# Patient Record
Sex: Female | Born: 1955 | Race: White | Hispanic: No | Marital: Married | Smoking: Former smoker
Health system: Southern US, Community
[De-identification: ages and names within clinical notes are randomized; demographics above are authoritative.]

## PROBLEM LIST (undated history)

## (undated) DIAGNOSIS — R011 Cardiac murmur, unspecified: Secondary | ICD-10-CM

## (undated) DIAGNOSIS — I639 Cerebral infarction, unspecified: Secondary | ICD-10-CM

## (undated) DIAGNOSIS — J449 Chronic obstructive pulmonary disease, unspecified: Secondary | ICD-10-CM

## (undated) DIAGNOSIS — N390 Urinary tract infection, site not specified: Secondary | ICD-10-CM

## (undated) DIAGNOSIS — R0600 Dyspnea, unspecified: Secondary | ICD-10-CM

## (undated) DIAGNOSIS — F32A Depression, unspecified: Secondary | ICD-10-CM

## (undated) DIAGNOSIS — F419 Anxiety disorder, unspecified: Secondary | ICD-10-CM

## (undated) DIAGNOSIS — I10 Essential (primary) hypertension: Secondary | ICD-10-CM

## (undated) DIAGNOSIS — S52501A Unspecified fracture of the lower end of right radius, initial encounter for closed fracture: Secondary | ICD-10-CM

## (undated) DIAGNOSIS — R413 Other amnesia: Secondary | ICD-10-CM

## (undated) DIAGNOSIS — R569 Unspecified convulsions: Secondary | ICD-10-CM

## (undated) HISTORY — PX: CHOLECYSTECTOMY: SHX55

## (undated) HISTORY — PX: NEPHRECTOMY: SHX65

## (undated) HISTORY — PX: BREAST SURGERY: SHX581

## (undated) HISTORY — PX: ABDOMINAL HYSTERECTOMY: SHX81

---

## 2000-09-28 ENCOUNTER — Emergency Department (HOSPITAL_COMMUNITY): Admission: EM | Admit: 2000-09-28 | Discharge: 2000-09-28 | Payer: Self-pay | Admitting: Emergency Medicine

## 2000-09-28 ENCOUNTER — Encounter: Payer: Self-pay | Admitting: Emergency Medicine

## 2002-04-08 ENCOUNTER — Ambulatory Visit (HOSPITAL_COMMUNITY): Admission: RE | Admit: 2002-04-08 | Discharge: 2002-04-08 | Payer: Self-pay | Admitting: Family Medicine

## 2002-04-08 ENCOUNTER — Encounter: Payer: Self-pay | Admitting: Family Medicine

## 2004-06-21 ENCOUNTER — Emergency Department (HOSPITAL_COMMUNITY): Admission: EM | Admit: 2004-06-21 | Discharge: 2004-06-21 | Payer: Self-pay | Admitting: Emergency Medicine

## 2005-01-25 ENCOUNTER — Emergency Department (HOSPITAL_COMMUNITY): Admission: EM | Admit: 2005-01-25 | Discharge: 2005-01-25 | Payer: Self-pay | Admitting: Emergency Medicine

## 2005-06-24 ENCOUNTER — Ambulatory Visit: Payer: Self-pay | Admitting: Internal Medicine

## 2005-06-26 ENCOUNTER — Ambulatory Visit: Payer: Self-pay | Admitting: Internal Medicine

## 2005-06-29 ENCOUNTER — Inpatient Hospital Stay (HOSPITAL_COMMUNITY): Admission: EM | Admit: 2005-06-29 | Discharge: 2005-06-30 | Payer: Self-pay | Admitting: Emergency Medicine

## 2005-06-30 ENCOUNTER — Ambulatory Visit: Payer: Self-pay | Admitting: *Deleted

## 2005-07-29 ENCOUNTER — Ambulatory Visit: Payer: Self-pay | Admitting: Internal Medicine

## 2007-09-13 ENCOUNTER — Emergency Department (HOSPITAL_COMMUNITY): Admission: EM | Admit: 2007-09-13 | Discharge: 2007-09-13 | Payer: Self-pay | Admitting: Emergency Medicine

## 2008-06-09 ENCOUNTER — Encounter (INDEPENDENT_AMBULATORY_CARE_PROVIDER_SITE_OTHER): Payer: Self-pay | Admitting: *Deleted

## 2009-06-28 ENCOUNTER — Inpatient Hospital Stay (HOSPITAL_COMMUNITY): Admission: EM | Admit: 2009-06-28 | Discharge: 2009-07-02 | Payer: Self-pay | Admitting: Emergency Medicine

## 2009-07-26 ENCOUNTER — Emergency Department (HOSPITAL_COMMUNITY): Admission: EM | Admit: 2009-07-26 | Discharge: 2009-07-26 | Payer: Self-pay | Admitting: Emergency Medicine

## 2009-08-20 ENCOUNTER — Ambulatory Visit (HOSPITAL_COMMUNITY): Admission: RE | Admit: 2009-08-20 | Discharge: 2009-08-20 | Payer: Self-pay | Admitting: Pulmonary Disease

## 2009-10-15 ENCOUNTER — Encounter (INDEPENDENT_AMBULATORY_CARE_PROVIDER_SITE_OTHER): Payer: Self-pay | Admitting: Pulmonary Disease

## 2009-10-15 ENCOUNTER — Ambulatory Visit: Payer: Self-pay | Admitting: Cardiology

## 2009-10-15 ENCOUNTER — Ambulatory Visit (HOSPITAL_COMMUNITY): Admission: RE | Admit: 2009-10-15 | Discharge: 2009-10-15 | Payer: Self-pay | Admitting: Pulmonary Disease

## 2010-02-15 ENCOUNTER — Ambulatory Visit (HOSPITAL_COMMUNITY)
Admission: RE | Admit: 2010-02-15 | Discharge: 2010-02-15 | Payer: Self-pay | Source: Home / Self Care | Admitting: Urology

## 2010-03-06 ENCOUNTER — Encounter
Admission: RE | Admit: 2010-03-06 | Discharge: 2010-03-06 | Payer: Self-pay | Source: Home / Self Care | Attending: Neurology | Admitting: Neurology

## 2010-05-06 ENCOUNTER — Emergency Department (HOSPITAL_COMMUNITY)
Admission: EM | Admit: 2010-05-06 | Discharge: 2010-05-06 | Disposition: A | Discharge status: Home / Self Care | Payer: Medicaid Other | Attending: Emergency Medicine | Admitting: Emergency Medicine

## 2010-05-06 ENCOUNTER — Emergency Department (HOSPITAL_COMMUNITY): Payer: Medicaid Other

## 2010-05-06 DIAGNOSIS — J449 Chronic obstructive pulmonary disease, unspecified: Secondary | ICD-10-CM | POA: Insufficient documentation

## 2010-05-06 DIAGNOSIS — Z794 Long term (current) use of insulin: Secondary | ICD-10-CM | POA: Insufficient documentation

## 2010-05-06 DIAGNOSIS — R42 Dizziness and giddiness: Secondary | ICD-10-CM | POA: Insufficient documentation

## 2010-05-06 DIAGNOSIS — R109 Unspecified abdominal pain: Secondary | ICD-10-CM | POA: Insufficient documentation

## 2010-05-06 DIAGNOSIS — Z79899 Other long term (current) drug therapy: Secondary | ICD-10-CM | POA: Insufficient documentation

## 2010-05-06 DIAGNOSIS — E785 Hyperlipidemia, unspecified: Secondary | ICD-10-CM | POA: Insufficient documentation

## 2010-05-06 DIAGNOSIS — R0789 Other chest pain: Secondary | ICD-10-CM | POA: Insufficient documentation

## 2010-05-06 DIAGNOSIS — J4489 Other specified chronic obstructive pulmonary disease: Secondary | ICD-10-CM | POA: Insufficient documentation

## 2010-05-06 LAB — DIFFERENTIAL
Basophils Absolute: 0 10*3/uL (ref 0.0–0.1)
Eosinophils Absolute: 0.1 10*3/uL (ref 0.0–0.7)
Eosinophils Relative: 1 % (ref 0–5)
Lymphocytes Relative: 35 % (ref 12–46)
Monocytes Absolute: 0.4 10*3/uL (ref 0.1–1.0)

## 2010-05-06 LAB — COMPREHENSIVE METABOLIC PANEL
BUN: 12 mg/dL (ref 6–23)
Calcium: 9.7 mg/dL (ref 8.4–10.5)
Glucose, Bld: 103 mg/dL — ABNORMAL HIGH (ref 70–99)
Sodium: 141 mEq/L (ref 135–145)
Total Protein: 5.7 g/dL — ABNORMAL LOW (ref 6.0–8.3)

## 2010-05-06 LAB — LIPASE, BLOOD: Lipase: 23 U/L (ref 11–59)

## 2010-05-06 LAB — CBC
HCT: 40.1 % (ref 36.0–46.0)
MCHC: 34.2 g/dL (ref 30.0–36.0)
Platelets: 161 10*3/uL (ref 150–400)
RDW: 12.7 % (ref 11.5–15.5)

## 2010-05-06 LAB — URINALYSIS, ROUTINE W REFLEX MICROSCOPIC
Bilirubin Urine: NEGATIVE
Ketones, ur: NEGATIVE mg/dL
Nitrite: NEGATIVE
Protein, ur: NEGATIVE mg/dL
Urobilinogen, UA: 0.2 mg/dL (ref 0.0–1.0)

## 2010-05-06 LAB — POCT CARDIAC MARKERS
CKMB, poc: <1 ng/mL — ABNORMAL LOW (ref 1.0–8.0)
Troponin i, poc: <0.05 ng/mL (ref 0.00–0.09)

## 2010-05-08 LAB — URINE CULTURE
Colony Count: NO GROWTH
Culture: NO GROWTH

## 2010-06-04 LAB — BASIC METABOLIC PANEL
CO2: 26 mEq/L (ref 19–32)
Calcium: 9.5 mg/dL (ref 8.4–10.5)
Chloride: 102 mEq/L (ref 96–112)
Creatinine, Ser: 0.7 mg/dL (ref 0.4–1.2)
Creatinine, Ser: 0.79 mg/dL (ref 0.4–1.2)
GFR calc Af Amer: >60 mL/min (ref >60)
Glucose, Bld: 96 mg/dL (ref 70–99)
Sodium: 139 mEq/L (ref 135–145)
Sodium: 133 mEq/L — ABNORMAL LOW (ref 135–145)

## 2010-06-04 LAB — BLOOD GAS, ARTERIAL
Acid-base deficit: 0.3 mmol/L (ref 0.0–2.0)
Bicarbonate: 24 mEq/L (ref 20.0–24.0)
O2 Content: 2 L/min
TCO2: 21.9 mmol/L (ref 0–100)
pCO2 arterial: 40.2 mmHg (ref 35.0–45.0)
pO2, Arterial: 91.8 mmHg (ref 80.0–100.0)

## 2010-06-04 LAB — DIFFERENTIAL
Basophils Absolute: 0 10*3/uL (ref 0.0–0.1)
Basophils Relative: 1 % (ref 0–1)
Eosinophils Absolute: 0 10*3/uL (ref 0.0–0.7)
Monocytes Absolute: 0.5 10*3/uL (ref 0.1–1.0)
Neutro Abs: 3.9 10*3/uL (ref 1.7–7.7)

## 2010-06-04 LAB — GLUCOSE, CAPILLARY
Glucose-Capillary: 127 mg/dL — ABNORMAL HIGH (ref 70–99)
Glucose-Capillary: 174 mg/dL — ABNORMAL HIGH (ref 70–99)
Glucose-Capillary: 255 mg/dL — ABNORMAL HIGH (ref 70–99)
Glucose-Capillary: 265 mg/dL — ABNORMAL HIGH (ref 70–99)
Glucose-Capillary: 276 mg/dL — ABNORMAL HIGH (ref 70–99)
Glucose-Capillary: 324 mg/dL — ABNORMAL HIGH (ref 70–99)
Glucose-Capillary: 94 mg/dL (ref 70–99)
Glucose-Capillary: 96 mg/dL (ref 70–99)

## 2010-06-04 LAB — URINALYSIS, ROUTINE W REFLEX MICROSCOPIC
Glucose, UA: NEGATIVE mg/dL
Hgb urine dipstick: NEGATIVE
Specific Gravity, Urine: 1.01 (ref 1.005–1.030)
pH: 5.5 (ref 5.0–8.0)

## 2010-06-04 LAB — CARDIAC PANEL(CRET KIN+CKTOT+MB+TROPI)
Total CK: 42 U/L (ref 7–177)
Troponin I: <0.01 ng/mL (ref 0.00–0.06)

## 2010-06-04 LAB — CBC
Hemoglobin: 13 g/dL (ref 12.0–15.0)
MCHC: 35.5 g/dL (ref 30.0–36.0)
RDW: 13.7 % (ref 11.5–15.5)

## 2010-06-05 LAB — GLUCOSE, CAPILLARY
Glucose-Capillary: 277 mg/dL — ABNORMAL HIGH (ref 70–99)
Glucose-Capillary: 501 mg/dL — ABNORMAL HIGH (ref 70–99)

## 2010-06-05 LAB — URINALYSIS, ROUTINE W REFLEX MICROSCOPIC
Glucose, UA: >1000 mg/dL — AB
Hgb urine dipstick: NEGATIVE
Specific Gravity, Urine: 1.01 (ref 1.005–1.030)
Urobilinogen, UA: 0.2 mg/dL (ref 0.0–1.0)
pH: 5 (ref 5.0–8.0)

## 2010-06-05 LAB — POCT CARDIAC MARKERS
CKMB, poc: <1 ng/mL — ABNORMAL LOW (ref 1.0–8.0)
Myoglobin, poc: 110 ng/mL (ref 12–200)
Troponin i, poc: <0.05 ng/mL (ref 0.00–0.09)

## 2010-06-05 LAB — URINE CULTURE

## 2010-06-05 LAB — CARDIAC PANEL(CRET KIN+CKTOT+MB+TROPI)
CK, MB: 1.5 ng/mL (ref 0.3–4.0)
Relative Index: INVALID (ref 0.0–2.5)
Total CK: 37 U/L (ref 7–177)
Troponin I: 0.02 ng/mL (ref 0.00–0.06)

## 2010-06-05 LAB — CBC
MCHC: 36 g/dL (ref 30.0–36.0)
RBC: 4.86 MIL/uL (ref 3.87–5.11)

## 2010-06-05 LAB — BASIC METABOLIC PANEL
CO2: 21 mEq/L (ref 19–32)
Calcium: 9.7 mg/dL (ref 8.4–10.5)
Creatinine, Ser: 1.27 mg/dL — ABNORMAL HIGH (ref 0.4–1.2)
GFR calc Af Amer: 53 mL/min — ABNORMAL LOW (ref >60)

## 2010-06-05 LAB — DIFFERENTIAL
Lymphs Abs: 1.5 10*3/uL (ref 0.7–4.0)
Monocytes Absolute: 0.1 10*3/uL (ref 0.1–1.0)
Monocytes Relative: 1 % — ABNORMAL LOW (ref 3–12)
Neutro Abs: 10.9 10*3/uL — ABNORMAL HIGH (ref 1.7–7.7)
Neutrophils Relative %: 87 % — ABNORMAL HIGH (ref 43–77)

## 2010-06-05 LAB — BLOOD GAS, ARTERIAL
Acid-base deficit: 4.1 mmol/L — ABNORMAL HIGH (ref 0.0–2.0)
Bicarbonate: 21 mEq/L (ref 20.0–24.0)
TCO2: 12.3 mmol/L (ref 0–100)
pCO2 arterial: 41.6 mmHg (ref 35.0–45.0)
pO2, Arterial: 33 mmHg — CL (ref 80.0–100.0)

## 2010-06-05 LAB — CULTURE, BLOOD (ROUTINE X 2)
Culture: NO GROWTH
Culture: NO GROWTH

## 2010-06-05 LAB — URINE MICROSCOPIC-ADD ON

## 2010-06-05 LAB — HEMOGLOBIN A1C: Hgb A1c MFr Bld: 11.1 % — ABNORMAL HIGH (ref <5.7)

## 2010-08-02 NOTE — Discharge Summary (Signed)
[name], Veronica Moses               ACCOUNT NO.:  [id number]   MEDICAL RECORD NO.:  [id number]          PATIENT TYPE:  INP   LOCATION:  A222                          FACILITY:  [facility]   PHYSICIAN:  Edward L. Juanetta Gosling, M.D.DATE OF BIRTH:  November 19, 1955   DATE OF ADMISSION:  06/28/2005  DATE OF DISCHARGE:  04/16/2007LH                                 DISCHARGE SUMMARY   FINAL DISCHARGE DIAGNOSES:  1.  Chest pain, myocardial infarction ruled out.  2.  Hypertension.  3.  Anxiety.  4.  History of nephrectomy.   HISTORY:  Veronica Moses is a 55 year old who has had significant problems with  chest pain for the past several months.  She has seen Dr. Gala Moses of the  [facility] cardiology service, and is being scheduled for a stress test versus  a cardiac catheterization, but continued to have chest pain and came to the  [facility] emergency room, from where she was admitted.  Her chest pain is  somewhat atypical, but she does have left arm pain associated with it.   PHYSICAL EXAMINATION:  GENERAL:  A well-developed, well-nourished female who  is in no acute distress.  CHEST:  Clear.  HEART:  Regular.  ABDOMEN:  Soft.  EXTREMITIES:  No edema.   HOSPITAL COURSE:  She had cardiac enzymes that ruled out myocardial  infarction.  She had a Veronica Moses cardiology consultation and underwent a  stress Myoview which was without any evidence of ischemia, and she was,  therefore, discharged home.  Dr. Dietrich Moses suggested that she have Norvasc 5  mg added, and I have added that.   DISCHARGE MEDICATIONS:  1.  Aspirin 81 mg daily.  2.  Lopressor 25 mg b.i.d.  3.  Seroquel 100 mg daily.  4.  Xanax 0.5 b.i.d. as needed.  5.  Norvasc 5 mg daily.   DATA REVIEWED:  She is going to follow up in my office and in the [facility]  cardiology office.      Edward L. Juanetta Gosling, M.D.  Electronically Signed     ELH/MEDQ  D:  06/30/2005  T:  07/01/2005  Job:  [id number]

## 2010-08-02 NOTE — H&P (Signed)
Veronica Moses, Veronica Moses               ACCOUNT NO.:  [id number]   MEDICAL RECORD NO.:  [id number]          PATIENT TYPE:  INP   LOCATION:  A222                          FACILITY:  [facility]   PHYSICIAN:  Edward L. Juanetta Gosling, M.D.DATE OF BIRTH:  1955/04/17   DATE OF ADMISSION:  06/28/2005  DATE OF DISCHARGE:  LH                                HISTORY & PHYSICAL   REASON FOR ADMISSION:  Chest pain.   Veronica Moses is a 55 year old who has had significant problems with chest pain  over the last several weeks.  She has been evaluated at the [facility]  Cardiology office by Dr. Gala Romney and is being worked up apparently with a  stress and potential for a cardiac catheterization, but she has only one  kidney, so we are trying to avoid cardiac cath.  She developed chest pain  that felt like a pressure on her chest.  She was sick at her stomach.  She  had diaphoresis, and she came to the emergency room for evaluation.  When  she was seen in the emergency room, she underwent evaluation.  Had no  definite evidence of acute MI.  She was admitted to rule out MI and for  further cardiac workup.  Her pain was in the left chest in the middle of the  chest, described as a pressure.  It goes into her left arm and left side of  her neck.  She had felt it was about a 7/10.  When she came to the emergency  room, she was given nitroglycerin, and she did have some relief.   PAST MEDICAL HISTORY:  1.  Hypertension.  2.  Anxiety.  3.  Depression.   MEDICATIONS:  1.  Lopressor 25 mg b.i.d.  2.  Seroquel 100 mg at bedtime.  3.  Aspirin 81 mg daily.  4.  Xanax 0.25 mg as needed.   SOCIAL HISTORY:  She has about a 30 pack year smoking history, has continued  to smoke cigarettes.  She says that she has been under a lot of stress.   FAMILY HISTORY:  Positive for coronary artery disease.  It is unclear of the  exact extent of that.   PHYSICAL EXAMINATION:  VITAL SIGNS:  Temperature 98.2, pulse 68,  respirations 18,  blood pressure 141/65.  Height 61 inches.  Weight 139.  GENERAL:  A well-developed and well-nourished female who is in no acute  distress now.  She says that her pain has resolved.  HEENT:  Pupils are reactive.  Nose and throat are clear.  NECK:  Neck is supple without masses.  CHEST:  Clear without wheezes, rales or rhonchi.  HEART:  Regular.  She has no gallops, no murmurs.  ABDOMEN:  Soft.  Minimally obese.  No masses are felt.  EXTREMITIES:  No edema.  CNS:  Grossly intact.   Her white blood cell count is 7200, hemoglobin 13.3.  Electrolytes are  normal.  Point-of-care cardiac enzymes are negative.   ASSESSMENT:  She has chest pain and is not clear whether she has coronary  disease or not.  She wants  to go home, but I think she should stay here and  lets go ahead and get this, since she has been to the emergency room four  times in the last several months, try to get this resolved.  She has not had  three sets of cardiac enzymes yet anyway, so she is not fully ruled out as  far as a myocardial infarction is concerned, and it is not clear exactly  what is going on with her.      Edward L. Juanetta Gosling, M.D.  Electronically Signed     ELH/MEDQ  D:  06/29/2005  T:  06/29/2005  Job:  [id number]

## 2010-12-12 LAB — URINALYSIS, ROUTINE W REFLEX MICROSCOPIC
Glucose, UA: NEGATIVE
Protein, ur: NEGATIVE
Urobilinogen, UA: 0.2

## 2010-12-12 LAB — RAPID URINE DRUG SCREEN, HOSP PERFORMED: Cocaine: NOT DETECTED

## 2010-12-12 LAB — BASIC METABOLIC PANEL
Chloride: 106
GFR calc Af Amer: 55 — ABNORMAL LOW
Potassium: 4
Sodium: 141

## 2010-12-12 LAB — CBC
HCT: 41.4
Hemoglobin: 14.5
MCV: 89
RBC: 4.65
WBC: 8.5

## 2010-12-12 LAB — ETHANOL: Alcohol, Ethyl (B): <5

## 2010-12-12 LAB — URINE MICROSCOPIC-ADD ON

## 2011-02-05 ENCOUNTER — Other Ambulatory Visit (HOSPITAL_COMMUNITY): Payer: Self-pay | Admitting: Pulmonary Disease

## 2011-02-05 DIAGNOSIS — Z139 Encounter for screening, unspecified: Secondary | ICD-10-CM

## 2011-02-11 ENCOUNTER — Ambulatory Visit (HOSPITAL_COMMUNITY)
Admission: RE | Admit: 2011-02-11 | Discharge: 2011-02-11 | Disposition: A | Discharge status: Home / Self Care | Payer: Self-pay | Source: Ambulatory Visit | Attending: Pulmonary Disease | Admitting: Pulmonary Disease

## 2011-02-11 DIAGNOSIS — Z139 Encounter for screening, unspecified: Secondary | ICD-10-CM

## 2011-02-11 DIAGNOSIS — Z1231 Encounter for screening mammogram for malignant neoplasm of breast: Secondary | ICD-10-CM | POA: Insufficient documentation

## 2011-05-27 ENCOUNTER — Emergency Department (HOSPITAL_COMMUNITY)
Admission: EM | Admit: 2011-05-27 | Discharge: 2011-05-27 | Disposition: A | Discharge status: Home Health | Payer: Self-pay | Attending: Emergency Medicine | Admitting: Emergency Medicine

## 2011-05-27 ENCOUNTER — Other Ambulatory Visit: Payer: Self-pay

## 2011-05-27 ENCOUNTER — Encounter (HOSPITAL_COMMUNITY): Payer: Self-pay

## 2011-05-27 DIAGNOSIS — E162 Hypoglycemia, unspecified: Secondary | ICD-10-CM

## 2011-05-27 DIAGNOSIS — R109 Unspecified abdominal pain: Secondary | ICD-10-CM | POA: Insufficient documentation

## 2011-05-27 DIAGNOSIS — R634 Abnormal weight loss: Secondary | ICD-10-CM | POA: Insufficient documentation

## 2011-05-27 DIAGNOSIS — R0602 Shortness of breath: Secondary | ICD-10-CM | POA: Insufficient documentation

## 2011-05-27 DIAGNOSIS — R209 Unspecified disturbances of skin sensation: Secondary | ICD-10-CM | POA: Insufficient documentation

## 2011-05-27 DIAGNOSIS — E1169 Type 2 diabetes mellitus with other specified complication: Secondary | ICD-10-CM | POA: Insufficient documentation

## 2011-05-27 LAB — BASIC METABOLIC PANEL
GFR calc Af Amer: 76 mL/min — ABNORMAL LOW (ref >90)
GFR calc non Af Amer: 66 mL/min — ABNORMAL LOW (ref >90)
Potassium: 3.3 mEq/L — ABNORMAL LOW (ref 3.5–5.1)
Sodium: 129 mEq/L — ABNORMAL LOW (ref 135–145)

## 2011-05-27 LAB — DIFFERENTIAL
Basophils Relative: 0 % (ref 0–1)
Eosinophils Absolute: 0.1 10*3/uL (ref 0.0–0.7)
Neutrophils Relative %: 72 % (ref 43–77)

## 2011-05-27 LAB — CBC
MCH: 31.2 pg (ref 26.0–34.0)
MCHC: 35.5 g/dL (ref 30.0–36.0)
Platelets: 139 10*3/uL — ABNORMAL LOW (ref 150–400)
RDW: 12.8 % (ref 11.5–15.5)

## 2011-05-27 LAB — GLUCOSE, CAPILLARY: Glucose-Capillary: 152 mg/dL — ABNORMAL HIGH (ref 70–99)

## 2011-05-27 MED ORDER — DEXTROSE 50 % IV SOLN
INTRAVENOUS | Status: AC
  Filled 2011-05-27: qty 50

## 2011-05-27 MED ORDER — SODIUM CHLORIDE 0.9 % IV BOLUS (SEPSIS): 500.0000 mL | Freq: Once | INTRAVENOUS | Status: DC

## 2011-05-27 MED ORDER — DEXTROSE 50 % IV SOLN: 25.0000 g | Freq: Once | INTRAVENOUS | Status: DC

## 2011-05-27 NOTE — ED Provider Notes (Signed)
History  Scribed for Veronica Gaskins, MD, the patient was seen in room AP[facility]3/AP[facility]3. This chart was scribed by Candelaria Stagers. The patient's care started at 4:47 PM    CSN: [id number]  Arrival date & time 05/27/11  1629   First MD Initiated Contact with Patient 05/27/11 1631      Chief Complaint  Patient presents with  . Numbness     The history is provided by the patient.   Veronica Moses is a 56 y.o. female who presents to the Emergency Department complaining of numbness of the legs and arms that started about two hours ago.  Pt states that this morning her blood sugar levels were high this morning around 640 while at home and then came down to 200's.  Shortly after she began to experience numbness in the legs and arms.  She is also experiencing SOB (chronic from COPD), abdominal pain, and recent weight loss.  She states that before today she has felt fine.  She denies headache, fever, chest pain, vomiting, or diarrhea.  Pt has experienced similar sx when first diagnosed with diabetes.  She has h/o diabetes and COPD.  Her PCP is Dr. Juanetta Gosling.  She has had no changes to medications.  She was diagnosed with shingles last week by Dr. Juanetta Gosling and has finished the medications for this.   PMH - diabetes  No past surgical history on file.  No family history on file.  History  Substance Use Topics  . Smoking status: Not on file  . Smokeless tobacco: Not on file  . Alcohol Use: Not on file    OB History    No data available      Review of Systems  Constitutional: Positive for unexpected weight change. Negative for fever.  Respiratory: Positive for shortness of breath. Negative for cough.   Gastrointestinal: Positive for abdominal pain. Negative for vomiting and diarrhea.  Neurological: Positive for numbness (arms and legs bilaterally). Negative for syncope.  All other systems reviewed and are negative.    Allergies  Demerol  Home Medications  No current outpatient  prescriptions on file.  BP 121/60  Pulse 84  Temp(Src) 98.4 F (36.9 C) (Oral)  Resp 15  SpO2 95%  Physical Exam CONSTITUTIONAL: Well developed/well nourished HEAD AND FACE: Normocephalic/atraumatic EYES: EOMI/PERRL ENMT: Mucous membranes moist NECK: supple no meningeal signs SPINE:entire spine nontender CV: S1/S2 noted, no murmurs/rubs/gallops noted LUNGS: Lungs are clear to auscultation bilaterally, no apparent distress ABDOMEN: soft, nontender, no rebound or guarding GU:no cva tenderness NEURO: Equal power with hand grip, wrist flex/extension, elbow flex/extension, and equal power with shoulder abduction/adduction.    Equal biceps/brachioradial reflex in bilateral UE  equal distal motor: hip flexion/knee flexion/extension, ankle dorsi/plantar flexion, great toe extension intact bilaterally,Equal patellar/achilles reflex noted.   Face is symmetric No cranial nerve deficit noted EXTREMITIES: pulses normal, full ROM SKIN: warm, color normal PSYCH: no abnormalities of mood noted  ED Course  Procedures   DIAGNOSTIC STUDIES: Oxygen Saturation is 95% on room air, normal by my interpretation.    COORDINATION OF CARE:  4:54PM Ordered: CBC ; Differential ; Basic metabolic panel ; sodium chloride 0.9 % bolus 500 mL ; ED EKG  Pt reports numbness to all extremities She has equal motor strength in all extremities, but reports "tingling" Will check labs and reassess Has chronic back pain that is not new No incontinence reported No signs of CVA at this time  5:47 PM Hypoglycemia noted Treatment ordered  5:50 PM pt states  that she took 11 units of novolog (half life ) around 1:45PM   Pt improved, taking PO, ambulatory, no focal weakness, reports numbness improved  I asked her to hold all insulin tonight (supposed to take lantus) and call her PCP tomorrow for further instructions on dosing  The patient appears reasonably screened and/or stabilized for discharge and I  doubt any other medical condition or other [facility] requiring further screening, evaluation, or treatment in the ED at this time prior to discharge.   MDM  Nursing notes reviewed and considered in documentation All labs/vitals reviewed and considered     Date: 05/27/2011  Rate: 74  Rhythm: normal sinus rhythm  QRS Axis: normal  Intervals: normal  ST/T Wave abnormalities: normal  Conduction Disutrbances:none  Narrative Interpretation:   Old EKG Reviewed: unchanged     I personally performed the services described in this documentation, which was scribed in my presence. The recorded information has been reviewed and considered.           Veronica Gaskins, MD 05/27/11 2125

## 2011-05-27 NOTE — ED Notes (Signed)
Pt ambulated in hallway with minimal assistance. Denies dizziness or numbness. C/o "bottom of my feet feel sore"

## 2011-05-27 NOTE — ED Notes (Signed)
Pt complain of numbness to feet and legs

## 2011-05-27 NOTE — Discharge Instructions (Signed)
Low Blood Sugar Low blood sugar (hypoglycemia) means that the level of sugar in your blood is lower than it should be. Signs of low blood sugar include:  Getting sweaty.   Feeling hungry.   Feeling dizzy or weak.   Feeling sleepier than normal.   Feeling nervous.   Headaches.   Having a fast heartbeat.  Low blood sugar can happen fast and can be an emergency. Your doctor can do tests to check your blood sugar level. You can have low blood sugar and not have diabetes. HOME CARE  Check your blood sugar as told by your doctor. If it is less than 70 mg/dl or as told by your doctor, take 1 of the following:   3 to 4 glucose tablets.    cup clear juice.    cup soda pop, not diet.   1 cup milk.   5 to 6 hard candies.   Recheck blood sugar after 15 minutes. Repeat until it is at the right level.   Eat a snack if it is more than 1 hour until the next meal.   Only take medicine as told by your doctor.   Do not skip meals. Eat on time.   Do not drink alcohol except with meals.   Check your blood glucose before driving.   Check your blood glucose before and after exercise.   Always carry treatment with you, such as glucose pills.   Always wear a medical alert bracelet if you have diabetes.  GET HELP RIGHT AWAY IF:   Your blood glucose goes below 70 mg/dl or as told by your doctor, and you:   Are confused.   Are not able to swallow.   Pass out (faint).   You cannot treat yourself. You may need someone to help you.   You have low blood sugar problems often.   You have problems from your medicines.   You are not feeling better after 3 to 4 days.   You have vision changes.  MAKE SURE YOU:   Understand these instructions.   Will watch this condition.   Will get help right away if you are not doing well or get worse.  Document Released: 05/28/2009 Document Revised: 02/20/2011 Document Reviewed: 05/28/2009 [employer] Patient Information 2012 [employer]. 

## 2011-08-25 ENCOUNTER — Ambulatory Visit (HOSPITAL_COMMUNITY)
Admission: RE | Admit: 2011-08-25 | Discharge: 2011-08-25 | Disposition: A | Discharge status: Home / Self Care | Payer: Self-pay | Source: Ambulatory Visit | Attending: Pulmonary Disease | Admitting: Pulmonary Disease

## 2011-08-25 ENCOUNTER — Other Ambulatory Visit (HOSPITAL_COMMUNITY): Payer: Self-pay | Admitting: Pulmonary Disease

## 2011-08-25 DIAGNOSIS — R079 Chest pain, unspecified: Secondary | ICD-10-CM

## 2011-08-25 DIAGNOSIS — R05 Cough: Secondary | ICD-10-CM | POA: Insufficient documentation

## 2011-08-25 DIAGNOSIS — R059 Cough, unspecified: Secondary | ICD-10-CM | POA: Insufficient documentation

## 2011-08-25 DIAGNOSIS — E119 Type 2 diabetes mellitus without complications: Secondary | ICD-10-CM | POA: Insufficient documentation

## 2012-04-22 ENCOUNTER — Encounter (HOSPITAL_COMMUNITY): Payer: Self-pay | Admitting: *Deleted

## 2012-04-22 ENCOUNTER — Emergency Department (HOSPITAL_COMMUNITY): Payer: Self-pay

## 2012-04-22 ENCOUNTER — Emergency Department (HOSPITAL_COMMUNITY)
Admission: EM | Admit: 2012-04-22 | Discharge: 2012-04-23 | Disposition: A | Discharge status: Home / Self Care | Payer: Self-pay | Attending: Emergency Medicine | Admitting: Emergency Medicine

## 2012-04-22 DIAGNOSIS — J441 Chronic obstructive pulmonary disease with (acute) exacerbation: Secondary | ICD-10-CM | POA: Insufficient documentation

## 2012-04-22 DIAGNOSIS — R51 Headache: Secondary | ICD-10-CM | POA: Insufficient documentation

## 2012-04-22 DIAGNOSIS — R112 Nausea with vomiting, unspecified: Secondary | ICD-10-CM | POA: Insufficient documentation

## 2012-04-22 DIAGNOSIS — R059 Cough, unspecified: Secondary | ICD-10-CM | POA: Insufficient documentation

## 2012-04-22 DIAGNOSIS — Z79899 Other long term (current) drug therapy: Secondary | ICD-10-CM | POA: Insufficient documentation

## 2012-04-22 DIAGNOSIS — R05 Cough: Secondary | ICD-10-CM | POA: Insufficient documentation

## 2012-04-22 DIAGNOSIS — IMO0001 Reserved for inherently not codable concepts without codable children: Secondary | ICD-10-CM | POA: Insufficient documentation

## 2012-04-22 DIAGNOSIS — E1139 Type 2 diabetes mellitus with other diabetic ophthalmic complication: Secondary | ICD-10-CM | POA: Insufficient documentation

## 2012-04-22 DIAGNOSIS — Z794 Long term (current) use of insulin: Secondary | ICD-10-CM | POA: Insufficient documentation

## 2012-04-22 DIAGNOSIS — J449 Chronic obstructive pulmonary disease, unspecified: Secondary | ICD-10-CM

## 2012-04-22 DIAGNOSIS — H539 Unspecified visual disturbance: Secondary | ICD-10-CM | POA: Insufficient documentation

## 2012-04-22 DIAGNOSIS — J029 Acute pharyngitis, unspecified: Secondary | ICD-10-CM | POA: Insufficient documentation

## 2012-04-22 DIAGNOSIS — F17200 Nicotine dependence, unspecified, uncomplicated: Secondary | ICD-10-CM | POA: Insufficient documentation

## 2012-04-22 DIAGNOSIS — R300 Dysuria: Secondary | ICD-10-CM | POA: Insufficient documentation

## 2012-04-22 DIAGNOSIS — R509 Fever, unspecified: Secondary | ICD-10-CM | POA: Insufficient documentation

## 2012-04-22 DIAGNOSIS — J40 Bronchitis, not specified as acute or chronic: Secondary | ICD-10-CM

## 2012-04-22 HISTORY — DX: Chronic obstructive pulmonary disease, unspecified: J44.9

## 2012-04-22 LAB — BASIC METABOLIC PANEL
Calcium: 10.4 mg/dL (ref 8.4–10.5)
GFR calc Af Amer: >90 mL/min (ref >90)
GFR calc non Af Amer: >90 mL/min (ref >90)
Glucose, Bld: 152 mg/dL — ABNORMAL HIGH (ref 70–99)
Sodium: 135 mEq/L (ref 135–145)

## 2012-04-22 LAB — CBC WITH DIFFERENTIAL/PLATELET
Basophils Absolute: 0 10*3/uL (ref 0.0–0.1)
Basophils Relative: 0 % (ref 0–1)
Eosinophils Absolute: 0.1 10*3/uL (ref 0.0–0.7)
Eosinophils Relative: 1 % (ref 0–5)
HCT: 35.3 % — ABNORMAL LOW (ref 36.0–46.0)
Hemoglobin: 12.3 g/dL (ref 12.0–15.0)
Lymphocytes Relative: 24 % (ref 12–46)
Lymphs Abs: 2.7 10*3/uL (ref 0.7–4.0)
MCH: 31.2 pg (ref 26.0–34.0)
MCHC: 34.8 g/dL (ref 30.0–36.0)
MCV: 89.6 fL (ref 78.0–100.0)
Monocytes Absolute: 0.8 10*3/uL (ref 0.1–1.0)
Monocytes Relative: 7 % (ref 3–12)
Neutro Abs: 7.9 10*3/uL — ABNORMAL HIGH (ref 1.7–7.7)
Neutrophils Relative %: 68 % (ref 43–77)
Platelets: 445 10*3/uL — ABNORMAL HIGH (ref 150–400)
RBC: 3.94 MIL/uL (ref 3.87–5.11)
RDW: 12.2 % (ref 11.5–15.5)
WBC: 11.5 10*3/uL — ABNORMAL HIGH (ref 4.0–10.5)

## 2012-04-22 LAB — PRO B NATRIURETIC PEPTIDE: Pro B Natriuretic peptide (BNP): 92.7 pg/mL (ref 0–125)

## 2012-04-22 LAB — GLUCOSE, CAPILLARY: Glucose-Capillary: 146 mg/dL — ABNORMAL HIGH (ref 70–99)

## 2012-04-22 MED ORDER — ALBUTEROL SULFATE (5 MG/ML) 0.5% IN NEBU
2.5000 mg | INHALATION_SOLUTION | Freq: Once | RESPIRATORY_TRACT | Status: AC
  Administered 2012-04-22: 2.5 mg via RESPIRATORY_TRACT
  Filled 2012-04-22: qty 0.5

## 2012-04-22 MED ORDER — ONDANSETRON HCL 4 MG/2ML IJ SOLN
4.0000 mg | Freq: Once | INTRAMUSCULAR | Status: AC
  Administered 2012-04-22: 4 mg via INTRAVENOUS
  Filled 2012-04-22: qty 2

## 2012-04-22 MED ORDER — SODIUM CHLORIDE 0.9 % IV BOLUS (SEPSIS)
250.0000 mL | Freq: Once | INTRAVENOUS | Status: AC
  Administered 2012-04-22: 21:00:00 via INTRAVENOUS

## 2012-04-22 MED ORDER — PREDNISONE 50 MG PO TABS
60.0000 mg | ORAL_TABLET | Freq: Once | ORAL | Status: AC
  Administered 2012-04-22: 60 mg via ORAL
  Filled 2012-04-22: qty 1

## 2012-04-22 MED ORDER — SODIUM CHLORIDE 0.9 % IV SOLN: INTRAVENOUS | Status: DC

## 2012-04-22 MED ORDER — PREDNISONE 10 MG PO TABS: 20.0000 mg | ORAL_TABLET | Freq: Every day | ORAL | Status: DC

## 2012-04-22 MED ORDER — HYDROMORPHONE HCL PF 1 MG/ML IJ SOLN
1.0000 mg | Freq: Once | INTRAMUSCULAR | Status: AC
  Administered 2012-04-22: 1 mg via INTRAVENOUS
  Filled 2012-04-22: qty 1

## 2012-04-22 MED ORDER — LORAZEPAM 2 MG/ML IJ SOLN
INTRAMUSCULAR | Status: AC
  Administered 2012-04-22: 1 mg
  Filled 2012-04-22: qty 1

## 2012-04-22 MED ORDER — HYDROMORPHONE HCL PF 1 MG/ML IJ SOLN
1.0000 mg | Freq: Once | INTRAMUSCULAR | Status: DC
  Filled 2012-04-22: qty 1

## 2012-04-22 MED ORDER — IPRATROPIUM BROMIDE 0.02 % IN SOLN
0.5000 mg | Freq: Once | RESPIRATORY_TRACT | Status: AC
  Administered 2012-04-22: 0.5 mg via RESPIRATORY_TRACT
  Filled 2012-04-22: qty 2.5

## 2012-04-22 MED ORDER — ALBUTEROL SULFATE HFA 108 (90 BASE) MCG/ACT IN AERS
2.0000 | INHALATION_SPRAY | Freq: Four times a day (QID) | RESPIRATORY_TRACT | Status: DC
  Filled 2012-04-22: qty 6.7

## 2012-04-22 NOTE — ED Notes (Signed)
Intermittent lt chest pain for 2 weeks, worse for last hour, Found lying in floor by husband, says she was weak.

## 2012-04-22 NOTE — ED Provider Notes (Signed)
History  This chart was scribed for [name], MD by Bennett Scrape, ED Scribe. This patient was seen in room APA19/APA19 and the patient's care was started at 7:58 PM.  CSN: [id number]  Arrival date & time 04/22/12  7829   First MD Initiated Contact with Patient 04/22/12 1958      Chief Complaint  Patient presents with  . Chest Pain    Patient is a 57 y.o. female presenting with chest pain. The history is provided by the patient. No language interpreter was used.  Chest Pain The chest pain began 1 - 2 weeks ago. Duration of episode(s) is 20 minutes. Chest pain occurs intermittently. The chest pain is worsening. The pain does not radiate. Primary symptoms include a fever, shortness of breath, cough, nausea and vomiting. Pertinent negatives for primary symptoms include no abdominal pain.  Her past medical history is significant for COPD and diabetes.  Pertinent negatives for past medical history include no CAD and no MI.    Veronica Moses is a 57 y.o. female who presents to the Emergency Department complaining of intermittent left sternal CP for the past 2 weeks that became constant and worse 1.5 hours ago with associated two weeks of fever, cough productive of green phlegm, mild SOB, decreased appetite, nausea, sore throat, mild dysuria, HA and emesis. She reports that the episodes would last 20 to 30 minutes and that the CP would be worse with coughing. She states that she has f/u with Dr. Juanetta Gosling twice and was started on two antibiotics, one two weeks ago and another yesterday, with no improvement. She denies diarrhea, congestion, abdominal pain and neck pain as associated symptoms. She has a h/o COPD and states that she ran out of her symbicort inhaler 3 days ago and has been unable to get a refill. She has a h/o DM (takes insulin) and is a current everyday smoker but denies alcohol use.  Past Medical History  Diagnosis Date  . Diabetes mellitus   . COPD (chronic obstructive  pulmonary disease)     Past Surgical History  Procedure Date  . Abdominal hysterectomy   . Nephrectomy   . Cholecystectomy   . Breast surgery     History reviewed. No pertinent family history.  History  Substance Use Topics  . Smoking status: Current Every Day Smoker  . Smokeless tobacco: Not on file  . Alcohol Use: No    No OB history provided.  Review of Systems  Constitutional: Positive for fever and appetite change. Negative for chills.  HENT: Positive for sore throat. Negative for congestion, rhinorrhea and neck pain.   Eyes: Positive for visual disturbance (felt intermittently, associated with DM, denies changes).  Respiratory: Positive for cough and shortness of breath.   Cardiovascular: Positive for chest pain.  Gastrointestinal: Positive for nausea and vomiting. Negative for abdominal pain and diarrhea.  Genitourinary: Positive for dysuria. Negative for frequency.  Musculoskeletal: Positive for myalgias. Negative for back pain.  Skin: Negative for rash.  Neurological: Positive for headaches.  Hematological: Does not bruise/bleed easily.  All other systems reviewed and are negative.    Allergies  Demerol  Home Medications   Current Outpatient Rx  Name  Route  Sig  Dispense  Refill  . ALPRAZOLAM 1 MG PO TABS   Oral   Take 1 mg by mouth 3 (three) times daily as needed. For anxiety         . BUDESONIDE-FORMOTEROL FUMARATE 160-4.5 MCG/ACT IN AERO   Inhalation  Inhale 2 puffs into the lungs 2 (two) times daily.         . CEPHALEXIN 500 MG PO CAPS   Oral   Take 500 mg by mouth 3 (three) times daily.         . INSULIN ASPART 100 UNIT/ML [place] SOLN   Subcutaneous   Inject 3-15 Units into the skin 3 (three) times daily before meals. As directed per sliding scale         . INSULIN GLARGINE 100 UNIT/ML [place] SOLN   Subcutaneous   Inject 14 Units into the skin at bedtime.         . OXYCODONE-ACETAMINOPHEN 5-325 MG PO TABS   Oral   Take 1 tablet by  mouth 4 (four) times daily.          Marland Kitchen PREDNISONE 10 MG PO TABS   Oral   Take 2 tablets (20 mg total) by mouth daily.   10 tablet   0     Triage Vitals: BP 133/50  Pulse 94  Temp 97.9 F (36.6 C) (Oral)  Resp 18  Ht 5\' 1"  (1.549 m)  Wt 92 lb (41.731 kg)  BMI 17.38 kg/m2  SpO2 100%  Physical Exam  Nursing note and vitals reviewed. Constitutional: She is oriented to person, place, and time. She appears well-developed and well-nourished. No distress.  HENT:  Head: Normocephalic and atraumatic.       Moist MM  Eyes: Conjunctivae normal and EOM are normal.  Neck: Neck supple. No tracheal deviation present.  Cardiovascular: Normal rate and regular rhythm.   No murmur heard. Pulmonary/Chest: Effort normal. No respiratory distress. She has no wheezes.       Bilateral rhonchi   Abdominal: Soft. Bowel sounds are normal. There is no tenderness.  Musculoskeletal: Normal range of motion.  Neurological: She is alert and oriented to person, place, and time. No cranial nerve deficit.       Pt able to move both sets of fingers and toes  Skin: Skin is warm and dry.  Psychiatric: She has a normal mood and affect. Her behavior is normal.    ED Course  Procedures (including critical care time)  DIAGNOSTIC STUDIES: Oxygen Saturation is 100% on 2L O2, normal by my interpretation.    COORDINATION OF CARE: 8:31 PM- Discussed treatment plan which includes breathing treatment, CXR, CBC panel and UA with pt at bedside and pt agreed to plan.   8:45 PM- Ordered 250 mL of bolus, 1 mg of dilaudid injection, 4 mg Zofran injection, 2.5 mg of 0.5% albuterol nebulizer solution and 0.5 mg of Atrovent solution.  Labs Reviewed  BASIC METABOLIC PANEL - Abnormal; Notable for the following:    Glucose, Bld 152 (*)     All other components within normal limits  GLUCOSE, CAPILLARY - Abnormal; Notable for the following:    Glucose-Capillary 146 (*)     All other components within normal limits  CBC  WITH DIFFERENTIAL - Abnormal; Notable for the following:    WBC 11.5 (*)     HCT 35.3 (*)     Platelets 445 (*)     Neutro Abs 7.9 (*)     All other components within normal limits  GLUCOSE, CAPILLARY - Abnormal; Notable for the following:    Glucose-Capillary 158 (*)     All other components within normal limits  PRO B NATRIURETIC PEPTIDE  TROPONIN I   Dg Chest 2 View  04/22/2012  *RADIOLOGY REPORT*  Clinical Data:  Left side chest pain.  CHEST - 2 VIEW  Comparison: PA and lateral chest 08/25/2011 91 11.  Findings: Lungs are clear.  No pneumothorax or pleural effusion. Heart size normal.  IMPRESSION: No acute disease.   Original Report Authenticated By: Holley Dexter, M.D.    Results for orders placed during the hospital encounter of 04/22/12  PRO B NATRIURETIC PEPTIDE      Component Value Range   Pro B Natriuretic peptide (BNP) 92.7  0 - 125 pg/mL  BASIC METABOLIC PANEL      Component Value Range   Sodium 135  135 - 145 mEq/L   Potassium 4.3  3.5 - 5.1 mEq/L   Chloride 96  96 - 112 mEq/L   CO2 25  19 - 32 mEq/L   Glucose, Bld 152 (*) 70 - 99 mg/dL   BUN 11  6 - 23 mg/dL   Creatinine, Ser 4.09  0.50 - 1.10 mg/dL   Calcium 81.1  8.4 - 91.4 mg/dL   GFR calc non Af Amer >90  >90 mL/min   GFR calc Af Amer >90  >90 mL/min  TROPONIN I      Component Value Range   Troponin I <0.30  <0.30 ng/mL  GLUCOSE, CAPILLARY      Component Value Range   Glucose-Capillary 146 (*) 70 - 99 mg/dL  CBC WITH DIFFERENTIAL      Component Value Range   WBC 11.5 (*) 4.0 - 10.5 K/uL   RBC 3.94  3.87 - 5.11 MIL/uL   Hemoglobin 12.3  12.0 - 15.0 g/dL   HCT 78.2 (*) 95.6 - 21.3 %   MCV 89.6  78.0 - 100.0 fL   MCH 31.2  26.0 - 34.0 pg   MCHC 34.8  30.0 - 36.0 g/dL   RDW 08.6  57.8 - 46.9 %   Platelets 445 (*) 150 - 400 K/uL   Neutrophils Relative 68  43 - 77 %   Neutro Abs 7.9 (*) 1.7 - 7.7 K/uL   Lymphocytes Relative 24  12 - 46 %   Lymphs Abs 2.7  0.7 - 4.0 K/uL   Monocytes Relative 7  3 - 12 %    Monocytes Absolute 0.8  0.1 - 1.0 K/uL   Eosinophils Relative 1  0 - 5 %   Eosinophils Absolute 0.1  0.0 - 0.7 K/uL   Basophils Relative 0  0 - 1 %   Basophils Absolute 0.0  0.0 - 0.1 K/uL  GLUCOSE, CAPILLARY      Component Value Range   Glucose-Capillary 158 (*) 70 - 99 mg/dL    Date: 62/95/2841  Rate: 74  Rhythm: normal sinus rhythm  QRS Axis: normal  Intervals: normal  ST/T Wave abnormalities: normal  Conduction Disutrbances:none  Narrative Interpretation:   Old EKG Reviewed: none available    1. COPD (chronic obstructive pulmonary disease)   2. Bronchitis       MDM  Patient with known COPD. Still currently a smoker. Followed by Dr. Juanetta Gosling. Her troponins recently started her on an antibiotic for bronchitis. Patient with worse coughing and chest discomfort the cough is at times productive. Going on for 2 weeks but worse today. Patient ran out of her inhalers 3 days ago. Patient's concerned about having pneumonia.  Workup in the emergency department chest x-ray negative for pneumonia. Mild leukocytosis. Patient with history of diabetes her blood sugars are well controlled at 150. No evidence of acidosis. Patient's room air saturations after 2 nebulizer treatments of  albuterol and Atrovent has remained above 90%. Patient feeling better at discharge. Suspect symptoms are COPD exacerbation with bronchitis may be a viral component as well.  Patient will be discharged home with albuterol inhaler and prednisone and continuing the antibiotics.  I personally performed the services described in this documentation, which was scribed in my presence. The recorded information has been reviewed and is accurate.        [name], MD 04/22/12 [phone]

## 2012-12-31 ENCOUNTER — Other Ambulatory Visit (HOSPITAL_COMMUNITY): Payer: Self-pay | Admitting: Pulmonary Disease

## 2012-12-31 DIAGNOSIS — R109 Unspecified abdominal pain: Secondary | ICD-10-CM

## 2013-01-04 ENCOUNTER — Encounter (HOSPITAL_COMMUNITY): Payer: Self-pay

## 2013-01-04 ENCOUNTER — Ambulatory Visit (HOSPITAL_COMMUNITY)
Admission: RE | Admit: 2013-01-04 | Discharge: 2013-01-04 | Disposition: A | Discharge status: Home / Self Care | Payer: Medicaid Other | Source: Ambulatory Visit | Attending: Pulmonary Disease | Admitting: Pulmonary Disease

## 2013-01-04 DIAGNOSIS — R109 Unspecified abdominal pain: Secondary | ICD-10-CM | POA: Diagnosis present

## 2013-01-04 DIAGNOSIS — R110 Nausea: Secondary | ICD-10-CM | POA: Diagnosis not present

## 2013-01-04 MED ORDER — IOHEXOL 300 MG/ML  SOLN
100.0000 mL | Freq: Once | INTRAMUSCULAR | Status: AC | PRN
  Administered 2013-01-04: 80 mL via INTRAVENOUS

## 2013-01-27 ENCOUNTER — Encounter (INDEPENDENT_AMBULATORY_CARE_PROVIDER_SITE_OTHER): Payer: Self-pay | Admitting: *Deleted

## 2013-02-08 ENCOUNTER — Ambulatory Visit (INDEPENDENT_AMBULATORY_CARE_PROVIDER_SITE_OTHER): Payer: Self-pay | Admitting: Internal Medicine

## 2013-02-08 ENCOUNTER — Encounter (INDEPENDENT_AMBULATORY_CARE_PROVIDER_SITE_OTHER): Payer: Self-pay | Admitting: Internal Medicine

## 2013-02-08 ENCOUNTER — Other Ambulatory Visit (INDEPENDENT_AMBULATORY_CARE_PROVIDER_SITE_OTHER): Payer: Self-pay | Admitting: *Deleted

## 2013-02-08 ENCOUNTER — Telehealth (INDEPENDENT_AMBULATORY_CARE_PROVIDER_SITE_OTHER): Payer: Self-pay | Admitting: *Deleted

## 2013-02-08 VITALS — BP 118/52 | HR 84 | Temp 98.5°F | Ht 61.0 in | Wt 103.2 lb

## 2013-02-08 DIAGNOSIS — K219 Gastro-esophageal reflux disease without esophagitis: Secondary | ICD-10-CM

## 2013-02-08 DIAGNOSIS — G8929 Other chronic pain: Secondary | ICD-10-CM | POA: Insufficient documentation

## 2013-02-08 DIAGNOSIS — R110 Nausea: Secondary | ICD-10-CM

## 2013-02-08 DIAGNOSIS — R1032 Left lower quadrant pain: Secondary | ICD-10-CM

## 2013-02-08 DIAGNOSIS — Z1211 Encounter for screening for malignant neoplasm of colon: Secondary | ICD-10-CM

## 2013-02-08 DIAGNOSIS — E119 Type 2 diabetes mellitus without complications: Secondary | ICD-10-CM | POA: Insufficient documentation

## 2013-02-08 DIAGNOSIS — R1013 Epigastric pain: Secondary | ICD-10-CM

## 2013-02-08 DIAGNOSIS — J449 Chronic obstructive pulmonary disease, unspecified: Secondary | ICD-10-CM | POA: Insufficient documentation

## 2013-02-08 DIAGNOSIS — R109 Unspecified abdominal pain: Secondary | ICD-10-CM

## 2013-02-08 DIAGNOSIS — J441 Chronic obstructive pulmonary disease with (acute) exacerbation: Secondary | ICD-10-CM | POA: Insufficient documentation

## 2013-02-08 MED ORDER — PEG 3350-KCL-NA BICARB-NACL 420 G PO SOLR: 4000.0000 mL | Freq: Once | ORAL | Status: DC

## 2013-02-08 MED ORDER — OMEPRAZOLE 40 MG PO CPDR: 40.0000 mg | DELAYED_RELEASE_CAPSULE | Freq: Every day | ORAL | Status: DC

## 2013-02-08 NOTE — Progress Notes (Signed)
Subjective:     Patient ID: Veronica Moses, female   DOB: 02-Jun-1955, 57 y.o.   MRN: [id number]  HPI Referred to our office by Dr. Juanetta Gosling for abdominal pain.  About 7 months ago she started having left lower abdominal pain radiating up into her left upper quadrant. with nausea. The pain occurs after she eats. She says she will have dry heaves. This pain comes and goes.  She says she is spitting up foamy white mucous. She says this bubbles up into her mouth . Occurs on a daily basis. It is a bad taste and smell. She presently is not taking a PPI.  She denies having any acid reflux.   Her appetite is not good. She is maintaining her weight. She does not feel like she is hungry. She usually has a BM about 4 days with a laxative. No rectal bleeding or melena. Hx of cholecystectomy greater than 10 yrs.  She takes about 4 Goody Powders a day for chronic pain (Neuropathy)  She has been a diabetic x 4 yrs. Blood sugars are not controlled at this time. She has never undergone a colonoscopy.  01/04/2013 NA 136, K 3.9, Glucose 117, BUN 22, Creatinine 1.21, total bili 0.4, ALP 37, AST 18, ALT 10, Albumin 4.7, Lipase 12/31/2012 CT abdomen/pelvis with CM; IMPRESSION:  No evidence of bowel obstruction.  Moderate colonic stool burden, raising the possibility of  constipation.  Status post cholecystectomy, left nephrectomy, and hysterectomy.   Review of Systems see hpi Current Outpatient Prescriptions  Medication Sig Dispense Refill  . ALPRAZolam (XANAX) 1 MG tablet Take 1 mg by mouth 3 (three) times daily as needed. For anxiety      . budesonide-formoterol (SYMBICORT) 160-4.5 MCG/ACT inhaler Inhale 2 puffs into the lungs 2 (two) times daily.      . insulin aspart (NOVOLOG) 100 UNIT/ML injection Inject 3-15 Units into the skin 3 (three) times daily before meals. As directed per sliding scale      . insulin glargine (LANTUS) 100 UNIT/ML injection Inject 14 Units into the skin at bedtime.      Marland Kitchen  oxyCODONE-acetaminophen (PERCOCET) 5-325 MG per tablet Take 1 tablet by mouth 4 (four) times daily.       Marland Kitchen omeprazole (PRILOSEC) 40 MG capsule Take 1 capsule (40 mg total) by mouth daily.  30 capsule  3   No current facility-administered medications for this visit.   Past Medical History  Diagnosis Date  . COPD (chronic obstructive pulmonary disease)   . Diabetes mellitus    Past Surgical History  Procedure Laterality Date  . Abdominal hysterectomy    . Nephrectomy    . Cholecystectomy    . Breast surgery     Allergies  Allergen Reactions  . Demerol Other (See Comments)    hallucinations       Objective:   Physical Exam  Filed Vitals:   02/08/13 1420  BP: 118/52  Pulse: 84  Temp: 98.5 F (36.9 C)  Height: 5\' 1"  (1.549 m)  Weight: 103 lb 3.2 oz (46.811 kg)  Alert and oriented. Skin warm and dry. Oral mucosa is moist.   . Sclera anicteric, conjunctivae is pink. Thyroid not enlarged. No cervical lymphadenopathy. Lungs clear. Heart regular rate and rhythm. Loud murmur heard.  Abdomen is soft. Bowel sounds are positive. No hepatomegaly. No abdominal masses felt. Umbilical and epigastric tenderness.  No edema to lower extremities.        Assessment:   nausea. GERD not controlled at  this time. Has been taking about 4 Goody Powders daily. PUD needs to be ruled out.  She could possible have an element of gastroparesis. She has been a diabetic x 4 yrs.    She has never undergone a colonoscopy in the past.     Plan:     Colonoscopy/EGD.The risks and benefits such as perforation, bleeding, and infection were reviewed with the patient and is agreeable. Rx for omeprazole 40 mg daily 30 minutes before breakfast.  Stop the Goody Powders.

## 2013-02-08 NOTE — Patient Instructions (Signed)
EGD/Colonoscopy. The risks and benefits such as perforation, bleeding, and infection were reviewed with the patient and is agreeable. 

## 2013-02-08 NOTE — Telephone Encounter (Signed)
Patient needs trilyte 

## 2013-02-23 ENCOUNTER — Encounter (HOSPITAL_COMMUNITY): Payer: Self-pay | Admitting: Pharmacy Technician

## 2013-03-04 ENCOUNTER — Encounter (HOSPITAL_COMMUNITY)
Admission: RE | Disposition: A | Discharge status: Home / Self Care | Payer: Self-pay | Source: Ambulatory Visit | Attending: Internal Medicine

## 2013-03-04 ENCOUNTER — Encounter (HOSPITAL_COMMUNITY): Payer: Self-pay | Admitting: *Deleted

## 2013-03-04 ENCOUNTER — Ambulatory Visit (HOSPITAL_COMMUNITY)
Admission: RE | Admit: 2013-03-04 | Discharge: 2013-03-04 | Disposition: A | Discharge status: Home / Self Care | Payer: Medicaid Other | Source: Ambulatory Visit | Attending: Internal Medicine | Admitting: Internal Medicine

## 2013-03-04 DIAGNOSIS — Z538 Procedure and treatment not carried out for other reasons: Secondary | ICD-10-CM

## 2013-03-04 DIAGNOSIS — K259 Gastric ulcer, unspecified as acute or chronic, without hemorrhage or perforation: Secondary | ICD-10-CM

## 2013-03-04 DIAGNOSIS — R1032 Left lower quadrant pain: Secondary | ICD-10-CM

## 2013-03-04 DIAGNOSIS — R110 Nausea: Secondary | ICD-10-CM

## 2013-03-04 DIAGNOSIS — Z794 Long term (current) use of insulin: Secondary | ICD-10-CM | POA: Insufficient documentation

## 2013-03-04 DIAGNOSIS — R109 Unspecified abdominal pain: Secondary | ICD-10-CM | POA: Insufficient documentation

## 2013-03-04 DIAGNOSIS — Z1211 Encounter for screening for malignant neoplasm of colon: Secondary | ICD-10-CM | POA: Insufficient documentation

## 2013-03-04 DIAGNOSIS — K2980 Duodenitis without bleeding: Secondary | ICD-10-CM | POA: Insufficient documentation

## 2013-03-04 DIAGNOSIS — K449 Diaphragmatic hernia without obstruction or gangrene: Secondary | ICD-10-CM | POA: Insufficient documentation

## 2013-03-04 DIAGNOSIS — J449 Chronic obstructive pulmonary disease, unspecified: Secondary | ICD-10-CM | POA: Insufficient documentation

## 2013-03-04 DIAGNOSIS — R634 Abnormal weight loss: Secondary | ICD-10-CM | POA: Insufficient documentation

## 2013-03-04 DIAGNOSIS — K219 Gastro-esophageal reflux disease without esophagitis: Secondary | ICD-10-CM

## 2013-03-04 DIAGNOSIS — E119 Type 2 diabetes mellitus without complications: Secondary | ICD-10-CM | POA: Insufficient documentation

## 2013-03-04 DIAGNOSIS — J4489 Other specified chronic obstructive pulmonary disease: Secondary | ICD-10-CM | POA: Insufficient documentation

## 2013-03-04 HISTORY — PX: COLONOSCOPY WITH ESOPHAGOGASTRODUODENOSCOPY (EGD): SHX5779

## 2013-03-04 LAB — HEPATIC FUNCTION PANEL
ALT: 13 U/L (ref 0–35)
Bilirubin, Direct: 0.1 mg/dL (ref 0.0–0.3)
Indirect Bilirubin: 0.2 mg/dL — ABNORMAL LOW (ref 0.3–0.9)
Total Bilirubin: 0.3 mg/dL (ref 0.3–1.2)

## 2013-03-04 SURGERY — COLONOSCOPY WITH ESOPHAGOGASTRODUODENOSCOPY (EGD)
Anesthesia: Moderate Sedation

## 2013-03-04 MED ORDER — STERILE WATER FOR IRRIGATION IR SOLN
Status: DC | PRN
  Administered 2013-03-04: 16:00:00

## 2013-03-04 MED ORDER — BUTAMBEN-TETRACAINE-BENZOCAINE 2-2-14 % EX AERO
INHALATION_SPRAY | CUTANEOUS | Status: DC | PRN
  Administered 2013-03-04: 2 via TOPICAL

## 2013-03-04 MED ORDER — PANTOPRAZOLE SODIUM 40 MG PO TBEC: 40.0000 mg | DELAYED_RELEASE_TABLET | Freq: Two times a day (BID) | ORAL | Status: DC

## 2013-03-04 MED ORDER — SODIUM CHLORIDE 0.9 % IV SOLN
INTRAVENOUS | Status: DC
  Administered 2013-03-04: 15:00:00 via INTRAVENOUS

## 2013-03-04 MED ORDER — MIDAZOLAM HCL 5 MG/5ML IJ SOLN
INTRAMUSCULAR | Status: AC
  Filled 2013-03-04: qty 5

## 2013-03-04 MED ORDER — MIDAZOLAM HCL 5 MG/5ML IJ SOLN
INTRAMUSCULAR | Status: DC | PRN
  Administered 2013-03-04 (×6): 2 mg via INTRAVENOUS

## 2013-03-04 MED ORDER — FENTANYL CITRATE 0.05 MG/ML IJ SOLN
INTRAMUSCULAR | Status: AC
  Filled 2013-03-04: qty 2

## 2013-03-04 MED ORDER — MIDAZOLAM HCL 5 MG/5ML IJ SOLN
INTRAMUSCULAR | Status: AC
  Filled 2013-03-04: qty 10

## 2013-03-04 MED ORDER — FENTANYL CITRATE 0.05 MG/ML IJ SOLN
INTRAMUSCULAR | Status: DC | PRN
  Administered 2013-03-04 (×2): 25 ug via INTRAVENOUS

## 2013-03-04 NOTE — H&P (Signed)
Veronica Moses is an 57 y.o. female.   Chief Complaint: Patient is here for EGD and colonoscopy. HPI: Patient is 57 year old Caucasian female who presents with 6 month history of nausea and frequent spitting up frothy liquid. She denies vomiting. She has frequent burping and abdominal pain which is more in the middle and the right side. She denies dysphagia. She reports an episode of tarry stool denies rectal bleeding. Her bowels move usually every other day. She says she has lost 47 pounds in the last 5 years time she was diagnosed with diabetes. She lost 10 pounds this year. About 2 months ago she abdominopelvic CT no abnormality found to account for symptoms. She has never been screened for CRC. Family history is negative for CRC.  Past Medical History  Diagnosis Date  . COPD (chronic obstructive pulmonary disease)   . Diabetes mellitus     Past Surgical History  Procedure Laterality Date  . Abdominal hysterectomy    . Nephrectomy    . Cholecystectomy    . Breast surgery      Family History  Problem Relation Age of Onset  . Colon cancer Neg Hx    Social History:  reports that she has quit smoking. Her smoking use included Cigarettes. She has a 26 pack-year smoking history. She quit smokeless tobacco use about 9 months ago. She reports that she does not drink alcohol or use illicit drugs.  Allergies:  Allergies  Allergen Reactions  . Demerol Other (See Comments)    hallucinations    Medications Prior to Admission  Medication Sig Dispense Refill  . ALPRAZolam (XANAX) 1 MG tablet Take 1 mg by mouth 3 (three) times daily as needed. For anxiety      . budesonide-formoterol (SYMBICORT) 160-4.5 MCG/ACT inhaler Inhale 2 puffs into the lungs 2 (two) times daily.      . insulin aspart (NOVOLOG) 100 UNIT/ML injection Inject 3-15 Units into the skin 3 (three) times daily before meals. As directed per sliding scale      . insulin glargine (LANTUS) 100 UNIT/ML injection Inject 14 Units  into the skin at bedtime.      Marland Kitchen omeprazole (PRILOSEC) 40 MG capsule Take 1 capsule (40 mg total) by mouth daily.  30 capsule  3  . polyethylene glycol-electrolytes (NULYTELY/GOLYTELY) 420 G solution Take 4,000 mLs by mouth once.  4000 mL  0  . oxyCODONE-acetaminophen (PERCOCET) 5-325 MG per tablet Take 1 tablet by mouth 4 (four) times daily as needed for moderate pain.         No results found for this or any previous visit (from the past 48 hour(s)). No results found.  ROS  Blood pressure 118/67, pulse 104, temperature 97.6 F (36.4 C), temperature source Oral, resp. rate 14, height 5\' 1"  (1.549 m), weight 98 lb (44.453 kg), SpO2 97.00%. Physical Exam  Constitutional:  Well-developed thin Caucasian female in NAD   HENT:  Mouth/Throat: Oropharynx is clear and moist.  Eyes: Conjunctivae are normal. No scleral icterus.  Neck: No thyromegaly present.  Cardiovascular: Normal rate and regular rhythm.   Murmur: faint SEM at LLSB. Respiratory: Effort normal and breath sounds normal.  GI: Soft. There is tenderness (mild generalized tenderness).  Musculoskeletal: She exhibits no edema.  Neurological: She is alert.  Skin: Skin is warm and dry.     Assessment/Plan Nausea anorexia abdominal pain and stenting of frothy liquid. Diagnostic EGD and average risk screening colonoscopy.  Kenesha Moshier U 03/04/2013, 4:16 PM

## 2013-03-04 NOTE — Op Note (Addendum)
EGD PROCEDURE REPORT  PATIENT:  Veronica Moses  MR#:  [id number] Birthdate:  1956-01-07, 57 y.o., female Endoscopist:  Dr. Malissa Hippo, MD Referred By:  Dr. Oneal Deputy. Juanetta Gosling, MD  Procedure Date: 03/04/2013  Procedure:   EGD & Colonoscopy(incomplete secondary to poor prep).  Indications:  Patient is 57 year old Caucasian female who presents with 6 month history of nausea decrease in appetite, spitting up frothy liquid as well as abdominal pain and 10 pound weight loss. Patient is on PPI chronically for GERD. She has been taking 3-4 hours daily until about 3 weeks ago when she was seen in the office advised to stop woody powder. However she is not feeling much better. Patient is also undergoing average risk screening colonoscopy. This is patient's first exam. Family history is negative for CRC.            Informed Consent:  The risks, benefits, alternatives & imponderables which include, but are not limited to, bleeding, infection, perforation, drug reaction and potential missed lesion have been reviewed.  The potential for biopsy, lesion removal, esophageal dilation, etc. have also been discussed.  Questions have been answered.  All parties agreeable.  Please see history & physical in medical record for more information.  Medications:  Demerol 50 mg IV Versed 12 mg IV Cetacaine spray topically for oropharyngeal anesthesia  EGD  Description of procedure:  The endoscope was introduced through the mouth and advanced to the second portion of the duodenum without difficulty or limitations. The mucosal surfaces were surveyed very carefully during advancement of the scope and upon withdrawal.  Findings:  Esophagus:  Mucosa of the esophagus was normal.  GE junction was unremarkable. GEJ:  37 cm Hiatus:  39 cm Stomach:  Small amount of bile noted in the stomach. It distended very well. Folds and mucosa and gastric body were normal. Specks of coffee-ground material noted coating antral mucosa.  Two small antral ulcers noted along with scarring edema and granularity. Pyloric channel was patent. Angularis fundus and cardia with examined by retroflex in the scope and were normal. Duodenum:  Patchy bulbar erythema and edema noted. Post bulbar mucosa was normal.  Therapeutic/Diagnostic Maneuvers Performed:  None  COLONOSCOPY Description of procedure:  After a digital rectal exam was performed, that colonoscope was advanced from the anus through the rectum into sigmoid colon and beyond. Scope was passed in the hepatic flexure. She had poor prep. Reaching the hepatic flexure required extensive washing and suctioning. Cecum not reached.  Findings:   Incomplete exam secondary to poor prep.    Therapeutic/Diagnostic Maneuvers Performed:  None  Complications:  None  Cecal Withdrawal Time:  N/A  Impression:  EGD findings; Small sliding hiatal hernia. 2 small ulcers at gastric antrum along with changes of gastritis and scarring. Bulbar duodenitis.  Colonoscopy findings; Incomplete exam secondary to poor prep.  Recommendations:  Discontinue omeprazole. Pantoprazole 40 mg by mouth twice a day. Once again patient advised not to take NSAIDs. H. pylori serology and LFTs will be checked today. Colonoscopy will be rescheduled after two day prep early next year.  Larell Baney U  03/04/2013 5:20 PM  CC: Dr. Fredirick Maudlin, MD & Dr. Bonnetta Barry ref. provider found

## 2013-03-07 LAB — GLUCOSE, CAPILLARY: Glucose-Capillary: 134 mg/dL — ABNORMAL HIGH (ref 70–99)

## 2013-03-08 ENCOUNTER — Encounter (HOSPITAL_COMMUNITY): Payer: Self-pay | Admitting: Internal Medicine

## 2013-04-20 ENCOUNTER — Encounter (INDEPENDENT_AMBULATORY_CARE_PROVIDER_SITE_OTHER): Payer: Self-pay | Admitting: *Deleted

## 2013-04-27 ENCOUNTER — Ambulatory Visit (INDEPENDENT_AMBULATORY_CARE_PROVIDER_SITE_OTHER): Payer: Self-pay | Admitting: Cardiology

## 2013-04-27 ENCOUNTER — Encounter: Payer: Self-pay | Admitting: Cardiology

## 2013-04-27 ENCOUNTER — Encounter: Payer: Self-pay | Admitting: *Deleted

## 2013-04-27 VITALS — BP 107/73 | HR 88 | Ht 61.0 in | Wt 101.0 lb

## 2013-04-27 DIAGNOSIS — I10 Essential (primary) hypertension: Secondary | ICD-10-CM

## 2013-04-27 DIAGNOSIS — R079 Chest pain, unspecified: Secondary | ICD-10-CM

## 2013-04-27 NOTE — Patient Instructions (Signed)
Your physician recommends that you schedule a follow-up appointment in: 3-4 WEEKS   Your physician has recommended you make the following change in your medication:   1) START TAKING ASPIRIN 81MG  ONCE DAILY  Your physician has requested that you have an echocardiogram. Echocardiography is a painless test that uses sound waves to create images of your heart. It provides your doctor with information about the size and shape of your heart and how well your heart's chambers and valves are working. This procedure takes approximately one hour. There are no restrictions for this procedure.  Your physician has requested that you have a stress echocardiogram. For further information please visit [email or web address]. Please follow instruction sheet as given.  WE WILL CALL YOU WITH YOUR TEST RESULTS/INSTRUCTIONS/NEXT STEPS ONCE RECEIVED BY THE PROVIDER

## 2013-04-27 NOTE — Progress Notes (Signed)
Clinical Summary Veronica Moses is a 58 y.o.female seen as a new patient today for chest pain. She is referred by Dr Luan Pulling.   1. Chest pain - tightness midchest, 7/10. + diaphoresis. Symptoms started approx a few years ago. Can occur at rest or with exertion. Nothing makes better or worst. Symptoms lasts up to 1 hour. Can feel fatigued after. Occurs 1-2 times per months. No change in frequency. Increase in severity.   CAD risk factors: DM, former tobaco, HL, father MI 15s, mother MI 77s.   2 . Hyperlipidemia - stopped statin a year ago because of cost per her report - no recent lipid panel in our system  Past Medical History  Diagnosis Date  . COPD (chronic obstructive pulmonary disease)   . Diabetes mellitus      Allergies  Allergen Reactions  . Demerol Other (See Comments)    hallucinations     Current Outpatient Prescriptions  Medication Sig Dispense Refill  . ALPRAZolam (XANAX) 1 MG tablet Take 1 mg by mouth 3 (three) times daily as needed. For anxiety      . budesonide-formoterol (SYMBICORT) 160-4.5 MCG/ACT inhaler Inhale 2 puffs into the lungs 2 (two) times daily.      . insulin aspart (NOVOLOG) 100 UNIT/ML injection Inject 3-15 Units into the skin 3 (three) times daily before meals. As directed per sliding scale      . insulin glargine (LANTUS) 100 UNIT/ML injection Inject 14 Units into the skin at bedtime.      Marland Kitchen oxyCODONE-acetaminophen (PERCOCET) 5-325 MG per tablet Take 1 tablet by mouth 4 (four) times daily as needed for moderate pain.       . pantoprazole (PROTONIX) 40 MG tablet Take 1 tablet (40 mg total) by mouth 2 (two) times daily before a meal.  60 tablet  3   No current facility-administered medications for this visit.     Past Surgical History  Procedure Laterality Date  . Abdominal hysterectomy    . Nephrectomy    . Cholecystectomy    . Breast surgery    . Colonoscopy with esophagogastroduodenoscopy (egd) N/A 03/04/2013    Procedure: COLONOSCOPY  WITH ESOPHAGOGASTRODUODENOSCOPY (EGD);  Surgeon: Rogene Houston, MD;  Location: [facility];  Service: Endoscopy;  Laterality: N/A;  1200-moved to [place] notified pt     Allergies  Allergen Reactions  . Demerol Other (See Comments)    hallucinations      Family History  Problem Relation Age of Onset  . Colon cancer Neg Hx      Social History Veronica Moses reports that she has quit smoking. Her smoking use included Cigarettes. She has a 26 pack-year smoking history. She quit smokeless tobacco use about a year ago. Veronica Moses reports that she does not drink alcohol.   Review of Systems CONSTITUTIONAL: No weight loss, fever, chills, weakness or fatigue.  HEENT: Eyes: No visual loss, blurred vision, double vision or yellow sclerae.No hearing loss, sneezing, congestion, runny nose or sore throat.  SKIN: No rash or itching.  CARDIOVASCULAR: per HPI RESPIRATORY: No shortness of breath, cough or sputum.  GASTROINTESTINAL: No anorexia, nausea, vomiting or diarrhea. No abdominal pain or blood.  GENITOURINARY: No burning on urination, no polyuria NEUROLOGICAL: No headache, dizziness, syncope, paralysis, ataxia, numbness or tingling in the extremities. No change in bowel or bladder control.  MUSCULOSKELETAL: No muscle, back pain, joint pain or stiffness.  LYMPHATICS: No enlarged nodes. No history of splenectomy.  PSYCHIATRIC: No history of  depression or anxiety.  ENDOCRINOLOGIC: No reports of sweating, cold or heat intolerance. No polyuria or polydipsia.  Marland Kitchen   Physical Examination p 88 bp 107/73 Wt 101 lbs BMI 19 Gen: resting comfortably, no acute distress HEENT: no scleral icterus, pupils equal round and reactive, no palptable cervical adenopathy,  CV: RRR, 2/6 systolic murmur RUSB, no JVD Resp: Clear to auscultation bilaterally GI: abdomen is soft, non-tender, non-distended, normal bowel sounds, no hepatosplenomegaly MSK: extremities are warm, no edema.  Skin: warm, no  rash Neuro:  no focal deficits Psych: appropriate affect   Diagnostic Studies 04/27/13 Clinic EKG Sinus rhythm   Assessment and Plan  1. Chest pain - unclear etiology, several CAD risk factors including DM and family history of early CAD - obtain stress echo - start ASA 81mg  daily for primary prevention in diabetic patient  2. Heart murmur - suggestive of aortic stenosis, will obtain echo  3. Hyperlipidemia - will request most recent panel from PCP  F/u 3-4 weeks   Arnoldo Lenis, M.D., F.A.C.C.

## 2013-04-28 ENCOUNTER — Encounter: Payer: Self-pay | Admitting: Cardiology

## 2013-05-05 ENCOUNTER — Ambulatory Visit (HOSPITAL_COMMUNITY)
Admission: RE | Admit: 2013-05-05 | Discharge: 2013-05-05 | Disposition: A | Discharge status: Home / Self Care | Payer: Medicaid Other | Source: Ambulatory Visit | Attending: Cardiology | Admitting: Cardiology

## 2013-05-05 DIAGNOSIS — I359 Nonrheumatic aortic valve disorder, unspecified: Secondary | ICD-10-CM

## 2013-05-05 DIAGNOSIS — I10 Essential (primary) hypertension: Secondary | ICD-10-CM | POA: Diagnosis not present

## 2013-05-05 DIAGNOSIS — E119 Type 2 diabetes mellitus without complications: Secondary | ICD-10-CM | POA: Insufficient documentation

## 2013-05-05 DIAGNOSIS — Z8249 Family history of ischemic heart disease and other diseases of the circulatory system: Secondary | ICD-10-CM | POA: Insufficient documentation

## 2013-05-05 DIAGNOSIS — R079 Chest pain, unspecified: Secondary | ICD-10-CM | POA: Diagnosis not present

## 2013-05-05 DIAGNOSIS — E785 Hyperlipidemia, unspecified: Secondary | ICD-10-CM | POA: Diagnosis not present

## 2013-05-05 DIAGNOSIS — Z87891 Personal history of nicotine dependence: Secondary | ICD-10-CM | POA: Diagnosis not present

## 2013-05-05 NOTE — Progress Notes (Signed)
*  PRELIMINARY RESULTS* Echocardiogram 2D Echocardiogram has been performed.  [place], [place] 05/05/2013, 11:09 AM

## 2013-05-11 ENCOUNTER — Inpatient Hospital Stay (HOSPITAL_COMMUNITY): Admission: RE | Admit: 2013-05-11 | Payer: Self-pay | Source: Ambulatory Visit

## 2013-05-17 NOTE — Progress Notes (Signed)
   HPI: [name] is a 58 year old patient of Dr. Harl Bowie following for ongoing assessment and management of hyper tension, hyperlipidemia, and recent complaints of chest pain. Other history includes COPD. The patient was scheduled for an echocardiogram which was completed prior to this office visit. Results  Study data: Technically adequate study. Technically adequate study. - Left ventricle: The cavity size was normal. Wall thickness was normal. Systolic function was normal. The estimated ejection fraction was in the range of 55% to 60%. Wall motion was normal; there were no regional wall motion abnormalities. The study is not technically sufficient to allow evaluation of LV diastolic function. LA pressure is normal (E/e' 6) - Aortic valve: Moderate regurgitation. - Aortic root: The aortic root was normal in size. - Mitral valve: Mildly calcified annulus. Mildly thickened leaflets .  No medication changes were made on last office visit. A stress echo was ordered, but not was not completed.. She started on aspirin 81 mg daily for primary prevention in a diabetic patient.  Allergies  Allergen Reactions  . Demerol Other (See Comments)    hallucinations    Current Outpatient Prescriptions  Medication Sig Dispense Refill  . ALPRAZolam (XANAX) 1 MG tablet Take 1 mg by mouth 3 (three) times daily as needed. For anxiety      . budesonide-formoterol (SYMBICORT) 160-4.5 MCG/ACT inhaler Inhale 2 puffs into the lungs 2 (two) times daily.      . insulin aspart (NOVOLOG) 100 UNIT/ML injection Inject 3-15 Units into the skin 3 (three) times daily before meals. As directed per sliding scale      . insulin glargine (LANTUS) 100 UNIT/ML injection Inject 14 Units into the skin at bedtime.      Marland Kitchen oxyCODONE-acetaminophen (PERCOCET) 5-325 MG per tablet Take 1 tablet by mouth 4 (four) times daily as needed for moderate pain.       . pantoprazole (PROTONIX) 40 MG tablet Take 1 tablet (40 mg total) by mouth  2 (two) times daily before a meal.  60 tablet  3  . sertraline (ZOLOFT) 50 MG tablet Take 50 mg by mouth daily.       No current facility-administered medications for this visit.    Past Medical History  Diagnosis Date  . COPD (chronic obstructive pulmonary disease)   . Diabetes mellitus     Past Surgical History  Procedure Laterality Date  . Abdominal hysterectomy    . Nephrectomy    . Cholecystectomy    . Breast surgery    . Colonoscopy with esophagogastroduodenoscopy (egd) N/A 03/04/2013    Procedure: COLONOSCOPY WITH ESOPHAGOGASTRODUODENOSCOPY (EGD);  Surgeon: Rogene Houston, MD;  Location: [facility];  Service: Endoscopy;  Laterality: N/A;  1200-moved to [place] notified pt    ROS: PHYSICAL EXAM There were no vitals taken for this visit.  EKG:  ASSESSMENT AND PLAN

## 2013-05-18 ENCOUNTER — Encounter: Payer: Medicaid Other | Admitting: Adult Health

## 2013-05-26 ENCOUNTER — Ambulatory Visit (HOSPITAL_COMMUNITY)
Admission: RE | Admit: 2013-05-26 | Discharge: 2013-05-26 | Disposition: A | Discharge status: Home / Self Care | Payer: Medicaid Other | Source: Ambulatory Visit | Attending: Cardiology | Admitting: Cardiology

## 2013-05-26 ENCOUNTER — Encounter (HOSPITAL_COMMUNITY): Payer: Self-pay

## 2013-05-26 DIAGNOSIS — R079 Chest pain, unspecified: Secondary | ICD-10-CM | POA: Diagnosis not present

## 2013-05-26 DIAGNOSIS — I10 Essential (primary) hypertension: Secondary | ICD-10-CM

## 2013-05-26 DIAGNOSIS — R072 Precordial pain: Secondary | ICD-10-CM

## 2013-05-26 NOTE — Progress Notes (Signed)
Stress Lab Nurses Notes - [place] [facility]  TAYONA SARNOWSKI 05/26/2013 Reason for doing test: Chest Pain and HTN Type of test: Stress Echo Nurse performing test: Gerrit Halls, RN Nuclear Medicine Tech: Not Applicable Echo Tech: Jamison Neighbor MD performing test: Branch/K.Purcell Nails NP Family MD: Luan Pulling Test explained and consent signed: yes IV started: No IV started Symptoms: SOB & Dizziness Treatment/Intervention: None Reason test stopped: dizziness and fatigue After recovery IV was: NA Patient to return to [facility]. Med at : NA Patient discharged: Home Patient's Condition upon discharge was: stable Comments: During test peak BP 161/92 & HR 148.  Recovery BP 130/71 & HR 78.  Symptoms resolved in recovery. Geanie Cooley T

## 2013-05-26 NOTE — Progress Notes (Signed)
*  PRELIMINARY RESULTS* Echocardiogram Echocardiogram Stress Test has been performed.  [place], [place] 05/26/2013, 9:55 AM

## 2013-05-30 ENCOUNTER — Encounter: Payer: Self-pay | Admitting: Adult Health

## 2013-05-30 ENCOUNTER — Ambulatory Visit (INDEPENDENT_AMBULATORY_CARE_PROVIDER_SITE_OTHER): Payer: Medicaid Other | Admitting: Adult Health

## 2013-05-30 VITALS — BP 118/59 | HR 82 | Ht 61.0 in | Wt 99.0 lb

## 2013-05-30 DIAGNOSIS — R079 Chest pain, unspecified: Secondary | ICD-10-CM

## 2013-05-30 DIAGNOSIS — J449 Chronic obstructive pulmonary disease, unspecified: Secondary | ICD-10-CM

## 2013-05-30 DIAGNOSIS — J4489 Other specified chronic obstructive pulmonary disease: Secondary | ICD-10-CM

## 2013-05-30 DIAGNOSIS — K219 Gastro-esophageal reflux disease without esophagitis: Secondary | ICD-10-CM

## 2013-05-30 NOTE — Assessment & Plan Note (Signed)
She is to continue current medication regimen per Dr. Luan Pulling.

## 2013-05-30 NOTE — Progress Notes (Deleted)
Name: Veronica Moses    DOB: 06/13/1955  Age: 58 y.o.  MR#: [id number]       PCP:  Alonza Bogus, MD      Insurance: Payor: MEDICAID PENDING / Plan: MEDICAID PENDING / Product Type: *No Product type* /   CC:    Chief Complaint  Patient presents with  . Hypertension    VS Filed Vitals:   05/30/13 1321  BP: 118/59  Pulse: 82  Height: 5' 1" (1.549 m)  Weight: 99 lb (44.906 kg)    Weights Current Weight  05/30/13 99 lb (44.906 kg)  04/27/13 101 lb (45.813 kg)  03/04/13 98 lb (44.453 kg)    Blood Pressure  BP Readings from Last 3 Encounters:  05/30/13 118/59  04/27/13 107/73  03/04/13 105/55     Admit date:  (Not on file) Last encounter with RMR:  Visit date not found   Allergy Demerol  Current Outpatient Prescriptions  Medication Sig Dispense Refill  . ALPRAZolam (XANAX) 1 MG tablet Take 1 mg by mouth 3 (three) times daily as needed. For anxiety      . budesonide-formoterol (SYMBICORT) 160-4.5 MCG/ACT inhaler Inhale 2 puffs into the lungs 2 (two) times daily.      . insulin aspart (NOVOLOG) 100 UNIT/ML injection Inject 3-15 Units into the skin 3 (three) times daily before meals. As directed per sliding scale      . insulin glargine (LANTUS) 100 UNIT/ML injection Inject 14 Units into the skin at bedtime.      Marland Kitchen oxyCODONE-acetaminophen (PERCOCET) 5-325 MG per tablet Take 1 tablet by mouth 4 (four) times daily as needed for moderate pain.       . pantoprazole (PROTONIX) 40 MG tablet Take 1 tablet (40 mg total) by mouth 2 (two) times daily before a meal.  60 tablet  3  . sertraline (ZOLOFT) 50 MG tablet Take 50 mg by mouth daily.       No current facility-administered medications for this visit.    Discontinued Meds:   There are no discontinued medications.  Patient Active Problem List   Diagnosis Date Noted  . Diabetes 02/08/2013  . COPD (chronic obstructive pulmonary disease) 02/08/2013  . GERD (gastroesophageal reflux disease) 02/08/2013  . Abdominal pain,  chronic, epigastric 02/08/2013    LABS    Component Value Date/Time   NA 135 04/22/2012 2007   NA 129* 05/27/2011 1702   NA 141 05/06/2010 1610   K 4.3 04/22/2012 2007   K 3.3* 05/27/2011 1702   K 4.2 05/06/2010 1610   CL 96 04/22/2012 2007   CL 97 05/27/2011 1702   CL 106 05/06/2010 1610   CO2 25 04/22/2012 2007   CO2 26 05/27/2011 1702   CO2 30 05/06/2010 1610   GLUCOSE 152* 04/22/2012 2007   GLUCOSE 48* 05/27/2011 1702   GLUCOSE 103* 05/06/2010 1610   BUN 11 04/22/2012 2007   BUN 15 05/27/2011 1702   BUN 12 05/06/2010 1610   CREATININE 0.77 04/22/2012 2007   CREATININE 0.95 05/27/2011 1702   CREATININE 1.03 05/06/2010 1610   CALCIUM 10.4 04/22/2012 2007   CALCIUM 8.8 05/27/2011 1702   CALCIUM 9.7 05/06/2010 1610   GFRNONAA >90 04/22/2012 2007   GFRNONAA 66* 05/27/2011 1702   GFRNONAA 56* 05/06/2010 1610   GFRAA >90 04/22/2012 2007   GFRAA 76* 05/27/2011 1702   GFRAA  Value: >60        The eGFR has been calculated using the MDRD equation. This calculation has not  been validated in all clinical situations. eGFR's persistently <60 mL/min signify possible Chronic Kidney Disease. 05/06/2010 1610   CMP     Component Value Date/Time   NA 135 04/22/2012 2007   K 4.3 04/22/2012 2007   CL 96 04/22/2012 2007   CO2 25 04/22/2012 2007   GLUCOSE 152* 04/22/2012 2007   BUN 11 04/22/2012 2007   CREATININE 0.77 04/22/2012 2007   CALCIUM 10.4 04/22/2012 2007   PROT 6.0 03/04/2013 1730   ALBUMIN 3.6 03/04/2013 1730   AST 21 03/04/2013 1730   ALT 13 03/04/2013 1730   ALKPHOS 25* 03/04/2013 1730   BILITOT 0.3 03/04/2013 1730   GFRNONAA >90 04/22/2012 2007   GFRAA >90 04/22/2012 2007       Component Value Date/Time   WBC 11.5* 04/22/2012 2210   WBC 5.1 05/27/2011 1702   WBC 5.8 05/06/2010 1610   HGB 12.3 04/22/2012 2210   HGB 11.6* 05/27/2011 1702   HGB 13.7 05/06/2010 1610   HCT 35.3* 04/22/2012 2210   HCT 32.7* 05/27/2011 1702   HCT 40.1 05/06/2010 1610   MCV 89.6 04/22/2012 2210   MCV 87.9 05/27/2011 1702   MCV 88.3 05/06/2010 1610     Lipid Panel  No results found for this basename: chol, trig, hdl, cholhdl, vldl, ldlcalc    ABG    Component Value Date/Time   PHART 7.393 06/29/2009 0530   PCO2ART 40.2 06/29/2009 0530   PO2ART 91.8 06/29/2009 0530   HCO3 24.0 06/29/2009 0530   TCO2 21.9 06/29/2009 0530   ACIDBASEDEF 0.3 06/29/2009 0530   O2SAT 96.9 06/29/2009 0530     No results found for this basename: TSH   BNP (last 3 results) No results found for this basename: PROBNP,  in the last 8760 hours Cardiac Panel (last 3 results) No results found for this basename: CKTOTAL, CKMB, TROPONINI, RELINDX,  in the last 72 hours  Iron/TIBC/Ferritin No results found for this basename: iron, tibc, ferritin     EKG Orders placed in visit on 04/27/13  . EKG 12-LEAD     Prior Assessment and Plan Problem List as of 05/30/2013     Respiratory   COPD (chronic obstructive pulmonary disease)     Digestive   GERD (gastroesophageal reflux disease)     Endocrine   Diabetes     Other   Abdominal pain, chronic, epigastric       Imaging: No results found.

## 2013-05-30 NOTE — Patient Instructions (Signed)
Your physician recommends that you schedule a follow-up appointment only if needed    Your physician recommends that you continue on your current medications as directed. Please refer to the Current Medication list given to you today.      Thank you for choosing [facility] !

## 2013-05-30 NOTE — Progress Notes (Signed)
HPI: Veronica Moses is a 58 year old female patient of Dr. Harl Bowie we are following for ongoing assessment and management of hypertension, cardiovascular risk factors to include hyperlipidemia, and diabetes, with occasional chest pains. She was last seen by Dr. Harl Bowie on 04/27/2013, a stress echocardiogram was ordered, and she was started on aspirin 81 mg daily. She was found to have an aortic murmur, with echo to assess this as well.    Stress echocardiogram was completed on 05/26/2013, results:Stress ECG conclusions: The stress ECG was normal.Staged echo: Normal echo stress. Evaluation of AoV in Feb of 2015: Aortic valve:   Trileaflet; mildly thickened leaflets. Doppler: There was no stenosis. Moderate regurgitation. VTI ratio of LVOT to aortic valve: 0.78. Valve area: 1.77cm^2(VTI). Indexed valve area: 1.26cm^2/m^2 (VTI). Peak velocity ratio of LVOT to aortic valve: 0.83. Valve area: 1.88cm^2 (Vmax). Indexed valve area: 1.33cm^2/m^2 (Vmax). Mean gradient: 20mm Hg (S). Peak gradient: 26mm Hg (S).   She comes today anxious, with ongoing discomfort in her chest. She is medically compliant, she is followed by Dr. Luan Pulling for diabetes control and COPD. She is also being treated for GERD by GI consultants.  Allergies  Allergen Reactions  . Demerol Other (See Comments)    hallucinations    Current Outpatient Prescriptions  Medication Sig Dispense Refill  . ALPRAZolam (XANAX) 1 MG tablet Take 1 mg by mouth 3 (three) times daily as needed. For anxiety      . budesonide-formoterol (SYMBICORT) 160-4.5 MCG/ACT inhaler Inhale 2 puffs into the lungs 2 (two) times daily.      . insulin aspart (NOVOLOG) 100 UNIT/ML injection Inject 3-15 Units into the skin 3 (three) times daily before meals. As directed per sliding scale      . insulin glargine (LANTUS) 100 UNIT/ML injection Inject 14 Units into the skin at bedtime.      Marland Kitchen oxyCODONE-acetaminophen (PERCOCET) 5-325 MG per tablet Take 1 tablet by mouth 4  (four) times daily as needed for moderate pain.       . pantoprazole (PROTONIX) 40 MG tablet Take 1 tablet (40 mg total) by mouth 2 (two) times daily before a meal.  60 tablet  3  . sertraline (ZOLOFT) 50 MG tablet Take 50 mg by mouth daily.       No current facility-administered medications for this visit.    Past Medical History  Diagnosis Date  . COPD (chronic obstructive pulmonary disease)   . Diabetes mellitus     Past Surgical History  Procedure Laterality Date  . Abdominal hysterectomy    . Nephrectomy    . Cholecystectomy    . Breast surgery    . Colonoscopy with esophagogastroduodenoscopy (egd) N/A 03/04/2013    Procedure: COLONOSCOPY WITH ESOPHAGOGASTRODUODENOSCOPY (EGD);  Surgeon: Rogene Houston, MD;  Location: [facility];  Service: Endoscopy;  Laterality: N/A;  1200-moved to [place] notified pt    DVV:OHYWVP of systems complete and found to be negative unless listed above  PHYSICAL EXAM BP 118/59  Pulse 82  Ht 5\' 1"  (1.549 m)  Wt 99 lb (44.906 kg)  BMI 18.72 kg/m2  General: Well developed, well nourished, in no acute distress Head: Eyes PERRLA, No xanthomas.   Normal cephalic and atramatic  Lungs: Clear bilaterally to auscultation and percussion. Heart: HRRR S1 S2, with 1/6 systolic murmur..  Pulses are 2+ & equal.            No carotid bruit. No JVD.  No abdominal bruits. No femoral bruits. Abdomen: Bowel sounds  are positive, abdomen soft and non-tender without masses or                  Hernia's noted. Msk:  Back normal, normal gait. Normal strength and tone for age. Extremities: No clubbing, cyanosis or edema.  DP +1 Neuro: Alert and oriented X 3. Psych:  Anxious affect, responds appropriately    ASSESSMENT AND PLAN

## 2013-05-30 NOTE — Assessment & Plan Note (Signed)
This appears to be noncardiac in etiology. I have given her reassurance concerning her stress echocardiogram. She is to continue current medication regimen and followup with Dr. Luan Pulling for ongoing assessment and management of medical issues. She will be seen on a when necessary basis only. We are happy to see her again at Dr. Luan Pulling discretion.

## 2013-05-30 NOTE — Assessment & Plan Note (Signed)
Continue PPI, and followup with GI consultants when necessary.

## 2013-10-24 ENCOUNTER — Observation Stay (HOSPITAL_COMMUNITY)
Admission: EM | Admit: 2013-10-24 | Discharge: 2013-10-25 | Disposition: A | Discharge status: Home / Self Care | Payer: Medicaid Other | Attending: Emergency Medicine | Admitting: Emergency Medicine

## 2013-10-24 ENCOUNTER — Emergency Department (HOSPITAL_COMMUNITY): Payer: Medicaid Other

## 2013-10-24 ENCOUNTER — Encounter (HOSPITAL_COMMUNITY): Payer: Self-pay | Admitting: Emergency Medicine

## 2013-10-24 DIAGNOSIS — E162 Hypoglycemia, unspecified: Secondary | ICD-10-CM

## 2013-10-24 DIAGNOSIS — J441 Chronic obstructive pulmonary disease with (acute) exacerbation: Secondary | ICD-10-CM | POA: Diagnosis present

## 2013-10-24 DIAGNOSIS — R0789 Other chest pain: Principal | ICD-10-CM | POA: Insufficient documentation

## 2013-10-24 DIAGNOSIS — E43 Unspecified severe protein-calorie malnutrition: Secondary | ICD-10-CM | POA: Insufficient documentation

## 2013-10-24 DIAGNOSIS — E119 Type 2 diabetes mellitus without complications: Secondary | ICD-10-CM | POA: Diagnosis present

## 2013-10-24 DIAGNOSIS — Z23 Encounter for immunization: Secondary | ICD-10-CM | POA: Insufficient documentation

## 2013-10-24 DIAGNOSIS — Z79899 Other long term (current) drug therapy: Secondary | ICD-10-CM | POA: Insufficient documentation

## 2013-10-24 DIAGNOSIS — J4489 Other specified chronic obstructive pulmonary disease: Secondary | ICD-10-CM | POA: Insufficient documentation

## 2013-10-24 DIAGNOSIS — R209 Unspecified disturbances of skin sensation: Secondary | ICD-10-CM | POA: Insufficient documentation

## 2013-10-24 DIAGNOSIS — E09649 Drug or chemical induced diabetes mellitus with hypoglycemia without coma: Secondary | ICD-10-CM

## 2013-10-24 DIAGNOSIS — E1369 Other specified diabetes mellitus with other specified complication: Secondary | ICD-10-CM

## 2013-10-24 DIAGNOSIS — Z794 Long term (current) use of insulin: Secondary | ICD-10-CM | POA: Insufficient documentation

## 2013-10-24 DIAGNOSIS — Z87891 Personal history of nicotine dependence: Secondary | ICD-10-CM | POA: Insufficient documentation

## 2013-10-24 DIAGNOSIS — E1169 Type 2 diabetes mellitus with other specified complication: Secondary | ICD-10-CM | POA: Insufficient documentation

## 2013-10-24 DIAGNOSIS — J449 Chronic obstructive pulmonary disease, unspecified: Secondary | ICD-10-CM | POA: Diagnosis present

## 2013-10-24 DIAGNOSIS — R079 Chest pain, unspecified: Secondary | ICD-10-CM | POA: Diagnosis present

## 2013-10-24 DIAGNOSIS — R200 Anesthesia of skin: Secondary | ICD-10-CM | POA: Diagnosis present

## 2013-10-24 LAB — CBC WITH DIFFERENTIAL/PLATELET
Basophils Absolute: 0 10*3/uL (ref 0.0–0.1)
Basophils Relative: 0 % (ref 0–1)
EOS PCT: 0 % (ref 0–5)
Eosinophils Absolute: 0 10*3/uL (ref 0.0–0.7)
HCT: 39.9 % (ref 36.0–46.0)
HEMOGLOBIN: 13.5 g/dL (ref 12.0–15.0)
LYMPHS ABS: 2.2 10*3/uL (ref 0.7–4.0)
LYMPHS PCT: 26 % (ref 12–46)
MCH: 29 pg (ref 26.0–34.0)
MCHC: 33.8 g/dL (ref 30.0–36.0)
MCV: 85.8 fL (ref 78.0–100.0)
Monocytes Absolute: 0.4 10*3/uL (ref 0.1–1.0)
Monocytes Relative: 4 % (ref 3–12)
NEUTROS PCT: 70 % (ref 43–77)
Neutro Abs: 5.7 10*3/uL (ref 1.7–7.7)
Platelets: 234 10*3/uL (ref 150–400)
RBC: 4.65 MIL/uL (ref 3.87–5.11)
RDW: 13.8 % (ref 11.5–15.5)
WBC: 8.2 10*3/uL (ref 4.0–10.5)

## 2013-10-24 LAB — GLUCOSE, CAPILLARY
Glucose-Capillary: 170 mg/dL — ABNORMAL HIGH (ref 70–99)
Glucose-Capillary: 189 mg/dL — ABNORMAL HIGH (ref 70–99)

## 2013-10-24 LAB — URINALYSIS, ROUTINE W REFLEX MICROSCOPIC
Bilirubin Urine: NEGATIVE
Glucose, UA: NEGATIVE mg/dL
Hgb urine dipstick: NEGATIVE
Ketones, ur: NEGATIVE mg/dL
Leukocytes, UA: NEGATIVE
Nitrite: NEGATIVE
Protein, ur: NEGATIVE mg/dL
Specific Gravity, Urine: <1.005 — ABNORMAL LOW (ref 1.005–1.030)
Urobilinogen, UA: 0.2 mg/dL (ref 0.0–1.0)
pH: 6.5 (ref 5.0–8.0)

## 2013-10-24 LAB — RAPID URINE DRUG SCREEN, HOSP PERFORMED
Amphetamines: NOT DETECTED
Barbiturates: NOT DETECTED
Benzodiazepines: POSITIVE — AB
Cocaine: NOT DETECTED
Opiates: NOT DETECTED
Tetrahydrocannabinol: NOT DETECTED

## 2013-10-24 LAB — PROTIME-INR
INR: 0.98 (ref 0.00–1.49)
Prothrombin Time: 13 seconds (ref 11.6–15.2)

## 2013-10-24 LAB — BASIC METABOLIC PANEL
Anion gap: 15 (ref 5–15)
BUN: 17 mg/dL (ref 6–23)
CHLORIDE: 98 meq/L (ref 96–112)
CO2: 28 meq/L (ref 19–32)
Calcium: 10.1 mg/dL (ref 8.4–10.5)
Creatinine, Ser: 0.97 mg/dL (ref 0.50–1.10)
GFR calc Af Amer: 73 mL/min — ABNORMAL LOW (ref >90)
GFR calc non Af Amer: 63 mL/min — ABNORMAL LOW (ref >90)
GLUCOSE: 102 mg/dL — AB (ref 70–99)
Potassium: 4 mEq/L (ref 3.7–5.3)
Sodium: 141 mEq/L (ref 137–147)

## 2013-10-24 LAB — I-STAT TROPONIN, ED: TROPONIN I, POC: 0 ng/mL (ref 0.00–0.08)

## 2013-10-24 LAB — TROPONIN I: Troponin I: <0.3 ng/mL (ref <0.30)

## 2013-10-24 LAB — CBG MONITORING, ED
Glucose-Capillary: 103 mg/dL — ABNORMAL HIGH (ref 70–99)
Glucose-Capillary: 49 mg/dL — ABNORMAL LOW (ref 70–99)
Glucose-Capillary: 162 mg/dL — ABNORMAL HIGH (ref 70–99)

## 2013-10-24 LAB — ETHANOL: Alcohol, Ethyl (B): <11 mg/dL (ref 0–11)

## 2013-10-24 LAB — APTT: aPTT: 28 seconds (ref 24–37)

## 2013-10-24 MED ORDER — MECLIZINE HCL 12.5 MG PO TABS
12.5000 mg | ORAL_TABLET | Freq: Three times a day (TID) | ORAL | Status: DC | PRN
  Administered 2013-10-24: 12.5 mg via ORAL
  Filled 2013-10-24: qty 1

## 2013-10-24 MED ORDER — HEPARIN SODIUM (PORCINE) 5000 UNIT/ML IJ SOLN
5000.0000 [IU] | Freq: Three times a day (TID) | INTRAMUSCULAR | Status: DC
  Administered 2013-10-24 – 2013-10-25 (×3): 5000 [IU] via SUBCUTANEOUS
  Filled 2013-10-24 (×2): qty 1

## 2013-10-24 MED ORDER — INSULIN GLARGINE 100 UNIT/ML ~~LOC~~ SOLN
14.0000 [IU] | Freq: Every day | SUBCUTANEOUS | Status: DC
  Administered 2013-10-24: 14 [IU] via SUBCUTANEOUS
  Filled 2013-10-24 (×2): qty 0.14

## 2013-10-24 MED ORDER — PNEUMOCOCCAL VAC POLYVALENT 25 MCG/0.5ML IJ INJ
0.5000 mL | INJECTION | INTRAMUSCULAR | Status: AC
  Administered 2013-10-25: 0.5 mL via INTRAMUSCULAR
  Filled 2013-10-24: qty 0.5

## 2013-10-24 MED ORDER — INSULIN ASPART 100 UNIT/ML ~~LOC~~ SOLN
0.0000 [IU] | Freq: Three times a day (TID) | SUBCUTANEOUS | Status: DC
  Administered 2013-10-25: 3 [IU] via SUBCUTANEOUS

## 2013-10-24 MED ORDER — INSULIN ASPART 100 UNIT/ML ~~LOC~~ SOLN: 0.0000 [IU] | Freq: Every day | SUBCUTANEOUS | Status: DC

## 2013-10-24 MED ORDER — ALPRAZOLAM 1 MG PO TABS
1.0000 mg | ORAL_TABLET | Freq: Three times a day (TID) | ORAL | Status: DC | PRN
Start: 2013-10-24 — End: 2013-10-25
  Administered 2013-10-25 (×2): 1 mg via ORAL
  Filled 2013-10-24 (×2): qty 1

## 2013-10-24 MED ORDER — ONDANSETRON HCL 4 MG PO TABS: 4.0000 mg | ORAL_TABLET | Freq: Four times a day (QID) | ORAL | Status: DC | PRN

## 2013-10-24 MED ORDER — SODIUM CHLORIDE 0.9 % IJ SOLN
3.0000 mL | Freq: Two times a day (BID) | INTRAMUSCULAR | Status: DC
  Administered 2013-10-24 – 2013-10-25 (×2): 3 mL via INTRAVENOUS

## 2013-10-24 MED ORDER — ONDANSETRON HCL 4 MG/2ML IJ SOLN
4.0000 mg | Freq: Four times a day (QID) | INTRAMUSCULAR | Status: DC | PRN
Start: 2013-10-24 — End: 2013-10-25
  Administered 2013-10-24: 4 mg via INTRAVENOUS
  Filled 2013-10-24: qty 2

## 2013-10-24 MED ORDER — PANTOPRAZOLE SODIUM 40 MG PO TBEC: 40.0000 mg | DELAYED_RELEASE_TABLET | Freq: Two times a day (BID) | ORAL | Status: DC | PRN

## 2013-10-24 MED ORDER — MORPHINE SULFATE 2 MG/ML IJ SOLN: 2.0000 mg | INTRAMUSCULAR | Status: DC | PRN

## 2013-10-24 MED ORDER — ASPIRIN EC 81 MG PO TBEC
81.0000 mg | DELAYED_RELEASE_TABLET | Freq: Every day | ORAL | Status: DC
  Administered 2013-10-25: 81 mg via ORAL
  Filled 2013-10-24 (×2): qty 1

## 2013-10-24 MED ORDER — MORPHINE SULFATE 2 MG/ML IJ SOLN
2.0000 mg | Freq: Once | INTRAMUSCULAR | Status: AC
  Administered 2013-10-24: 2 mg via INTRAVENOUS
  Filled 2013-10-24: qty 1

## 2013-10-24 NOTE — ED Notes (Signed)
Ambulated pt to bathroom. Pt shuffled feet to bathroom.

## 2013-10-24 NOTE — Consult Note (Signed)
Ontario A. Merlene Laughter, MD     [email or web address]          Veronica Moses is an 58 y.o. female.   ASSESSMENT/PLAN: 1. Gait impairment with numbness of the feet and upper extremities. The physical examination does not support a physiological explanation for her symptoms At this time. Physical therapy was suggested.  2. Dizziness is nonspecific. Meclizine will be added.  3. Chest pain currently being ruled out.  4. Anxiety disorder.  The patient is a 58 year old white female who reports the acute onset of severe squeezing chest pain. She does have a history of chest pain in the past which has been evaluated and found not to be of chronic duration. She reports that the chest pain was associated afterwards with dizziness described as lightheadedness. She also has some dyspnea. She reports having some mild headaches although she does have a history of episodic mild headaches. Headaches were frontal. She reports having numbness and pulling like sensation of the upper extremities and legs. Symptoms have improved but she still has a lot of numbness of the feet. She still has some numbness of the hands. She reports having difficulties ambulating and some weakness of the extremities. There appears to be some fluctuating nature to her symptoms including the neurological symptoms. She does report having some increased psychosocial stresses at home. No GI GU symptoms are reported.The review of systems otherwise negative.     GENERAL: This is a thin female in no acute distress. Affect is restricted.  HEENT: Supple. Atraumatic normocephalic.   ABDOMEN: soft  EXTREMITIES: No edema   BACK: Normal.  SKIN: Normal by inspection.    MENTAL STATUS: Alert and oriented. Speech, language and cognition are generally intact. Judgment and insight normal.   CRANIAL NERVES: Pupils are equal, round and reactive to light and accommodation; extra ocular movements are full, there is no  significant nystagmus; visual fields are full; upper and lower facial muscles are normal in strength and symmetric, there is no flattening of the nasolabial folds; tongue is midline; uvula is midline; shoulder elevation is normal.  MOTOR: Normal tone, bulk and strength; no pronator drift.  COORDINATION: Left finger to nose is normal, right finger to nose is normal, No rest tremor; no intention tremor; no postural tremor; no bradykinesia.  REFLEXES: Deep tendon reflexes are symmetrical and normal to slightly brisk throughout. Babinski reflexes are flexor bilaterally.   SENSATION: Slightly reduced in the feet and distal legs to light touch and temperature.    Past Medical History  Diagnosis Date  . COPD (chronic obstructive pulmonary disease)   . Diabetes mellitus     Past Surgical History  Procedure Laterality Date  . Abdominal hysterectomy    . Nephrectomy    . Cholecystectomy    . Breast surgery    . Colonoscopy with esophagogastroduodenoscopy (egd) N/A 03/04/2013    Procedure: COLONOSCOPY WITH ESOPHAGOGASTRODUODENOSCOPY (EGD);  Surgeon: Rogene Houston, MD;  Location: [facility];  Service: Endoscopy;  Laterality: N/A;  1200-moved to [place] notified pt    Family History  Problem Relation Age of Onset  . Colon cancer Neg Hx   . CAD      family history  . Diabetes      Social History:  reports that she has quit smoking. Her smoking use included Cigarettes. She has a 26 pack-year smoking history. She quit smokeless tobacco use about 17 months ago. She reports that she does not drink alcohol or use illicit drugs.  Allergies:  Allergies  Allergen Reactions  . Demerol Other (See Comments)    hallucinations    Medications: Prior to Admission medications   Medication Sig Start Date End Date Taking? Authorizing Provider  ALPRAZolam Duanne Moron) 1 MG tablet Take 1 mg by mouth 3 (three) times daily as needed. For anxiety   Yes Historical Provider, MD  aspirin EC 81 MG tablet  Take 81 mg by mouth daily.   Yes Historical Provider, MD  insulin aspart (NOVOLOG) 100 UNIT/ML injection Inject 3-15 Units into the skin 3 (three) times daily before meals. As directed per sliding scale   Yes Historical Provider, MD  insulin glargine (LANTUS) 100 UNIT/ML injection Inject 14 Units into the skin at bedtime.   Yes Historical Provider, MD  pantoprazole (PROTONIX) 40 MG tablet Take 40 mg by mouth 2 (two) times daily as needed (acid reflux). 03/04/13   Rogene Houston, MD    Scheduled Meds: . [START ON 10/25/2013] aspirin EC  81 mg Oral Daily  . heparin  5,000 Units Subcutaneous 3 times per day  . insulin aspart  0-5 Units Subcutaneous QHS  . [START ON 10/25/2013] insulin aspart  0-9 Units Subcutaneous TID WC  . insulin glargine  14 Units Subcutaneous QHS  . [START ON 10/25/2013] pneumococcal 23 valent vaccine  0.5 mL Intramuscular Tomorrow-1000  . sodium chloride  3 mL Intravenous Q12H   Continuous Infusions:  PRN Meds:.ALPRAZolam, morphine injection, ondansetron (ZOFRAN) IV, ondansetron, pantoprazole   Blood pressure 122/53, pulse 73, temperature 98.4 F (36.9 C), temperature source Oral, resp. rate 20, height _0  (1.549 m), weight 48.2 kg (106 lb 4.2 oz), SpO2 97.00%.   Results for orders placed during the hospital encounter of 10/24/13 (from the past 48 hour(s))  BASIC METABOLIC PANEL     Status: Abnormal   Collection Time    10/24/13 12:50 PM      Result Value Ref Range   Sodium 141  137 - 147 mEq/L   Potassium 4.0  3.7 - 5.3 mEq/L   Chloride 98  96 - 112 mEq/L   CO2 28  19 - 32 mEq/L   Glucose, Bld 102 (*) 70 - 99 mg/dL   BUN 17  6 - 23 mg/dL   Creatinine, Ser 0.97  0.50 - 1.10 mg/dL   Calcium 10.1  8.4 - 10.5 mg/dL   GFR calc non Af Amer 63 (*) >90 mL/min   GFR calc Af Amer 73 (*) >90 mL/min   Comment: (NOTE)     The eGFR has been calculated using the CKD EPI equation.     This calculation has not been validated in all clinical situations.     eGFR's  persistently <90 mL/min signify possible Chronic Kidney     Disease.   Anion gap 15  5 - 15  CBC WITH DIFFERENTIAL     Status: None   Collection Time    10/24/13 12:50 PM      Result Value Ref Range   WBC 8.2  4.0 - 10.5 K/uL   RBC 4.65  3.87 - 5.11 MIL/uL   Hemoglobin 13.5  12.0 - 15.0 g/dL   HCT 39.9  36.0 - 46.0 %   MCV 85.8  78.0 - 100.0 fL   MCH 29.0  26.0 - 34.0 pg   MCHC 33.8  30.0 - 36.0 g/dL   RDW 13.8  11.5 - 15.5 %   Platelets 234  150 - 400 K/uL   Neutrophils Relative % 70  43 - 77 %   Neutro Abs 5.7  1.7 - 7.7 K/uL   Lymphocytes Relative 26  12 - 46 %   Lymphs Abs 2.2  0.7 - 4.0 K/uL   Monocytes Relative 4  3 - 12 %   Monocytes Absolute 0.4  0.1 - 1.0 K/uL   Eosinophils Relative 0  0 - 5 %   Eosinophils Absolute 0.0  0.0 - 0.7 K/uL   Basophils Relative 0  0 - 1 %   Basophils Absolute 0.0  0.0 - 0.1 K/uL  ETHANOL     Status: None   Collection Time    10/24/13 12:50 PM      Result Value Ref Range   Alcohol, Ethyl (B) <11  0 - 11 mg/dL   Comment:            LOWEST DETECTABLE LIMIT FOR     SERUM ALCOHOL IS 11 mg/dL     FOR MEDICAL PURPOSES ONLY  PROTIME-INR     Status: None   Collection Time    10/24/13 12:50 PM      Result Value Ref Range   Prothrombin Time 13.0  11.6 - 15.2 seconds   INR 0.98  0.00 - 1.49  APTT     Status: None   Collection Time    10/24/13 12:50 PM      Result Value Ref Range   aPTT 28  24 - 37 seconds  CBG MONITORING, ED     Status: Abnormal   Collection Time    10/24/13 12:55 PM      Result Value Ref Range   Glucose-Capillary 103 (*) 70 - 99 mg/dL  I-STAT TROPOININ, ED     Status: None   Collection Time    10/24/13  1:03 PM      Result Value Ref Range   Troponin i, poc 0.00  0.00 - 0.08 ng/mL   Comment 3            Comment: Due to the release kinetics of cTnI,     a negative result within the first hours     of the onset of symptoms does not rule out     myocardial infarction with certainty.     If myocardial infarction is  still suspected,     repeat the test at appropriate intervals.  URINE RAPID DRUG SCREEN (HOSP PERFORMED)     Status: Abnormal   Collection Time    10/24/13  2:25 PM      Result Value Ref Range   Opiates NONE DETECTED  NONE DETECTED   Cocaine NONE DETECTED  NONE DETECTED   Benzodiazepines POSITIVE (*) NONE DETECTED   Amphetamines NONE DETECTED  NONE DETECTED   Tetrahydrocannabinol NONE DETECTED  NONE DETECTED   Barbiturates NONE DETECTED  NONE DETECTED   Comment:            DRUG SCREEN FOR MEDICAL PURPOSES     ONLY.  IF CONFIRMATION IS NEEDED     FOR ANY PURPOSE, NOTIFY LAB     WITHIN 5 DAYS.                LOWEST DETECTABLE LIMITS     FOR URINE DRUG SCREEN     Drug Class       Cutoff (ng/mL)     Amphetamine      1000     Barbiturate      200     Benzodiazepine   200  Tricyclics       751     Opiates          300     Cocaine          300     THC              50  URINALYSIS, ROUTINE W REFLEX MICROSCOPIC     Status: Abnormal   Collection Time    10/24/13  2:25 PM      Result Value Ref Range   Color, Urine YELLOW  YELLOW   APPearance CLEAR  CLEAR   Specific Gravity, Urine <1.005 (*) 1.005 - 1.030   pH 6.5  5.0 - 8.0   Glucose, UA NEGATIVE  NEGATIVE mg/dL   Hgb urine dipstick NEGATIVE  NEGATIVE   Bilirubin Urine NEGATIVE  NEGATIVE   Ketones, ur NEGATIVE  NEGATIVE mg/dL   Protein, ur NEGATIVE  NEGATIVE mg/dL   Urobilinogen, UA 0.2  0.0 - 1.0 mg/dL   Nitrite NEGATIVE  NEGATIVE   Leukocytes, UA NEGATIVE  NEGATIVE   Comment: MICROSCOPIC NOT DONE ON URINES WITH NEGATIVE PROTEIN, BLOOD, LEUKOCYTES, NITRITE, OR GLUCOSE <1000 mg/dL.  CBG MONITORING, ED     Status: Abnormal   Collection Time    10/24/13  3:45 PM      Result Value Ref Range   Glucose-Capillary 49 (*) 70 - 99 mg/dL  CBG MONITORING, ED     Status: Abnormal   Collection Time    10/24/13  4:53 PM      Result Value Ref Range   Glucose-Capillary 162 (*) 70 - 99 mg/dL  TROPONIN I     Status: None   Collection  Time    10/24/13  5:35 PM      Result Value Ref Range   Troponin I <0.30  <0.30 ng/mL   Comment:            Due to the release kinetics of cTnI,     a negative result within the first hours     of the onset of symptoms does not rule out     myocardial infarction with certainty.     If myocardial infarction is still suspected,     repeat the test at appropriate intervals.  GLUCOSE, CAPILLARY     Status: Abnormal   Collection Time    10/24/13  6:09 PM      Result Value Ref Range   Glucose-Capillary 170 (*) 70 - 99 mg/dL   Comment 1 Notify RN     Comment 2 Documented in Chart      Ct Head Wo Contrast  10/24/2013   CLINICAL DATA:  Altered mental status  EXAM: CT HEAD WITHOUT CONTRAST  TECHNIQUE: Contiguous axial images were obtained from the base of the skull through the vertex without intravenous contrast.  COMPARISON:  May 06, 2010  FINDINGS: The ventricles are normal in size and configuration. There is no appreciable mass, hemorrhage, extra-axial fluid collection, or midline shift. There is minimal small vessel disease in the centra semiovale bilaterally. Elsewhere gray-white compartments appear normal. No acute infarct apparent. The bony calvarium appears intact. The mastoid air cells are clear.  IMPRESSION: Minimal periventricular small vessel disease. No intracranial mass, hemorrhage, or acute appearing infarct.   Electronically Signed   By: Lowella Grip M.D.   On: 10/24/2013 13:53   Dg Chest Portable 1 View  10/24/2013   CLINICAL DATA:  Chest pain and shortness of breath.  EXAM: PORTABLE CHEST -  1 VIEW  COMPARISON:  04/22/2012  FINDINGS: The heart size and mediastinal contours are within normal limits. Both lungs are clear. The visualized skeletal structures are unremarkable.  IMPRESSION: Normal exam.   Electronically Signed   By: Rozetta Nunnery M.D.   On: 10/24/2013 12:59        Versie Soave A. Merlene Laughter, M.D.  Diplomate, [employer] adviser of Psychiatry and Neurology (  Neurology). 10/24/2013, 8:59 PM

## 2013-10-24 NOTE — ED Notes (Signed)
Pt c.o chest pain that is continuing along with dizziness. CBG obtained. 49. Crackers and drink given. Dr. Christy Gentles in to assess pt as well.

## 2013-10-24 NOTE — ED Notes (Signed)
Pt reports started having chest pain and numbness in hands and feet after walking to the mail box approx 1 hour ago.  Pt also c/o SOB.

## 2013-10-24 NOTE — H&P (Signed)
[facility] History and Physical  Veronica Moses NFA:[id number] DOB: 1956-01-12 DOA: 10/24/2013  Referring physician: ER. PCP: Alonza Bogus, MD   Chief Complaint: Chest pain. Bilateral limb numbness.  HPI: Veronica Moses is a 58 y.o. female  This is a 58 year old lady who gives a history of chest pain which has been present for the last 8 hours or so. It is aching/dull in nature and does not radiate. She is a diabetic. She also apparently had an episode in the emergency room of bilateral upper and lower limb weakness associated with numbness. This seems to have apparently approved improved after hypoglycemia was corrected. She is now being admitted for further investigation.   Review of Systems:  Constitutional:  No weight loss, night sweats, Fevers, chills, fatigue.  HEENT:  No headaches, Difficulty swallowing,Tooth/dental problems,Sore throat,  No sneezing, itching, ear ache, nasal congestion, post nasal drip,   GI:  No heartburn, indigestion, abdominal pain, nausea, vomiting, diarrhea, change in bowel habits, loss of appetite  Resp:  No shortness of breath with exertion or at rest. No excess mucus, no productive cough, No non-productive cough, No coughing up of blood.No change in color of mucus.No wheezing.No chest wall deformity  Skin:  no rash or lesions.  GU:  no dysuria, change in color of urine, no urgency or frequency. No flank pain.  Musculoskeletal:  No joint pain or swelling. No decreased range of motion. No back pain.  Psych:  No change in mood or affect. No depression or anxiety. No memory loss.   Past Medical History  Diagnosis Date  . COPD (chronic obstructive pulmonary disease)   . Diabetes mellitus    Past Surgical History  Procedure Laterality Date  . Abdominal hysterectomy    . Nephrectomy    . Cholecystectomy    . Breast surgery    . Colonoscopy with esophagogastroduodenoscopy (egd) N/A 03/04/2013    Procedure: COLONOSCOPY WITH  ESOPHAGOGASTRODUODENOSCOPY (EGD);  Surgeon: Rogene Houston, MD;  Location: [facility];  Service: Endoscopy;  Laterality: N/A;  1200-moved to [place] notified pt   Social History:  reports that she has quit smoking. Her smoking use included Cigarettes. She has a 26 pack-year smoking history. She quit smokeless tobacco use about 17 months ago. She reports that she does not drink alcohol or use illicit drugs.  Allergies  Allergen Reactions  . Demerol Other (See Comments)    hallucinations    Family History  Problem Relation Age of Onset  . Colon cancer Neg Hx   . CAD      family history  . Diabetes       Prior to Admission medications   Medication Sig Start Date End Date Taking? Authorizing Provider  ALPRAZolam Duanne Moron) 1 MG tablet Take 1 mg by mouth 3 (three) times daily as needed. For anxiety   Yes Historical Provider, MD  aspirin EC 81 MG tablet Take 81 mg by mouth daily.   Yes Historical Provider, MD  insulin aspart (NOVOLOG) 100 UNIT/ML injection Inject 3-15 Units into the skin 3 (three) times daily before meals. As directed per sliding scale   Yes Historical Provider, MD  insulin glargine (LANTUS) 100 UNIT/ML injection Inject 14 Units into the skin at bedtime.   Yes Historical Provider, MD  pantoprazole (PROTONIX) 40 MG tablet Take 40 mg by mouth 2 (two) times daily as needed (acid reflux). 03/04/13   Rogene Houston, MD   Physical Exam: Filed Vitals:   10/24/13 1352 10/24/13 1442 10/24/13  1500 10/24/13 1630  BP:  142/56 146/58 144/59  Pulse:  69 66 74  Temp: 97.9 F (36.6 C)     TempSrc:      Resp:  16    Height:      Weight:      SpO2:  99% 98% 94%    Wt Readings from Last 3 Encounters:  10/24/13 44.453 kg (98 lb)  05/30/13 44.906 kg (99 lb)  04/27/13 45.813 kg (101 lb)    General: Appears to be uncomfortable and anxious. Eyes: PERRL, normal lids, irises & conjunctiva ENT: grossly normal hearing, lips & tongue Neck: no LAD, masses or  thyromegaly Cardiovascular: RRR, no m/r/g. No LE edema. Telemetry: SR, no arrhythmias  Respiratory: CTA bilaterally, no w/r/r. Normal respiratory effort. Abdomen: soft, ntnd Skin: no rash or induration seen on limited exam Musculoskeletal: grossly normal tone BUE/BLE Psychiatric: grossly normal mood and affect, speech fluent and appropriate Neurologic: grossly non-focal.          Labs on Admission:  Basic Metabolic Panel:  Recent Labs Lab 10/24/13 1250  NA 141  K 4.0  CL 98  CO2 28  GLUCOSE 102*  BUN 17  CREATININE 0.97  CALCIUM 10.1       CBC:  Recent Labs Lab 10/24/13 1250  WBC 8.2  NEUTROABS 5.7  HGB 13.5  HCT 39.9  MCV 85.8  PLT 234     BNP (last 3 results) No results found for this basename: PROBNP,  in the last 8760 hours CBG:  Recent Labs Lab 10/24/13 1255 10/24/13 1545 10/24/13 1653  GLUCAP 103* 49* 162*    Radiological Exams on Admission: Ct Head Wo Contrast  10/24/2013   CLINICAL DATA:  Altered mental status  EXAM: CT HEAD WITHOUT CONTRAST  TECHNIQUE: Contiguous axial images were obtained from the base of the skull through the vertex without intravenous contrast.  COMPARISON:  May 06, 2010  FINDINGS: The ventricles are normal in size and configuration. There is no appreciable mass, hemorrhage, extra-axial fluid collection, or midline shift. There is minimal small vessel disease in the centra semiovale bilaterally. Elsewhere gray-white compartments appear normal. No acute infarct apparent. The bony calvarium appears intact. The mastoid air cells are clear.  IMPRESSION: Minimal periventricular small vessel disease. No intracranial mass, hemorrhage, or acute appearing infarct.   Electronically Signed   By: Lowella Grip M.D.   On: 10/24/2013 13:53   Dg Chest Portable 1 View  10/24/2013   CLINICAL DATA:  Chest pain and shortness of breath.  EXAM: PORTABLE CHEST - 1 VIEW  COMPARISON:  04/22/2012  FINDINGS: The heart size and mediastinal  contours are within normal limits. Both lungs are clear. The visualized skeletal structures are unremarkable.  IMPRESSION: Normal exam.   Electronically Signed   By: Rozetta Nunnery M.D.   On: 10/24/2013 12:59    EKG: Independently reviewed. Normal sinus rhythm without any acute ST-T wave changes.  Assessment/Plan   1. Chest pain, somewhat atypical and nonspecific. Electrocardiogram and initial cardiac enzyme not worrisome. 2. Limb numbness and weakness, seems to be improving. No obvious focal neurological signs. 3. Diabetes mellitus.  Plan: 1. Admit to medical floor. 2. Serial cardiac enzymes. 3. Neurology consultation. 4. Consider cardiology consultation.  Further recommendations will depend on patient's hospital progress.   Code Status: Full code.   DVT Prophylaxis: Heparin.  Family Communication: I discussed the plan with the patient at the bedside.   Disposition Plan: Home when medically stable.   Time spent: 74  minutes.  Doree Albee [facility] Pager [phone].  **Disclaimer: This note may have been dictated with voice recognition software. Similar sounding words can inadvertently be transcribed and this note may contain transcription errors which may not have been corrected upon publication of note.**

## 2013-10-24 NOTE — Plan of Care (Signed)
Problem: Consults Goal: Tobacco Cessation referral if indicated Outcome: Not Applicable Date Met:  75/30/05 Patient states she quit smoking 1 1/2 years ago.

## 2013-10-24 NOTE — ED Provider Notes (Signed)
CSN: [id number]     Arrival date & time 10/24/13  1224 History   First MD Initiated Contact with Patient 10/24/13 1259     Chief Complaint  Patient presents with  . Chest Pain      Patient is a 58 y.o. female presenting with chest pain. The history is provided by the patient and the spouse. The history is limited by the condition of the patient.  Chest Pain Pain quality: aching   Pain radiates to:  Does not radiate Pain severity:  Moderate Duration:  2 hours Timing:  Constant Progression:  Worsening Chronicity:  Recurrent Relieved by:  Nothing Worsened by:  Nothing tried pt initally presented for chest pain evaluation However during nurse evaluation pt started having stuttering speech and refusing to move her extremities  Pt has never had this type of episode previously   Past Medical History  Diagnosis Date  . COPD (chronic obstructive pulmonary disease)   . Diabetes mellitus    Past Surgical History  Procedure Laterality Date  . Abdominal hysterectomy    . Nephrectomy    . Cholecystectomy    . Breast surgery    . Colonoscopy with esophagogastroduodenoscopy (egd) N/A 03/04/2013    Procedure: COLONOSCOPY WITH ESOPHAGOGASTRODUODENOSCOPY (EGD);  Surgeon: Rogene Houston, MD;  Location: [facility];  Service: Endoscopy;  Laterality: N/A;  1200-moved to [place] notified pt   Family History  Problem Relation Age of Onset  . Colon cancer Neg Hx   . CAD      family history  . Diabetes     History  Substance Use Topics  . Smoking status: Former Smoker -- 1.00 packs/day for 26 years    Types: Cigarettes  . Smokeless tobacco: Former Systems developer    Quit date: 05/15/2012  . Alcohol Use: No   OB History   Grav Para Term Preterm Abortions TAB SAB Ect Mult Living                 Review of Systems  Unable to perform ROS: Acuity of condition  Cardiovascular: Positive for chest pain.      Allergies  Demerol  Home Medications   Prior to Admission medications    Medication Sig Start Date End Date Taking? Authorizing Provider  ALPRAZolam Duanne Moron) 1 MG tablet Take 1 mg by mouth 3 (three) times daily as needed. For anxiety    Historical Provider, MD  budesonide-formoterol (SYMBICORT) 160-4.5 MCG/ACT inhaler Inhale 2 puffs into the lungs 2 (two) times daily.    Historical Provider, MD  insulin aspart (NOVOLOG) 100 UNIT/ML injection Inject 3-15 Units into the skin 3 (three) times daily before meals. As directed per sliding scale    Historical Provider, MD  insulin glargine (LANTUS) 100 UNIT/ML injection Inject 14 Units into the skin at bedtime.    Historical Provider, MD  oxyCODONE-acetaminophen (PERCOCET) 5-325 MG per tablet Take 1 tablet by mouth 4 (four) times daily as needed for moderate pain.     Historical Provider, MD  pantoprazole (PROTONIX) 40 MG tablet Take 1 tablet (40 mg total) by mouth 2 (two) times daily before a meal. 03/04/13   Rogene Houston, MD  sertraline (ZOLOFT) 50 MG tablet Take 50 mg by mouth daily.    Historical Provider, MD   BP 149/55  Pulse 79  Temp(Src) 97.9 F (36.6 C) (Oral)  Resp 32  Ht 5\' 1"  (1.549 m)  Wt 98 lb (44.453 kg)  BMI 18.53 kg/m2  SpO2 99% Physical Exam CONSTITUTIONAL:  Well developed/well nourished, anxious HEAD: Normocephalic/atraumatic EYES: EOMI/PERRL ENMT: Mucous membranes moist NECK: supple no meningeal signs SPINE:entire spine nontender CV: S1/S2 noted, no murmurs/rubs/gallops noted LUNGS: Lungs are clear to auscultation bilaterally, no apparent distress ABDOMEN: soft, nontender, no rebound or guarding GU:no cva tenderness NEURO: Pt is awake/alert and responsive to questions.  She has no facial droop.  She is stuttering when she attempts to speak.  She has drift both in bilateral UE and bilateral LE EXTREMITIES: pulses normal, full ROM SKIN: warm, color normal PSYCH: anxious  ED Course  Procedures   1:20 PM I was called to room as nurse concern for stroke Pt with bilateral UE and bilateral  LE weakness, however her exam is inconsistent and I don't fee this represents acute CVA 2:14 PM Pt still stuttering.  She will not move her upper extremities, however when her right arm is held above her head she has control and does not allow to hit her head 3:18 PM Pt speaking clearly No weakness noted in her upper extremities She is ambulatory She reports h/o CP previously with extensive workup previously (reviewed cardiology notes, had normal echo) HEART score less than 3 I spoke to Dr Luan Pulling who knows patient well She can f/u this week I doubt think her earlier episodes represents acute CVA/TIA 3:50 PM Pt feeling worse, reports return of CP, she feels numb in her hands and she is having hypoglycemia Will call for admission 4:13 PM D/w dr Otilio Saber, will admit for dr Luan Pulling  Pt has taken ASA today BP 146/58  Pulse 66  Temp(Src) 97.9 F (36.6 C) (Oral)  Resp 16  Ht 5\' 1"  (1.549 m)  Wt 98 lb (44.453 kg)  BMI 18.53 kg/m2  SpO2 98%  Labs Review Labs Reviewed  BASIC METABOLIC PANEL - Abnormal; Notable for the following:    Glucose, Bld 102 (*)    GFR calc non Af Amer 63 (*)    GFR calc Af Amer 73 (*)    All other components within normal limits  CBG MONITORING, ED - Abnormal; Notable for the following:    Glucose-Capillary 103 (*)    All other components within normal limits  CBC WITH DIFFERENTIAL  ETHANOL  PROTIME-INR  APTT  URINE RAPID DRUG SCREEN (HOSP PERFORMED)  URINALYSIS, ROUTINE W REFLEX MICROSCOPIC  I-STAT TROPOININ, ED    Imaging Review Dg Chest Portable 1 View  10/24/2013   CLINICAL DATA:  Chest pain and shortness of breath.  EXAM: PORTABLE CHEST - 1 VIEW  COMPARISON:  04/22/2012  FINDINGS: The heart size and mediastinal contours are within normal limits. Both lungs are clear. The visualized skeletal structures are unremarkable.  IMPRESSION: Normal exam.   Electronically Signed   By: Rozetta Nunnery M.D.   On: 10/24/2013 12:59     EKG  Interpretation   Date/Time:  Monday October 24 2013 12:37:56 EDT Ventricular Rate:  75 PR Interval:  117 QRS Duration: 84 QT Interval:  391 QTC Calculation: 437 R Axis:   64 Text Interpretation:  Sinus rhythm Borderline short PR interval Abnormal  R-wave progression, early transition Baseline wander in lead(s) V2  Confirmed by [place] ([id number]) on 10/24/2013 12:42:01 PM      MDM   Final diagnoses:  Hypoglycemia  Chest pain, rule out acute myocardial infarction  Numbness    Nursing notes including past medical history and social history reviewed and considered in documentation xrays reviewed and considered Labs/vital reviewed and considered     Elenore Rota  Earline Mayotte, MD 10/24/13 [phone]

## 2013-10-25 DIAGNOSIS — E43 Unspecified severe protein-calorie malnutrition: Secondary | ICD-10-CM | POA: Insufficient documentation

## 2013-10-25 LAB — CBC
HCT: 34.6 % — ABNORMAL LOW (ref 36.0–46.0)
Hemoglobin: 11.7 g/dL — ABNORMAL LOW (ref 12.0–15.0)
MCH: 28.9 pg (ref 26.0–34.0)
MCHC: 33.8 g/dL (ref 30.0–36.0)
MCV: 85.4 fL (ref 78.0–100.0)
PLATELETS: 209 10*3/uL (ref 150–400)
RBC: 4.05 MIL/uL (ref 3.87–5.11)
RDW: 13.8 % (ref 11.5–15.5)
WBC: 6.6 10*3/uL (ref 4.0–10.5)

## 2013-10-25 LAB — GLUCOSE, CAPILLARY
GLUCOSE-CAPILLARY: 109 mg/dL — AB (ref 70–99)
GLUCOSE-CAPILLARY: 217 mg/dL — AB (ref 70–99)
Glucose-Capillary: 217 mg/dL — ABNORMAL HIGH (ref 70–99)

## 2013-10-25 LAB — COMPREHENSIVE METABOLIC PANEL
ALK PHOS: 38 U/L — AB (ref 39–117)
ALT: 12 U/L (ref 0–35)
AST: 21 U/L (ref 0–37)
Albumin: 3.6 g/dL (ref 3.5–5.2)
Anion gap: 13 (ref 5–15)
BILIRUBIN TOTAL: 0.5 mg/dL (ref 0.3–1.2)
BUN: 17 mg/dL (ref 6–23)
CALCIUM: 9.3 mg/dL (ref 8.4–10.5)
CHLORIDE: 100 meq/L (ref 96–112)
CO2: 27 mEq/L (ref 19–32)
Creatinine, Ser: 0.95 mg/dL (ref 0.50–1.10)
GFR, EST AFRICAN AMERICAN: 75 mL/min — AB (ref >90)
GFR, EST NON AFRICAN AMERICAN: 64 mL/min — AB (ref >90)
GLUCOSE: 196 mg/dL — AB (ref 70–99)
POTASSIUM: 3.8 meq/L (ref 3.7–5.3)
SODIUM: 140 meq/L (ref 137–147)
Total Protein: 6.6 g/dL (ref 6.0–8.3)

## 2013-10-25 LAB — HEMOGLOBIN A1C
Hgb A1c MFr Bld: 6.5 % — ABNORMAL HIGH (ref <5.7)
MEAN PLASMA GLUCOSE: 140 mg/dL — AB (ref <117)

## 2013-10-25 LAB — TROPONIN I
Troponin I: <0.3 ng/mL (ref <0.30)
Troponin I: <0.3 ng/mL (ref <0.30)

## 2013-10-25 LAB — TSH: TSH: 2.28 u[IU]/mL (ref 0.350–4.500)

## 2013-10-25 LAB — HOMOCYSTEINE: Homocysteine: 17.3 umol/L — ABNORMAL HIGH (ref 4.0–15.4)

## 2013-10-25 LAB — VITAMIN B12: Vitamin B-12: 426 pg/mL (ref 211–911)

## 2013-10-25 NOTE — Progress Notes (Signed)
Patient being d/c home with husband. Spoke with MD about patient getting a rolling walker prior to discharge. IV cath removed and intact. No pain/swelling at site. Verbalizes understanding of instructions. No c/o pain at this time.

## 2013-10-25 NOTE — Care Management Note (Signed)
    Page 1 of 1   10/25/2013     4:41:41 PM CARE MANAGEMENT NOTE 10/25/2013  Patient:  Veronica Moses, Veronica Moses   Account Number:  [id number]  Date Initiated:  10/25/2013  Documentation initiated by:  Vladimir Creeks  Subjective/Objective Assessment:   Admitted with chest pain, and bil limb numbness. pt is from home, lives with spouse and plans to return home. She was seen by PT and she recommended a walker and HH PT.     Action/Plan:   Pt refuses both walker and HH due to not able to pay for this, and does not want [facility] to do indigent assessment, because will still have to pay something, and  " does not need another bill!"   Anticipated DC Date:  10/25/2013   Anticipated DC Plan:  [place]  CM consult      Choice offered to / List presented to:     DME arranged  OTHER - SEE COMMENT        HH arranged  [place] - 11 Patient Refused      Status of service:  Completed, signed off Medicare Important Message given?   (If response is "NO", the following Medicare IM given date fields will be blank) Date Medicare IM given:   Medicare IM given by:   Date Additional Medicare IM given:   Additional Medicare IM given by:    Discharge Disposition:    Per UR Regulation:  Reviewed for med. necessity/level of care/duration of stay  If discussed at [place] of Stay Meetings, dates discussed:    Comments:  10/25/13 [place] RN/CM Refused walker-- Suggest they check with family for a walker, or at [place], etc. She states spouse is always with her and can help as needed, but she will consider checking these places for a free or less expensive walker

## 2013-10-25 NOTE — Progress Notes (Signed)
INITIAL NUTRITION ASSESSMENT  DOCUMENTATION CODES Per approved criteria  -Severe malnutrition in the context of acute illness or injury   Pt meets criteria for severe MALNUTRITION in the context of acute illness as evidenced by <50% energy intake x 5 days, moderate fat and muscle depletion.  INTERVENTION: Continue with current plan of care  NUTRITION DIAGNOSIS: Inadequate oral intake related to decreased appetite, nausea as evidenced by moderate fat and muscle depletion, nutrition hx.    Goal: Pt will meet >90% of estimated nutritional needs  Monitor:  PO intake, labs, weight changes, I/O's  Reason for Assessment: MST  58 y.o. female  Admitting Dx: <principal problem not specified>  ASSESSMENT: Pt reports weight loss and nutritional decline over the past 4 years. She reports she started losing weight due to poor appetite after being diagnosed with diabetes 4 years ago. Although most recent Hgb A1c demonstrates good control, pt reports struggling with diabetes management at home. She reports her blood sugars are "all over the place", reporting achieving readings in the 300-600's. She admits to a lot of stress at home. Reports UBW of 135-140# prior to diabetes diagnosis and UBW of 98# in the past year. Noted wt gain trend over the past 6 months.  She reports variable appetite PTA. At times, she will eat "everything" and other times it is a struggle for her to eat. Pt became very tearful during the interview and explained that she is afraid to eat most foods due to the potential effects that they will have on her blood sugar. She reports compliance with home monitoring, checking several times per day.   24 hour recall: Breakfast: oatmeal Lunch: skip Dinner: skip HS snack: oatmeal Snacks: [place] sausages Beverages: coffee, water, diet Mountain Dew, and Sprite Zero   Documented meal intake reveals 100%, however, noted pt only ate 25% of breakfast tray this AM. She declines any  nutritional supplements. She does not like the taste of Glucerna. Reviewed other supplements on the formulary and pt reports that none of these appeal to her.   Educated pt on importance of good intake to promote healing process and prevent hypoglycemic episodes. Reviewed how to manage hypoglycemic episodes at home. Briefly reviewed principles of diabetic diet. Educated pt on eating small, frequent meals to maximize PO intake. Also discussed ways pt could add protein sources in her diet to reduce excessive carb intake and prevent further loss of fat and muscle mass. Discussed specific examples of protein containing foods. Pt very grateful for education.  Labs reviewed. Glucose: 196. CBGS:109-217. Hgb A1c demonstrates good control.   Nutrition Focused Physical Exam:  Subcutaneous Fat:  Orbital Region: mild depletion Upper Arm Region: moderate depletion Thoracic and Lumbar Region: moderate depletion   Muscle:  Temple Region: mild depletion Clavicle Bone Region: moderate depletion Clavicle and Acromion Bone Region: moderate depletion Scapular Bone Region: moderate depletion Dorsal Hand: moderate depletion Patellar Region: moderate depletion Anterior Thigh Region: moderate depletion Posterior Calf Region: moderate depletion  Edema: none present  Height: Ht Readings from Last 1 Encounters:  10/24/13 5\' 1"  (1.549 m)    Weight: Wt Readings from Last 1 Encounters:  10/24/13 106 lb 4.2 oz (48.2 kg)    Ideal Body Weight: 105#  % Ideal Body Weight: 101%  Wt Readings from Last 10 Encounters:  10/24/13 106 lb 4.2 oz (48.2 kg)  05/30/13 99 lb (44.906 kg)  04/27/13 101 lb (45.813 kg)  03/04/13 98 lb (44.453 kg)  03/04/13 98 lb (44.453 kg)  02/08/13 103 lb  3.2 oz (46.811 kg)  04/22/12 92 lb (41.731 kg)  05/27/11 92 lb (41.731 kg)    Usual Body Weight: 140#  % Usual Body Weight: 75%  BMI:  Body mass index is 20.09 kg/(m^2). Normal weight range  Estimated Nutritional Needs: Kcal:  0981-1914 Protein: 63-73 grams Fluid: 1.1-1.7 L  Skin: WDL  Diet Order: Carb Control  EDUCATION NEEDS: -Education needs addressed   Intake/Output Summary (Last 24 hours) at 10/25/13 0831 Last data filed at 10/25/13 0810  Gross per 24 hour  Intake    240 ml  Output   2350 ml  Net  -2110 ml    Last BM: 10/23/13  Labs:   Recent Labs Lab 10/24/13 1250 10/25/13 0430  NA 141 140  K 4.0 3.8  CL 98 100  CO2 28 27  BUN 17 17  CREATININE 0.97 0.95  CALCIUM 10.1 9.3  GLUCOSE 102* 196*    CBG (last 3)   Recent Labs  10/24/13 2149 10/25/13 0153 10/25/13 0803  GLUCAP 189* 217* 109*   Lab Results  Component Value Date   HGBA1C 6.5* 10/24/2013   Scheduled Meds: . aspirin EC  81 mg Oral Daily  . heparin  5,000 Units Subcutaneous 3 times per day  . insulin aspart  0-5 Units Subcutaneous QHS  . insulin aspart  0-9 Units Subcutaneous TID WC  . insulin glargine  14 Units Subcutaneous QHS  . pneumococcal 23 valent vaccine  0.5 mL Intramuscular Tomorrow-1000  . sodium chloride  3 mL Intravenous Q12H    Continuous Infusions:   Past Medical History  Diagnosis Date  . COPD (chronic obstructive pulmonary disease)   . Diabetes mellitus     Past Surgical History  Procedure Laterality Date  . Abdominal hysterectomy    . Nephrectomy    . Cholecystectomy    . Breast surgery    . Colonoscopy with esophagogastroduodenoscopy (egd) N/A 03/04/2013    Procedure: COLONOSCOPY WITH ESOPHAGOGASTRODUODENOSCOPY (EGD);  Surgeon: Rogene Houston, MD;  Location: [facility];  Service: Endoscopy;  Laterality: N/A;  1200-moved to [place] notified pt    Keion Neels A. Jimmye Norman, RD, LDN Pager: [phone]

## 2013-10-25 NOTE — Progress Notes (Signed)
Subjective: She was admitted last night with chest pain. She has ruled out for myocardial infarction. She's had several cardiac evaluations and she continues to have trouble I will have her seen as an outpatient she also had numbness of her hands and legs which may have been related to hypoglycemia.  Objective: Vital signs in last 24 hours: Temp:  [97.7 F (36.5 C)-98.7 F (37.1 C)] 98.7 F (37.1 C) (08/11 0645) Pulse Rate:  [65-79] 69 (08/11 0645) Resp:  [16-32] 18 (08/11 0645) BP: (107-149)/(43-73) 107/43 mmHg (08/11 0645) SpO2:  [94 %-100 %] 99 % (08/11 0645) Weight:  [44.453 kg (98 lb)-48.2 kg (106 lb 4.2 oz)] 48.2 kg (106 lb 4.2 oz) (08/10 1757) Weight change:  Last BM Date: 10/23/13  Intake/Output from previous day: 08/10 0701 - 08/11 0700 In: 240 [P.O.:240] Out: 1650 [Urine:1650]  PHYSICAL EXAM General appearance: alert, cooperative and no distress Resp: clear to auscultation bilaterally Cardio: regular rate and rhythm, S1, S2 normal, no murmur, click, rub or gallop GI: soft, non-tender; bowel sounds normal; no masses,  no organomegaly Extremities: extremities normal, atraumatic, no cyanosis or edema  Lab Results:  Results for orders placed during the hospital encounter of 10/24/13 (from the past 48 hour(s))  BASIC METABOLIC PANEL     Status: Abnormal   Collection Time    10/24/13 12:50 PM      Result Value Ref Range   Sodium 141  137 - 147 mEq/L   Potassium 4.0  3.7 - 5.3 mEq/L   Chloride 98  96 - 112 mEq/L   CO2 28  19 - 32 mEq/L   Glucose, Bld 102 (*) 70 - 99 mg/dL   BUN 17  6 - 23 mg/dL   Creatinine, Ser 0.97  0.50 - 1.10 mg/dL   Calcium 10.1  8.4 - 10.5 mg/dL   GFR calc non Af Amer 63 (*) >90 mL/min   GFR calc Af Amer 73 (*) >90 mL/min   Comment: (NOTE)     The eGFR has been calculated using the CKD EPI equation.     This calculation has not been validated in all clinical situations.     eGFR's persistently <90 mL/min signify possible Chronic Kidney      Disease.   Anion gap 15  5 - 15  CBC WITH DIFFERENTIAL     Status: None   Collection Time    10/24/13 12:50 PM      Result Value Ref Range   WBC 8.2  4.0 - 10.5 K/uL   RBC 4.65  3.87 - 5.11 MIL/uL   Hemoglobin 13.5  12.0 - 15.0 g/dL   HCT 39.9  36.0 - 46.0 %   MCV 85.8  78.0 - 100.0 fL   MCH 29.0  26.0 - 34.0 pg   MCHC 33.8  30.0 - 36.0 g/dL   RDW 13.8  11.5 - 15.5 %   Platelets 234  150 - 400 K/uL   Neutrophils Relative % 70  43 - 77 %   Neutro Abs 5.7  1.7 - 7.7 K/uL   Lymphocytes Relative 26  12 - 46 %   Lymphs Abs 2.2  0.7 - 4.0 K/uL   Monocytes Relative 4  3 - 12 %   Monocytes Absolute 0.4  0.1 - 1.0 K/uL   Eosinophils Relative 0  0 - 5 %   Eosinophils Absolute 0.0  0.0 - 0.7 K/uL   Basophils Relative 0  0 - 1 %   Basophils Absolute 0.0  0.0 - 0.1 K/uL  ETHANOL     Status: None   Collection Time    10/24/13 12:50 PM      Result Value Ref Range   Alcohol, Ethyl (B) <11  0 - 11 mg/dL   Comment:            LOWEST DETECTABLE LIMIT FOR     SERUM ALCOHOL IS 11 mg/dL     FOR MEDICAL PURPOSES ONLY  PROTIME-INR     Status: None   Collection Time    10/24/13 12:50 PM      Result Value Ref Range   Prothrombin Time 13.0  11.6 - 15.2 seconds   INR 0.98  0.00 - 1.49  APTT     Status: None   Collection Time    10/24/13 12:50 PM      Result Value Ref Range   aPTT 28  24 - 37 seconds  TSH     Status: None   Collection Time    10/24/13 12:50 PM      Result Value Ref Range   TSH 2.280  0.350 - 4.500 uIU/mL   Comment: Performed at [facility], ED     Status: Abnormal   Collection Time    10/24/13 12:55 PM      Result Value Ref Range   Glucose-Capillary 103 (*) 70 - 99 mg/dL  [facility], ED     Status: None   Collection Time    10/24/13  1:03 PM      Result Value Ref Range   Troponin i, poc 0.00  0.00 - 0.08 ng/mL   Comment 3            Comment: Due to the release kinetics of cTnI,     a negative result within the first hours     of the  onset of symptoms does not rule out     myocardial infarction with certainty.     If myocardial infarction is still suspected,     repeat the test at appropriate intervals.  URINE RAPID DRUG SCREEN (HOSP PERFORMED)     Status: Abnormal   Collection Time    10/24/13  2:25 PM      Result Value Ref Range   Opiates NONE DETECTED  NONE DETECTED   Cocaine NONE DETECTED  NONE DETECTED   Benzodiazepines POSITIVE (*) NONE DETECTED   Amphetamines NONE DETECTED  NONE DETECTED   Tetrahydrocannabinol NONE DETECTED  NONE DETECTED   Barbiturates NONE DETECTED  NONE DETECTED   Comment:            DRUG SCREEN FOR MEDICAL PURPOSES     ONLY.  IF CONFIRMATION IS NEEDED     FOR ANY PURPOSE, NOTIFY LAB     WITHIN 5 DAYS.                LOWEST DETECTABLE LIMITS     FOR URINE DRUG SCREEN     Drug Class       Cutoff (ng/mL)     Amphetamine      1000     Barbiturate      200     Benzodiazepine   629     Tricyclics       476     Opiates          300     Cocaine          300     THC  50  URINALYSIS, ROUTINE W REFLEX MICROSCOPIC     Status: Abnormal   Collection Time    10/24/13  2:25 PM      Result Value Ref Range   Color, Urine YELLOW  YELLOW   APPearance CLEAR  CLEAR   Specific Gravity, Urine <1.005 (*) 1.005 - 1.030   pH 6.5  5.0 - 8.0   Glucose, UA NEGATIVE  NEGATIVE mg/dL   Hgb urine dipstick NEGATIVE  NEGATIVE   Bilirubin Urine NEGATIVE  NEGATIVE   Ketones, ur NEGATIVE  NEGATIVE mg/dL   Protein, ur NEGATIVE  NEGATIVE mg/dL   Urobilinogen, UA 0.2  0.0 - 1.0 mg/dL   Nitrite NEGATIVE  NEGATIVE   Leukocytes, UA NEGATIVE  NEGATIVE   Comment: MICROSCOPIC NOT DONE ON URINES WITH NEGATIVE PROTEIN, BLOOD, LEUKOCYTES, NITRITE, OR GLUCOSE <1000 mg/dL.  CBG MONITORING, ED     Status: Abnormal   Collection Time    10/24/13  3:45 PM      Result Value Ref Range   Glucose-Capillary 49 (*) 70 - 99 mg/dL  CBG MONITORING, ED     Status: Abnormal   Collection Time    10/24/13  4:53 PM       Result Value Ref Range   Glucose-Capillary 162 (*) 70 - 99 mg/dL  TROPONIN I     Status: None   Collection Time    10/24/13  5:35 PM      Result Value Ref Range   Troponin I <0.30  <0.30 ng/mL   Comment:            Due to the release kinetics of cTnI,     a negative result within the first hours     of the onset of symptoms does not rule out     myocardial infarction with certainty.     If myocardial infarction is still suspected,     repeat the test at appropriate intervals.  HEMOGLOBIN A1C     Status: Abnormal   Collection Time    10/24/13  5:35 PM      Result Value Ref Range   Hemoglobin A1C 6.5 (*) <5.7 %   Comment: (NOTE)                                                                               According to the ADA Clinical Practice Recommendations for 2011, when     HbA1c is used as a screening test:      >=6.5%   Diagnostic of Diabetes Mellitus               (if abnormal result is confirmed)     5.7-6.4%   Increased risk of developing Diabetes Mellitus     References:Diagnosis and Classification of Diabetes Mellitus,Diabetes     OEVO,3500,93(GHWEX 1):S62-S69 and Standards of Medical Care in             Diabetes - 2011,Diabetes Care,2011,34 (Suppl 1):S11-S61.   Mean Plasma Glucose 140 (*) <117 mg/dL   Comment: Performed at [place], [facility]     Status: Abnormal   Collection Time    10/24/13  6:09 PM      Result  Value Ref Range   Glucose-Capillary 170 (*) 70 - 99 mg/dL   Comment 1 Notify RN     Comment 2 Documented in Chart    GLUCOSE, [facility]     Status: Abnormal   Collection Time    10/24/13  9:49 PM      Result Value Ref Range   Glucose-Capillary 189 (*) 70 - 99 mg/dL   Comment 1 Notify RN     Comment 2 Documented in Chart    TROPONIN I     Status: None   Collection Time    10/24/13 11:10 PM      Result Value Ref Range   Troponin I <0.30  <0.30 ng/mL   Comment:            Due to the release kinetics of cTnI,     a negative  result within the first hours     of the onset of symptoms does not rule out     myocardial infarction with certainty.     If myocardial infarction is still suspected,     repeat the test at appropriate intervals.  GLUCOSE, [facility]     Status: Abnormal   Collection Time    10/25/13  1:53 AM      Result Value Ref Range   Glucose-Capillary 217 (*) 70 - 99 mg/dL   Comment 1 Notify RN     Comment 2 Documented in Chart    TROPONIN I     Status: None   Collection Time    10/25/13  4:30 AM      Result Value Ref Range   Troponin I <0.30  <0.30 ng/mL   Comment:            Due to the release kinetics of cTnI,     a negative result within the first hours     of the onset of symptoms does not rule out     myocardial infarction with certainty.     If myocardial infarction is still suspected,     repeat the test at appropriate intervals.  COMPREHENSIVE METABOLIC PANEL     Status: Abnormal   Collection Time    10/25/13  4:30 AM      Result Value Ref Range   Sodium 140  137 - 147 mEq/L   Potassium 3.8  3.7 - 5.3 mEq/L   Chloride 100  96 - 112 mEq/L   CO2 27  19 - 32 mEq/L   Glucose, Bld 196 (*) 70 - 99 mg/dL   BUN 17  6 - 23 mg/dL   Creatinine, Ser 0.95  0.50 - 1.10 mg/dL   Calcium 9.3  8.4 - 10.5 mg/dL   Total Protein 6.6  6.0 - 8.3 g/dL   Albumin 3.6  3.5 - 5.2 g/dL   AST 21  0 - 37 U/L   ALT 12  0 - 35 U/L   Alkaline Phosphatase 38 (*) 39 - 117 U/L   Total Bilirubin 0.5  0.3 - 1.2 mg/dL   GFR calc non Af Amer 64 (*) >90 mL/min   GFR calc Af Amer 75 (*) >90 mL/min   Comment: (NOTE)     The eGFR has been calculated using the CKD EPI equation.     This calculation has not been validated in all clinical situations.     eGFR's persistently <90 mL/min signify possible Chronic Kidney     Disease.   Anion gap 13  5 - 15  CBC     Status: Abnormal   Collection Time    10/25/13  4:30 AM      Result Value Ref Range   WBC 6.6  4.0 - 10.5 K/uL   RBC 4.05  3.87 - 5.11 MIL/uL    Hemoglobin 11.7 (*) 12.0 - 15.0 g/dL   HCT 34.6 (*) 36.0 - 46.0 %   MCV 85.4  78.0 - 100.0 fL   MCH 28.9  26.0 - 34.0 pg   MCHC 33.8  30.0 - 36.0 g/dL   RDW 13.8  11.5 - 15.5 %   Platelets 209  150 - 400 K/uL  GLUCOSE, [facility]     Status: Abnormal   Collection Time    10/25/13  8:03 AM      Result Value Ref Range   Glucose-Capillary 109 (*) 70 - 99 mg/dL   Comment 1 Notify RN      ABGS No results found for this basename: PHART, PCO2, PO2ART, TCO2, HCO3,  in the last 72 hours CULTURES No results found for this or any previous visit (from the past 240 hour(s)). Studies/Results: Ct Head Wo Contrast  10/24/2013   CLINICAL DATA:  Altered mental status  EXAM: CT HEAD WITHOUT CONTRAST  TECHNIQUE: Contiguous axial images were obtained from the base of the skull through the vertex without intravenous contrast.  COMPARISON:  May 06, 2010  FINDINGS: The ventricles are normal in size and configuration. There is no appreciable mass, hemorrhage, extra-axial fluid collection, or midline shift. There is minimal small vessel disease in the centra semiovale bilaterally. Elsewhere gray-white compartments appear normal. No acute infarct apparent. The bony calvarium appears intact. The mastoid air cells are clear.  IMPRESSION: Minimal periventricular small vessel disease. No intracranial mass, hemorrhage, or acute appearing infarct.   Electronically Signed   By: Lowella Grip M.D.   On: 10/24/2013 13:53   Dg Chest Portable 1 View  10/24/2013   CLINICAL DATA:  Chest pain and shortness of breath.  EXAM: PORTABLE CHEST - 1 VIEW  COMPARISON:  04/22/2012  FINDINGS: The heart size and mediastinal contours are within normal limits. Both lungs are clear. The visualized skeletal structures are unremarkable.  IMPRESSION: Normal exam.   Electronically Signed   By: Rozetta Nunnery M.D.   On: 10/24/2013 12:59    Medications:  Prior to Admission:  Prescriptions prior to admission  Medication Sig Dispense Refill   . ALPRAZolam (XANAX) 1 MG tablet Take 1 mg by mouth 3 (three) times daily as needed. For anxiety      . aspirin EC 81 MG tablet Take 81 mg by mouth daily.      . insulin aspart (NOVOLOG) 100 UNIT/ML injection Inject 3-15 Units into the skin 3 (three) times daily before meals. As directed per sliding scale      . insulin glargine (LANTUS) 100 UNIT/ML injection Inject 14 Units into the skin at bedtime.      . pantoprazole (PROTONIX) 40 MG tablet Take 40 mg by mouth 2 (two) times daily as needed (acid reflux).       Scheduled: . aspirin EC  81 mg Oral Daily  . heparin  5,000 Units Subcutaneous 3 times per day  . insulin aspart  0-5 Units Subcutaneous QHS  . insulin aspart  0-9 Units Subcutaneous TID WC  . insulin glargine  14 Units Subcutaneous QHS  . pneumococcal 23 valent vaccine  0.5 mL Intramuscular Tomorrow-1000  . sodium chloride  3 mL Intravenous Q12H   Continuous:  PQA:ESLPNPYYFR, meclizine, morphine injection, ondansetron (ZOFRAN) IV, ondansetron, pantoprazole  Assesment: She was admitted with chest pain and has ruled out for MI she has diabetes and was mildly hypoglycemic which may have produced some of her neurological symptoms. She's not having any chest pain now and she says that most of her numbness has resolved Active Problems:   Diabetes   COPD (chronic obstructive pulmonary disease)   Chest pain   Numbness, limb    Plan: I will see if we can get her up and moving around as PT to see her and if she does okay she can go home    LOS: 1 day   Cordon Gassett L 10/25/2013, 8:10 AM

## 2013-10-25 NOTE — Evaluation (Signed)
Physical Therapy Evaluation Patient Details Name: Veronica Moses MRN: [id number] DOB: 29-May-1955 Today's Date: 10/25/2013   History of Present Illness  This is a 58 year old lady who gives a history of chest pain which has been present for the last 8 hours or so. It is aching/dull in nature and does not radiate. She is a diabetic. She also apparently had an episode in the emergency room of bilateral upper and lower limb weakness associated with numbness. This seems to have apparently approved improved after hypoglycemia was corrected. She is now being admitted for further investigation.  Clinical Impression  Pt is a 58 year old female who presents to physical therapy with dx of chest pain.  Pt reports an episode of numbness to (B) LE, which is resolves however pt reports increased weakness into LE.  During evaluation, pt was mod (I) with bed mobility skills, min assist/min guard for transfer to stand, and min guard for gait with RW.  Pt had poor immediate standing balance; pt transitioned to use of RW for gait with resultant fair standing balance.  Pt was able to amb 20 feet with RW and min guard with noted varying step through/step to gait with decreased step length and foot clearance.  Recommend continued PT to address strengthening, balance, and activity tolerance for improvement of functional mobility skills with transition to HHPT.  Recommend use of RW for gait due to poor standing balance and hx of falls.      Follow Up Recommendations Home health PT    Equipment Recommendations  Rolling walker with 5" wheels       Precautions / Restrictions Precautions Precautions: Fall Restrictions Weight Bearing Restrictions: No      Mobility  Bed Mobility Overal bed mobility: Modified Independent             General bed mobility comments: Use of bedrail to complete task.   Transfers Overall transfer level: Needs assistance Equipment used: Rolling walker (2 wheeled) Transfers: Sit  to/from [facility] Sit to Stand: Min assist Stand pivot transfers: Min guard       General transfer comment: Poor immediate standing balance without AD, fair immediate standing balance with RW.   Ambulation/Gait Ambulation/Gait assistance: Min guard Ambulation Distance (Feet): 20 Feet   Gait Pattern/deviations: Step-to pattern;Step-through pattern;Decreased step length - right;Decreased step length - left;Decreased dorsiflexion - right;Decreased dorsiflexion - left;Ataxic;Shuffle   Gait velocity interpretation: Below normal speed for age/gender General Gait Details: Pt required use of RW for gait secondary to poor immediate standing balance without AD.  Noted inconsistent gait patterning, with alternating step though or step to gait with decreased foot clearance and step length (B).      Balance Overall balance assessment: History of Falls;Needs assistance (Pt reports hx of falls 1x/month requiring assist of husband to stand) Sitting-balance support: Feet supported;Single extremity supported Sitting balance-Leahy Scale: Good     Standing balance support: Bilateral upper extremity supported;During functional activity Standing balance-Leahy Scale: Fair Standing balance comment: Poor immediate standing balance,  fair balance with RW during gait                             Pertinent Vitals/Pain Pain Assessment: No/denies pain    Home Living Family/patient expects to be discharged to:: Private residence Living Arrangements: Spouse/significant other Available Help at Discharge: Family;Available 24 hours/day Type of Home: Mobile home Home Access: Stairs to enter Entrance Stairs-Rails: Can reach both Entrance Stairs-Number of Steps:  4-5 Home Layout: One level Home Equipment: None Additional Comments: Tub shower    Prior Function Level of Independence: Independent         Comments: 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       Hand Dominance   Dominant Hand: Right    Extremity/Trunk Assessment               Lower Extremity Assessment: Generalized weakness         Communication   Communication: No difficulties  Cognition Arousal/Alertness: Awake/alert Behavior During Therapy: WFL for tasks assessed/performed Overall Cognitive Status: Within Functional Limits for tasks assessed                               Assessment/Plan    PT Assessment Patient needs continued PT services  PT Diagnosis Abnormality of gait;Generalized weakness   PT Problem List Decreased strength;Decreased activity tolerance;Decreased knowledge of use of DME;Decreased balance;Decreased mobility  PT Treatment Interventions Balance training;Gait training;Neuromuscular re-education;Functional mobility training;Therapeutic activities;Therapeutic exercise;Patient/family education;Stair training   PT Goals (Current goals can be found in the Care Plan section) Acute Rehab PT Goals Patient Stated Goal: walk PT Goal Formulation: With patient Time For Goal Achievement: 11/08/13 Potential to Achieve Goals: Good    Frequency Min 3X/week    End of Session Equipment Utilized During Treatment: Gait belt Activity Tolerance: Patient limited by fatigue Patient left: in chair;with call bell/phone within reach;with chair alarm set      Functional Assessment Tool Used: Clinical Judgement Functional Limitation: Mobility: Walking and moving around Mobility: Walking and Moving Around Current Status [phone]): At least 1 percent but less than 20 percent impaired, limited or restricted Mobility: Walking and Moving Around Goal Status [phone]): 0 percent impaired, limited or restricted    Time: 4627-0350 PT Time Calculation (min): 23 min   Charges:   PT Evaluation $Initial PT Evaluation Tier I: 1 Procedure     PT G Codes:   Functional Assessment Tool Used: Clinical Judgement Functional Limitation: Mobility: Walking and  moving around    [facility] 10/25/2013, 11:52 AM

## 2013-10-31 NOTE — Discharge Summary (Signed)
Physician Discharge Summary  Patient ID: Veronica Moses MRN: [id number] DOB/AGE: 06-21-1955 58 y.o. Primary Care Physician:Veronica Rarick L, MD Admit date: 10/24/2013 Discharge date: 10/31/2013    Discharge Diagnoses:   Active Problems:   Diabetes   COPD (chronic obstructive pulmonary disease)   Chest pain   Numbness, limb   Protein-calorie malnutrition, severe     Medication List         ALPRAZolam 1 MG tablet  Commonly known as:  XANAX  Take 1 mg by mouth 3 (three) times daily as needed. For anxiety     aspirin EC 81 MG tablet  Take 81 mg by mouth daily.     insulin aspart 100 UNIT/ML injection  Commonly known as:  novoLOG  Inject 3-15 Units into the skin 3 (three) times daily before meals. As directed per sliding scale     insulin glargine 100 UNIT/ML injection  Commonly known as:  LANTUS  Inject 14 Units into the skin at bedtime.     pantoprazole 40 MG tablet  Commonly known as:  PROTONIX  Take 40 mg by mouth 2 (two) times daily as needed (acid reflux).        Discharged Condition: Improved    Consults: Neurology  Significant Diagnostic Studies: Ct Head Wo Contrast  10/24/2013   CLINICAL DATA:  Altered mental status  EXAM: CT HEAD WITHOUT CONTRAST  TECHNIQUE: Contiguous axial images were obtained from the base of the skull through the vertex without intravenous contrast.  COMPARISON:  May 06, 2010  FINDINGS: The ventricles are normal in size and configuration. There is no appreciable mass, hemorrhage, extra-axial fluid collection, or midline shift. There is minimal small vessel disease in the centra semiovale bilaterally. Elsewhere gray-white compartments appear normal. No acute infarct apparent. The bony calvarium appears intact. The mastoid air cells are clear.  IMPRESSION: Minimal periventricular small vessel disease. No intracranial mass, hemorrhage, or acute appearing infarct.   Electronically Signed   By: Veronica Moses M.D.   On: 10/24/2013 13:53    Dg Chest Portable 1 View  10/24/2013   CLINICAL DATA:  Chest pain and shortness of breath.  EXAM: PORTABLE CHEST - 1 VIEW  COMPARISON:  04/22/2012  FINDINGS: The heart size and mediastinal contours are within normal limits. Both lungs are clear. The visualized skeletal structures are unremarkable.  IMPRESSION: Normal exam.   Electronically Signed   By: Veronica Moses M.D.   On: 10/24/2013 12:59    Lab Results: Basic Metabolic Panel: No results found for this basename: NA, K, CL, CO2, GLUCOSE, BUN, CREATININE, CALCIUM, MG, PHOS,  in the last 72 hours Liver Function Tests: No results found for this basename: AST, ALT, ALKPHOS, BILITOT, PROT, ALBUMIN,  in the last 72 hours   CBC: No results found for this basename: WBC, NEUTROABS, HGB, HCT, MCV, PLT,  in the last 72 hours  No results found for this or any previous visit (from the past 240 hour(s)).   Hospital Course: This is a 58 year old who came to the emergency department because of chest discomfort and also because her arms and legs were numb. She has had multiple episodes of chest discomfort in the past. She says she's been under more "stress" recently. Her chest discomfort resolved almost immediately after she arrived at the emergency department but she continued to have difficulty with the abnormal sensations in her limbs. She was placed in observation. She ruled out for myocardial infarction. She had neurology consultation and it was felt that her symptoms  were not compatible with an organic problem. Her symptoms were much improved by the next morning and she had evaluation by physical therapy and it was felt that she needed further physical therapy and probably needed a walker. She was discharged home in improved condition.  Discharge Exam: Blood pressure 107/43, pulse 69, temperature 98.7 F (37.1 C), temperature source Oral, resp. rate 18, height 5\' 1"  (1.549 m), weight 48.2 kg (106 lb 4.2 oz), SpO2 99.00%. She is awake and alert. Her  chest is clear. Her heart is regular.  Disposition: Home with home health services      Discharge Instructions   Discharge patient    Complete by:  As directed      Face-to-face encounter (required for Medicare/Medicaid patients)    Complete by:  As directed   I Veronica Moses certify that this patient is under my care and that I, or a nurse practitioner or physician's assistant working with me, had a face-to-face encounter that meets the physician face-to-face encounter requirements with this patient on 10/25/2013. The encounter with the patient was in whole, or in part for the following medical condition(s) which is the primary reason for home health care (List medical condition): weakness  The encounter with the patient was in whole, or in part, for the following medical condition, which is the primary reason for home health care:  weakness  I certify that, based on my findings, the following services are medically necessary home health services:  Physical therapy  My clinical findings support the need for the above services:  Shortness of breath with activity  Further, I certify that my clinical findings support that this patient is homebound due to:  Shortness of Breath with activity  Reason for Medically Necessary Home Health Services:  Skilled Nursing- Change/Decline in Patient Status     For home use only DME Walker rolling    Complete by:  As directed      Home Health    Complete by:  As directed   To provide the following care/treatments:  PT             Signed: Omelia Marquart Moses   10/31/2013, 8:37 AM

## 2013-12-21 ENCOUNTER — Encounter: Payer: Self-pay | Admitting: Endocrinology

## 2013-12-21 ENCOUNTER — Ambulatory Visit (INDEPENDENT_AMBULATORY_CARE_PROVIDER_SITE_OTHER): Payer: Medicaid Other | Admitting: Endocrinology

## 2013-12-21 VITALS — BP 130/80 | HR 81 | Temp 98.0°F | Ht 61.0 in | Wt 105.0 lb

## 2013-12-21 DIAGNOSIS — E1042 Type 1 diabetes mellitus with diabetic polyneuropathy: Secondary | ICD-10-CM

## 2013-12-21 LAB — MICROALBUMIN / CREATININE URINE RATIO
CREATININE, U: 105.4 mg/dL
Microalb Creat Ratio: 0.1 mg/g (ref 0.0–30.0)
Microalb, Ur: 0.1 mg/dL (ref 0.0–1.9)

## 2013-12-21 LAB — LIPID PANEL
CHOL/HDL RATIO: 3
Cholesterol: 159 mg/dL (ref 0–200)
HDL: 57.3 mg/dL (ref >39.00)
LDL Cholesterol: 93 mg/dL (ref 0–99)
NonHDL: 101.7
Triglycerides: 44 mg/dL (ref 0.0–149.0)
VLDL: 8.8 mg/dL (ref 0.0–40.0)

## 2013-12-21 LAB — HEMOGLOBIN A1C: Hgb A1c MFr Bld: 6.7 % — ABNORMAL HIGH (ref 4.6–6.5)

## 2013-12-21 MED ORDER — GLUCAGON HCL (RDNA) 1 MG IJ SOLR: 1.0000 mg | Freq: Once | INTRAMUSCULAR | Status: DC | PRN

## 2013-12-21 NOTE — Patient Instructions (Addendum)
good diet and exercise habits significanly improve the control of your diabetes.  please let me know if you wish to be referred to a dietician.  high blood sugar is very risky to your health.  you should see an eye doctor and dentist every year.  It is very important to get all recommended vaccinations.  controlling your blood pressure and cholesterol drastically reduces the damage diabetes does to your body.  this also applies to quitting smoking.  please discuss these with your doctor.   check your blood sugar 4 times a day: before the 3 meals, and at bedtime.  also check if you have symptoms of your blood sugar being too high or too low.  please keep a record of the readings and bring it to your next appointment here.  You can write it on any piece of paper.  please call us sooner if your blood sugar goes below 70, or if you have a lot of readings over 200.   blood and urine tests are being requested for you today.  We'll contact you with results.  Please continue the same lantus, and:  Reduce the novolog to units 3 times a day (just before each meal):  Less than 200: none 200's: 2 units 300's: 4 units. Please come back for a follow-up appointment in 2 weeks.   i have sent a prescription to your pharmacy, for a medication called "glucagon."  This is a single-use emergency kit.  It will help you when your blood sugar is so low, you need someone else to help you.  The side-effect is nausea, so you should be turned on your side after getting this shot.

## 2013-12-21 NOTE — Progress Notes (Signed)
Subjective:    Patient ID: Veronica Moses, female    DOB: 16-Oct-1955, 58 y.o.   MRN: [id number]  HPI pt states DM was dx'ed in 2010; he has moderate neuropathy of all 4 extremities; she is unaware of any associated chronic complications; she has been on insulin since dx; pt says her diet and exercise are good; she has never had GDM, pancreatitis.  She has had multiple episodes of severe hypoglycemia, most recently 1 week ago.  She says this happens at any time of day.  She has not had DKA, except at dx of DM.   Past Medical History  Diagnosis Date  . COPD (chronic obstructive pulmonary disease)   . Diabetes mellitus     Past Surgical History  Procedure Laterality Date  . Abdominal hysterectomy    . Nephrectomy    . Cholecystectomy    . Breast surgery    . Colonoscopy with esophagogastroduodenoscopy (egd) N/A 03/04/2013    Procedure: COLONOSCOPY WITH ESOPHAGOGASTRODUODENOSCOPY (EGD);  Surgeon: Rogene Houston, MD;  Location: [facility];  Service: Endoscopy;  Laterality: N/A;  1200-moved to [place] notified pt    History   Social History  . Marital Status: Married    Spouse Name: N/A    Number of Children: N/A  . Years of Education: N/A   Occupational History  . Not on file.   Social History Main Topics  . Smoking status: Former Smoker -- 1.00 packs/day for 26 years    Types: Cigarettes  . Smokeless tobacco: Former Systems developer    Quit date: 05/15/2012  . Alcohol Use: No  . Drug Use: No  . Sexual Activity: Not on file   Other Topics Concern  . Not on file   Social History Narrative  . No narrative on file    Current Outpatient Prescriptions on File Prior to Visit  Medication Sig Dispense Refill  . ALPRAZolam (XANAX) 1 MG tablet Take 1 mg by mouth 3 (three) times daily as needed. For anxiety      . aspirin EC 81 MG tablet Take 81 mg by mouth daily.      . insulin aspart (NOVOLOG) 100 UNIT/ML injection Inject 3-15 Units into the skin 3 (three) times daily before meals.  As directed per sliding scale      . insulin glargine (LANTUS) 100 UNIT/ML injection Inject 14 Units into the skin at bedtime.      . pantoprazole (PROTONIX) 40 MG tablet Take 40 mg by mouth 2 (two) times daily as needed (acid reflux).       No current facility-administered medications on file prior to visit.    Allergies  Allergen Reactions  . Demerol Other (See Comments)    hallucinations    Family History  Problem Relation Age of Onset  . Colon cancer Neg Hx   . CAD      family history  . Diabetes Father   . Diabetes Brother    BP 130/80  Pulse 81  Temp(Src) 98 F (36.7 C) (Oral)  Ht $R'5\' 1"'Lu$  (1.549 m)  Wt 105 lb (47.628 kg)  BMI 19.85 kg/m2  SpO2 98%  Review of Systems denies blurry vision, headache, sob, n/v, urinary frequency, excessive diaphoresis, and rhinorrhea.  She has weight loss, difficulty with memory, depression, cold intolerance, easy bruising, muscle cramps, and blurry vision.  She has had cardiol eval of chest pain.      Objective:   Physical Exam VS: see vs page GEN: no distress  HEAD: head: no deformity eyes: no periorbital swelling, no proptosis external nose and ears are normal mouth: no lesion seen NECK: supple, thyroid is not enlarged CHEST WALL: no deformity LUNGS:  Clear to auscultation.   CV: reg rate and rhythm, no murmur ABD: abdomen is soft, nontender.  no hepatosplenomegaly.  not distended.  no hernia MUSCULOSKELETAL: muscle bulk and strength are grossly normal.  no obvious joint swelling.  gait is normal and steady EXTEMITIES: no deformity.  no ulcer on the feet.  feet are of normal color and temp.  no edema PULSES: dorsalis pedis intact bilat.  no carotid bruit NEURO:  cn 2-12 grossly intact.   readily moves all 4's.  sensation is intact to touch on the feet, but decreased from normal.   SKIN:  Normal texture and temperature.  No rash or suspicious lesion is visible.   NODES:  None palpable at the neck PSYCH: alert, well-oriented.  Does  not appear anxious nor depressed.  Lab Results  Component Value Date   HGBA1C 6.5* 10/24/2013   i reviewed electrocardiogram: 10/24/13    Assessment & Plan:  DM: new to me.  Overcontrolled.  Patient is advised the following: Patient Instructions  good diet and exercise habits significanly improve the control of your diabetes.  please let me know if you wish to be referred to a dietician.  high blood sugar is very risky to your health.  you should see an eye doctor and dentist every year.  It is very important to get all recommended vaccinations.  controlling your blood pressure and cholesterol drastically reduces the damage diabetes does to your body.  this also applies to quitting smoking.  please discuss these with your doctor.   check your blood sugar 4 times a day: before the 3 meals, and at bedtime.  also check if you have symptoms of your blood sugar being too high or too low.  please keep a record of the readings and bring it to your next appointment here.  You can write it on any piece of paper.  please call us sooner if your blood sugar goes below 70, or if you have a lot of readings over 200.   blood and urine tests are being requested for you today.  We'll contact you with results.  Please continue the same lantus, and:  Reduce the novolog to units 3 times a day (just before each meal):  Less than 200: none 200's: 2 units 300's: 4 units. Please come back for a follow-up appointment in 2 weeks.   i have sent a prescription to your pharmacy, for a medication called "glucagon."  This is a single-use emergency kit.  It will help you when your blood sugar is so low, you need someone else to help you.  The side-effect is nausea, so you should be turned on your side after getting this shot.

## 2014-01-09 ENCOUNTER — Encounter: Payer: Self-pay | Admitting: Endocrinology

## 2014-01-09 ENCOUNTER — Ambulatory Visit (INDEPENDENT_AMBULATORY_CARE_PROVIDER_SITE_OTHER): Payer: Medicaid Other | Admitting: Endocrinology

## 2014-01-09 VITALS — BP 112/56 | HR 75 | Temp 97.5°F | Ht 61.0 in | Wt 107.0 lb

## 2014-01-09 DIAGNOSIS — E1042 Type 1 diabetes mellitus with diabetic polyneuropathy: Secondary | ICD-10-CM

## 2014-01-09 NOTE — Progress Notes (Signed)
Subjective:    Patient ID: Veronica Moses, female    DOB: 09/29/1955, 58 y.o.   MRN: [id number]  HPI Pt returns for f/u of diabetes mellitus: DM type: 1 Dx'ed: 6759 Complications: polyneuropathy Therapy: insulin since dx DKA: only at dx Severe hypoglycemia: multiple episodes Pancreatitis: never Other: she takes multiple daily injections Interval history: she brings a record of her cbg's which i have reviewed today.  she has had just 1 episode of severe hypoglycemia.  This usually happens in the early hrs of the am.  cbg is sometimes as high as 589.  It is in general higher as the day goes on.    Past Medical History  Diagnosis Date  . COPD (chronic obstructive pulmonary disease)   . Diabetes mellitus     Past Surgical History  Procedure Laterality Date  . Abdominal hysterectomy    . Nephrectomy    . Cholecystectomy    . Breast surgery    . Colonoscopy with esophagogastroduodenoscopy (egd) N/A 03/04/2013    Procedure: COLONOSCOPY WITH ESOPHAGOGASTRODUODENOSCOPY (EGD);  Surgeon: Rogene Houston, MD;  Location: [facility];  Service: Endoscopy;  Laterality: N/A;  1200-moved to [place] notified pt    History   Social History  . Marital Status: Married    Spouse Name: N/A    Number of Children: N/A  . Years of Education: N/A   Occupational History  . Not on file.   Social History Main Topics  . Smoking status: Former Smoker -- 1.00 packs/day for 26 years    Types: Cigarettes  . Smokeless tobacco: Former Systems developer    Quit date: 05/15/2012  . Alcohol Use: No  . Drug Use: No  . Sexual Activity: Not on file   Other Topics Concern  . Not on file   Social History Narrative  . No narrative on file    Current Outpatient Prescriptions on File Prior to Visit  Medication Sig Dispense Refill  . ALPRAZolam (XANAX) 1 MG tablet Take 1 mg by mouth 3 (three) times daily as needed. For anxiety      . aspirin EC 81 MG tablet Take 81 mg by mouth daily.      . furosemide (LASIX)  40 MG tablet Take 40 mg by mouth.      Marland Kitchen glucagon (GLUCAGEN HYPOKIT) 1 MG SOLR injection Inject 1 mg into the skin once as needed for low blood sugar.  1 mg  10  . insulin aspart (NOVOLOG) 100 UNIT/ML injection Inject 2-4 Units into the skin 3 (three) times daily before meals. As directed per sliding scale      . insulin glargine (LANTUS) 100 UNIT/ML injection Inject 12 Units into the skin at bedtime.       . pantoprazole (PROTONIX) 40 MG tablet Take 40 mg by mouth 2 (two) times daily as needed (acid reflux).       No current facility-administered medications on file prior to visit.    Allergies  Allergen Reactions  . Demerol Other (See Comments)    hallucinations    Family History  Problem Relation Age of Onset  . Colon cancer Neg Hx   . CAD      family history  . Diabetes Father   . Diabetes Brother     BP 112/56  Pulse 75  Temp(Src) 97.5 F (36.4 C) (Oral)  Ht 5\' 1"  (1.549 m)  Wt 107 lb (48.535 kg)  BMI 20.23 kg/m2  SpO2 92%   Review of Systems  She denies LOC and weight change.     Objective:   Physical Exam VITAL SIGNS:  See vs page GENERAL: no distress SKIN:  Insulin injection sites at the anterior abdomen are normal.    Lab Results  Component Value Date   HGBA1C 6.7* 12/21/2013       Assessment & Plan:  DM: less overcontrolled now.    Patient is advised the following: Patient Instructions  check your blood sugar 4 times a day: before the 3 meals, and at bedtime.  also check if you have symptoms of your blood sugar being too high or too low.  please keep a record of the readings and bring it to your next appointment here.  You can write it on any piece of paper.  please call us sooner if your blood sugar goes below 70, or if you have a lot of readings over 200.   Please reduce the lantus to 12 units at bedtime, and: continue the novolog, 3 times a day (just before each meal):  Less than 200: none.   200's: 2 units.  300's: 4 units.  Please come back for  a follow-up appointment in 2 weeks.

## 2014-01-09 NOTE — Patient Instructions (Addendum)
check your blood sugar 4 times a day: before the 3 meals, and at bedtime.  also check if you have symptoms of your blood sugar being too high or too low.  please keep a record of the readings and bring it to your next appointment here.  You can write it on any piece of paper.  please call us sooner if your blood sugar goes below 70, or if you have a lot of readings over 200.   Please reduce the lantus to 12 units at bedtime, and: continue the novolog, 3 times a day (just before each meal):  Less than 200: none.   200's: 2 units.  300's: 4 units.  Please come back for a follow-up appointment in 2 weeks.

## 2014-01-23 ENCOUNTER — Encounter: Payer: Self-pay | Admitting: Endocrinology

## 2014-01-23 ENCOUNTER — Ambulatory Visit (INDEPENDENT_AMBULATORY_CARE_PROVIDER_SITE_OTHER): Payer: Medicaid Other | Admitting: Endocrinology

## 2014-01-23 VITALS — BP 138/74 | HR 67 | Temp 97.5°F | Ht 61.0 in | Wt 106.0 lb

## 2014-01-23 DIAGNOSIS — E1042 Type 1 diabetes mellitus with diabetic polyneuropathy: Secondary | ICD-10-CM

## 2014-01-23 NOTE — Patient Instructions (Addendum)
check your blood sugar 4 times a day: before the 3 meals, and at bedtime.  also check if you have symptoms of your blood sugar being too high or too low.  please keep a record of the readings and bring it to your next appointment here.  You can write it on any piece of paper.  please call us sooner if your blood sugar goes below 70, or if you have a lot of readings over 200.   Please reduce the lantus to 10 units at bedtime, and: continue the novolog, 3 times a day (just before each meal; you take this insulin right before you eat, and only when you eat):  Less than 200: none.   200's: 2 units.  300's: 4 units.  Please come back for a follow-up appointment in 1 month.

## 2014-01-23 NOTE — Progress Notes (Signed)
Subjective:    Patient ID: Veronica Moses, female    DOB: 07/23/1955, 58 y.o.   MRN: [id number]  HPI Pt returns for f/u of diabetes mellitus:  DM type: 1 Dx'ed: 4008 Complications: polyneuropathy Therapy: insulin since dx DKA: only at dx Severe hypoglycemia: multiple episodes Pancreatitis: never Other: she takes multiple daily injections. Interval history: she brings a record of her cbg's which i have reviewed today.  It varies from 42-200, but most are in the 100's.  It is in general higher as the day goes on.  She has hypoglycemia when she takes novolog, but does not eat.  It is also sometimes low upon awakening in am.   Past Medical History  Diagnosis Date  . COPD (chronic obstructive pulmonary disease)   . Diabetes mellitus     Past Surgical History  Procedure Laterality Date  . Abdominal hysterectomy    . Nephrectomy    . Cholecystectomy    . Breast surgery    . Colonoscopy with esophagogastroduodenoscopy (egd) N/A 03/04/2013    Procedure: COLONOSCOPY WITH ESOPHAGOGASTRODUODENOSCOPY (EGD);  Surgeon: Rogene Houston, MD;  Location: [facility];  Service: Endoscopy;  Laterality: N/A;  1200-moved to [place] notified pt    History   Social History  . Marital Status: Married    Spouse Name: N/A    Number of Children: N/A  . Years of Education: N/A   Occupational History  . Not on file.   Social History Main Topics  . Smoking status: Former Smoker -- 1.00 packs/day for 26 years    Types: Cigarettes  . Smokeless tobacco: Former Systems developer    Quit date: 05/15/2012  . Alcohol Use: No  . Drug Use: No  . Sexual Activity: Not on file   Other Topics Concern  . Not on file   Social History Narrative    Current Outpatient Prescriptions on File Prior to Visit  Medication Sig Dispense Refill  . ALPRAZolam (XANAX) 1 MG tablet Take 1 mg by mouth 3 (three) times daily as needed. For anxiety    . aspirin EC 81 MG tablet Take 81 mg by mouth daily.    . furosemide (LASIX) 40  MG tablet Take 40 mg by mouth.    Marland Kitchen glucagon (GLUCAGEN HYPOKIT) 1 MG SOLR injection Inject 1 mg into the skin once as needed for low blood sugar. 1 mg 10  . insulin aspart (NOVOLOG) 100 UNIT/ML injection Inject 2-4 Units into the skin 3 (three) times daily before meals. As directed per sliding scale    . insulin glargine (LANTUS) 100 UNIT/ML injection Inject 10 Units into the skin at bedtime.     . pantoprazole (PROTONIX) 40 MG tablet Take 40 mg by mouth 2 (two) times daily as needed (acid reflux).     No current facility-administered medications on file prior to visit.    Allergies  Allergen Reactions  . Demerol Other (See Comments)    hallucinations    Family History  Problem Relation Age of Onset  . Colon cancer Neg Hx   . CAD      family history  . Diabetes Father   . Diabetes Brother     BP 138/74 mmHg  Pulse 67  Temp(Src) 97.5 F (36.4 C) (Oral)  Ht 5\' 1"  (1.549 m)  Wt 106 lb (48.081 kg)  BMI 20.04 kg/m2  SpO2 96%   Review of Systems She denies LOC and n/v    Objective:   Physical Exam VITAL SIGNS:  See vs page GENERAL: no distress Pulses: dorsalis pedis intact bilat.   Feet: no deformity. no edema  Skin: no ulcer on the feet. normal color and temp.  Neuro: sensation is intact to touch on the feet, but decreased from normal.      Assessment & Plan:  DM: glycemic control is improved. Hypoglycemia, due to insulin, improved, but not resolved.    Patient is advised the following: Patient Instructions  check your blood sugar 4 times a day: before the 3 meals, and at bedtime.  also check if you have symptoms of your blood sugar being too high or too low.  please keep a record of the readings and bring it to your next appointment here.  You can write it on any piece of paper.  please call us sooner if your blood sugar goes below 70, or if you have a lot of readings over 200.   Please reduce the lantus to 10 units at bedtime, and: continue the novolog, 3  times a day (just before each meal; you take this insulin right before you eat, and only when you eat):  Less than 200: none.   200's: 2 units.  300's: 4 units.  Please come back for a follow-up appointment in 1 month.

## 2014-02-22 ENCOUNTER — Encounter: Payer: Self-pay | Admitting: Endocrinology

## 2014-02-22 ENCOUNTER — Ambulatory Visit (INDEPENDENT_AMBULATORY_CARE_PROVIDER_SITE_OTHER): Payer: Medicaid Other | Admitting: Endocrinology

## 2014-02-22 VITALS — BP 132/70 | HR 88 | Temp 98.2°F | Ht 61.0 in | Wt 107.0 lb

## 2014-02-22 DIAGNOSIS — E1042 Type 1 diabetes mellitus with diabetic polyneuropathy: Secondary | ICD-10-CM

## 2014-02-22 NOTE — Patient Instructions (Addendum)
check your blood sugar 4 times a day: before the 3 meals, and at bedtime.  also check if you have symptoms of your blood sugar being too high or too low.  please keep a record of the readings and bring it to your next appointment here.  You can write it on any piece of paper.  please call us sooner if your blood sugar goes below 70, or if you have a lot of readings over 200.   Please reduce the lantus to 9 units daily, and take it in the morning, and: reduce the novolog, 3 times a day (just before each meal; you take this insulin right before you eat, and only when you eat):  Less than 200: none.   200's: 1 unit.  Over 300: 2 units.  Please come back for a follow-up appointment in 6 weeks.

## 2014-02-22 NOTE — Progress Notes (Signed)
Subjective:    Patient ID: Veronica Moses, female    DOB: 09-04-1955, 58 y.o.   MRN: [id number]  HPI Pt returns for f/u of diabetes mellitus:  DM type: 1 Dx'ed: 4235 Complications: polyneuropathy Therapy: insulin since dx DKA: only at dx Severe hypoglycemia: multiple episodes, most recently in Aug, 2015 Pancreatitis: never Other: she did poorly on multiple daily injections.  She has done better on a simple insulin regimen.   Interval history: she brings a record of her cbg's which i have reviewed today.  It varies from 40-300, but most are in the 100's.  It is in general higher as the day goes on, but not necessarily so.   Past Medical History  Diagnosis Date  . COPD (chronic obstructive pulmonary disease)   . Diabetes mellitus     Past Surgical History  Procedure Laterality Date  . Abdominal hysterectomy    . Nephrectomy    . Cholecystectomy    . Breast surgery    . Colonoscopy with esophagogastroduodenoscopy (egd) N/A 03/04/2013    Procedure: COLONOSCOPY WITH ESOPHAGOGASTRODUODENOSCOPY (EGD);  Surgeon: Rogene Houston, MD;  Location: [facility];  Service: Endoscopy;  Laterality: N/A;  1200-moved to [place] notified pt    History   Social History  . Marital Status: Married    Spouse Name: N/A    Number of Children: N/A  . Years of Education: N/A   Occupational History  . Not on file.   Social History Main Topics  . Smoking status: Former Smoker -- 1.00 packs/day for 26 years    Types: Cigarettes  . Smokeless tobacco: Former Systems developer    Quit date: 05/15/2012  . Alcohol Use: No  . Drug Use: No  . Sexual Activity: Not on file   Other Topics Concern  . Not on file   Social History Narrative    Current Outpatient Prescriptions on File Prior to Visit  Medication Sig Dispense Refill  . ALPRAZolam (XANAX) 1 MG tablet Take 1 mg by mouth 3 (three) times daily as needed. For anxiety    . aspirin EC 81 MG tablet Take 81 mg by mouth daily.    . furosemide (LASIX) 40  MG tablet Take 40 mg by mouth.    Marland Kitchen glucagon (GLUCAGEN HYPOKIT) 1 MG SOLR injection Inject 1 mg into the skin once as needed for low blood sugar. 1 mg 10  . insulin aspart (NOVOLOG) 100 UNIT/ML injection Inject 2-4 Units into the skin 3 (three) times daily before meals. As directed per sliding scale    . insulin glargine (LANTUS) 100 UNIT/ML injection Inject 9 Units into the skin every morning.     . pantoprazole (PROTONIX) 40 MG tablet Take 40 mg by mouth 2 (two) times daily as needed (acid reflux).     No current facility-administered medications on file prior to visit.    Allergies  Allergen Reactions  . Demerol Other (See Comments)    hallucinations    Family History  Problem Relation Age of Onset  . Colon cancer Neg Hx   . CAD      family history  . Diabetes Father   . Diabetes Brother     BP 132/70 mmHg  Pulse 88  Temp(Src) 98.2 F (36.8 C) (Oral)  Ht 5\' 1"  (1.549 m)  Wt 107 lb (48.535 kg)  BMI 20.23 kg/m2  SpO2 91%    Review of Systems Denies LOC and weight change    Objective:   Physical Exam  VITAL SIGNS:  See vs page GENERAL: no distress Pulses: dorsalis pedis intact bilat.   Feet: no deformity. no edema  Skin: no ulcer on the feet. normal color and temp.  Neuro: sensation is intact to touch on the feet, but decreased from normal.     Lab Results  Component Value Date   HGBA1C 6.7* 12/21/2013       Assessment & Plan:  DM: overcontrolled Side-effect of rx: hypoglycemia.  Improved but persistent.   Patient Instructions  check your blood sugar 4 times a day: before the 3 meals, and at bedtime.  also check if you have symptoms of your blood sugar being too high or too low.  please keep a record of the readings and bring it to your next appointment here.  You can write it on any piece of paper.  please call us sooner if your blood sugar goes below 70, or if you have a lot of readings over 200.   Please reduce the lantus to 9 units daily, and take  it in the morning, and: reduce the novolog, 3 times a day (just before each meal; you take this insulin right before you eat, and only when you eat):  Less than 200: none.   200's: 1 unit.  Over 300: 2 units.  Please come back for a follow-up appointment in 6 weeks.

## 2014-03-15 ENCOUNTER — Encounter (INDEPENDENT_AMBULATORY_CARE_PROVIDER_SITE_OTHER): Payer: Self-pay

## 2014-03-17 DIAGNOSIS — I639 Cerebral infarction, unspecified: Secondary | ICD-10-CM

## 2014-03-17 HISTORY — DX: Cerebral infarction, unspecified: I63.9

## 2014-03-30 ENCOUNTER — Telehealth: Payer: Self-pay | Admitting: Endocrinology

## 2014-03-30 NOTE — Telephone Encounter (Signed)
Please continue the same lantus. Please increase the novolog to 5 units 3 times a day (just before each meal) Continue extra as-needed novolog. i'll see you soon, as scheduled.

## 2014-03-30 NOTE — Telephone Encounter (Signed)
Patient asked if you to call her.

## 2014-03-30 NOTE — Telephone Encounter (Signed)
Pt called stating her blood sugar readings have been running in the 300's, 400's and  500's for about 2 weeks. Pt stated there is no particular time when her sugar is at its highest it has been consistently high. Pt has been having to get up in the middle of the night due to frequent urination.  Pt confirmed that is taking 9 units of Lantus in the morning and 2-4 units of Novolog with each meal.  Please advise pt.  Thanks!

## 2014-03-31 NOTE — Telephone Encounter (Signed)
Pt advised of note below and voiced understanding.  

## 2014-04-05 ENCOUNTER — Ambulatory Visit (INDEPENDENT_AMBULATORY_CARE_PROVIDER_SITE_OTHER): Payer: Medicaid Other | Admitting: Endocrinology

## 2014-04-05 ENCOUNTER — Encounter: Payer: Self-pay | Admitting: Endocrinology

## 2014-04-05 VITALS — BP 126/82 | HR 85 | Temp 97.8°F | Ht 61.0 in | Wt 108.0 lb

## 2014-04-05 DIAGNOSIS — E1042 Type 1 diabetes mellitus with diabetic polyneuropathy: Secondary | ICD-10-CM

## 2014-04-05 LAB — HEMOGLOBIN A1C: HEMOGLOBIN A1C: 6.8 % — AB (ref 4.6–6.5)

## 2014-04-05 NOTE — Patient Instructions (Addendum)
check your blood sugar 4 times a day: before the 3 meals, and at bedtime.  also check if you have symptoms of your blood sugar being too high or too low.  please keep a record of the readings and bring it to your next appointment here.  You can write it on any piece of paper.  please call us sooner if your blood sugar goes below 70, or if you have a lot of readings over 200.   Please continue the lantus, 10 units daily, and take it in the morning, and: continue the novolog, 3 times a day (just before each meal; you take this insulin right before you eat, and only when you eat):  Less than 200: none.   200's: 1 unit.  Over 300: 2 units.  Please come back for a follow-up appointment in 2 months.

## 2014-04-05 NOTE — Progress Notes (Signed)
Subjective:    Patient ID: Veronica Moses, female    DOB: 04-14-1955, 59 y.o.   MRN: [id number]  HPI Pt returns for f/u of diabetes mellitus:  DM type: 1 Dx'ed: 5035 Complications: polyneuropathy and gastroparesis.   Therapy: insulin since dx DKA: only at time of dx Severe hypoglycemia: multiple episodes, most recently in Aug, 2015.   Pancreatitis: never Other: she did poorly on multiple daily injections.  She has done better on a simple insulin regimen.   Interval history: she brings a record of her cbg's which i have reviewed today.  It varies from 50-400, but most are in the 100's. There is no trend throughout the day.  She still takes lantus, 10 units qd.  pt states she feels well in general. Past Medical History  Diagnosis Date  . COPD (chronic obstructive pulmonary disease)   . Diabetes mellitus     Past Surgical History  Procedure Laterality Date  . Abdominal hysterectomy    . Nephrectomy    . Cholecystectomy    . Breast surgery    . Colonoscopy with esophagogastroduodenoscopy (egd) N/A 03/04/2013    Procedure: COLONOSCOPY WITH ESOPHAGOGASTRODUODENOSCOPY (EGD);  Surgeon: Rogene Houston, MD;  Location: [facility];  Service: Endoscopy;  Laterality: N/A;  1200-moved to [place] notified pt    History   Social History  . Marital Status: Married    Spouse Name: N/A    Number of Children: N/A  . Years of Education: N/A   Occupational History  . Not on file.   Social History Main Topics  . Smoking status: Former Smoker -- 1.00 packs/day for 26 years    Types: Cigarettes  . Smokeless tobacco: Former Systems developer    Quit date: 05/15/2012  . Alcohol Use: No  . Drug Use: No  . Sexual Activity: Not on file   Other Topics Concern  . Not on file   Social History Narrative    Current Outpatient Prescriptions on File Prior to Visit  Medication Sig Dispense Refill  . ALPRAZolam (XANAX) 1 MG tablet Take 1 mg by mouth 3 (three) times daily as needed. For anxiety    .  aspirin EC 81 MG tablet Take 81 mg by mouth daily.    . furosemide (LASIX) 40 MG tablet Take 40 mg by mouth.    Marland Kitchen glucagon (GLUCAGEN HYPOKIT) 1 MG SOLR injection Inject 1 mg into the skin once as needed for low blood sugar. 1 mg 10  . insulin aspart (NOVOLOG) 100 UNIT/ML injection Inject 5 Units into the skin 3 (three) times daily before meals. As directed per sliding scale    . insulin glargine (LANTUS) 100 UNIT/ML injection Inject 9 Units into the skin every morning.     . pantoprazole (PROTONIX) 40 MG tablet Take 40 mg by mouth 2 (two) times daily as needed (acid reflux).     No current facility-administered medications on file prior to visit.    Allergies  Allergen Reactions  . Demerol Other (See Comments)    hallucinations    Family History  Problem Relation Age of Onset  . Colon cancer Neg Hx   . CAD      family history  . Diabetes Father   . Diabetes Brother     BP 126/82 mmHg  Pulse 85  Temp(Src) 97.8 F (36.6 C) (Oral)  Ht 5\' 1"  (1.549 m)  Wt 108 lb (48.988 kg)  BMI 20.42 kg/m2  SpO2 96%    Review of  Systems Denies LOC and weight change.    Objective:   Physical Exam VITAL SIGNS:  See vs page GENERAL: no distress Pulses: dorsalis pedis intact bilat.   Feet: no deformity. no edema  Skin: no ulcer on the feet. normal color and temp.  Neuro: sensation is intact to touch on the feet, but decreased from normal.   Lab Results  Component Value Date   HGBA1C 6.8* 04/05/2014      Assessment & Plan:  Noncompliance with insulin dosing: I'll work around this as best I can. DM: overcontrolled, given this regimen, which does match insulin to her changing needs throughout the day.  i advised pt to reduce lantus to 9 units qam.    Patient is advised the following: Patient Instructions  check your blood sugar 4 times a day: before the 3 meals, and at bedtime.  also check if you have symptoms of your blood sugar being too high or too low.  please keep a record  of the readings and bring it to your next appointment here.  You can write it on any piece of paper.  please call us sooner if your blood sugar goes below 70, or if you have a lot of readings over 200.   Please continue the lantus, 10 units daily, and take it in the morning, and: continue the novolog, 3 times a day (just before each meal; you take this insulin right before you eat, and only when you eat):  Less than 200: none.   200's: 1 unit.  Over 300: 2 units.  Please come back for a follow-up appointment in 2 months.

## 2014-04-27 ENCOUNTER — Telehealth: Payer: Self-pay | Admitting: Endocrinology

## 2014-04-27 NOTE — Telephone Encounter (Signed)
Contacted pt. Pt states that since her office visit her blood fasting blood sugars have ben running in the 300's. Throughout the day her sugars fluctuate to the 400's and even 500's some days. Pt confirmed that she is taking 5 units of humalog 3/day and Lantus 9 units in the morning.  Please advise, Thanks!

## 2014-04-27 NOTE — Telephone Encounter (Signed)
Please increase novolog to 6 units, 3 times a day (just before each meal; you take this insulin right before you eat, and only when you eat) i'll see you next time.

## 2014-04-27 NOTE — Telephone Encounter (Signed)
Patient declined leaving a detailed message  Jinny Blossom please call patient    Thank you

## 2014-04-28 NOTE — Telephone Encounter (Signed)
Pt advised of note below and voiced understanding.  

## 2014-05-05 ENCOUNTER — Observation Stay (HOSPITAL_COMMUNITY)
Admission: EM | Admit: 2014-05-05 | Discharge: 2014-05-07 | Disposition: A | Discharge status: Home / Self Care | Payer: Medicaid Other | Attending: Pulmonary Disease | Admitting: Pulmonary Disease

## 2014-05-05 ENCOUNTER — Encounter (HOSPITAL_COMMUNITY): Payer: Self-pay | Admitting: *Deleted

## 2014-05-05 ENCOUNTER — Emergency Department (HOSPITAL_COMMUNITY): Payer: Medicaid Other

## 2014-05-05 DIAGNOSIS — J449 Chronic obstructive pulmonary disease, unspecified: Secondary | ICD-10-CM | POA: Diagnosis not present

## 2014-05-05 DIAGNOSIS — S4991XA Unspecified injury of right shoulder and upper arm, initial encounter: Principal | ICD-10-CM | POA: Insufficient documentation

## 2014-05-05 DIAGNOSIS — Z87891 Personal history of nicotine dependence: Secondary | ICD-10-CM | POA: Insufficient documentation

## 2014-05-05 DIAGNOSIS — R079 Chest pain, unspecified: Secondary | ICD-10-CM | POA: Diagnosis not present

## 2014-05-05 DIAGNOSIS — W19XXXA Unspecified fall, initial encounter: Secondary | ICD-10-CM | POA: Diagnosis present

## 2014-05-05 DIAGNOSIS — J441 Chronic obstructive pulmonary disease with (acute) exacerbation: Secondary | ICD-10-CM | POA: Diagnosis present

## 2014-05-05 DIAGNOSIS — Y92008 Other place in unspecified non-institutional (private) residence as the place of occurrence of the external cause: Secondary | ICD-10-CM | POA: Insufficient documentation

## 2014-05-05 DIAGNOSIS — S6991XA Unspecified injury of right wrist, hand and finger(s), initial encounter: Secondary | ICD-10-CM | POA: Insufficient documentation

## 2014-05-05 DIAGNOSIS — Z794 Long term (current) use of insulin: Secondary | ICD-10-CM | POA: Diagnosis not present

## 2014-05-05 DIAGNOSIS — Y998 Other external cause status: Secondary | ICD-10-CM | POA: Diagnosis not present

## 2014-05-05 DIAGNOSIS — Y9389 Activity, other specified: Secondary | ICD-10-CM | POA: Insufficient documentation

## 2014-05-05 DIAGNOSIS — E119 Type 2 diabetes mellitus without complications: Secondary | ICD-10-CM | POA: Diagnosis not present

## 2014-05-05 DIAGNOSIS — Z79899 Other long term (current) drug therapy: Secondary | ICD-10-CM | POA: Insufficient documentation

## 2014-05-05 DIAGNOSIS — Z87828 Personal history of other (healed) physical injury and trauma: Secondary | ICD-10-CM

## 2014-05-05 DIAGNOSIS — Z7982 Long term (current) use of aspirin: Secondary | ICD-10-CM | POA: Insufficient documentation

## 2014-05-05 DIAGNOSIS — W010XXA Fall on same level from slipping, tripping and stumbling without subsequent striking against object, initial encounter: Secondary | ICD-10-CM | POA: Diagnosis not present

## 2014-05-05 DIAGNOSIS — R0789 Other chest pain: Secondary | ICD-10-CM

## 2014-05-05 DIAGNOSIS — K219 Gastro-esophageal reflux disease without esophagitis: Secondary | ICD-10-CM | POA: Diagnosis present

## 2014-05-05 LAB — CBC WITH DIFFERENTIAL/PLATELET
Basophils Absolute: 0 10*3/uL (ref 0.0–0.1)
Basophils Relative: 0 % (ref 0–1)
EOS ABS: 0.1 10*3/uL (ref 0.0–0.7)
EOS PCT: 2 % (ref 0–5)
HCT: 35.2 % — ABNORMAL LOW (ref 36.0–46.0)
Hemoglobin: 11.6 g/dL — ABNORMAL LOW (ref 12.0–15.0)
Lymphocytes Relative: 33 % (ref 12–46)
Lymphs Abs: 2.4 10*3/uL (ref 0.7–4.0)
MCH: 29.3 pg (ref 26.0–34.0)
MCHC: 33 g/dL (ref 30.0–36.0)
MCV: 88.9 fL (ref 78.0–100.0)
MONOS PCT: 8 % (ref 3–12)
Monocytes Absolute: 0.6 10*3/uL (ref 0.1–1.0)
Neutro Abs: 4.1 10*3/uL (ref 1.7–7.7)
Neutrophils Relative %: 57 % (ref 43–77)
PLATELETS: 218 10*3/uL (ref 150–400)
RBC: 3.96 MIL/uL (ref 3.87–5.11)
RDW: 13.2 % (ref 11.5–15.5)
WBC: 7.3 10*3/uL (ref 4.0–10.5)

## 2014-05-05 LAB — COMPREHENSIVE METABOLIC PANEL
ALT: 20 U/L (ref 0–35)
AST: 29 U/L (ref 0–37)
Albumin: 4 g/dL (ref 3.5–5.2)
Alkaline Phosphatase: 33 U/L — ABNORMAL LOW (ref 39–117)
Anion gap: 7 (ref 5–15)
BUN: 19 mg/dL (ref 6–23)
CALCIUM: 9.2 mg/dL (ref 8.4–10.5)
CO2: 32 mmol/L (ref 19–32)
CREATININE: 1.01 mg/dL (ref 0.50–1.10)
Chloride: 100 mmol/L (ref 96–112)
GFR calc Af Amer: 69 mL/min — ABNORMAL LOW (ref >90)
GFR calc non Af Amer: 60 mL/min — ABNORMAL LOW (ref >90)
GLUCOSE: 95 mg/dL (ref 70–99)
Potassium: 3.5 mmol/L (ref 3.5–5.1)
SODIUM: 139 mmol/L (ref 135–145)
TOTAL PROTEIN: 6.9 g/dL (ref 6.0–8.3)
Total Bilirubin: 0.5 mg/dL (ref 0.3–1.2)

## 2014-05-05 LAB — CBG MONITORING, ED: Glucose-Capillary: 72 mg/dL (ref 70–99)

## 2014-05-05 LAB — GLUCOSE, CAPILLARY: GLUCOSE-CAPILLARY: 254 mg/dL — AB (ref 70–99)

## 2014-05-05 LAB — LIPASE, BLOOD: Lipase: 21 U/L (ref 11–59)

## 2014-05-05 LAB — TROPONIN I: Troponin I: <0.03 ng/mL (ref <0.031)

## 2014-05-05 MED ORDER — SERTRALINE HCL 50 MG PO TABS
50.0000 mg | ORAL_TABLET | Freq: Every day | ORAL | Status: DC
  Administered 2014-05-06 – 2014-05-07 (×2): 50 mg via ORAL
  Filled 2014-05-05 (×2): qty 1

## 2014-05-05 MED ORDER — ONDANSETRON HCL 4 MG/2ML IJ SOLN
4.0000 mg | Freq: Four times a day (QID) | INTRAMUSCULAR | Status: DC | PRN
  Administered 2014-05-05 (×2): 4 mg via INTRAVENOUS
  Filled 2014-05-05 (×3): qty 2

## 2014-05-05 MED ORDER — ENOXAPARIN SODIUM 40 MG/0.4ML ~~LOC~~ SOLN
40.0000 mg | SUBCUTANEOUS | Status: DC
  Administered 2014-05-06: 40 mg via SUBCUTANEOUS
  Filled 2014-05-05: qty 0.4

## 2014-05-05 MED ORDER — ALPRAZOLAM 1 MG PO TABS
1.0000 mg | ORAL_TABLET | Freq: Three times a day (TID) | ORAL | Status: DC | PRN
  Administered 2014-05-05 – 2014-05-06 (×2): 1 mg via ORAL
  Filled 2014-05-05 (×2): qty 1

## 2014-05-05 MED ORDER — HYDROMORPHONE HCL 1 MG/ML IJ SOLN
1.0000 mg | Freq: Once | INTRAMUSCULAR | Status: AC
  Administered 2014-05-05: 1 mg via INTRAMUSCULAR
  Filled 2014-05-05: qty 1

## 2014-05-05 MED ORDER — DOCUSATE SODIUM 100 MG PO CAPS: 100.0000 mg | ORAL_CAPSULE | Freq: Every day | ORAL | Status: DC | PRN

## 2014-05-05 MED ORDER — FUROSEMIDE 40 MG PO TABS
40.0000 mg | ORAL_TABLET | Freq: Every day | ORAL | Status: DC
  Administered 2014-05-06 – 2014-05-07 (×2): 40 mg via ORAL
  Filled 2014-05-05 (×2): qty 1

## 2014-05-05 MED ORDER — SERTRALINE HCL 50 MG PO TABS: 50.0000 mg | ORAL_TABLET | Freq: Every day | ORAL | Status: DC

## 2014-05-05 MED ORDER — PANTOPRAZOLE SODIUM 40 MG PO TBEC: 40.0000 mg | DELAYED_RELEASE_TABLET | Freq: Two times a day (BID) | ORAL | Status: DC | PRN

## 2014-05-05 MED ORDER — NITROGLYCERIN 0.4 MG SL SUBL
0.4000 mg | SUBLINGUAL_TABLET | SUBLINGUAL | Status: DC | PRN
  Administered 2014-05-05: 0.4 mg via SUBLINGUAL
  Filled 2014-05-05: qty 1

## 2014-05-05 MED ORDER — FENTANYL CITRATE 0.05 MG/ML IJ SOLN
25.0000 ug | Freq: Once | INTRAMUSCULAR | Status: AC
  Administered 2014-05-05: 25 ug via INTRAMUSCULAR

## 2014-05-05 MED ORDER — INSULIN ASPART 100 UNIT/ML ~~LOC~~ SOLN
5.0000 [IU] | Freq: Three times a day (TID) | SUBCUTANEOUS | Status: DC
  Administered 2014-05-06 – 2014-05-07 (×3): 5 [IU] via SUBCUTANEOUS

## 2014-05-05 MED ORDER — FENTANYL CITRATE 0.05 MG/ML IJ SOLN
25.0000 ug | Freq: Once | INTRAMUSCULAR | Status: DC
Start: 2014-05-05 — End: 2014-05-05
  Filled 2014-05-05: qty 2

## 2014-05-05 MED ORDER — KETOROLAC TROMETHAMINE 30 MG/ML IJ SOLN
30.0000 mg | Freq: Once | INTRAMUSCULAR | Status: DC
  Filled 2014-05-05: qty 1

## 2014-05-05 MED ORDER — ALUM & MAG HYDROXIDE-SIMETH 200-200-20 MG/5ML PO SUSP: 30.0000 mL | Freq: Four times a day (QID) | ORAL | Status: DC | PRN

## 2014-05-05 MED ORDER — CYCLOSPORINE 0.05 % OP EMUL
1.0000 [drp] | Freq: Two times a day (BID) | OPHTHALMIC | Status: DC
  Administered 2014-05-06 – 2014-05-07 (×3): 1 [drp] via OPHTHALMIC
  Filled 2014-05-05 (×6): qty 1

## 2014-05-05 MED ORDER — INSULIN ASPART 100 UNIT/ML ~~LOC~~ SOLN
0.0000 [IU] | Freq: Three times a day (TID) | SUBCUTANEOUS | Status: DC
  Administered 2014-05-06: 15 [IU] via SUBCUTANEOUS

## 2014-05-05 MED ORDER — ASPIRIN EC 81 MG PO TBEC: 81.0000 mg | DELAYED_RELEASE_TABLET | Freq: Every day | ORAL | Status: DC

## 2014-05-05 MED ORDER — SODIUM CHLORIDE 0.9 % IJ SOLN
3.0000 mL | Freq: Two times a day (BID) | INTRAMUSCULAR | Status: DC
  Administered 2014-05-05 – 2014-05-06 (×3): 3 mL via INTRAVENOUS

## 2014-05-05 MED ORDER — INSULIN GLARGINE 100 UNIT/ML ~~LOC~~ SOLN
9.0000 [IU] | Freq: Every day | SUBCUTANEOUS | Status: DC
  Administered 2014-05-06: 9 [IU] via SUBCUTANEOUS
  Filled 2014-05-05 (×3): qty 0.09

## 2014-05-05 MED ORDER — ASPIRIN EC 81 MG PO TBEC
81.0000 mg | DELAYED_RELEASE_TABLET | Freq: Every day | ORAL | Status: DC
  Administered 2014-05-06 – 2014-05-07 (×2): 81 mg via ORAL
  Filled 2014-05-05 (×2): qty 1

## 2014-05-05 MED ORDER — FUROSEMIDE 40 MG PO TABS: 40.0000 mg | ORAL_TABLET | Freq: Every day | ORAL | Status: DC

## 2014-05-05 MED ORDER — FENTANYL CITRATE 0.05 MG/ML IJ SOLN: 50.0000 ug | INTRAMUSCULAR | Status: DC | PRN

## 2014-05-05 MED ORDER — ACETAMINOPHEN 650 MG RE SUPP: 650.0000 mg | Freq: Four times a day (QID) | RECTAL | Status: DC | PRN

## 2014-05-05 MED ORDER — KETOROLAC TROMETHAMINE 30 MG/ML IJ SOLN
30.0000 mg | Freq: Once | INTRAMUSCULAR | Status: AC
  Administered 2014-05-05: 30 mg via INTRAMUSCULAR

## 2014-05-05 MED ORDER — ACETAMINOPHEN 325 MG PO TABS
650.0000 mg | ORAL_TABLET | Freq: Four times a day (QID) | ORAL | Status: DC | PRN
  Administered 2014-05-05: 650 mg via ORAL
  Filled 2014-05-05: qty 2

## 2014-05-05 NOTE — ED Notes (Signed)
Resp rate incorrect on monitor. RR 14.

## 2014-05-05 NOTE — ED Provider Notes (Signed)
CSN: [id number]     Arrival date & time 05/05/14  1101 History  This chart was scribed for Veronica Muskrat, MD by Edison Simon, ED Scribe. This patient was seen in room [facility] and the patient's care was started at 12:24 PM.    Chief Complaint  Patient presents with  . Arm Pain   The history is provided by the patient. No language interpreter was used.    HPI Comments: Veronica Moses is a 59 y.o. female who presents to the Emergency Department complaining of pain to right shoulder and arm status post falling yesterday. She reports pain mostly to shoulder and wrist but states she does have pain in her elbow and down to her fingers. She states she was playing with her grandchildren when her foot caught on a hobby horse and she fell forward and tried to catch herself with her right arm. She denies injuries besides to her right arm. She states she has been using Tylenol for pain. She reports history of COPD and DM but denies other health problems. She states she quit smoking 3 years ago.   Past Medical History  Diagnosis Date  . COPD (chronic obstructive pulmonary disease)   . Diabetes mellitus    Past Surgical History  Procedure Laterality Date  . Abdominal hysterectomy    . Nephrectomy    . Cholecystectomy    . Breast surgery    . Colonoscopy with esophagogastroduodenoscopy (egd) N/A 03/04/2013    Procedure: COLONOSCOPY WITH ESOPHAGOGASTRODUODENOSCOPY (EGD);  Surgeon: Rogene Houston, MD;  Location: [facility];  Service: Endoscopy;  Laterality: N/A;  1200-moved to [place] notified pt   Family History  Problem Relation Age of Onset  . Colon cancer Neg Hx   . CAD      family history  . Diabetes Father   . Diabetes Brother    History  Substance Use Topics  . Smoking status: Former Smoker -- 1.00 packs/day for 26 years    Types: Cigarettes  . Smokeless tobacco: Former Systems developer    Quit date: 05/15/2012  . Alcohol Use: No   OB History    No data available     Review of  Systems  Constitutional:       Per HPI, otherwise negative  HENT:       Per HPI, otherwise negative  Respiratory:       Per HPI, otherwise negative  Cardiovascular:       Per HPI, otherwise negative  Gastrointestinal: Negative for vomiting.  Endocrine:       Negative aside from HPI  Genitourinary:       Neg aside from HPI   Musculoskeletal:       Right and shoulder pain Per HPI, otherwise negative  Skin: Negative.   Neurological: Negative for syncope.      Allergies  Demerol  Home Medications   Prior to Admission medications   Medication Sig Start Date End Date Taking? Authorizing Provider  ALPRAZolam Duanne Moron) 1 MG tablet Take 1 mg by mouth 3 (three) times daily as needed. For anxiety    Historical Provider, MD  aspirin EC 81 MG tablet Take 81 mg by mouth daily.    Historical Provider, MD  furosemide (LASIX) 40 MG tablet Take 40 mg by mouth.    Historical Provider, MD  glucagon (GLUCAGEN HYPOKIT) 1 MG SOLR injection Inject 1 mg into the skin once as needed for low blood sugar. 12/21/13   Renato Shin, MD  insulin aspart (NOVOLOG)  100 UNIT/ML injection Inject 5 Units into the skin 3 (three) times daily before meals. As directed per sliding scale    Historical Provider, MD  insulin glargine (LANTUS) 100 UNIT/ML injection Inject 9 Units into the skin every morning.     Historical Provider, MD  pantoprazole (PROTONIX) 40 MG tablet Take 40 mg by mouth 2 (two) times daily as needed (acid reflux). 03/04/13   Rogene Houston, MD   BP 130/50 mmHg  Pulse 67  Temp(Src) 98.3 F (36.8 C) (Oral)  Resp 9  Ht 5\' 1"  (1.549 m)  Wt 107 lb (48.535 kg)  BMI 20.23 kg/m2  SpO2 100% Physical Exam  Constitutional: She is oriented to person, place, and time. She appears well-developed and well-nourished. No distress.  HENT:  Head: Normocephalic and atraumatic.  Eyes: Conjunctivae and EOM are normal.  Cardiovascular: Normal rate and regular rhythm.   Pulmonary/Chest: Effort normal and breath  sounds normal. No stridor. No respiratory distress.  Abdominal: She exhibits no distension.  Musculoskeletal: She exhibits no edema.  Right shoulder tenderness to palpation anteriorly, superiorly, and laterally Difficulty moving due to pain Tenderness all over the wrist  Neurological: She is alert and oriented to person, place, and time. No cranial nerve deficit.  Skin: Skin is warm and dry.  Psychiatric: She has a normal mood and affect.  Nursing note and vitals reviewed.   ED Course  Procedures (including critical care time)  DIAGNOSTIC STUDIES: Oxygen Saturation is 100% on room air, normal by my interpretation.    COORDINATION OF CARE: 4:09 PM Discussed treatment plan with patient at beside, the patient agrees with the plan and has no further questions at this time.   Labs Review Labs Reviewed  COMPREHENSIVE METABOLIC PANEL - Abnormal; Notable for the following:    Alkaline Phosphatase 33 (*)    GFR calc non Af Amer 60 (*)    GFR calc Af Amer 69 (*)    All other components within normal limits  CBC WITH DIFFERENTIAL/PLATELET - Abnormal; Notable for the following:    Hemoglobin 11.6 (*)    HCT 35.2 (*)    All other components within normal limits  LIPASE, BLOOD  TROPONIN I  CBG MONITORING, ED    Imaging Review Dg Shoulder Right  05/05/2014   CLINICAL DATA:  Trip and fall with right shoulder pain, initial encounter  EXAM: RIGHT SHOULDER - 2+ VIEW  COMPARISON:  None.  FINDINGS: There is no evidence of fracture or dislocation. There is no evidence of arthropathy or other focal bone abnormality. Soft tissues are unremarkable.  IMPRESSION: No acute abnormality noted.   Electronically Signed   By: Inez Catalina M.D.   On: 05/05/2014 13:30   Dg Elbow Complete Right  05/05/2014   CLINICAL DATA:  Trip and fall with right elbow pain, initial encounter  EXAM: RIGHT ELBOW - COMPLETE 3+ VIEW  COMPARISON:  None.  FINDINGS: There is no evidence of fracture, dislocation, or joint  effusion. There is no evidence of arthropathy or other focal bone abnormality. Soft tissues are unremarkable.  IMPRESSION: No acute abnormality is noted.   Electronically Signed   By: Inez Catalina M.D.   On: 05/05/2014 13:33   Dg Wrist Complete Right  05/05/2014   CLINICAL DATA:  Trip and fall with right wrist pain, initial encounter  EXAM: RIGHT WRIST - COMPLETE 3+ VIEW  COMPARISON:  None.  FINDINGS: No acute fracture dislocation is noted. Mild irregularity of the scaphoid is noted laterally which appears  to be projectional in nature. No definitive fracture is seen. No gross soft tissue abnormality is noted.  IMPRESSION: No definitive fracture identified.   Electronically Signed   By: Inez Catalina M.D.   On: 05/05/2014 13:32   on return from x-ray the patient developed chest pain. Patient was anterior, sternal, nonradiating, sore. There is associated nausea.   I reviewed the x-ray findings myself, agree with the interpretation.    EKG Interpretation   Date/Time:  Friday May 05 2014 13:42:46 EST Ventricular Rate:  69 PR Interval:  122 QRS Duration: 87 QT Interval:  392 QTC Calculation: 420 R Axis:   121 Text Interpretation:  Right and left arm electrode reversal,  interpretation assumes no reversal Sinus rhythm Right axis deviation  Abnormal R-wave progression, early transition Abnormal T, consider  ischemia, lateral leads Baseline wander in lead(s) V6 Sinus rhythm T wave  abnormality Rightward axis Abnormal ekg Confirmed by Veronica Muskrat  MD  ([id number]) on 05/05/2014 1:51:15 PM     4:09 PM Chest pain has reduced, but remains present.  Discussed the patient's prior evaluation, and she has had stress test within the past year.  With persistent chest pain in spite of Dilaudid, fentanyl, Toradol, patient we admitted for further evaluation and management.  MDM   Patient presents for evaluation of pain throughout the right upper extremity after a fall that occurred  yesterday. These studies were unremarkable, but during her evaluation the patient developed chest pain, which has been only moderately controlled. Patient has risk factors including long smoking history, diabetes. EKG had T-wave inversions laterally.  Though the patient had an echocardiogram was in the past year, given her persistent chest pain, she will be admitted for further evaluation and management.  I personally performed the services described in this documentation, which was scribed in my presence. The recorded information has been reviewed and is accurate.     Veronica Muskrat, MD 05/05/14 [phone]

## 2014-05-05 NOTE — ED Notes (Signed)
Tripped and fell yesterday,  Pain rt shoulder and arm

## 2014-05-05 NOTE — ED Notes (Signed)
Dr. Vanita Panda informed of pt's chest pain.

## 2014-05-05 NOTE — H&P (Signed)
History and Physical  Veronica Moses LGX:[id number] DOB: [birth year] DOA: 05/05/2014  Referring physician: Dr Vanita Panda, ED physician PCP: Alonza Bogus, MD   Chief Complaint: Chest pain  HPI: Veronica Moses is a 59 y.o. female  With a history of diabetes on insulin with diabetic neuropathy, COPD, GERD. Patient was seen today in the emergency department for right shoulder pain after fall. During the ER visit, the patient developed sharp, stabbing left-sided nonradiating constant chest pain with nausea 20 minutes after an IM dose of Dilaudid. After nitroglycerin and fentanyl, the patient's chest pain improved moderately, however the nausea persists. Her current chest pain is approximately 5 out of 10.  Review of Systems:   Pt complains of right shoulder pain, left chest pain, nausea, chronic leg pain.  Pt denies any fevers, chills, vomiting, diarrhea, constipation, palpitations, shortness of breath, abdominal pain, dizziness, lightheadedness, blurred vision. Review of systems are otherwise negative  Past Medical History  Diagnosis Date  . COPD (chronic obstructive pulmonary disease)   . Diabetes mellitus    Past Surgical History  Procedure Laterality Date  . Abdominal hysterectomy    . Nephrectomy    . Cholecystectomy    . Breast surgery    . Colonoscopy with esophagogastroduodenoscopy (egd) N/A 03/04/2013    Procedure: COLONOSCOPY WITH ESOPHAGOGASTRODUODENOSCOPY (EGD);  Surgeon: Rogene Houston, MD;  Location: [facility];  Service: Endoscopy;  Laterality: N/A;  1200-moved to [place] notified pt   Social History:  reports that she has quit smoking. Her smoking use included Cigarettes. She has a 26 pack-year smoking history. She quit smokeless tobacco use about 1 years ago. She reports that she does not drink alcohol or use illicit drugs. Patient lives at home & is able to participate in activities of daily living  Allergies  Allergen Reactions  . Demerol Other (See Comments)      hallucinations  . Dilaudid [Hydromorphone Hcl] Nausea And Vomiting    Family History  Problem Relation Age of Onset  . Colon cancer Neg Hx   . CAD      family history  . Diabetes Father   . Diabetes Brother       Prior to Admission medications   Medication Sig Start Date End Date Taking? Authorizing Provider  acetaminophen (TYLENOL) 500 MG tablet Take 1,000 mg by mouth every 6 (six) hours as needed for mild pain or moderate pain.   Yes Historical Provider, MD  ALPRAZolam Duanne Moron) 1 MG tablet Take 1 mg by mouth 3 (three) times daily as needed. For anxiety   Yes Historical Provider, MD  aspirin EC 81 MG tablet Take 81 mg by mouth daily.   Yes Historical Provider, MD  furosemide (LASIX) 40 MG tablet Take 40 mg by mouth daily.    Yes Historical Provider, MD  insulin aspart (NOVOLOG) 100 UNIT/ML injection Inject 5 Units into the skin 3 (three) times daily before meals. As directed per sliding scale starting at 5 units   Yes Historical Provider, MD  insulin glargine (LANTUS) 100 UNIT/ML injection Inject 9 Units into the skin every morning.    Yes Historical Provider, MD  pantoprazole (PROTONIX) 40 MG tablet Take 40 mg by mouth 2 (two) times daily as needed (acid reflux). 03/04/13  Yes Rogene Houston, MD  RESTASIS 0.05 % ophthalmic emulsion Apply 1 drop to eye 2 (two) times daily. 04/29/14  Yes Historical Provider, MD  sertraline (ZOLOFT) 50 MG tablet Take 50 mg by mouth daily. 03/28/14  Yes Historical  Provider, MD  glucagon (GLUCAGEN HYPOKIT) 1 MG SOLR injection Inject 1 mg into the skin once as needed for low blood sugar. 12/21/13   Renato Shin, MD    Physical Exam: BP 131/57 mmHg  Pulse 68  Temp(Src) 98.3 F (36.8 C) (Oral)  Resp 21  Ht 5\' 1"  (1.549 m)  Wt 48.535 kg (107 lb)  BMI 20.23 kg/m2  SpO2 100%  General: Middle-aged Caucasian female. Awake and alert and oriented x3. No acute cardiopulmonary distress.  Eyes: Pupils equal, round, reactive to light. Extraocular muscles are  intact. Sclerae anicteric and noninjected.  ENT:  Moist mucosal membranes. No mucosal lesions.  Neck: Neck supple without lymphadenopathy. No carotid bruits. No masses palpated.  Cardiovascular: Regular rate with normal S1-S2 sounds. No murmurs, rubs, gallops auscultated. No JVD.  Respiratory: Good respiratory effort with no wheezes, rales, rhonchi. Lungs clear to auscultation bilaterally.  Abdomen: Soft, nontender, nondistended. Active bowel sounds. No masses or hepatosplenomegaly  Skin: Dry, warm to touch. 2+ dorsalis pedis and radial pulses. Musculoskeletal: No calf or leg pain. All major joints not erythematous nontender.  Psychiatric: Intact judgment and insight.  Neurologic: No focal neurological deficits. Cranial nerves II through XII are grossly intact.           Labs on Admission:  Basic Metabolic Panel:  Recent Labs Lab 05/05/14 1421  NA 139  K 3.5  CL 100  CO2 32  GLUCOSE 95  BUN 19  CREATININE 1.01  CALCIUM 9.2   Liver Function Tests:  Recent Labs Lab 05/05/14 1421  AST 29  ALT 20  ALKPHOS 33*  BILITOT 0.5  PROT 6.9  ALBUMIN 4.0    Recent Labs Lab 05/05/14 1421  LIPASE 21   No results for input(s): AMMONIA in the last 168 hours. CBC:  Recent Labs Lab 05/05/14 1421  WBC 7.3  NEUTROABS 4.1  HGB 11.6*  HCT 35.2*  MCV 88.9  PLT 218   Cardiac Enzymes:  Recent Labs Lab 05/05/14 1421  TROPONINI <0.03    BNP (last 3 results) No results for input(s): BNP in the last 8760 hours.  ProBNP (last 3 results) No results for input(s): PROBNP in the last 8760 hours.  CBG:  Recent Labs Lab 05/05/14 1342  GLUCAP 72    Radiological Exams on Admission: Dg Shoulder Right  05/05/2014   CLINICAL DATA:  Trip and fall with right shoulder pain, initial encounter  EXAM: RIGHT SHOULDER - 2+ VIEW  COMPARISON:  None.  FINDINGS: There is no evidence of fracture or dislocation. There is no evidence of arthropathy or other focal bone abnormality. Soft  tissues are unremarkable.  IMPRESSION: No acute abnormality noted.   Electronically Signed   By: Inez Catalina M.D.   On: 05/05/2014 13:30   Dg Elbow Complete Right  05/05/2014   CLINICAL DATA:  Trip and fall with right elbow pain, initial encounter  EXAM: RIGHT ELBOW - COMPLETE 3+ VIEW  COMPARISON:  None.  FINDINGS: There is no evidence of fracture, dislocation, or joint effusion. There is no evidence of arthropathy or other focal bone abnormality. Soft tissues are unremarkable.  IMPRESSION: No acute abnormality is noted.   Electronically Signed   By: Inez Catalina M.D.   On: 05/05/2014 13:33   Dg Wrist Complete Right  05/05/2014   CLINICAL DATA:  Trip and fall with right wrist pain, initial encounter  EXAM: RIGHT WRIST - COMPLETE 3+ VIEW  COMPARISON:  None.  FINDINGS: No acute fracture dislocation is noted. Mild  irregularity of the scaphoid is noted laterally which appears to be projectional in nature. No definitive fracture is seen. No gross soft tissue abnormality is noted.  IMPRESSION: No definitive fracture identified.   Electronically Signed   By: Inez Catalina M.D.   On: 05/05/2014 13:32    EKG: Independently reviewed.  Sinus rhythm. Inverted T waves in lead 1 and V1 which are new when compared to EKG in 10/25/2013. No acute ST elevation or depression  Assessment/Plan Present on Admission:  . Chest pain  #1 chest pain #2 diabetes insulin-dependent #3 COPD #4 GERD #5 anxiety  Patient's chest pain is likely due to medication reaction and less likely to be the result of ACS. However, the patient does have risk factors including diabetes. Additionally, if her symptoms were caused by medication reaction, it is prudent to monitor the patient to assure resolution.   Admit for observation to Dr. Luan Pulling to rule out MI Serial troponins Morning labs Sliding scale to cover elevated blood sugars as needed Zofran for nausea  DVT prophylaxis: Lovenox  Consultants: None  Code Status: Full  code  Family Communication: Husband in the room   Disposition Plan: Home following evaluation  Time spent: 50 minutes was spent with face-to-face time with patient with at least 50% with counseling and coordination of care  Truett Mainland, DO [facility] Pager [phone]

## 2014-05-05 NOTE — ED Notes (Signed)
Pt reports relief of chest discomfort.

## 2014-05-05 NOTE — ED Notes (Signed)
Pt states she has started having some chest pain. Thinks it is from the pain med. Pt being hooked up to monitor and EKG being done. CBG check requested.

## 2014-05-05 NOTE — ED Notes (Signed)
Report given to floor. VSS. Pt ready for transport.

## 2014-05-06 LAB — BASIC METABOLIC PANEL
ANION GAP: 5 (ref 5–15)
BUN: 23 mg/dL (ref 6–23)
CALCIUM: 8.7 mg/dL (ref 8.4–10.5)
CHLORIDE: 96 mmol/L (ref 96–112)
CO2: 30 mmol/L (ref 19–32)
Creatinine, Ser: 1.12 mg/dL — ABNORMAL HIGH (ref 0.50–1.10)
GFR calc non Af Amer: 53 mL/min — ABNORMAL LOW (ref >90)
GFR, EST AFRICAN AMERICAN: 61 mL/min — AB (ref >90)
Glucose, Bld: 391 mg/dL — ABNORMAL HIGH (ref 70–99)
Potassium: 4.5 mmol/L (ref 3.5–5.1)
Sodium: 131 mmol/L — ABNORMAL LOW (ref 135–145)

## 2014-05-06 LAB — GLUCOSE, CAPILLARY
GLUCOSE-CAPILLARY: 143 mg/dL — AB (ref 70–99)
GLUCOSE-CAPILLARY: 127 mg/dL — AB (ref 70–99)
Glucose-Capillary: 371 mg/dL — ABNORMAL HIGH (ref 70–99)
Glucose-Capillary: 57 mg/dL — ABNORMAL LOW (ref 70–99)
Glucose-Capillary: 134 mg/dL — ABNORMAL HIGH (ref 70–99)
Glucose-Capillary: 301 mg/dL — ABNORMAL HIGH (ref 70–99)

## 2014-05-06 LAB — TROPONIN I: Troponin I: <0.03 ng/mL (ref <0.031)

## 2014-05-06 MED ORDER — ONDANSETRON 8 MG/NS 50 ML IVPB
8.0000 mg | Freq: Four times a day (QID) | INTRAVENOUS | Status: DC | PRN
Start: 2014-05-06 — End: 2014-05-07
  Administered 2014-05-06: 8 mg via INTRAVENOUS
  Filled 2014-05-06 (×3): qty 8

## 2014-05-06 MED ORDER — INSULIN ASPART 100 UNIT/ML ~~LOC~~ SOLN
0.0000 [IU] | Freq: Three times a day (TID) | SUBCUTANEOUS | Status: DC
  Administered 2014-05-06: 2 [IU] via SUBCUTANEOUS
  Administered 2014-05-07: 11 [IU] via SUBCUTANEOUS

## 2014-05-06 MED ORDER — INSULIN ASPART 100 UNIT/ML ~~LOC~~ SOLN
0.0000 [IU] | Freq: Every day | SUBCUTANEOUS | Status: DC
  Administered 2014-05-06: 4 [IU] via SUBCUTANEOUS

## 2014-05-06 NOTE — Progress Notes (Signed)
Utilization Review completed.  

## 2014-05-06 NOTE — Progress Notes (Signed)
Subjective: She says she still feels very badly. She is having pain in her chest. So far she is negative as far as troponin level is concerned. She's having significant nausea.  Objective: Vital signs in last 24 hours: Temp:  [97.4 F (36.3 C)-98.6 F (37 C)] 98.1 F (36.7 C) (02/20 8003) Pulse Rate:  [61-82] 62 (02/20 0633) Resp:  [6-21] 16 (02/20 0633) BP: (107-152)/(46-90) 125/51 mmHg (02/20 0633) SpO2:  [94 %-100 %] 94 % (02/20 0633) Weight:  [48.535 kg (107 lb)-50.5 kg (111 lb 5.3 oz)] 50.5 kg (111 lb 5.3 oz) (02/19 1808) Weight change:     Intake/Output from previous day: 02/19 0701 - 02/20 0700 In: 240 [P.O.:240] Out: -   PHYSICAL EXAM General appearance: alert, cooperative and moderate distress Resp: clear to auscultation bilaterally Cardio: regular rate and rhythm, S1, S2 normal, no murmur, click, rub or gallop GI: soft, non-tender; bowel sounds normal; no masses,  no organomegaly Extremities: Her right arm is in a sling  Lab Results:  Results for orders placed or performed during the hospital encounter of 05/05/14 (from the past 48 hour(s))  CBG monitoring, ED     Status: None   Collection Time: 05/05/14  1:42 PM  Result Value Ref Range   Glucose-Capillary 72 70 - 99 mg/dL   Comment 1 Notify RN   Comprehensive metabolic panel     Status: Abnormal   Collection Time: 05/05/14  2:21 PM  Result Value Ref Range   Sodium 139 135 - 145 mmol/L   Potassium 3.5 3.5 - 5.1 mmol/L   Chloride 100 96 - 112 mmol/L   CO2 32 19 - 32 mmol/L   Glucose, Bld 95 70 - 99 mg/dL   BUN 19 6 - 23 mg/dL   Creatinine, Ser 1.01 0.50 - 1.10 mg/dL   Calcium 9.2 8.4 - 10.5 mg/dL   Total Protein 6.9 6.0 - 8.3 g/dL   Albumin 4.0 3.5 - 5.2 g/dL   AST 29 0 - 37 U/L   ALT 20 0 - 35 U/L   Alkaline Phosphatase 33 (L) 39 - 117 U/L   Total Bilirubin 0.5 0.3 - 1.2 mg/dL   GFR calc non Af Amer 60 (L) >90 mL/min   GFR calc Af Amer 69 (L) >90 mL/min    Comment: (NOTE) The eGFR has been calculated  using the CKD EPI equation. This calculation has not been validated in all clinical situations. eGFR's persistently <90 mL/min signify possible Chronic Kidney Disease.    Anion gap 7 5 - 15  Lipase, blood     Status: None   Collection Time: 05/05/14  2:21 PM  Result Value Ref Range   Lipase 21 11 - 59 U/L  Troponin I     Status: None   Collection Time: 05/05/14  2:21 PM  Result Value Ref Range   Troponin I <0.03 <0.031 ng/mL    Comment:        NO INDICATION OF MYOCARDIAL INJURY.   CBC with Differential     Status: Abnormal   Collection Time: 05/05/14  2:21 PM  Result Value Ref Range   WBC 7.3 4.0 - 10.5 K/uL   RBC 3.96 3.87 - 5.11 MIL/uL   Hemoglobin 11.6 (L) 12.0 - 15.0 g/dL   HCT 35.2 (L) 36.0 - 46.0 %   MCV 88.9 78.0 - 100.0 fL   MCH 29.3 26.0 - 34.0 pg   MCHC 33.0 30.0 - 36.0 g/dL   RDW 13.2 11.5 - 15.5 %  Platelets 218 150 - 400 K/uL   Neutrophils Relative % 57 43 - 77 %   Neutro Abs 4.1 1.7 - 7.7 K/uL   Lymphocytes Relative 33 12 - 46 %   Lymphs Abs 2.4 0.7 - 4.0 K/uL   Monocytes Relative 8 3 - 12 %   Monocytes Absolute 0.6 0.1 - 1.0 K/uL   Eosinophils Relative 2 0 - 5 %   Eosinophils Absolute 0.1 0.0 - 0.7 K/uL   Basophils Relative 0 0 - 1 %   Basophils Absolute 0.0 0.0 - 0.1 K/uL  Glucose, capillary     Status: Abnormal   Collection Time: 05/05/14  9:15 PM  Result Value Ref Range   Glucose-Capillary 254 (H) 70 - 99 mg/dL   Comment 1 Notify RN    Comment 2 Document in Chart   Troponin I     Status: None   Collection Time: 05/05/14 10:05 PM  Result Value Ref Range   Troponin I <0.03 <0.031 ng/mL    Comment:        NO INDICATION OF MYOCARDIAL INJURY.   Troponin I     Status: None   Collection Time: 05/06/14  4:42 AM  Result Value Ref Range   Troponin I <0.03 <0.031 ng/mL    Comment:        NO INDICATION OF MYOCARDIAL INJURY.   Basic metabolic panel     Status: Abnormal   Collection Time: 05/06/14  4:42 AM  Result Value Ref Range   Sodium 131 (L)  135 - 145 mmol/L    Comment: DELTA CHECK NOTED RESULT REPEATED AND VERIFIED    Potassium 4.5 3.5 - 5.1 mmol/L    Comment: DELTA CHECK NOTED RESULT REPEATED AND VERIFIED    Chloride 96 96 - 112 mmol/L   CO2 30 19 - 32 mmol/L   Glucose, Bld 391 (H) 70 - 99 mg/dL   BUN 23 6 - 23 mg/dL   Creatinine, Ser 1.12 (H) 0.50 - 1.10 mg/dL   Calcium 8.7 8.4 - 10.5 mg/dL   GFR calc non Af Amer 53 (L) >90 mL/min   GFR calc Af Amer 61 (L) >90 mL/min    Comment: (NOTE) The eGFR has been calculated using the CKD EPI equation. This calculation has not been validated in all clinical situations. eGFR's persistently <90 mL/min signify possible Chronic Kidney Disease.    Anion gap 5 5 - 15  Glucose, capillary     Status: Abnormal   Collection Time: 05/06/14  7:39 AM  Result Value Ref Range   Glucose-Capillary 371 (H) 70 - 99 mg/dL   Comment 1 Notify RN   Troponin I     Status: None   Collection Time: 05/06/14 10:14 AM  Result Value Ref Range   Troponin I <0.03 <0.031 ng/mL    Comment:        NO INDICATION OF MYOCARDIAL INJURY.   Glucose, capillary     Status: Abnormal   Collection Time: 05/06/14 11:19 AM  Result Value Ref Range   Glucose-Capillary 143 (H) 70 - 99 mg/dL   Comment 1 Notify RN     ABGS No results for input(s): PHART, PO2ART, TCO2, HCO3 in the last 72 hours.  Invalid input(s): PCO2 CULTURES No results found for this or any previous visit (from the past 240 hour(s)). Studies/Results: Dg Shoulder Right  05/05/2014   CLINICAL DATA:  Trip and fall with right shoulder pain, initial encounter  EXAM: RIGHT SHOULDER - 2+ VIEW  COMPARISON:  None.  FINDINGS: There is no evidence of fracture or dislocation. There is no evidence of arthropathy or other focal bone abnormality. Soft tissues are unremarkable.  IMPRESSION: No acute abnormality noted.   Electronically Signed   By: Inez Catalina M.D.   On: 05/05/2014 13:30   Dg Elbow Complete Right  05/05/2014   CLINICAL DATA:  Trip and fall  with right elbow pain, initial encounter  EXAM: RIGHT ELBOW - COMPLETE 3+ VIEW  COMPARISON:  None.  FINDINGS: There is no evidence of fracture, dislocation, or joint effusion. There is no evidence of arthropathy or other focal bone abnormality. Soft tissues are unremarkable.  IMPRESSION: No acute abnormality is noted.   Electronically Signed   By: Inez Catalina M.D.   On: 05/05/2014 13:33   Dg Wrist Complete Right  05/05/2014   CLINICAL DATA:  Trip and fall with right wrist pain, initial encounter  EXAM: RIGHT WRIST - COMPLETE 3+ VIEW  COMPARISON:  None.  FINDINGS: No acute fracture dislocation is noted. Mild irregularity of the scaphoid is noted laterally which appears to be projectional in nature. No definitive fracture is seen. No gross soft tissue abnormality is noted.  IMPRESSION: No definitive fracture identified.   Electronically Signed   By: Inez Catalina M.D.   On: 05/05/2014 13:32    Medications:  Prior to Admission:  Prescriptions prior to admission  Medication Sig Dispense Refill Last Dose  . acetaminophen (TYLENOL) 500 MG tablet Take 1,000 mg by mouth every 6 (six) hours as needed for mild pain or moderate pain.   05/05/2014 at morning  . ALPRAZolam (XANAX) 1 MG tablet Take 1 mg by mouth 3 (three) times daily as needed. For anxiety   05/04/2014 at Unknown time  . aspirin EC 81 MG tablet Take 81 mg by mouth daily.   05/05/2014 at Unknown time  . furosemide (LASIX) 40 MG tablet Take 40 mg by mouth daily.    05/05/2014 at Unknown time  . insulin aspart (NOVOLOG) 100 UNIT/ML injection Inject 5 Units into the skin 3 (three) times daily before meals. As directed per sliding scale starting at 5 units   05/05/2014 at Unknown time  . insulin glargine (LANTUS) 100 UNIT/ML injection Inject 9 Units into the skin every morning.    05/05/2014 at Unknown time  . pantoprazole (PROTONIX) 40 MG tablet Take 40 mg by mouth 2 (two) times daily as needed (acid reflux).   Past Month at Unknown time  . RESTASIS 0.05 %  ophthalmic emulsion Apply 1 drop to eye 2 (two) times daily.  3 05/05/2014 at Unknown time  . sertraline (ZOLOFT) 50 MG tablet Take 50 mg by mouth daily.  12 05/05/2014 at Unknown time  . glucagon (GLUCAGEN HYPOKIT) 1 MG SOLR injection Inject 1 mg into the skin once as needed for low blood sugar. 1 mg 10 Taking   Scheduled: . aspirin EC  81 mg Oral Daily  . cycloSPORINE  1 drop Both Eyes BID  . enoxaparin (LOVENOX) injection  40 mg Subcutaneous Q24H  . furosemide  40 mg Oral Daily  . insulin aspart  0-15 Units Subcutaneous TID WC  . insulin aspart  0-5 Units Subcutaneous QHS  . insulin aspart  5 Units Subcutaneous TID AC  . insulin glargine  9 Units Subcutaneous Daily  . sertraline  50 mg Oral Daily  . sodium chloride  3 mL Intravenous Q12H   Continuous:  [id number] **OR** acetaminophen, ALPRAZolam, alum & mag hydroxide-simeth, docusate sodium, fentaNYL, ondansetron (  ZOFRAN) IV, pantoprazole  Assesment: She was admitted with chest pain. She has ruled out for MI. She has diabetes and her blood sugar is not controlled on current treatments. She is seeing an endocrinologist and she had been having low blood sugars so adjustments were made in her insulin but her sugar has generally been high since. She has pain in her shoulder after a fall. She is extremely nauseated. Active Problems:   Chest pain    Plan: Continue treatments. Add at bedtime coverage for her insulin. Increase her anti-emetics.      Zuhair Lariccia L 05/06/2014, 11:27 AM

## 2014-05-07 DIAGNOSIS — Z87828 Personal history of other (healed) physical injury and trauma: Secondary | ICD-10-CM

## 2014-05-07 DIAGNOSIS — W19XXXA Unspecified fall, initial encounter: Secondary | ICD-10-CM | POA: Diagnosis present

## 2014-05-07 LAB — GLUCOSE, CAPILLARY: Glucose-Capillary: 319 mg/dL — ABNORMAL HIGH (ref 70–99)

## 2014-05-07 NOTE — Progress Notes (Signed)
Patient with orders to be discharged home. Discharge instructions given, patient verbalized understanding. Patient stable. Patient left in private vehicle with spouse. 

## 2014-05-07 NOTE — Discharge Summary (Signed)
Physician Discharge Summary  Patient ID: Veronica Moses MRN: [id number] DOB/AGE: Jun 01, 1955 59 y.o. Primary Care Physician:Arsen Mangione L, MD Admit date: 05/05/2014 Discharge date: 05/07/2014    Discharge Diagnoses:   Active Problems:   Diabetes   COPD (chronic obstructive pulmonary disease)   GERD (gastroesophageal reflux disease)   Chest pain   Fall   History of sprained wrist     Medication List    TAKE these medications        acetaminophen 500 MG tablet  Commonly known as:  TYLENOL  Take 1,000 mg by mouth every 6 (six) hours as needed for mild pain or moderate pain.     ALPRAZolam 1 MG tablet  Commonly known as:  XANAX  Take 1 mg by mouth 3 (three) times daily as needed. For anxiety     aspirin EC 81 MG tablet  Take 81 mg by mouth daily.     furosemide 40 MG tablet  Commonly known as:  LASIX  Take 40 mg by mouth daily.     glucagon 1 MG Solr injection  Commonly known as:  GLUCAGEN HYPOKIT  Inject 1 mg into the skin once as needed for low blood sugar.     insulin aspart 100 UNIT/ML injection  Commonly known as:  novoLOG  Inject 5 Units into the skin 3 (three) times daily before meals. As directed per sliding scale starting at 5 units     insulin glargine 100 UNIT/ML injection  Commonly known as:  LANTUS  Inject 9 Units into the skin every morning.     pantoprazole 40 MG tablet  Commonly known as:  PROTONIX  Take 40 mg by mouth 2 (two) times daily as needed (acid reflux).     RESTASIS 0.05 % ophthalmic emulsion  Generic drug:  cycloSPORINE  Apply 1 drop to eye 2 (two) times daily.     sertraline 50 MG tablet  Commonly known as:  ZOLOFT  Take 50 mg by mouth daily.        Discharged Condition: Improved    Consults: None  Significant Diagnostic Studies: Dg Shoulder Right  05/05/2014   CLINICAL DATA:  Trip and fall with right shoulder pain, initial encounter  EXAM: RIGHT SHOULDER - 2+ VIEW  COMPARISON:  None.  FINDINGS: There is no evidence of  fracture or dislocation. There is no evidence of arthropathy or other focal bone abnormality. Soft tissues are unremarkable.  IMPRESSION: No acute abnormality noted.   Electronically Signed   By: Inez Catalina M.D.   On: 05/05/2014 13:30   Dg Elbow Complete Right  05/05/2014   CLINICAL DATA:  Trip and fall with right elbow pain, initial encounter  EXAM: RIGHT ELBOW - COMPLETE 3+ VIEW  COMPARISON:  None.  FINDINGS: There is no evidence of fracture, dislocation, or joint effusion. There is no evidence of arthropathy or other focal bone abnormality. Soft tissues are unremarkable.  IMPRESSION: No acute abnormality is noted.   Electronically Signed   By: Inez Catalina M.D.   On: 05/05/2014 13:33   Dg Wrist Complete Right  05/05/2014   CLINICAL DATA:  Trip and fall with right wrist pain, initial encounter  EXAM: RIGHT WRIST - COMPLETE 3+ VIEW  COMPARISON:  None.  FINDINGS: No acute fracture dislocation is noted. Mild irregularity of the scaphoid is noted laterally which appears to be projectional in nature. No definitive fracture is seen. No gross soft tissue abnormality is noted.  IMPRESSION: No definitive fracture identified.   Electronically Signed  By: Inez Catalina M.D.   On: 05/05/2014 13:32    Lab Results: Basic Metabolic Panel:  Recent Labs  05/05/14 1421 05/06/14 0442  NA 139 131*  K 3.5 4.5  CL 100 96  CO2 32 30  GLUCOSE 95 391*  BUN 19 23  CREATININE 1.01 1.12*  CALCIUM 9.2 8.7   Liver Function Tests:  Recent Labs  05/05/14 1421  AST 29  ALT 20  ALKPHOS 33*  BILITOT 0.5  PROT 6.9  ALBUMIN 4.0     CBC:  Recent Labs  05/05/14 1421  WBC 7.3  NEUTROABS 4.1  HGB 11.6*  HCT 35.2*  MCV 88.9  PLT 218    No results found for this or any previous visit (from the past 240 hour(s)).   Hospital Course: This is a 59 year old who came to the emergency department after a fall. She was complaining of pain in her shoulder elbow and wrist on the right. She underwent x-rays which  did not show a fracture. She was being given pain medication which seemed to precipitate severe chest pain. Because of the chest pain and risk factors she was brought in for observation. She ruled out for myocardial infarction. Her pain resolved. She still had pain in her wrist.  Discharge Exam: Blood pressure 123/47, pulse 64, temperature 98.9 F (37.2 C), temperature source Oral, resp. rate 14, height 5\' 1"  (1.549 m), weight 50.5 kg (111 lb 5.3 oz), SpO2 96 %. She is awake and alert. She looks comfortable. Her arm is in a sling. Her chest is clear. Her heart is regular  Disposition: Home . She may require cardiology and orthopedic consultations as an outpatient      Discharge Instructions    Discharge patient    Complete by:  As directed              Signed: Leira Regino L   05/07/2014, 7:49 AM

## 2014-05-07 NOTE — Progress Notes (Signed)
Subjective: She said she feels much better and wants to go home. She still has some tenderness in her wrist.  Objective: Vital signs in last 24 hours: Temp:  [98.2 F (36.8 C)-98.9 F (37.2 C)] 98.9 F (37.2 C) (02/21 0629) Pulse Rate:  [64-73] 64 (02/21 0629) Resp:  [14-16] 14 (02/21 0629) BP: (123-128)/(47-51) 123/47 mmHg (02/21 0629) SpO2:  [96 %-99 %] 96 % (02/21 0629) Weight change:  Last BM Date: 05/06/14  Intake/Output from previous day: 02/20 0701 - 02/21 0700 In: 720 [P.O.:720] Out: -   PHYSICAL EXAM General appearance: alert, cooperative and no distress Resp: clear to auscultation bilaterally Cardio: regular rate and rhythm, S1, S2 normal, no murmur, click, rub or gallop GI: soft, non-tender; bowel sounds normal; no masses,  no organomegaly Extremities: extremities normal, atraumatic, no cyanosis or edema  Lab Results:  Results for orders placed or performed during the hospital encounter of 05/05/14 (from the past 48 hour(s))  CBG monitoring, ED     Status: None   Collection Time: 05/05/14  1:42 PM  Result Value Ref Range   Glucose-Capillary 72 70 - 99 mg/dL   Comment 1 Notify RN   Comprehensive metabolic panel     Status: Abnormal   Collection Time: 05/05/14  2:21 PM  Result Value Ref Range   Sodium 139 135 - 145 mmol/L   Potassium 3.5 3.5 - 5.1 mmol/L   Chloride 100 96 - 112 mmol/L   CO2 32 19 - 32 mmol/L   Glucose, Bld 95 70 - 99 mg/dL   BUN 19 6 - 23 mg/dL   Creatinine, Ser 1.01 0.50 - 1.10 mg/dL   Calcium 9.2 8.4 - 10.5 mg/dL   Total Protein 6.9 6.0 - 8.3 g/dL   Albumin 4.0 3.5 - 5.2 g/dL   AST 29 0 - 37 U/L   ALT 20 0 - 35 U/L   Alkaline Phosphatase 33 (L) 39 - 117 U/L   Total Bilirubin 0.5 0.3 - 1.2 mg/dL   GFR calc non Af Amer 60 (L) >90 mL/min   GFR calc Af Amer 69 (L) >90 mL/min    Comment: (NOTE) The eGFR has been calculated using the CKD EPI equation. This calculation has not been validated in all clinical situations. eGFR's persistently  <90 mL/min signify possible Chronic Kidney Disease.    Anion gap 7 5 - 15  Lipase, blood     Status: None   Collection Time: 05/05/14  2:21 PM  Result Value Ref Range   Lipase 21 11 - 59 U/L  Troponin I     Status: None   Collection Time: 05/05/14  2:21 PM  Result Value Ref Range   Troponin I <0.03 <0.031 ng/mL    Comment:        NO INDICATION OF MYOCARDIAL INJURY.   CBC with Differential     Status: Abnormal   Collection Time: 05/05/14  2:21 PM  Result Value Ref Range   WBC 7.3 4.0 - 10.5 K/uL   RBC 3.96 3.87 - 5.11 MIL/uL   Hemoglobin 11.6 (L) 12.0 - 15.0 g/dL   HCT 35.2 (L) 36.0 - 46.0 %   MCV 88.9 78.0 - 100.0 fL   MCH 29.3 26.0 - 34.0 pg   MCHC 33.0 30.0 - 36.0 g/dL   RDW 13.2 11.5 - 15.5 %   Platelets 218 150 - 400 K/uL   Neutrophils Relative % 57 43 - 77 %   Neutro Abs 4.1 1.7 - 7.7 K/uL  Lymphocytes Relative 33 12 - 46 %   Lymphs Abs 2.4 0.7 - 4.0 K/uL   Monocytes Relative 8 3 - 12 %   Monocytes Absolute 0.6 0.1 - 1.0 K/uL   Eosinophils Relative 2 0 - 5 %   Eosinophils Absolute 0.1 0.0 - 0.7 K/uL   Basophils Relative 0 0 - 1 %   Basophils Absolute 0.0 0.0 - 0.1 K/uL  Glucose, capillary     Status: Abnormal   Collection Time: 05/05/14  9:15 PM  Result Value Ref Range   Glucose-Capillary 254 (H) 70 - 99 mg/dL   Comment 1 Notify RN    Comment 2 Document in Chart   Troponin I     Status: None   Collection Time: 05/05/14 10:05 PM  Result Value Ref Range   Troponin I <0.03 <0.031 ng/mL    Comment:        NO INDICATION OF MYOCARDIAL INJURY.   Troponin I     Status: None   Collection Time: 05/06/14  4:42 AM  Result Value Ref Range   Troponin I <0.03 <0.031 ng/mL    Comment:        NO INDICATION OF MYOCARDIAL INJURY.   Basic metabolic panel     Status: Abnormal   Collection Time: 05/06/14  4:42 AM  Result Value Ref Range   Sodium 131 (L) 135 - 145 mmol/L    Comment: DELTA CHECK NOTED RESULT REPEATED AND VERIFIED    Potassium 4.5 3.5 - 5.1 mmol/L     Comment: DELTA CHECK NOTED RESULT REPEATED AND VERIFIED    Chloride 96 96 - 112 mmol/L   CO2 30 19 - 32 mmol/L   Glucose, Bld 391 (H) 70 - 99 mg/dL   BUN 23 6 - 23 mg/dL   Creatinine, Ser 1.12 (H) 0.50 - 1.10 mg/dL   Calcium 8.7 8.4 - 10.5 mg/dL   GFR calc non Af Amer 53 (L) >90 mL/min   GFR calc Af Amer 61 (L) >90 mL/min    Comment: (NOTE) The eGFR has been calculated using the CKD EPI equation. This calculation has not been validated in all clinical situations. eGFR's persistently <90 mL/min signify possible Chronic Kidney Disease.    Anion gap 5 5 - 15  Glucose, capillary     Status: Abnormal   Collection Time: 05/06/14  7:39 AM  Result Value Ref Range   Glucose-Capillary 371 (H) 70 - 99 mg/dL   Comment 1 Notify RN   Troponin I     Status: None   Collection Time: 05/06/14 10:14 AM  Result Value Ref Range   Troponin I <0.03 <0.031 ng/mL    Comment:        NO INDICATION OF MYOCARDIAL INJURY.   Glucose, capillary     Status: Abnormal   Collection Time: 05/06/14 11:19 AM  Result Value Ref Range   Glucose-Capillary 143 (H) 70 - 99 mg/dL   Comment 1 Notify RN   Glucose, capillary     Status: Abnormal   Collection Time: 05/06/14  3:11 PM  Result Value Ref Range   Glucose-Capillary 57 (L) 70 - 99 mg/dL   Comment 1 Notify RN   Glucose, capillary     Status: Abnormal   Collection Time: 05/06/14  3:37 PM  Result Value Ref Range   Glucose-Capillary 134 (H) 70 - 99 mg/dL  Glucose, capillary     Status: Abnormal   Collection Time: 05/06/14  4:15 PM  Result Value Ref Range  Glucose-Capillary 127 (H) 70 - 99 mg/dL   Comment 1 Notify RN   Glucose, capillary     Status: Abnormal   Collection Time: 05/06/14  9:05 PM  Result Value Ref Range   Glucose-Capillary 301 (H) 70 - 99 mg/dL   Comment 1 Notify RN    Comment 2 Document in Chart   Glucose, capillary     Status: Abnormal   Collection Time: 05/07/14  7:31 AM  Result Value Ref Range   Glucose-Capillary 319 (H) 70 - 99  mg/dL   Comment 1 Notify RN     ABGS No results for input(s): PHART, PO2ART, TCO2, HCO3 in the last 72 hours.  Invalid input(s): PCO2 CULTURES No results found for this or any previous visit (from the past 240 hour(s)). Studies/Results: Dg Shoulder Right  05/05/2014   CLINICAL DATA:  Trip and fall with right shoulder pain, initial encounter  EXAM: RIGHT SHOULDER - 2+ VIEW  COMPARISON:  None.  FINDINGS: There is no evidence of fracture or dislocation. There is no evidence of arthropathy or other focal bone abnormality. Soft tissues are unremarkable.  IMPRESSION: No acute abnormality noted.   Electronically Signed   By: Inez Catalina M.D.   On: 05/05/2014 13:30   Dg Elbow Complete Right  05/05/2014   CLINICAL DATA:  Trip and fall with right elbow pain, initial encounter  EXAM: RIGHT ELBOW - COMPLETE 3+ VIEW  COMPARISON:  None.  FINDINGS: There is no evidence of fracture, dislocation, or joint effusion. There is no evidence of arthropathy or other focal bone abnormality. Soft tissues are unremarkable.  IMPRESSION: No acute abnormality is noted.   Electronically Signed   By: Inez Catalina M.D.   On: 05/05/2014 13:33   Dg Wrist Complete Right  05/05/2014   CLINICAL DATA:  Trip and fall with right wrist pain, initial encounter  EXAM: RIGHT WRIST - COMPLETE 3+ VIEW  COMPARISON:  None.  FINDINGS: No acute fracture dislocation is noted. Mild irregularity of the scaphoid is noted laterally which appears to be projectional in nature. No definitive fracture is seen. No gross soft tissue abnormality is noted.  IMPRESSION: No definitive fracture identified.   Electronically Signed   By: Inez Catalina M.D.   On: 05/05/2014 13:32    Medications:  Prior to Admission:  Prescriptions prior to admission  Medication Sig Dispense Refill Last Dose  . acetaminophen (TYLENOL) 500 MG tablet Take 1,000 mg by mouth every 6 (six) hours as needed for mild pain or moderate pain.   05/05/2014 at morning  . ALPRAZolam (XANAX)  1 MG tablet Take 1 mg by mouth 3 (three) times daily as needed. For anxiety   05/04/2014 at Unknown time  . aspirin EC 81 MG tablet Take 81 mg by mouth daily.   05/05/2014 at Unknown time  . furosemide (LASIX) 40 MG tablet Take 40 mg by mouth daily.    05/05/2014 at Unknown time  . insulin aspart (NOVOLOG) 100 UNIT/ML injection Inject 5 Units into the skin 3 (three) times daily before meals. As directed per sliding scale starting at 5 units   05/05/2014 at Unknown time  . insulin glargine (LANTUS) 100 UNIT/ML injection Inject 9 Units into the skin every morning.    05/05/2014 at Unknown time  . pantoprazole (PROTONIX) 40 MG tablet Take 40 mg by mouth 2 (two) times daily as needed (acid reflux).   Past Month at Unknown time  . RESTASIS 0.05 % ophthalmic emulsion Apply 1 drop to eye  2 (two) times daily.  3 05/05/2014 at Unknown time  . sertraline (ZOLOFT) 50 MG tablet Take 50 mg by mouth daily.  12 05/05/2014 at Unknown time  . glucagon (GLUCAGEN HYPOKIT) 1 MG SOLR injection Inject 1 mg into the skin once as needed for low blood sugar. 1 mg 10 Taking   Scheduled: . aspirin EC  81 mg Oral Daily  . cycloSPORINE  1 drop Both Eyes BID  . enoxaparin (LOVENOX) injection  40 mg Subcutaneous Q24H  . furosemide  40 mg Oral Daily  . insulin aspart  0-15 Units Subcutaneous TID WC  . insulin aspart  0-5 Units Subcutaneous QHS  . insulin aspart  5 Units Subcutaneous TID AC  . insulin glargine  9 Units Subcutaneous Daily  . sertraline  50 mg Oral Daily  . sodium chloride  3 mL Intravenous Q12H   Continuous:  OXB:[id number] **OR** acetaminophen, ALPRAZolam, alum & mag hydroxide-simeth, docusate sodium, fentaNYL, ondansetron (ZOFRAN) IV, pantoprazole  Assesment: She was admitted with chest pain that seems to be related to receiving pain medication in the emergency department. She is much improved. She has ruled out for MI. She received the pain medication because of a fall and she still has pain in her wrist. She  has diabetes which is a little bit better controlled. I think she is ready for discharge Active Problems:   Chest pain    Plan: Discharge home she may need orthopedic and cardiology consultation as an outpatient      Demetress Tift L 05/07/2014, 7:46 AM

## 2014-05-29 ENCOUNTER — Telehealth: Payer: Self-pay

## 2014-05-29 NOTE — Telephone Encounter (Signed)
Contacted pt and advised of note below. Pt voiced understanding and follow up appointment has been scheduled for 06/02/2014.

## 2014-05-29 NOTE — Telephone Encounter (Signed)
Pt called to report blood sugar readings. Yesterday fasting blood sugar was 143. Later that afternoon pt checked her blood sugar 3 times and her blood sugar was so high her glucometer would not register. Pt took 16 units of Novolog and after that her blood sugar was 503. Pt states she had no energy and slept all day yesterday.  Fasting blood sugar this morning was 397. Pt is currently taking 16 units of Novolog 3/day with meals and Lantus 9 units in the morning.  Please advise,  Thanks!

## 2014-05-29 NOTE — Telephone Encounter (Signed)
First, please be certain pt is not taking steroids: Then please increase lantus to 20 units qd. Please continue the same novolog. Please move up next appt to later this week.

## 2014-05-31 ENCOUNTER — Emergency Department (HOSPITAL_COMMUNITY)
Admission: EM | Admit: 2014-05-31 | Discharge: 2014-05-31 | Disposition: A | Discharge status: Home / Self Care | Payer: Self-pay | Attending: Emergency Medicine | Admitting: Emergency Medicine

## 2014-05-31 ENCOUNTER — Emergency Department (HOSPITAL_COMMUNITY): Payer: Medicaid Other

## 2014-05-31 ENCOUNTER — Emergency Department (HOSPITAL_COMMUNITY): Payer: Self-pay

## 2014-05-31 ENCOUNTER — Encounter (HOSPITAL_COMMUNITY): Payer: Self-pay | Admitting: *Deleted

## 2014-05-31 DIAGNOSIS — Z794 Long term (current) use of insulin: Secondary | ICD-10-CM | POA: Insufficient documentation

## 2014-05-31 DIAGNOSIS — M545 Low back pain, unspecified: Secondary | ICD-10-CM

## 2014-05-31 DIAGNOSIS — Z7982 Long term (current) use of aspirin: Secondary | ICD-10-CM | POA: Insufficient documentation

## 2014-05-31 DIAGNOSIS — J449 Chronic obstructive pulmonary disease, unspecified: Secondary | ICD-10-CM | POA: Insufficient documentation

## 2014-05-31 DIAGNOSIS — Y9389 Activity, other specified: Secondary | ICD-10-CM | POA: Insufficient documentation

## 2014-05-31 DIAGNOSIS — S3992XA Unspecified injury of lower back, initial encounter: Secondary | ICD-10-CM | POA: Insufficient documentation

## 2014-05-31 DIAGNOSIS — S24109A Unspecified injury at unspecified level of thoracic spinal cord, initial encounter: Secondary | ICD-10-CM | POA: Insufficient documentation

## 2014-05-31 DIAGNOSIS — Z79899 Other long term (current) drug therapy: Secondary | ICD-10-CM | POA: Insufficient documentation

## 2014-05-31 DIAGNOSIS — W19XXXA Unspecified fall, initial encounter: Secondary | ICD-10-CM

## 2014-05-31 DIAGNOSIS — E119 Type 2 diabetes mellitus without complications: Secondary | ICD-10-CM | POA: Insufficient documentation

## 2014-05-31 DIAGNOSIS — S199XXA Unspecified injury of neck, initial encounter: Secondary | ICD-10-CM | POA: Insufficient documentation

## 2014-05-31 DIAGNOSIS — W1839XA Other fall on same level, initial encounter: Secondary | ICD-10-CM | POA: Insufficient documentation

## 2014-05-31 DIAGNOSIS — S7001XA Contusion of right hip, initial encounter: Secondary | ICD-10-CM | POA: Insufficient documentation

## 2014-05-31 DIAGNOSIS — Y92008 Other place in unspecified non-institutional (private) residence as the place of occurrence of the external cause: Secondary | ICD-10-CM | POA: Insufficient documentation

## 2014-05-31 DIAGNOSIS — Y998 Other external cause status: Secondary | ICD-10-CM | POA: Insufficient documentation

## 2014-05-31 DIAGNOSIS — Z87891 Personal history of nicotine dependence: Secondary | ICD-10-CM | POA: Insufficient documentation

## 2014-05-31 LAB — CBC WITH DIFFERENTIAL/PLATELET
BASOS PCT: 0 % (ref 0–1)
Basophils Absolute: 0 10*3/uL (ref 0.0–0.1)
EOS ABS: 0 10*3/uL (ref 0.0–0.7)
EOS PCT: 0 % (ref 0–5)
HCT: 38.3 % (ref 36.0–46.0)
HEMOGLOBIN: 12.7 g/dL (ref 12.0–15.0)
Lymphocytes Relative: 22 % (ref 12–46)
Lymphs Abs: 2.1 10*3/uL (ref 0.7–4.0)
MCH: 29.1 pg (ref 26.0–34.0)
MCHC: 33.2 g/dL (ref 30.0–36.0)
MCV: 87.6 fL (ref 78.0–100.0)
Monocytes Absolute: 0.7 10*3/uL (ref 0.1–1.0)
Monocytes Relative: 7 % (ref 3–12)
Neutro Abs: 7 10*3/uL (ref 1.7–7.7)
Neutrophils Relative %: 71 % (ref 43–77)
Platelets: 212 10*3/uL (ref 150–400)
RBC: 4.37 MIL/uL (ref 3.87–5.11)
RDW: 13.1 % (ref 11.5–15.5)
WBC: 9.8 10*3/uL (ref 4.0–10.5)

## 2014-05-31 LAB — BASIC METABOLIC PANEL
ANION GAP: 14 (ref 5–15)
BUN: 20 mg/dL (ref 6–23)
CO2: 28 mmol/L (ref 19–32)
Calcium: 9.9 mg/dL (ref 8.4–10.5)
Chloride: 92 mmol/L — ABNORMAL LOW (ref 96–112)
Creatinine, Ser: 1.17 mg/dL — ABNORMAL HIGH (ref 0.50–1.10)
GFR calc non Af Amer: 50 mL/min — ABNORMAL LOW (ref >90)
GFR, EST AFRICAN AMERICAN: 58 mL/min — AB (ref >90)
Glucose, Bld: 214 mg/dL — ABNORMAL HIGH (ref 70–99)
POTASSIUM: 3.5 mmol/L (ref 3.5–5.1)
Sodium: 134 mmol/L — ABNORMAL LOW (ref 135–145)

## 2014-05-31 LAB — PROTIME-INR
INR: 0.97 (ref 0.00–1.49)
Prothrombin Time: 13 seconds (ref 11.6–15.2)

## 2014-05-31 MED ORDER — CYCLOBENZAPRINE HCL 10 MG PO TABS: 10.0000 mg | ORAL_TABLET | Freq: Three times a day (TID) | ORAL | Status: DC | PRN

## 2014-05-31 MED ORDER — OXYCODONE-ACETAMINOPHEN 5-325 MG PO TABS: 2.0000 | ORAL_TABLET | ORAL | Status: DC | PRN

## 2014-05-31 MED ORDER — MORPHINE SULFATE 4 MG/ML IJ SOLN
4.0000 mg | Freq: Once | INTRAMUSCULAR | Status: AC
  Administered 2014-05-31: 4 mg via INTRAVENOUS
  Filled 2014-05-31: qty 1

## 2014-05-31 MED ORDER — FENTANYL CITRATE 0.05 MG/ML IJ SOLN
50.0000 ug | Freq: Once | INTRAMUSCULAR | Status: AC
  Administered 2014-05-31: 50 ug via INTRAVENOUS
  Filled 2014-05-31: qty 2

## 2014-05-31 MED ORDER — ONDANSETRON HCL 4 MG/2ML IJ SOLN
4.0000 mg | Freq: Once | INTRAMUSCULAR | Status: AC
Start: 2014-05-31 — End: 2014-05-31
  Administered 2014-05-31: 4 mg via INTRAVENOUS
  Filled 2014-05-31: qty 2

## 2014-05-31 NOTE — ED Notes (Signed)
Pt requesting more pain meds at this time.

## 2014-05-31 NOTE — ED Notes (Signed)
Pt st's every time she takes pain medication it causes the same pain.

## 2014-05-31 NOTE — ED Provider Notes (Signed)
CSN: [id number]     Arrival date & time 05/31/14  1321 History   First MD Initiated Contact with Patient 05/31/14 1340     Chief Complaint  Patient presents with  . Fall  . Hip Pain     (Consider location/radiation/quality/duration/timing/severity/associated sxs/prior Treatment) HPI Comments: Patient presents to the ER for evaluation of right hip pain after a fall. Patient reports that she fell this morning going to the bathroom. Patient complains of constant and severe pain in the right hip that severely worsens if she moves it. She did hit her head, no loss of consciousness. Complains of neck and back pain as well. No chest pain, shortness of breath. No abdominal pain. No syncope.  Patient is a 59 y.o. female presenting with fall and hip pain.  Fall Pertinent negatives include no headaches.  Hip Pain Pertinent negatives include no headaches.    Past Medical History  Diagnosis Date  . COPD (chronic obstructive pulmonary disease)   . Diabetes mellitus    Past Surgical History  Procedure Laterality Date  . Abdominal hysterectomy    . Nephrectomy    . Cholecystectomy    . Breast surgery    . Colonoscopy with esophagogastroduodenoscopy (egd) N/A 03/04/2013    Procedure: COLONOSCOPY WITH ESOPHAGOGASTRODUODENOSCOPY (EGD);  Surgeon: Rogene Houston, MD;  Location: [facility];  Service: Endoscopy;  Laterality: N/A;  1200-moved to [place] notified pt   Family History  Problem Relation Age of Onset  . Colon cancer Neg Hx   . CAD      family history  . Diabetes Father   . Diabetes Brother    History  Substance Use Topics  . Smoking status: Former Smoker -- 1.00 packs/day for 26 years    Types: Cigarettes  . Smokeless tobacco: Former Systems developer    Quit date: 05/15/2012  . Alcohol Use: No   OB History    No data available     Review of Systems  Musculoskeletal: Positive for back pain, arthralgias and neck pain.  Neurological: Negative for syncope and headaches.  All other  systems reviewed and are negative.     Allergies  Demerol and Dilaudid  Home Medications   Prior to Admission medications   Medication Sig Start Date End Date Taking? Authorizing Provider  acetaminophen (TYLENOL) 500 MG tablet Take 1,000 mg by mouth every 6 (six) hours as needed for mild pain or moderate pain.    Historical Provider, MD  ALPRAZolam Duanne Moron) 1 MG tablet Take 1 mg by mouth 3 (three) times daily as needed. For anxiety    Historical Provider, MD  aspirin EC 81 MG tablet Take 81 mg by mouth daily.    Historical Provider, MD  furosemide (LASIX) 40 MG tablet Take 40 mg by mouth daily.     Historical Provider, MD  glucagon (GLUCAGEN HYPOKIT) 1 MG SOLR injection Inject 1 mg into the skin once as needed for low blood sugar. 12/21/13   Renato Shin, MD  insulin aspart (NOVOLOG) 100 UNIT/ML injection Inject 5 Units into the skin 3 (three) times daily before meals. As directed per sliding scale starting at 5 units    Historical Provider, MD  insulin glargine (LANTUS) 100 UNIT/ML injection Inject 9 Units into the skin every morning.     Historical Provider, MD  pantoprazole (PROTONIX) 40 MG tablet Take 40 mg by mouth 2 (two) times daily as needed (acid reflux). 03/04/13   Rogene Houston, MD  RESTASIS 0.05 % ophthalmic emulsion Apply  1 drop to eye 2 (two) times daily. 04/29/14   Historical Provider, MD  sertraline (ZOLOFT) 50 MG tablet Take 50 mg by mouth daily. 03/28/14   Historical Provider, MD   BP 125/77 mmHg  Pulse 110  Temp(Src) 98.7 F (37.1 C) (Oral)  Resp 20  Ht 5\' 1"  (1.549 m)  Wt 106 lb (48.081 kg)  BMI 20.04 kg/m2  SpO2 97% Physical Exam  Constitutional: She is oriented to person, place, and time. She appears well-developed and well-nourished. No distress.  HENT:  Head: Normocephalic and atraumatic.  Right Ear: Hearing normal.  Left Ear: Hearing normal.  Nose: Nose normal.  Mouth/Throat: Oropharynx is clear and moist and mucous membranes are normal.  Eyes:  Conjunctivae and EOM are normal. Pupils are equal, round, and reactive to light.  Neck: Normal range of motion. Neck supple.  Cardiovascular: Regular rhythm, S1 normal and S2 normal.  Exam reveals no gallop and no friction rub.   No murmur heard. Pulmonary/Chest: Effort normal and breath sounds normal. No respiratory distress. She exhibits no tenderness.  Abdominal: Soft. Normal appearance and bowel sounds are normal. There is no hepatosplenomegaly. There is no tenderness. There is no rebound, no guarding, no tenderness at McBurney's point and negative Murphy's sign. No hernia.  Musculoskeletal:       Right hip: She exhibits decreased range of motion and tenderness.       Cervical back: She exhibits tenderness.       Thoracic back: She exhibits tenderness.       Lumbar back: She exhibits tenderness.       Back:  Neurological: She is alert and oriented to person, place, and time. She has normal strength. No cranial nerve deficit or sensory deficit. Coordination normal. GCS eye subscore is 4. GCS verbal subscore is 5. GCS motor subscore is 6.  Skin: Skin is warm, dry and intact. No rash noted. No cyanosis.  Psychiatric: She has a normal mood and affect. Her speech is normal and behavior is normal. Thought content normal.  Nursing note and vitals reviewed.   ED Course  Procedures (including critical care time) Labs Review Labs Reviewed  BASIC METABOLIC PANEL - Abnormal; Notable for the following:    Sodium 134 (*)    Chloride 92 (*)    Glucose, Bld 214 (*)    Creatinine, Ser 1.17 (*)    GFR calc non Af Amer 50 (*)    GFR calc Af Amer 58 (*)    All other components within normal limits  CBC WITH DIFFERENTIAL/PLATELET  PROTIME-INR    Imaging Review Dg Chest 1 View  05/31/2014   CLINICAL DATA:  Pain following fall  EXAM: CHEST  1 VIEW  COMPARISON:  October 24, 2013  FINDINGS: There is no edema or consolidation. Heart size and pulmonary vascularity are normal. No adenopathy. There is  remodeling in the distal left clavicle, a stable finding that may represent residua of old trauma. No acute fracture apparent. No pneumothorax.  IMPRESSION: No edema or consolidation.   Electronically Signed   By: Lowella Grip III M.D.   On: 05/31/2014 14:52   Dg Thoracic Spine 2 View  05/31/2014   CLINICAL DATA:  Pain following fall earlier today  EXAM: THORACIC SPINE - 2 VIEW  COMPARISON:  Chest radiograph April 22, 2012.  FINDINGS: Frontal and lateral views were obtained. There is slight upper thoracic dextroscoliosis. No fracture or spondylolisthesis. There is mild disc narrowing at several levels in the upper and mid thoracic  region. No erosive change.  IMPRESSION: Areas of relatively mild osteoarthritic change. Slight upper thoracic scoliosis. No fracture or spondylolisthesis.   Electronically Signed   By: Lowella Grip III M.D.   On: 05/31/2014 14:53   Dg Lumbar Spine Complete  05/31/2014   CLINICAL DATA:  [place] out of bed today. Unable to bear weight. Low back pain.  EXAM: LUMBAR SPINE - COMPLETE 4+ VIEW  COMPARISON:  CT of the abdomen and pelvis 01/04/2013  FINDINGS: Five non rib-bearing lumbar type vertebral bodies are present. Vertebral body heights and alignment are normal. Mild degenerative changes are noted in the lower lumbar facets. No acute fracture or traumatic subluxation is evident.  IMPRESSION: 1. 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 2. No acute abnormality.   Electronically Signed   By: San Morelle M.D.   On: 05/31/2014 14:57   Ct Head Wo Contrast  05/31/2014   CLINICAL DATA:  Pain following fall  EXAM: CT HEAD WITHOUT CONTRAST  CT CERVICAL SPINE WITHOUT CONTRAST  TECHNIQUE: Multidetector CT imaging of the head and cervical spine was performed following the standard protocol without intravenous contrast. Multiplanar CT image reconstructions of the cervical spine were also generated.  COMPARISON:  Head CT October 24, 2013  FINDINGS: CT HEAD FINDINGS   The ventricles are normal in size and configuration. There is no mass, hemorrhage, extra-axial fluid collection, or midline shift. The gray-white compartments appear normal except for minimal periventricular small vessel disease, stable. No new gray-white compartment lesion. No acute infarct. Bony calvarium appears intact. The mastoid air cells are clear.  CT CERVICAL SPINE FINDINGS  There is no fracture or spondylolisthesis. Prevertebral soft tissues and predental space regions are normal. There is slight disc space narrowing at C5-6 and C6-7. There is a small anterior osteophyte at C5 inferiorly. There is no nerve root edema or effacement. No disc extrusion or stenosis. There is focal calcification in the left carotid artery.  IMPRESSION: CT head: Minimal periventricular small vessel disease, stable. Study otherwise unremarkable. No intracranial mass, hemorrhage, or extra-axial fluid collection.  CT cervical spine: Slight osteoarthritic change. No fracture or spondylolisthesis. Focal calcification in the left carotid artery.   Electronically Signed   By: Lowella Grip III M.D.   On: 05/31/2014 15:19   Ct Cervical Spine Wo Contrast  05/31/2014   CLINICAL DATA:  Pain following fall  EXAM: CT HEAD WITHOUT CONTRAST  CT CERVICAL SPINE WITHOUT CONTRAST  TECHNIQUE: Multidetector CT imaging of the head and cervical spine was performed following the standard protocol without intravenous contrast. Multiplanar CT image reconstructions of the cervical spine were also generated.  COMPARISON:  Head CT October 24, 2013  FINDINGS: CT HEAD FINDINGS  The ventricles are normal in size and configuration. There is no mass, hemorrhage, extra-axial fluid collection, or midline shift. The gray-white compartments appear normal except for minimal periventricular small vessel disease, stable. No new gray-white compartment lesion. No acute infarct. Bony calvarium appears intact. The mastoid air cells are clear.  CT CERVICAL SPINE  FINDINGS  There is no fracture or spondylolisthesis. Prevertebral soft tissues and predental space regions are normal. There is slight disc space narrowing at C5-6 and C6-7. There is a small anterior osteophyte at C5 inferiorly. There is no nerve root edema or effacement. No disc extrusion or stenosis. There is focal calcification in the left carotid artery.  IMPRESSION: CT head: Minimal periventricular small vessel disease, stable. Study otherwise unremarkable. No intracranial mass, hemorrhage, or extra-axial fluid collection.  CT cervical  spine: Slight osteoarthritic change. No fracture or spondylolisthesis. Focal calcification in the left carotid artery.   Electronically Signed   By: Lowella Grip III M.D.   On: 05/31/2014 15:19   Dg Hip Unilat With Pelvis 2-3 Views Right  05/31/2014   CLINICAL DATA:  59 year old female with a history of severe right hip pain.  EXAM: RIGHT HIP (WITH PELVIS) 2-3 VIEWS  COMPARISON:  Abdomen/pelvis CT 01/04/2013  FINDINGS: Bony pelvic ring appears intact without acute bony abnormality. Bilateral hips projects normally over the acetabula.  Visualized aspects of proximal left femur unremarkable.  Pelvic vascular calcifications.  Osteopenia.  Surgical clips in the left abdomen, present on comparison CT.  Right hip appears congruent. No subcutaneous gas or radiopaque foreign body. No sclerotic or lucent lines of the proximal right femur.  IMPRESSION: Negative for acute bony abnormality. Diffuse osteopenia does limit sensitivity for nondisplaced or chronic/insufficiency fractures, and if there is ongoing concern, MRI may be considered.  Signed,  Dulcy Fanny. Earleen Newport, DO  [facility]  [facility]   Electronically Signed   By: Corrie Mckusick D.O.   On: 05/31/2014 14:52     EKG Interpretation None      MDM   Final diagnoses:  Contusion, hip, right, initial encounter  Right-sided low back pain without sciatica    She presents to  the ER for evaluation of back pain. Patient reports a fall at her home today. She came in complaining of hip pain. She has been ambulatory since the fall. Patient reports that there is severe pain in the hip, however, that starts in the lower back and radiates down the back of her buttock area. She did not have any obvious deformity of the hip examination. Patient's x-ray of hip, lumbar spine, thoracic spine, chest are negative. CT head and cervical spine also performed, no acute abnormality noted. Patient administered analgesia here in the ER will be continued analgesia and rest at home.   Orpah Greek, MD 05/31/14 [phone]

## 2014-05-31 NOTE — Discharge Instructions (Signed)
Back Pain, Adult Low back pain is very common. About 1 in 5 people have back pain.The cause of low back pain is rarely dangerous. The pain often gets better over time.About half of people with a sudden onset of back pain feel better in just 2 weeks. About 8 in 10 people feel better by 6 weeks.  CAUSES Some common causes of back pain include:  Strain of the muscles or ligaments supporting the spine.  Wear and tear (degeneration) of the spinal discs.  Arthritis.  Direct injury to the back. DIAGNOSIS Most of the time, the direct cause of low back pain is not known.However, back pain can be treated effectively even when the exact cause of the pain is unknown.Answering your caregiver's questions about your overall health and symptoms is one of the most accurate ways to make sure the cause of your pain is not dangerous. If your caregiver needs more information, he or she may order lab work or imaging tests (X-rays or MRIs).However, even if imaging tests show changes in your back, this usually does not require surgery. HOME CARE INSTRUCTIONS For many people, back pain returns.Since low back pain is rarely dangerous, it is often a condition that people can learn to manageon their own.   Remain active. It is stressful on the back to sit or stand in one place. Do not sit, drive, or stand in one place for more than 30 minutes at a time. Take short walks on level surfaces as soon as pain allows.Try to increase the length of time you walk each day.  Do not stay in bed.Resting more than 1 or 2 days can delay your recovery.  Do not avoid exercise or work.Your body is made to move.It is not dangerous to be active, even though your back may hurt.Your back will likely heal faster if you return to being active before your pain is gone.  Pay attention to your body when you bend and lift. Many people have less discomfortwhen lifting if they bend their knees, keep the load close to their bodies,and  avoid twisting. Often, the most comfortable positions are those that put less stress on your recovering back.  Find a comfortable position to sleep. Use a firm mattress and lie on your side with your knees slightly bent. If you lie on your back, put a pillow under your knees.  Only take over-the-counter or prescription medicines as directed by your caregiver. Over-the-counter medicines to reduce pain and inflammation are often the most helpful.Your caregiver may prescribe muscle relaxant drugs.These medicines help dull your pain so you can more quickly return to your normal activities and healthy exercise.  Put ice on the injured area.  Put ice in a plastic bag.  Place a towel between your skin and the bag.  Leave the ice on for 15-20 minutes, 03-04 times a day for the first 2 to 3 days. After that, ice and heat may be alternated to reduce pain and spasms.  Ask your caregiver about trying back exercises and gentle massage. This may be of some benefit.  Avoid feeling anxious or stressed.Stress increases muscle tension and can worsen back pain.It is important to recognize when you are anxious or stressed and learn ways to manage it.Exercise is a great option. SEEK MEDICAL CARE IF:  You have pain that is not relieved with rest or medicine.  You have pain that does not improve in 1 week.  You have new symptoms.  You are generally not feeling well. SEEK   IMMEDIATE MEDICAL CARE IF:   You have pain that radiates from your back into your legs.  You develop new bowel or bladder control problems.  You have unusual weakness or numbness in your arms or legs.  You develop nausea or vomiting.  You develop abdominal pain.  You feel faint. Document Released: 03/03/2005 Document Revised: 09/02/2011 Document Reviewed: 07/05/2013 [employer] Patient Information 2015 [employer]. This information is not intended to replace advice given to you by your health care provider. Make sure you  discuss any questions you have with your health care provider.  

## 2014-05-31 NOTE — ED Notes (Signed)
Pt c/o epigastric and mid lower chest pain after receiving Morphine.  Dr, Betsey Holiday made aware.  Pt's sister st's "I will take you somewhere else because they aren't gonna help you here"  Pt's sister also st's "That doctor ain't gonna do nothing for you".    Pt placed on cardiac monitor NSR rate 72, EKG obtained and given to Dr. Eulis Foster.

## 2014-05-31 NOTE — ED Notes (Signed)
Pt reports falling this am, having severe right hip pain, unable to bear weight on right leg. Pt crying at triage.

## 2014-05-31 NOTE — ED Provider Notes (Signed)
   Date: 05/31/14  Rate: 72  Rhythm: sinus rhythym  QRS Axis: normal  PR and QT Intervals: normal  ST/T Wave abnormalities: nonspecific ST changes  PR and QRS Conduction Disutrbances:none  Narrative Interpretation:   Old EKG Reviewed: unchanged   Daleen Bo, MD 05/31/14 [phone]

## 2014-06-02 ENCOUNTER — Ambulatory Visit: Payer: Medicaid Other | Admitting: Endocrinology

## 2014-06-05 ENCOUNTER — Ambulatory Visit: Payer: Medicaid Other | Admitting: Endocrinology

## 2014-07-13 ENCOUNTER — Emergency Department (HOSPITAL_COMMUNITY)
Admission: EM | Admit: 2014-07-13 | Discharge: 2014-07-13 | Disposition: A | Discharge status: Home / Self Care | Payer: Self-pay | Attending: Emergency Medicine | Admitting: Emergency Medicine

## 2014-07-13 ENCOUNTER — Telehealth: Payer: Self-pay | Admitting: Endocrinology

## 2014-07-13 ENCOUNTER — Encounter (HOSPITAL_COMMUNITY): Payer: Self-pay | Admitting: Emergency Medicine

## 2014-07-13 ENCOUNTER — Emergency Department (HOSPITAL_COMMUNITY): Payer: Medicaid Other

## 2014-07-13 DIAGNOSIS — Z7982 Long term (current) use of aspirin: Secondary | ICD-10-CM | POA: Insufficient documentation

## 2014-07-13 DIAGNOSIS — Z79899 Other long term (current) drug therapy: Secondary | ICD-10-CM | POA: Insufficient documentation

## 2014-07-13 DIAGNOSIS — Z794 Long term (current) use of insulin: Secondary | ICD-10-CM | POA: Insufficient documentation

## 2014-07-13 DIAGNOSIS — Z87891 Personal history of nicotine dependence: Secondary | ICD-10-CM | POA: Insufficient documentation

## 2014-07-13 DIAGNOSIS — R739 Hyperglycemia, unspecified: Secondary | ICD-10-CM

## 2014-07-13 DIAGNOSIS — J449 Chronic obstructive pulmonary disease, unspecified: Secondary | ICD-10-CM | POA: Insufficient documentation

## 2014-07-13 DIAGNOSIS — E1165 Type 2 diabetes mellitus with hyperglycemia: Secondary | ICD-10-CM | POA: Insufficient documentation

## 2014-07-13 LAB — I-STAT VENOUS BLOOD GAS, ED
Acid-Base Excess: 2 mmol/L (ref 0.0–2.0)
Bicarbonate: 26 mEq/L — ABNORMAL HIGH (ref 20.0–24.0)
O2 Saturation: 86 %
TCO2: 27 mmol/L (ref 0–100)
pCO2, Ven: 38.4 mmHg — ABNORMAL LOW (ref 45.0–50.0)
pH, Ven: 7.438 — ABNORMAL HIGH (ref 7.250–7.300)
pO2, Ven: 50 mmHg — ABNORMAL HIGH (ref 30.0–45.0)

## 2014-07-13 LAB — COMPREHENSIVE METABOLIC PANEL
ALT: 24 U/L (ref 0–35)
AST: 30 U/L (ref 0–37)
Albumin: 3.5 g/dL (ref 3.5–5.2)
Alkaline Phosphatase: 41 U/L (ref 39–117)
Anion gap: 11 (ref 5–15)
BUN: 20 mg/dL (ref 6–23)
CALCIUM: 9.1 mg/dL (ref 8.4–10.5)
CO2: 26 mmol/L (ref 19–32)
CREATININE: 1.33 mg/dL — AB (ref 0.50–1.10)
Chloride: 89 mmol/L — ABNORMAL LOW (ref 96–112)
GFR calc Af Amer: 50 mL/min — ABNORMAL LOW (ref >90)
GFR, EST NON AFRICAN AMERICAN: 43 mL/min — AB (ref >90)
Glucose, Bld: 720 mg/dL (ref 70–99)
Potassium: 4.5 mmol/L (ref 3.5–5.1)
SODIUM: 126 mmol/L — AB (ref 135–145)
Total Bilirubin: 0.6 mg/dL (ref 0.3–1.2)
Total Protein: 6.5 g/dL (ref 6.0–8.3)

## 2014-07-13 LAB — CBC WITH DIFFERENTIAL/PLATELET
BASOS PCT: 0 % (ref 0–1)
Basophils Absolute: 0 10*3/uL (ref 0.0–0.1)
EOS PCT: 3 % (ref 0–5)
Eosinophils Absolute: 0.3 10*3/uL (ref 0.0–0.7)
HCT: 37.3 % (ref 36.0–46.0)
Hemoglobin: 12 g/dL (ref 12.0–15.0)
Lymphocytes Relative: 22 % (ref 12–46)
Lymphs Abs: 2 10*3/uL (ref 0.7–4.0)
MCH: 28.7 pg (ref 26.0–34.0)
MCHC: 32.2 g/dL (ref 30.0–36.0)
MCV: 89.2 fL (ref 78.0–100.0)
Monocytes Absolute: 0.6 10*3/uL (ref 0.1–1.0)
Monocytes Relative: 6 % (ref 3–12)
Neutro Abs: 6.5 10*3/uL (ref 1.7–7.7)
Neutrophils Relative %: 69 % (ref 43–77)
Platelets: 257 10*3/uL (ref 150–400)
RBC: 4.18 MIL/uL (ref 3.87–5.11)
RDW: 13 % (ref 11.5–15.5)
WBC: 9.3 10*3/uL (ref 4.0–10.5)

## 2014-07-13 LAB — URINALYSIS, ROUTINE W REFLEX MICROSCOPIC
Bilirubin Urine: NEGATIVE
Glucose, UA: >1000 mg/dL — AB
HGB URINE DIPSTICK: NEGATIVE
KETONES UR: NEGATIVE mg/dL
NITRITE: NEGATIVE
PROTEIN: NEGATIVE mg/dL
Specific Gravity, Urine: 1.021 (ref 1.005–1.030)
Urobilinogen, UA: 0.2 mg/dL (ref 0.0–1.0)
pH: 6.5 (ref 5.0–8.0)

## 2014-07-13 LAB — I-STAT CG4 LACTIC ACID, ED: Lactic Acid, Venous: 1.7 mmol/L (ref 0.5–2.0)

## 2014-07-13 LAB — CBG MONITORING, ED
GLUCOSE-CAPILLARY: 284 mg/dL — AB (ref 70–99)
Glucose-Capillary: >600 mg/dL (ref 70–99)
Glucose-Capillary: 408 mg/dL — ABNORMAL HIGH (ref 70–99)

## 2014-07-13 LAB — URINE MICROSCOPIC-ADD ON

## 2014-07-13 MED ORDER — SODIUM CHLORIDE 0.9 % IV BOLUS (SEPSIS)
1000.0000 mL | Freq: Once | INTRAVENOUS | Status: AC
  Administered 2014-07-13: 1000 mL via INTRAVENOUS

## 2014-07-13 MED ORDER — INSULIN ASPART 100 UNIT/ML ~~LOC~~ SOLN
6.0000 [IU] | Freq: Once | SUBCUTANEOUS | Status: AC
Start: 2014-07-13 — End: 2014-07-13
  Administered 2014-07-13: 6 [IU] via INTRAVENOUS
  Filled 2014-07-13: qty 1

## 2014-07-13 MED ORDER — SODIUM CHLORIDE 0.9 % IV SOLN: 1000.0000 mL | INTRAVENOUS | Status: DC

## 2014-07-13 MED ORDER — INSULIN ASPART 100 UNIT/ML ~~LOC~~ SOLN
4.0000 [IU] | Freq: Once | SUBCUTANEOUS | Status: AC
Start: 2014-07-13 — End: 2014-07-13
  Administered 2014-07-13: 4 [IU] via INTRAVENOUS
  Filled 2014-07-13: qty 1

## 2014-07-13 MED ORDER — DEXTROSE-NACL 5-0.45 % IV SOLN: INTRAVENOUS | Status: DC

## 2014-07-13 MED ORDER — SODIUM CHLORIDE 0.9 % IV SOLN
INTRAVENOUS | Status: DC
  Filled 2014-07-13: qty 2.5

## 2014-07-13 NOTE — Discharge Instructions (Signed)
Return here as needed. Follow up with your diabetic doctor as well. Your insulin regimen may need adjustment.

## 2014-07-13 NOTE — ED Notes (Signed)
Pt c/o hyperglycemia; pt sts taking her insulin; pt c/o nausea

## 2014-07-13 NOTE — Telephone Encounter (Signed)
Pt is on her way to [facility] emergency room with blood sugar level 592

## 2014-07-13 NOTE — ED Provider Notes (Signed)
The patient's blood sugar has improved significantly, now down below 300, ambulatory with minimal difficulty, tolerating oral fluids, stable for discharge, she will follow-up with Dr. Luan Pulling this week.  Noemi Chapel, MD 07/13/14 [phone]

## 2014-07-13 NOTE — ED Provider Notes (Signed)
CSN: [id number]     Arrival date & time 07/13/14  1258 History   First MD Initiated Contact with Patient 07/13/14 1405     Chief Complaint  Patient presents with  . Hyperglycemia     (Consider location/radiation/quality/duration/timing/severity/associated sxs/prior Treatment) HPI Patient presents to the emergency department with hyperglycemia.  The patient states that her insulin regimen is change recently and states that her sugars have been running high.  The patient states that today her blood sugars in the 500s before she decided to come to the emergency department.  The patient denies chest pain, shortness of breath, nausea, vomiting, weakness,headache, blurred vision, back pain, neck pain, cough, runny nose, sore throat, fever or syncope.  The patient states that nothing seems make her condition better or worse.  The patient has had some dizziness associated with her elevated blood sugar. Past Medical History  Diagnosis Date  . COPD (chronic obstructive pulmonary disease)   . Diabetes mellitus    Past Surgical History  Procedure Laterality Date  . Abdominal hysterectomy    . Nephrectomy    . Cholecystectomy    . Breast surgery    . Colonoscopy with esophagogastroduodenoscopy (egd) N/A 03/04/2013    Procedure: COLONOSCOPY WITH ESOPHAGOGASTRODUODENOSCOPY (EGD);  Surgeon: Rogene Houston, MD;  [facility]: [facility];  Service: Endoscopy;  Laterality: N/A;  1200-moved to [place] notified pt   Family History  Problem Relation Age of Onset  . Colon cancer Neg Hx   . CAD      family history  . Diabetes Father   . Diabetes Brother    History  Substance Use Topics  . Smoking status: Former Smoker -- 1.00 packs/day for 26 years    Types: Cigarettes  . Smokeless tobacco: Former Systems developer    Quit date: 05/15/2012  . Alcohol Use: No   OB History    No data available     Review of Systems  All other systems negative except as documented in the HPI. All pertinent positives and  negatives as reviewed in the HPI.  Allergies  Demerol and Dilaudid  Home Medications   Prior to Admission medications   Medication Sig Start Date End Date Taking? Authorizing Provider  acetaminophen (TYLENOL) 500 MG tablet Take 1,000 mg by mouth every 6 (six) hours as needed for mild pain or moderate pain.   Yes Historical Provider, MD  ALPRAZolam Duanne Moron) 1 MG tablet Take 1 mg by mouth 3 (three) times daily as needed. For anxiety   Yes Historical Provider, MD  aspirin EC 81 MG tablet Take 81 mg by mouth daily.   Yes Historical Provider, MD  furosemide (LASIX) 40 MG tablet Take 40 mg by mouth daily.    Yes Historical Provider, MD  insulin glargine (LANTUS) 100 UNIT/ML injection Inject 20 Units into the skin every morning.    Yes Historical Provider, MD  NOVOLOG FLEXPEN 100 UNIT/ML FlexPen Inject 5-6 Units into the skin 3 (three) times daily before meals. Inject starting at 5 units per sliding scale 04/27/14  Yes Historical Provider, MD  pantoprazole (PROTONIX) 40 MG tablet Take 40 mg by mouth 2 (two) times daily as needed (acid reflux). 03/04/13  Yes Rogene Houston, MD  PRESCRIPTION MEDICATION Apply 1 application topically at bedtime. Apply cream to feet nightly   Yes Historical Provider, MD  RESTASIS 0.05 % ophthalmic emulsion Apply 1 drop to eye 2 (two) times daily. 04/29/14  Yes Historical Provider, MD  sertraline (ZOLOFT) 50 MG tablet Take 50  mg by mouth every morning.  03/28/14  Yes Historical Provider, MD  cyclobenzaprine (FLEXERIL) 10 MG tablet Take 1 tablet (10 mg total) by mouth 3 (three) times daily as needed for muscle spasms. 05/31/14   Orpah Greek, MD  glucagon (GLUCAGEN HYPOKIT) 1 MG SOLR injection Inject 1 mg into the skin once as needed for low blood sugar. 12/21/13   Renato Shin, MD  oxyCODONE-acetaminophen (PERCOCET) 5-325 MG per tablet Take 2 tablets by mouth every 4 (four) hours as needed. 05/31/14   Orpah Greek, MD   BP 132/52 mmHg  Pulse 75  Temp(Src) 98  F (36.7 C) (Oral)  Resp 18  Ht 5\' 1"  (1.549 m)  Wt 107 lb (48.535 kg)  BMI 20.23 kg/m2  SpO2 100% Physical Exam  Constitutional: She is oriented to person, place, and time. She appears well-developed and well-nourished. No distress.  HENT:  Head: Normocephalic and atraumatic.  Mouth/Throat: Oropharynx is clear and moist.  Eyes: Pupils are equal, round, and reactive to light.  Neck: Normal range of motion. Neck supple.  Cardiovascular: Normal rate, regular rhythm and normal heart sounds.  Exam reveals no gallop and no friction rub.   No murmur heard. Pulmonary/Chest: Effort normal and breath sounds normal. No respiratory distress.  Abdominal: Soft. Bowel sounds are normal. She exhibits no distension. There is no tenderness.  Musculoskeletal: She exhibits no edema.  Neurological: She is alert and oriented to person, place, and time. She exhibits normal muscle tone. Coordination normal.  Skin: Skin is warm and dry. No rash noted. No erythema.  Psychiatric: She has a normal mood and affect. Her behavior is normal.  Nursing note and vitals reviewed.   ED Course  Procedures (including critical care time) Labs Review Labs Reviewed  COMPREHENSIVE METABOLIC PANEL - Abnormal; Notable for the following:    Sodium 126 (*)    Chloride 89 (*)    Glucose, Bld 720 (*)    Creatinine, Ser 1.33 (*)    GFR calc non Af Amer 43 (*)    GFR calc Af Amer 50 (*)    All other components within normal limits  URINALYSIS, ROUTINE W REFLEX MICROSCOPIC - Abnormal; Notable for the following:    Glucose, UA >1000 (*)    Leukocytes, UA SMALL (*)    All other components within normal limits  CBG MONITORING, ED - Abnormal; Notable for the following:    Glucose-Capillary >600 (*)    All other components within normal limits  I-STAT VENOUS BLOOD GAS, ED - Abnormal; Notable for the following:    pH, Ven 7.438 (*)    pCO2, Ven 38.4 (*)    pO2, Ven 50.0 (*)    Bicarbonate 26.0 (*)    All other components  within normal limits  CBC WITH DIFFERENTIAL/PLATELET  URINE MICROSCOPIC-ADD ON  I-STAT CG4 LACTIC ACID, ED    Imaging Review Dg Chest Port 1 View  07/13/2014   CLINICAL DATA:  COPD, diabetes  EXAM: PORTABLE CHEST - 1 VIEW  COMPARISON:  05/31/2014  FINDINGS: The heart size and mediastinal contours are within normal limits. Both lungs are clear. The visualized skeletal structures are unremarkable.  IMPRESSION: No active disease.   Electronically Signed   By: Kathreen Devoid   On: 07/13/2014 16:40     EKG Interpretation None      The patient is stable.  Otherwise, she does not have any signs of DKA at this time.  The patient is given IV fluids and given IV  insulin patient's blood sugars have responded nicely healed.  She will be observed further by Dr. Dyann Ruddle, PA-C 07/14/14 1701  Quintella Reichert, MD 07/14/14 [phone]

## 2014-07-13 NOTE — Telephone Encounter (Signed)
See note below to be advised. 

## 2015-05-21 ENCOUNTER — Emergency Department (HOSPITAL_COMMUNITY)
Admission: EM | Admit: 2015-05-21 | Discharge: 2015-05-21 | Disposition: A | Discharge status: Home / Self Care | Payer: Self-pay | Attending: Emergency Medicine | Admitting: Emergency Medicine

## 2015-05-21 ENCOUNTER — Encounter (HOSPITAL_COMMUNITY): Payer: Self-pay | Admitting: *Deleted

## 2015-05-21 ENCOUNTER — Emergency Department (HOSPITAL_COMMUNITY): Payer: Self-pay

## 2015-05-21 DIAGNOSIS — Z7901 Long term (current) use of anticoagulants: Secondary | ICD-10-CM | POA: Insufficient documentation

## 2015-05-21 DIAGNOSIS — E11649 Type 2 diabetes mellitus with hypoglycemia without coma: Secondary | ICD-10-CM | POA: Insufficient documentation

## 2015-05-21 DIAGNOSIS — J449 Chronic obstructive pulmonary disease, unspecified: Secondary | ICD-10-CM | POA: Insufficient documentation

## 2015-05-21 DIAGNOSIS — E162 Hypoglycemia, unspecified: Secondary | ICD-10-CM

## 2015-05-21 DIAGNOSIS — Z9049 Acquired absence of other specified parts of digestive tract: Secondary | ICD-10-CM | POA: Insufficient documentation

## 2015-05-21 DIAGNOSIS — Z7982 Long term (current) use of aspirin: Secondary | ICD-10-CM | POA: Insufficient documentation

## 2015-05-21 DIAGNOSIS — R079 Chest pain, unspecified: Secondary | ICD-10-CM

## 2015-05-21 DIAGNOSIS — N390 Urinary tract infection, site not specified: Secondary | ICD-10-CM | POA: Insufficient documentation

## 2015-05-21 DIAGNOSIS — F17210 Nicotine dependence, cigarettes, uncomplicated: Secondary | ICD-10-CM | POA: Insufficient documentation

## 2015-05-21 DIAGNOSIS — Z79899 Other long term (current) drug therapy: Secondary | ICD-10-CM | POA: Insufficient documentation

## 2015-05-21 DIAGNOSIS — Z794 Long term (current) use of insulin: Secondary | ICD-10-CM | POA: Insufficient documentation

## 2015-05-21 DIAGNOSIS — Z90710 Acquired absence of both cervix and uterus: Secondary | ICD-10-CM | POA: Insufficient documentation

## 2015-05-21 LAB — BASIC METABOLIC PANEL
Anion gap: 8 (ref 5–15)
BUN: 15 mg/dL (ref 6–20)
CO2: 27 mmol/L (ref 22–32)
Calcium: 9.2 mg/dL (ref 8.9–10.3)
Chloride: 104 mmol/L (ref 101–111)
Creatinine, Ser: 1.02 mg/dL — ABNORMAL HIGH (ref 0.44–1.00)
GFR calc Af Amer: >60 mL/min (ref >60)
GFR calc non Af Amer: 59 mL/min — ABNORMAL LOW (ref >60)
Glucose, Bld: 197 mg/dL — ABNORMAL HIGH (ref 65–99)
Potassium: 3.7 mmol/L (ref 3.5–5.1)
Sodium: 139 mmol/L (ref 135–145)

## 2015-05-21 LAB — URINE MICROSCOPIC-ADD ON

## 2015-05-21 LAB — CBC
HCT: 42.7 % (ref 36.0–46.0)
Hemoglobin: 14.2 g/dL (ref 12.0–15.0)
MCH: 30.9 pg (ref 26.0–34.0)
MCHC: 33.3 g/dL (ref 30.0–36.0)
MCV: 92.8 fL (ref 78.0–100.0)
Platelets: 224 10*3/uL (ref 150–400)
RBC: 4.6 MIL/uL (ref 3.87–5.11)
RDW: 13.4 % (ref 11.5–15.5)
WBC: 7.2 10*3/uL (ref 4.0–10.5)

## 2015-05-21 LAB — TROPONIN I: Troponin I: <0.03 ng/mL (ref <0.031)

## 2015-05-21 LAB — URINALYSIS, ROUTINE W REFLEX MICROSCOPIC
Bilirubin Urine: NEGATIVE
Glucose, UA: 100 mg/dL — AB
Hgb urine dipstick: NEGATIVE
Ketones, ur: NEGATIVE mg/dL
Nitrite: NEGATIVE
Protein, ur: NEGATIVE mg/dL
Specific Gravity, Urine: <1.005 — ABNORMAL LOW (ref 1.005–1.030)
pH: 5.5 (ref 5.0–8.0)

## 2015-05-21 MED ORDER — CEPHALEXIN 500 MG PO CAPS: 500.0000 mg | ORAL_CAPSULE | Freq: Three times a day (TID) | ORAL | Status: DC

## 2015-05-21 NOTE — ED Notes (Signed)
EDP in room with pt and family.

## 2015-05-21 NOTE — ED Notes (Signed)
Pt is here for chest pain that started this morning. Family states she has trouble regulating her CBG, upon triage is 160's, per husband she drank 2 soft drinks before coming here. Husband states she lost consciousness before coming to the hospital.

## 2015-05-21 NOTE — ED Provider Notes (Signed)
CSN: [id number]     Arrival date & time 05/21/15  1039 History  By signing my name below, I, Veronica Moses, attest that this documentation has been prepared under the direction and in the presence of Veronica Manifold, MD. Electronically Signed: Meriel Moses, ED Scribe. 05/21/2015. 1:09 PM.  Chief Complaint  Patient presents with  . Chest Pain  . Hypoglycemia   The history is provided by the patient. No language interpreter was used.   HPI Comments: Veronica Moses is a 60 y.o. female, with a h/o COPD and DM, who presents to the Emergency Department complaining of hypoglycemia reading at 45 this morning. Pt states after CBG reading she  went to get something to drink but does not recall the events following and feels confused. Per husband, the pt fell asleep on the couch following the episode of hypoglycemia and was difficult to wake. He states she had a 'glazed look in her eyes' when she woke up from sleeping on the couch this morning. Husband then drove pt to the ED and notes she experienced 2 episodes of LOC while in the car stating she 'blacked out' and he had to shake her to get her to come to. She has consumed 2 sodas before arriving to ED. She did not take her Lantus this morning due to hypoglycemia but normally takes Lantus each morning and uses a sliding scale of novolog throughout the day. Pt states she ate normally last night.    Pt reports onset of constant, tight, centralized, non radiating, chest pain onset while on the way to the ED, just PTA. Denies diaphoresis and SOB. Husband reports h/o similar episodes of hypoglycemia which triggers CP.   Pt also admits to urgency with urination and difficulty urinating that has been present for ~ 1 month. No dysuria or hematuria.    Past Medical History  Diagnosis Date  . COPD (chronic obstructive pulmonary disease) ([place])   . Diabetes mellitus    Past Surgical History  Procedure Laterality Date  . Abdominal hysterectomy    . Nephrectomy     . Cholecystectomy    . Breast surgery    . Colonoscopy with esophagogastroduodenoscopy (egd) N/A 03/04/2013    Procedure: COLONOSCOPY WITH ESOPHAGOGASTRODUODENOSCOPY (EGD);  Surgeon: Rogene Houston, MD;  Location: [facility];  Service: Endoscopy;  Laterality: N/A;  1200-moved to [place] notified pt   Family History  Problem Relation Age of Onset  . Colon cancer Neg Hx   . CAD      family history  . Diabetes Father   . Diabetes Brother    Social History  Substance Use Topics  . Smoking status: Former Smoker -- 1.00 packs/day for 26 years    Types: Cigarettes  . Smokeless tobacco: Former Systems developer    Quit date: 05/15/2012  . Alcohol Use: No   OB History    No data available     Review of Systems  Constitutional: Negative for diaphoresis.  Respiratory: Negative for shortness of breath.   Cardiovascular: Positive for chest pain.  Genitourinary: Positive for urgency and difficulty urinating. Negative for dysuria and hematuria.  Neurological: Positive for syncope.  A complete 10 system review of systems was obtained and is otherwise negative except at noted in the HPI and PMH.  Allergies  Demerol and Dilaudid  Home Medications   Prior to Admission medications   Medication Sig Start Date End Date Taking? Authorizing Provider  acetaminophen (TYLENOL) 500 MG tablet Take 1,000 mg by mouth  every 6 (six) hours as needed for mild pain or moderate pain.   Yes Historical Provider, MD  ALPRAZolam Duanne Moron) 1 MG tablet Take 1 mg by mouth 3 (three) times daily as needed. For anxiety   Yes Historical Provider, MD  aspirin EC 81 MG tablet Take 81 mg by mouth daily.   Yes Historical Provider, MD  cyclobenzaprine (FLEXERIL) 10 MG tablet Take 1 tablet (10 mg total) by mouth 3 (three) times daily as needed for muscle spasms. 05/31/14  Yes Orpah Greek, MD  furosemide (LASIX) 40 MG tablet Take 40 mg by mouth daily.    Yes Historical Provider, MD  insulin glargine (LANTUS) 100 UNIT/ML  injection Inject 26 Units into the skin every morning.    Yes Historical Provider, MD  NOVOLOG FLEXPEN 100 UNIT/ML FlexPen Inject 2-14 Units into the skin 3 (three) times daily before meals.  04/27/14  Yes Historical Provider, MD  pantoprazole (PROTONIX) 40 MG tablet Take 40 mg by mouth 2 (two) times daily as needed (acid reflux). 03/04/13  Yes Rogene Houston, MD  PRESCRIPTION MEDICATION Apply 1 application topically 2 (two) times daily. Apply cream to feet nightly   Yes Historical Provider, MD  RESTASIS 0.05 % ophthalmic emulsion Apply 1 drop to eye 2 (two) times daily. 04/29/14  Yes Historical Provider, MD  cephALEXin (KEFLEX) 500 MG capsule Take 1 capsule (500 mg total) by mouth 3 (three) times daily. 05/21/15   Veronica Manifold, MD   BP 130/53 mmHg  Pulse 72  Temp(Src) 97.7 F (36.5 C) (Oral)  Resp 16  Wt 107 lb (48.535 kg)  SpO2 100% Physical Exam  Constitutional: She is oriented to person, place, and time. She appears well-developed and well-nourished. No distress.  HENT:  Head: Normocephalic and atraumatic.  Eyes: EOM are normal.  Neck: Normal range of motion.  Cardiovascular: Normal rate and regular rhythm.   Murmur heard. Systolic murmur. Somewhat anxious.   Pulmonary/Chest: Effort normal and breath sounds normal.  Abdominal: Soft. She exhibits no distension. There is no tenderness.  Musculoskeletal: Normal range of motion.  Neurological: She is alert and oriented to person, place, and time.  Skin: Skin is warm and dry.  Psychiatric: She has a normal mood and affect. Judgment normal.  Nursing note and vitals reviewed.   ED Course  Procedures  DIAGNOSTIC STUDIES: Oxygen Saturation is 100% on RA, normal by my interpretation.    COORDINATION OF CARE: 11:40 AM Discussed treatment plan with pt at bedside which includes repeat CBGs and pt agreed to plan.   Labs Review Labs Reviewed  BASIC METABOLIC PANEL - Abnormal; Notable for the following:    Glucose, Bld 197 (*)     Creatinine, Ser 1.02 (*)    GFR calc non Af Amer 59 (*)    All other components within normal limits  URINALYSIS, ROUTINE W REFLEX MICROSCOPIC (NOT AT [facility]) - Abnormal; Notable for the following:    Color, Urine STRAW (*)    Specific Gravity, Urine <1.005 (*)    Glucose, UA 100 (*)    Leukocytes, UA TRACE (*)    All other components within normal limits  URINE MICROSCOPIC-ADD ON - Abnormal; Notable for the following:    Squamous Epithelial / LPF 6-30 (*)    Bacteria, UA MANY (*)    All other components within normal limits  URINE CULTURE  CBC  TROPONIN I    Imaging Review Dg Chest 2 View  05/21/2015  CLINICAL DATA:  Hypoglycemia this morning. Syncope  x2 today. Initial encounter. EXAM: CHEST  2 VIEW COMPARISON:  Single-view of the chest 07/13/2014 and 05/31/2014. FINDINGS: The lungs are clear. Heart size is normal. There is no pneumothorax or pleural effusion. No focal bony abnormality. Surgical clips in the upper abdomen are noted. IMPRESSION: No acute disease. Electronically Signed   By: Inge Rise M.D.   On: 05/21/2015 11:34   I have personally reviewed and evaluated these images and lab results as part of my medical decision-making.   EKG Interpretation   Date/Time:  Monday May 21 2015 10:46:29 EST Ventricular Rate:  96 PR Interval:  127 QRS Duration: 119 QT Interval:  395 QTC Calculation: 499 R Axis:   98 Text Interpretation:  Sinus rhythm Baseline wander Consider right atrial  enlargement Nonspecific intraventricular conduction delay Confirmed by  Quandra Fedorchak  MD, Karyss Frese ([id number]) on 05/21/2015 11:20:50 AM      MDM   Final diagnoses:  Hypoglycemia  Chest pain, unspecified chest pain type  UTI (lower urinary tract infection)    [age]-year-old female with an episode of hypoglycemia which has since resolved. Her chest pain is atypical. Workup has been fairly unremarkable. Have a low suspicion for ACS, PE, dissection or other emergent etiology. Her urinalysis is not  overly impressive, but given her symptoms and solitary kidney, will treat for possible UTI. Patient currently feels much better. She would like to go home. I feel this is reasonable. Return precautions were discussed.  I personally preformed the services scribed in my presence. The recorded information has been reviewed is accurate. Veronica Manifold, MD.    Veronica Manifold, MD 05/24/15 [phone]

## 2015-05-21 NOTE — ED Notes (Signed)
Pt ambulatory to bathroom. Back to room with standby assist.

## 2015-05-21 NOTE — ED Notes (Signed)
Pt informed of need for urine specimen  

## 2015-05-21 NOTE — Discharge Instructions (Signed)

## 2015-05-22 LAB — URINE CULTURE: Culture: NO GROWTH

## 2015-07-11 IMAGING — DX DG LUMBAR SPINE COMPLETE 4+V
5 series · 5 of 5 positions shown · non-contrast
Comparison: CT of the abdomen and pelvis 01/04/2013

CLINICAL DATA: Fell out of bed today. Unable to bear weight. Low
back pain.

EXAM:
LUMBAR SPINE - COMPLETE 4+ VIEW

[l-spine ap]
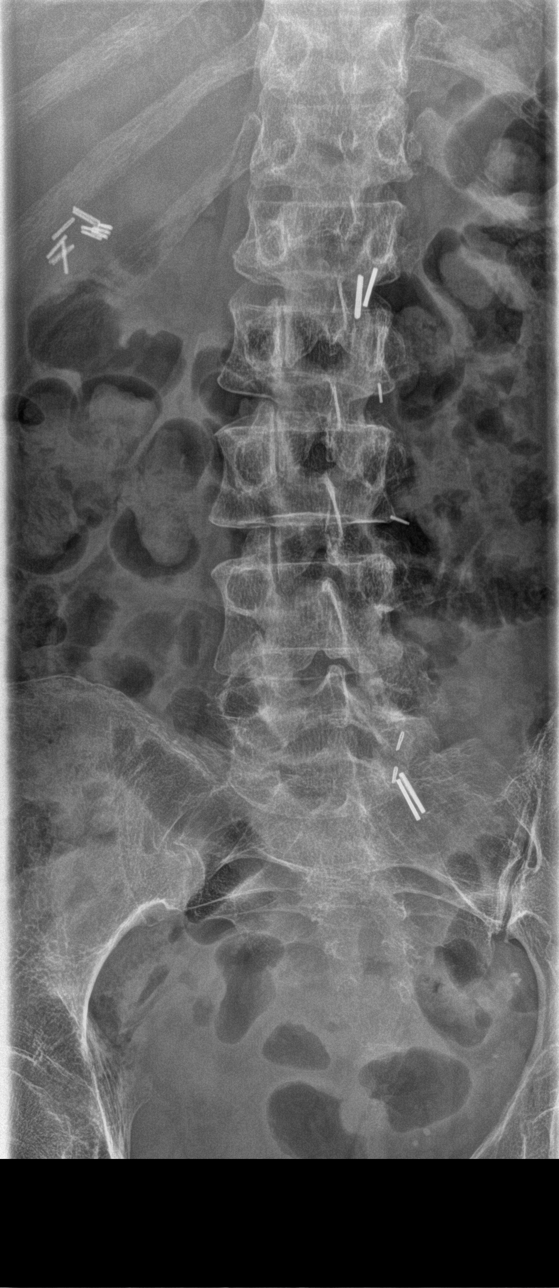

[l-spine obl (1 of 2)]
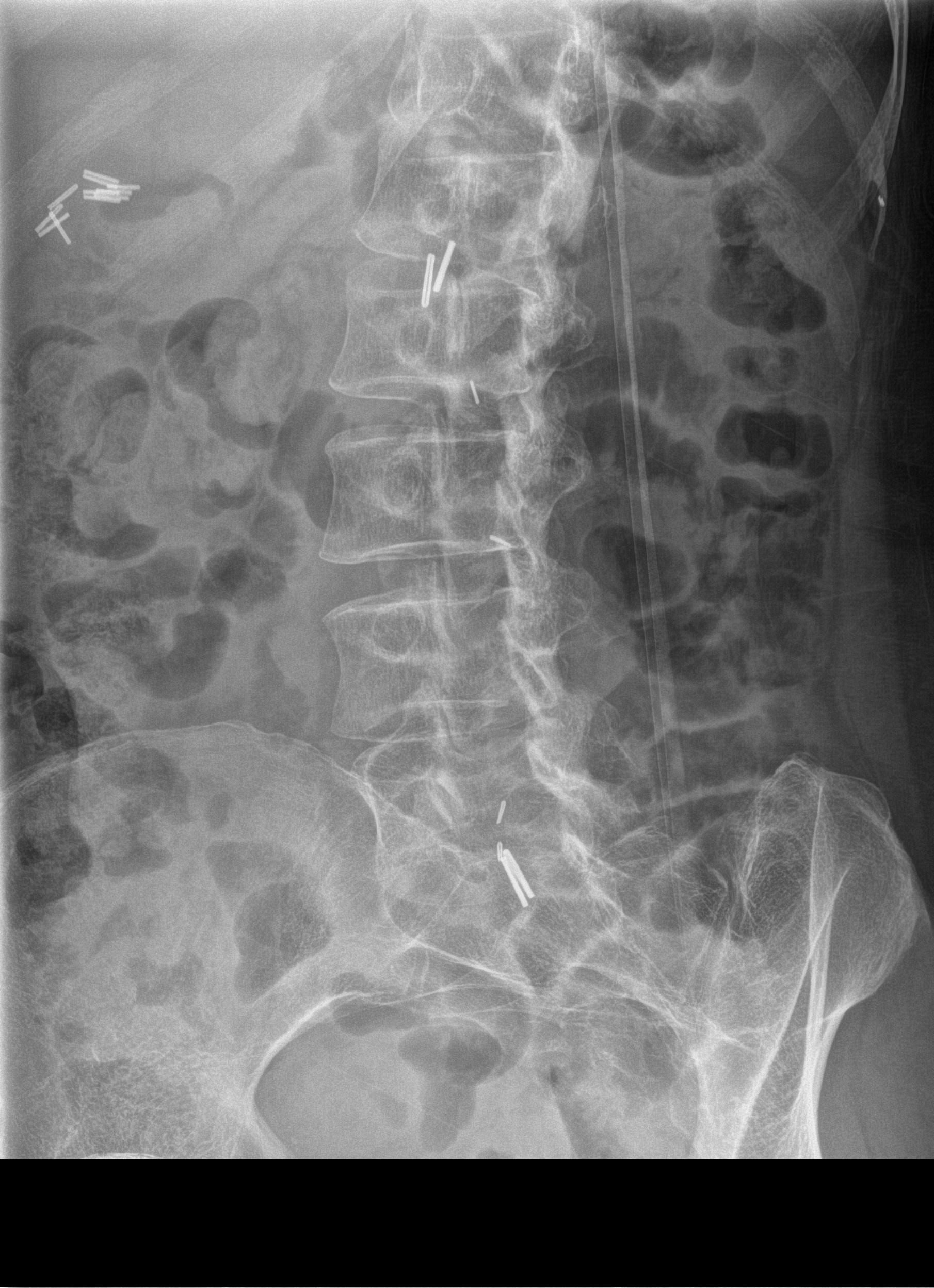

[l-spine obl (2 of 2)]
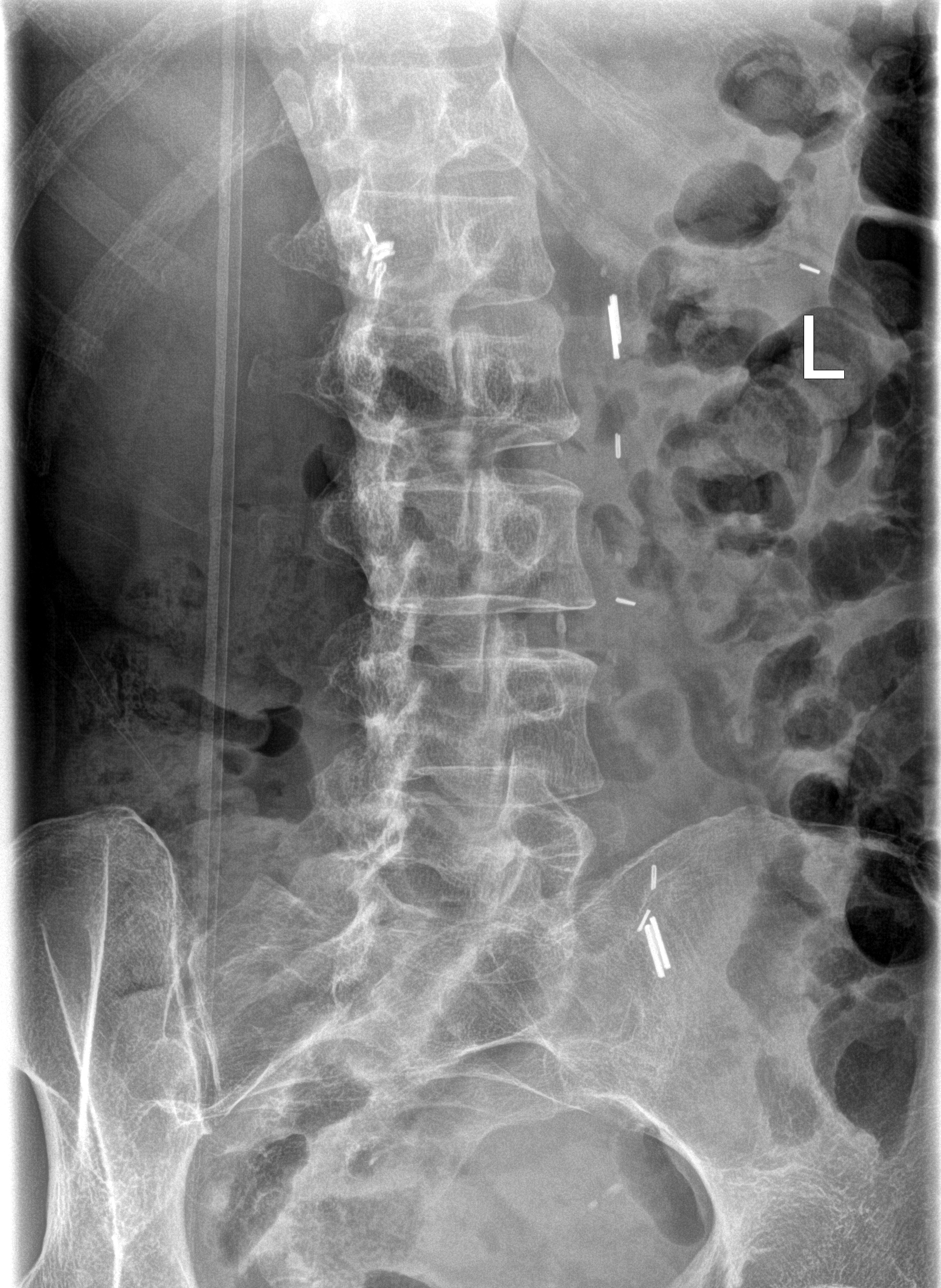

[l-spine lat]
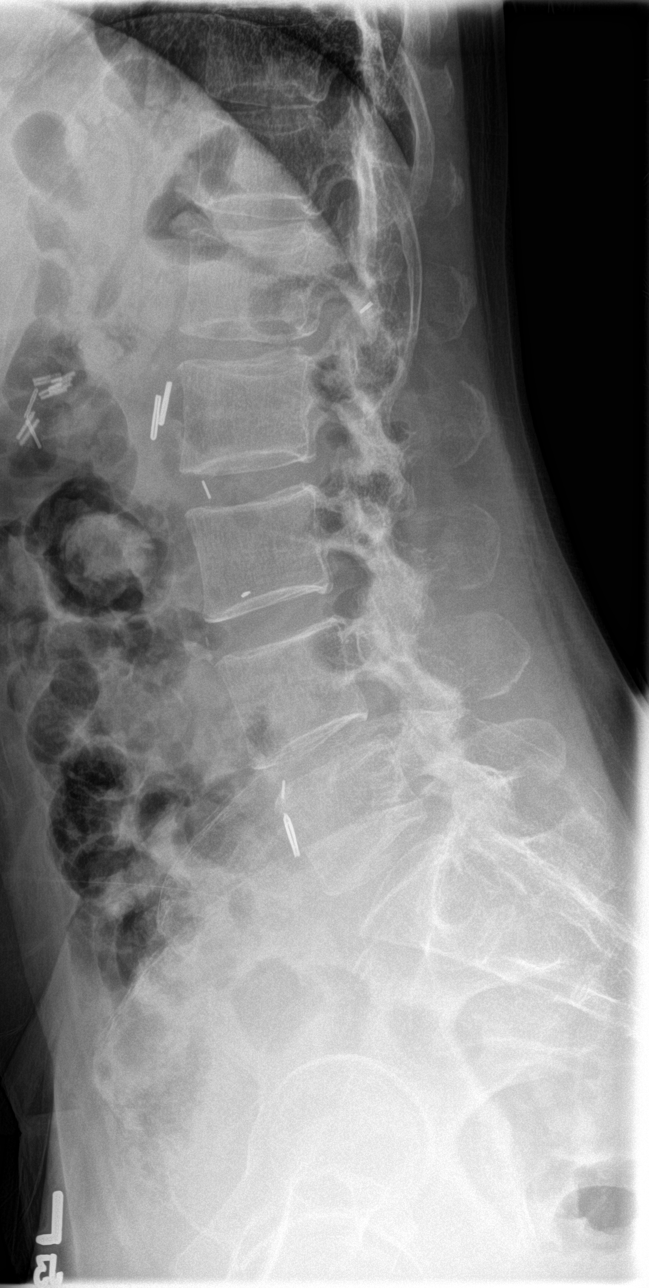

[l-spine spot]
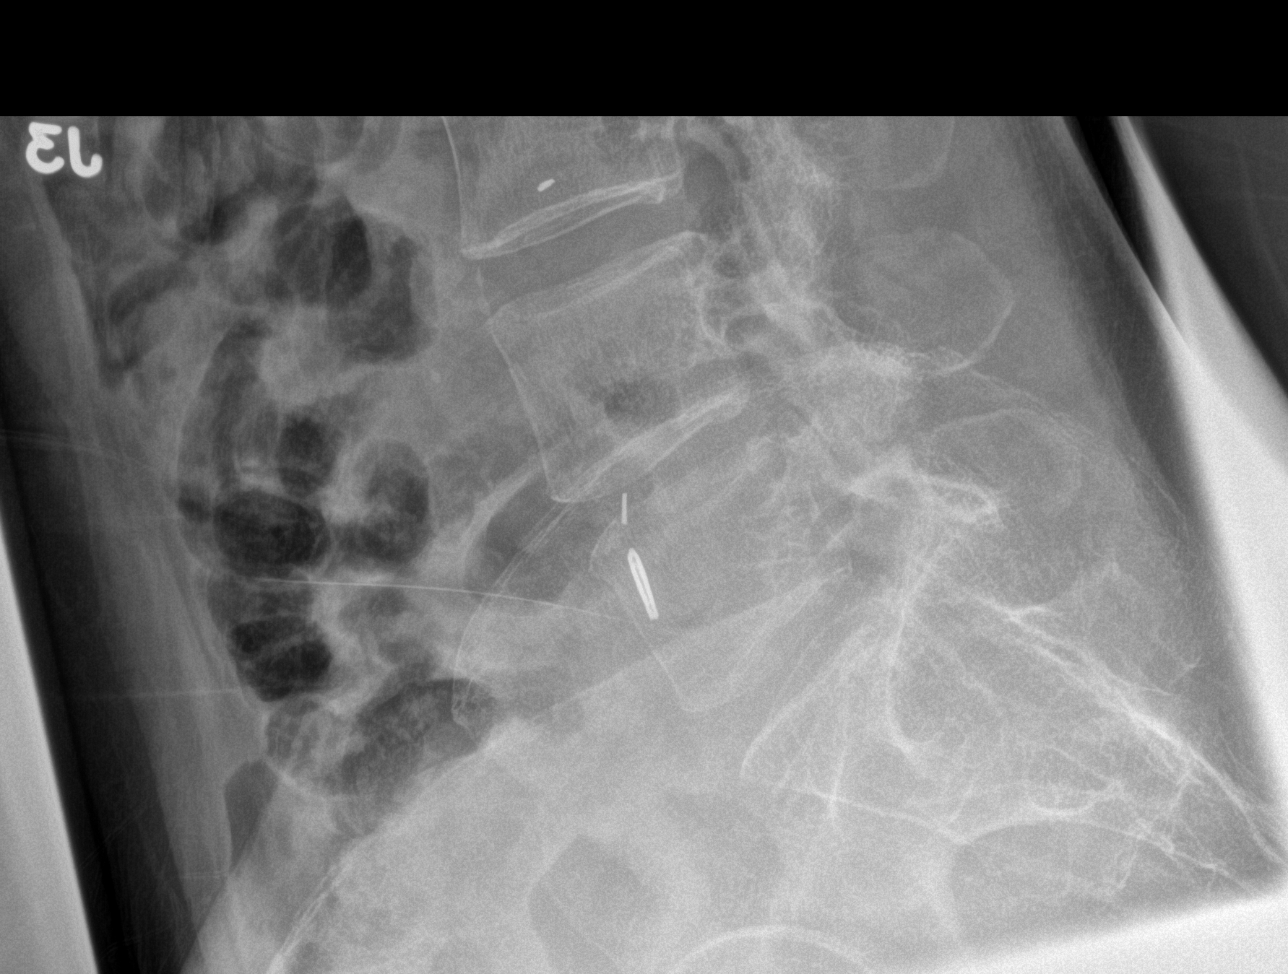

[5 of 5 positions shown; findings below may reference images not displayed]

FINDINGS: Five non rib-bearing lumbar type vertebral bodies are present.
Vertebral body heights and alignment are normal. Mild degenerative
changes are noted in the lower lumbar facets. No acute fracture or
traumatic subluxation is evident.
IMPRESSION: 1. Mild degenerative changes in the lower lumbar spine.
2. No acute abnormality.

## 2015-08-16 DIAGNOSIS — R569 Unspecified convulsions: Secondary | ICD-10-CM

## 2015-08-16 HISTORY — DX: Unspecified convulsions: R56.9

## 2015-08-20 ENCOUNTER — Inpatient Hospital Stay (HOSPITAL_COMMUNITY)
Admission: EM | Admit: 2015-08-20 | Discharge: 2015-08-25 | DRG: 100 | Disposition: A | Discharge status: Home Health | Payer: Medicaid Other | Attending: Pulmonary Disease | Admitting: Pulmonary Disease

## 2015-08-20 ENCOUNTER — Encounter (HOSPITAL_COMMUNITY): Payer: Self-pay | Admitting: *Deleted

## 2015-08-20 ENCOUNTER — Emergency Department (HOSPITAL_COMMUNITY): Payer: Medicaid Other

## 2015-08-20 DIAGNOSIS — Z87891 Personal history of nicotine dependence: Secondary | ICD-10-CM | POA: Diagnosis not present

## 2015-08-20 DIAGNOSIS — Z794 Long term (current) use of insulin: Secondary | ICD-10-CM

## 2015-08-20 DIAGNOSIS — Z833 Family history of diabetes mellitus: Secondary | ICD-10-CM

## 2015-08-20 DIAGNOSIS — Z524 Kidney donor: Secondary | ICD-10-CM | POA: Diagnosis not present

## 2015-08-20 DIAGNOSIS — E872 Acidosis: Secondary | ICD-10-CM | POA: Diagnosis present

## 2015-08-20 DIAGNOSIS — Z8249 Family history of ischemic heart disease and other diseases of the circulatory system: Secondary | ICD-10-CM

## 2015-08-20 DIAGNOSIS — J9600 Acute respiratory failure, unspecified whether with hypoxia or hypercapnia: Secondary | ICD-10-CM | POA: Diagnosis present

## 2015-08-20 DIAGNOSIS — Z7982 Long term (current) use of aspirin: Secondary | ICD-10-CM

## 2015-08-20 DIAGNOSIS — E1165 Type 2 diabetes mellitus with hyperglycemia: Secondary | ICD-10-CM | POA: Diagnosis present

## 2015-08-20 DIAGNOSIS — J449 Chronic obstructive pulmonary disease, unspecified: Secondary | ICD-10-CM | POA: Diagnosis present

## 2015-08-20 DIAGNOSIS — E1010 Type 1 diabetes mellitus with ketoacidosis without coma: Secondary | ICD-10-CM

## 2015-08-20 DIAGNOSIS — R569 Unspecified convulsions: Secondary | ICD-10-CM

## 2015-08-20 DIAGNOSIS — J9690 Respiratory failure, unspecified, unspecified whether with hypoxia or hypercapnia: Secondary | ICD-10-CM

## 2015-08-20 DIAGNOSIS — E1110 Type 2 diabetes mellitus with ketoacidosis without coma: Secondary | ICD-10-CM | POA: Diagnosis present

## 2015-08-20 DIAGNOSIS — J441 Chronic obstructive pulmonary disease with (acute) exacerbation: Secondary | ICD-10-CM | POA: Diagnosis present

## 2015-08-20 DIAGNOSIS — G40901 Epilepsy, unspecified, not intractable, with status epilepticus: Principal | ICD-10-CM | POA: Diagnosis present

## 2015-08-20 DIAGNOSIS — I248 Other forms of acute ischemic heart disease: Secondary | ICD-10-CM | POA: Diagnosis present

## 2015-08-20 DIAGNOSIS — E119 Type 2 diabetes mellitus without complications: Secondary | ICD-10-CM

## 2015-08-20 HISTORY — DX: Urinary tract infection, site not specified: N39.0

## 2015-08-20 LAB — CBC WITH DIFFERENTIAL/PLATELET
BASOS PCT: 0 %
Basophils Absolute: 0 10*3/uL (ref 0.0–0.1)
Eosinophils Absolute: 0 10*3/uL (ref 0.0–0.7)
Eosinophils Relative: 0 %
HEMATOCRIT: 40.9 % (ref 36.0–46.0)
HEMOGLOBIN: 13.1 g/dL (ref 12.0–15.0)
Lymphocytes Relative: 26 %
Lymphs Abs: 3.7 10*3/uL (ref 0.7–4.0)
MCH: 29.8 pg (ref 26.0–34.0)
MCHC: 32 g/dL (ref 30.0–36.0)
MCV: 93 fL (ref 78.0–100.0)
MONOS PCT: 5 %
Monocytes Absolute: 0.7 10*3/uL (ref 0.1–1.0)
NEUTROS ABS: 10 10*3/uL — AB (ref 1.7–7.7)
NEUTROS PCT: 70 %
Platelets: 271 10*3/uL (ref 150–400)
RBC: 4.4 MIL/uL (ref 3.87–5.11)
RDW: 13.1 % (ref 11.5–15.5)
WBC: 14.5 10*3/uL — AB (ref 4.0–10.5)

## 2015-08-20 LAB — COMPREHENSIVE METABOLIC PANEL
ALK PHOS: 32 U/L — AB (ref 38–126)
ALT: 21 U/L (ref 14–54)
ANION GAP: 20 — AB (ref 5–15)
AST: 34 U/L (ref 15–41)
Albumin: 3.8 g/dL (ref 3.5–5.0)
BUN: 17 mg/dL (ref 6–20)
CALCIUM: 8.3 mg/dL — AB (ref 8.9–10.3)
CO2: 14 mmol/L — AB (ref 22–32)
Chloride: 99 mmol/L — ABNORMAL LOW (ref 101–111)
Creatinine, Ser: 1.39 mg/dL — ABNORMAL HIGH (ref 0.44–1.00)
GFR calc non Af Amer: 40 mL/min — ABNORMAL LOW (ref >60)
GFR, EST AFRICAN AMERICAN: 47 mL/min — AB (ref >60)
Glucose, Bld: 404 mg/dL — ABNORMAL HIGH (ref 65–99)
Potassium: 3.5 mmol/L (ref 3.5–5.1)
SODIUM: 133 mmol/L — AB (ref 135–145)
TOTAL PROTEIN: 6.8 g/dL (ref 6.5–8.1)
Total Bilirubin: 0.4 mg/dL (ref 0.3–1.2)

## 2015-08-20 LAB — CSF CELL COUNT WITH DIFFERENTIAL
RBC COUNT CSF: 390 /mm3 — AB
RBC Count, CSF: 1392 /mm3 — ABNORMAL HIGH
Tube #: 1
Tube #: 4
WBC CSF: 0 /mm3 (ref 0–5)
WBC CSF: 0 /mm3 (ref 0–5)

## 2015-08-20 LAB — URINALYSIS, ROUTINE W REFLEX MICROSCOPIC
BILIRUBIN URINE: NEGATIVE
GLUCOSE, UA: 500 mg/dL — AB
Ketones, ur: NEGATIVE mg/dL
Leukocytes, UA: NEGATIVE
Nitrite: NEGATIVE
Protein, ur: NEGATIVE mg/dL
SPECIFIC GRAVITY, URINE: 1.015 (ref 1.005–1.030)
pH: 5.5 (ref 5.0–8.0)

## 2015-08-20 LAB — TROPONIN I: TROPONIN I: 0.06 ng/mL — AB (ref <0.031)

## 2015-08-20 LAB — TRIGLYCERIDES: Triglycerides: 124 mg/dL (ref <150)

## 2015-08-20 LAB — PROTIME-INR
INR: 1.21 (ref 0.00–1.49)
PROTHROMBIN TIME: 15.4 s — AB (ref 11.6–15.2)

## 2015-08-20 LAB — GLUCOSE, CSF: GLUCOSE CSF: 139 mg/dL — AB (ref 40–70)

## 2015-08-20 LAB — RAPID URINE DRUG SCREEN, HOSP PERFORMED
Amphetamines: NOT DETECTED
BARBITURATES: NOT DETECTED
BENZODIAZEPINES: POSITIVE — AB
COCAINE: NOT DETECTED
OPIATES: NOT DETECTED
Tetrahydrocannabinol: NOT DETECTED

## 2015-08-20 LAB — I-STAT CG4 LACTIC ACID, ED: Lactic Acid, Venous: 15.24 mmol/L (ref 0.5–2.0)

## 2015-08-20 LAB — URINE MICROSCOPIC-ADD ON

## 2015-08-20 LAB — GLUCOSE, CAPILLARY: GLUCOSE-CAPILLARY: 405 mg/dL — AB (ref 65–99)

## 2015-08-20 LAB — PROTEIN, CSF: Total  Protein, CSF: 37 mg/dL (ref 15–45)

## 2015-08-20 LAB — CK: CK TOTAL: 60 U/L (ref 38–234)

## 2015-08-20 LAB — CBG MONITORING, ED: GLUCOSE-CAPILLARY: 443 mg/dL — AB (ref 65–99)

## 2015-08-20 MED ORDER — ONDANSETRON HCL 4 MG/2ML IJ SOLN: 4.0000 mg | Freq: Three times a day (TID) | INTRAMUSCULAR | Status: AC | PRN

## 2015-08-20 MED ORDER — DEXTROSE-NACL 5-0.45 % IV SOLN
INTRAVENOUS | Status: DC
  Administered 2015-08-21: 01:00:00 via INTRAVENOUS

## 2015-08-20 MED ORDER — SODIUM CHLORIDE 0.9 % IV SOLN
500.0000 mg | Freq: Once | INTRAVENOUS | Status: AC
Start: 2015-08-20 — End: 2015-08-20
  Administered 2015-08-20: 500 mg via INTRAVENOUS
  Filled 2015-08-20: qty 5

## 2015-08-20 MED ORDER — PROPOFOL 1000 MG/100ML IV EMUL
5.0000 ug/kg/min | Freq: Once | INTRAVENOUS | Status: AC
  Administered 2015-08-20: 20 ug/kg/min via INTRAVENOUS
  Filled 2015-08-20: qty 100

## 2015-08-20 MED ORDER — POTASSIUM CHLORIDE 10 MEQ/100ML IV SOLN
10.0000 meq | INTRAVENOUS | Status: AC
  Administered 2015-08-21 (×2): 10 meq via INTRAVENOUS
  Filled 2015-08-20 (×2): qty 100

## 2015-08-20 MED ORDER — INSULIN REGULAR HUMAN 100 UNIT/ML IJ SOLN
INTRAMUSCULAR | Status: AC
  Filled 2015-08-20: qty 2.5

## 2015-08-20 MED ORDER — LORAZEPAM 2 MG/ML IJ SOLN
INTRAMUSCULAR | Status: DC | PRN
  Administered 2015-08-20: 1 mg via INTRAVENOUS

## 2015-08-20 MED ORDER — ETOMIDATE 2 MG/ML IV SOLN
INTRAVENOUS | Status: DC | PRN
  Administered 2015-08-20: 10 mg via INTRAVENOUS

## 2015-08-20 MED ORDER — ASPIRIN 300 MG RE SUPP
300.0000 mg | Freq: Every day | RECTAL | Status: DC
  Administered 2015-08-21 – 2015-08-22 (×2): 300 mg via RECTAL
  Filled 2015-08-20 (×2): qty 1

## 2015-08-20 MED ORDER — SODIUM CHLORIDE 0.9 % IV SOLN: INTRAVENOUS | Status: AC

## 2015-08-20 MED ORDER — HEPARIN SODIUM (PORCINE) 5000 UNIT/ML IJ SOLN: 5000.0000 [IU] | Freq: Three times a day (TID) | INTRAMUSCULAR | Status: DC

## 2015-08-20 MED ORDER — SODIUM CHLORIDE 0.9 % IV SOLN
INTRAVENOUS | Status: DC
  Administered 2015-08-20: 3.5 [IU]/h via INTRAVENOUS
  Filled 2015-08-20: qty 2.5

## 2015-08-20 MED ORDER — SODIUM CHLORIDE 0.9 % IV BOLUS (SEPSIS)
500.0000 mL | Freq: Once | INTRAVENOUS | Status: AC
  Administered 2015-08-20: 500 mL via INTRAVENOUS

## 2015-08-20 MED ORDER — LEVETIRACETAM 500 MG/5ML IV SOLN
500.0000 mg | Freq: Two times a day (BID) | INTRAVENOUS | Status: DC
  Administered 2015-08-20: 500 mg via INTRAVENOUS
  Filled 2015-08-20 (×4): qty 5

## 2015-08-20 MED ORDER — PROPOFOL 1000 MG/100ML IV EMUL
5.0000 ug/kg/min | INTRAVENOUS | Status: DC
  Administered 2015-08-20: 60 ug/kg/min via INTRAVENOUS
  Administered 2015-08-21: 50 ug/kg/min via INTRAVENOUS
  Administered 2015-08-21 (×3): 60 ug/kg/min via INTRAVENOUS
  Administered 2015-08-22: 45 ug/kg/min via INTRAVENOUS
  Filled 2015-08-20 (×6): qty 100

## 2015-08-20 MED ORDER — SODIUM CHLORIDE 0.9 % IV SOLN: 250.0000 mL | INTRAVENOUS | Status: DC | PRN

## 2015-08-20 MED ORDER — HEPARIN SODIUM (PORCINE) 5000 UNIT/ML IJ SOLN
5000.0000 [IU] | Freq: Three times a day (TID) | INTRAMUSCULAR | Status: DC
  Administered 2015-08-21 – 2015-08-25 (×14): 5000 [IU] via SUBCUTANEOUS
  Filled 2015-08-20 (×14): qty 1

## 2015-08-20 MED ORDER — SODIUM CHLORIDE 0.9 % IV SOLN
INTRAVENOUS | Status: DC
  Administered 2015-08-20: 23:00:00 via INTRAVENOUS

## 2015-08-20 MED ORDER — VANCOMYCIN HCL IN DEXTROSE 1-5 GM/200ML-% IV SOLN
1000.0000 mg | Freq: Once | INTRAVENOUS | Status: AC
  Administered 2015-08-20: 1000 mg via INTRAVENOUS
  Filled 2015-08-20: qty 200

## 2015-08-20 MED ORDER — SODIUM CHLORIDE 0.9 % IV BOLUS (SEPSIS)
1000.0000 mL | Freq: Once | INTRAVENOUS | Status: AC
  Administered 2015-08-20: 1000 mL via INTRAVENOUS

## 2015-08-20 MED ORDER — LEVETIRACETAM IN NACL 1000 MG/100ML IV SOLN
INTRAVENOUS | Status: AC
  Filled 2015-08-20: qty 100

## 2015-08-20 MED ORDER — LORAZEPAM 2 MG/ML IJ SOLN
1.0000 mg | Freq: Once | INTRAMUSCULAR | Status: AC
  Administered 2015-08-20: 1 mg via INTRAVENOUS

## 2015-08-20 MED ORDER — LEVETIRACETAM IN NACL 500 MG/100ML IV SOLN
INTRAVENOUS | Status: AC
Start: 2015-08-20 — End: 2015-08-20
  Filled 2015-08-20: qty 100

## 2015-08-20 MED ORDER — LORAZEPAM 2 MG/ML IJ SOLN
INTRAMUSCULAR | Status: AC
  Filled 2015-08-20: qty 1

## 2015-08-20 MED ORDER — DEXTROSE 5 % IV SOLN
10.0000 mg/kg | Freq: Two times a day (BID) | INTRAVENOUS | Status: DC
  Administered 2015-08-21: 485 mg via INTRAVENOUS
  Filled 2015-08-20 (×3): qty 9.7

## 2015-08-20 MED ORDER — DEXTROSE 5 % IV SOLN
10.0000 mg/kg | Freq: Once | INTRAVENOUS | Status: AC
  Administered 2015-08-20: 485 mg via INTRAVENOUS
  Filled 2015-08-20: qty 9.7

## 2015-08-20 MED ORDER — PIPERACILLIN-TAZOBACTAM 3.375 G IVPB 30 MIN
3.3750 g | Freq: Once | INTRAVENOUS | Status: AC
  Administered 2015-08-20: 3.375 g via INTRAVENOUS
  Filled 2015-08-20: qty 50

## 2015-08-20 MED ORDER — ROCURONIUM BROMIDE 50 MG/5ML IV SOLN
INTRAVENOUS | Status: DC | PRN
  Administered 2015-08-20: 50 mg via INTRAVENOUS

## 2015-08-20 MED ORDER — ACYCLOVIR SODIUM 50 MG/ML IV SOLN
INTRAVENOUS | Status: AC
  Filled 2015-08-20: qty 10

## 2015-08-20 NOTE — ED Notes (Signed)
Pt is having seizure upon EDP assessment

## 2015-08-20 NOTE — H&P (Signed)
TRH H&P   Patient Demographics:    Javetta Pitkin, is a 60 y.o. female  MRN: [id number]   DOB - 04/04/1955  Admit Date - 08/20/2015  Outpatient Primary MD for the patient is Alonza Bogus, MD  Referring MD/NP/PA: Noemi Chapel  Outpatient Specialists: endocrinologist, Nani Skillern  Patient coming from: home  Chief Complaint  Patient presents with  . Seizures      HPI:    Tacey Eakes  is a 60 y.o. female, w dm2, Copd (not on home o2), who presents with confusion , starting about 2 pm, and then about 4pm pt's husband heard a thump and pt was found on the ground. Pt had 1 whole body jerk and her face had gone over to 1 side, and then released.  Pt was taken by EMS to ED and thought to have had seizure.   Pt had some headache earlier in the day.  Denies mosquito bite or tick bite. Pt had n/v x1 after seizure.      Review of systems:    In addition to the HPI above,  No Fever-chills, ? Headache, No changes with Vision or hearing, No problems swallowing food or Liquids, No Chest pain, Cough or Shortness of Breath, No Abdominal pain, No Nausea or Vommitting, Bowel movements are regular, No Blood in stool or Urine, No dysuria, No new skin rashes or bruises, No new joints pains-aches,  No new weakness, tingling, numbness in any extremity, No recent weight gain or loss, No polyuria, polydypsia or polyphagia, No significant Mental Stressors.  A full 10 point Review of Systems was done, except as stated above, all other Review of Systems were negative.   With Past History of the following :    Past Medical History  Diagnosis Date  . COPD (chronic obstructive pulmonary disease) ([place])   . Diabetes mellitus   . UTI (lower urinary tract infection)       Past Surgical History  Procedure Laterality Date  . Abdominal hysterectomy    . Nephrectomy    . Cholecystectomy    .  Breast surgery    . Colonoscopy with esophagogastroduodenoscopy (egd) N/A 03/04/2013    Procedure: COLONOSCOPY WITH ESOPHAGOGASTRODUODENOSCOPY (EGD);  Surgeon: Rogene Houston, MD;  Location: [facility];  Service: Endoscopy;  Laterality: N/A;  1200-moved to [place] notified pt      Social History:     Social History  Substance Use Topics  . Smoking status: Former Smoker -- 1.00 packs/day for 26 years    Types: Cigarettes  . Smokeless tobacco: Former Systems developer    Quit date: 05/15/2012  . Alcohol Use: No     Lives - at home  Mobility - typically ambulates without assistance   Family History :     Family History  Problem Relation Age of Onset  . Colon cancer Neg Hx   . CAD  family history  . Diabetes Father   . Diabetes Brother       Home Medications:   Prior to Admission medications   Medication Sig Start Date End Date Taking? Authorizing Provider  acetaminophen (TYLENOL) 500 MG tablet Take 1,000 mg by mouth every 6 (six) hours as needed for mild pain or moderate pain.   Yes Historical Provider, MD  ALPRAZolam Duanne Moron) 1 MG tablet Take 1 mg by mouth 3 (three) times daily as needed. For anxiety   Yes Historical Provider, MD  aspirin EC 81 MG tablet Take 81 mg by mouth daily.   Yes Historical Provider, MD  cyclobenzaprine (FLEXERIL) 10 MG tablet Take 1 tablet (10 mg total) by mouth 3 (three) times daily as needed for muscle spasms. 05/31/14  Yes Orpah Greek, MD  insulin glargine (LANTUS) 100 UNIT/ML injection Inject 22 Units into the skin every morning.    Yes Historical Provider, MD  NOVOLOG FLEXPEN 100 UNIT/ML FlexPen Inject 2-14 Units into the skin 3 (three) times daily before meals.  04/27/14  Yes Historical Provider, MD  RESTASIS 0.05 % ophthalmic emulsion Apply 1 drop to eye 2 (two) times daily. 04/29/14  Yes Historical Provider, MD  sertraline (ZOLOFT) 50 MG tablet TK 1 T PO QD 08/14/15  Yes Historical Provider, MD  cephALEXin (KEFLEX) 500 MG capsule Take 1  capsule (500 mg total) by mouth 3 (three) times daily. Patient not taking: Reported on 08/20/2015 05/21/15   Virgel Manifold, MD     Allergies:     Allergies  Allergen Reactions  . Demerol Other (See Comments)    hallucinations  . Dilaudid [Hydromorphone Hcl] Nausea And Vomiting     Physical Exam:   Vitals  Blood pressure 101/54, pulse 67, resp. rate 20, weight 48.535 kg (107 lb), SpO2 98 %.   1. General  lying in bed intubated, sedated  2. Normal affect and insight, Not Suicidal or Homicidal, Awake Alert, Oriented X 3.  3. No F.N deficits, ALL C.Nerves Intact, Strength unable to assess.   Plantars down going.   4. Ears and Eyes appear Normal, Conjunctivae clear, PERRLA. Pupils 1.35mm symmetric, Moist Oral Mucosa.  5. Supple Neck, No JVD, No cervical lymphadenopathy appriciated, No Carotid Bruits.  6. Symmetrical Chest wall movement, Good air movement bilaterally, CTAB.  7. RRR, No Gallops, Rubs or Murmurs, No Parasternal Heave.  8. Positive Bowel Sounds, Abdomen Soft, No tenderness, No organomegaly appriciated,No rebound -guarding or rigidity.  9.  No Cyanosis, Normal Skin Turgor, No Skin Rash or Bruise.  10. Good muscle tone,  joints appear normal , no effusions, Normal ROM.  11. No Palpable Lymph Nodes in Neck or Axillae      Data Review:    CBC  Recent Labs Lab 08/20/15 1750  WBC 14.5*  HGB 13.1  HCT 40.9  PLT 271  MCV 93.0  MCH 29.8  MCHC 32.0  RDW 13.1  LYMPHSABS 3.7  MONOABS 0.7  EOSABS 0.0  BASOSABS 0.0   ------------------------------------------------------------------------------------------------------------------  Chemistries   Recent Labs Lab 08/20/15 1750  NA 133*  K 3.5  CL 99*  CO2 14*  GLUCOSE 404*  BUN 17  CREATININE 1.39*  CALCIUM 8.3*  AST 34  ALT 21  ALKPHOS 32*  BILITOT 0.4   ------------------------------------------------------------------------------------------------------------------ CrCl cannot be calculated  (Unknown ideal weight.). ------------------------------------------------------------------------------------------------------------------ No results for input(s): TSH, T4TOTAL, T3FREE, THYROIDAB in the last 72 hours.  Invalid input(s): FREET3  Coagulation profile  Recent Labs Lab 08/20/15 1750  INR 1.21   -------------------------------------------------------------------------------------------------------------------  No results for input(s): DDIMER in the last 72 hours. -------------------------------------------------------------------------------------------------------------------  Cardiac Enzymes  Recent Labs Lab 08/20/15 1750  TROPONINI 0.06*   ------------------------------------------------------------------------------------------------------------------ No results found for: BNP   ---------------------------------------------------------------------------------------------------------------  Urinalysis    Component Value Date/Time   COLORURINE YELLOW 08/20/2015 1750   APPEARANCEUR CLEAR 08/20/2015 1750   LABSPEC 1.015 08/20/2015 1750   PHURINE 5.5 08/20/2015 1750   GLUCOSEU 500* 08/20/2015 1750   HGBUR SMALL* 08/20/2015 1750   BILIRUBINUR NEGATIVE 08/20/2015 1750   KETONESUR NEGATIVE 08/20/2015 1750   PROTEINUR NEGATIVE 08/20/2015 1750   UROBILINOGEN 0.2 07/13/2014 1507   NITRITE NEGATIVE 08/20/2015 1750   LEUKOCYTESUR NEGATIVE 08/20/2015 1750    ----------------------------------------------------------------------------------------------------------------   Imaging Results:    Ct Head Wo Contrast  08/20/2015  CLINICAL DATA:  Seizure today. EXAM: CT HEAD WITHOUT CONTRAST TECHNIQUE: Contiguous axial images were obtained from the base of the skull through the vertex without intravenous contrast. COMPARISON:  05/31/2014 FINDINGS: A small left frontal scalp hematoma is noted but no underlying skull fracture. The ventricles are in the midline without mass  effect or shift. They are normal in size and configuration. No extra-axial fluid collections are identified. No findings for hemispheric infarction an or intracranial hemorrhage. No mass lesion. The brainstem and cerebellum are grossly normal. IMPRESSION: No acute intracranial findings or skull fracture. No change since prior study. Electronically Signed   By: Marijo Sanes M.D.   On: 08/20/2015 18:42   Dg Chest Port 1 View  08/20/2015  CLINICAL DATA:  Check endotracheal tube placement EXAM: PORTABLE CHEST 1 VIEW COMPARISON:  05/21/2015 FINDINGS: Cardiac shadow is stable. An endotracheal tube is now seen 5.8 cm above the carina. A nasogastric catheter is seen within the stomach. The lungs are well-aerated without focal infiltrate or sizable effusion. No acute bony abnormality is seen. IMPRESSION: Tubes and lines in satisfactory position. No acute abnormality noted. Electronically Signed   By: Inez Catalina M.D.   On: 08/20/2015 18:18    ekg pending    Assessment & Plan:    Active Problems:   Diabetes ([place])   COPD (chronic obstructive pulmonary disease) ([place])   Status epilepticus ([place])    1. AMS secondary to Seizure do (new onset) MRI brain with and without contrast.  Keppra iv Awaiting LP results  2.  Syncope secondary to ? Seizure vs other etiology Telemetry Trop i q6h x3 Check carotid ultrasound Check cardiac echo  3. Tachycardia Trop i q6h x3 Check d dimer , if positive then CTA chest R/o PE Check tsh  4. Renal insufficiency Ns iv  5. Hyperglycemia ? dka Check abg Ns iv  6. Acute respiratory failure secondary to seizure/ AMS Intubated. 6/5 NGT 6/5    DVT Prophylaxis Heparin - - SCDs  AM Labs Ordered, also please review Full Orders  Family Communication: Admission, patients condition and plan of care including tests being ordered have been discussed with the patient and husband who indicate understanding and agree with the plan and Code Status.  Code Status FULL  CODE  Likely DC to  ???  Condition Critical  Consults called: neurology  Admission status: inpatient  Time spent in minutes : 55 minutes, critical care time   Jani Gravel M.D on 08/20/2015 at 7:45 PM  Between 7am to 7pm - Pager - [phone]. After 7pm go to [email or web address] - password [facility]  [facility] - Office  [phone]

## 2015-08-20 NOTE — ED Notes (Signed)
Pt has positive color change.

## 2015-08-20 NOTE — ED Notes (Signed)
Admitting md to bedside, family at bedside and updated by md

## 2015-08-20 NOTE — ED Notes (Signed)
Pt comes in by EMS for seizures. Pt was at home with her husband who found her around 4:30 states she wasn't respond and was having seizure like activity. Husband states she fell out of her chair and hit her forehead. Pt is not responding to staff at this time. Pt is opening her eyes but not responding to staff. Husband states she has no hx of seizures. Pt had IV established by EMS and was given 2mg  Versed intranasally in addition to 2 mg IV.   CBG 457 by EMS.

## 2015-08-20 NOTE — Progress Notes (Signed)
Pharmacy Note:  Initial antibiotics for Acyclovir ordered by EDP for Lumbar Puncture / HSV encephalitis?  Estimated Creatinine Clearance: 33 mL/min (by C-G formula based on Cr of 1.39).  BMET    Component Value Date/Time   NA 133* 08/20/2015 1750   K 3.5 08/20/2015 1750   CL 99* 08/20/2015 1750   CO2 14* 08/20/2015 1750   GLUCOSE 404* 08/20/2015 1750   BUN 17 08/20/2015 1750   CREATININE 1.39* 08/20/2015 1750   CALCIUM 8.3* 08/20/2015 1750   GFRNONAA 40* 08/20/2015 1750   GFRAA 47* 08/20/2015 1750      Allergies  Allergen Reactions  . Demerol Other (See Comments)    hallucinations  . Dilaudid [Hydromorphone Hcl] Nausea And Vomiting    Filed Vitals:   08/20/15 2015 08/20/15 2030  BP: 118/52 122/57  Pulse: 57 61  Resp: 15 20    Anti-infectives    Start     Dose/Rate Route Frequency Ordered Stop   08/20/15 1930  acyclovir (ZOVIRAX) 485 mg in dextrose 5 % 100 mL IVPB     10 mg/kg  48.5 kg 109.7 mL/hr over 60 Minutes Intravenous  Once 08/20/15 1918     08/20/15 1900  vancomycin (VANCOCIN) IVPB 1000 mg/200 mL premix     1,000 mg 200 mL/hr over 60 Minutes Intravenous  Once 08/20/15 1857 08/20/15 2042   08/20/15 1900  piperacillin-tazobactam (ZOSYN) IVPB 3.375 g     3.375 g 100 mL/hr over 30 Minutes Intravenous  Once 08/20/15 1857 08/20/15 2002      Plan: Initial doses of Acyclovir 10mg /kg X 1 ordered. F/U admission orders for further dosing if therapy continued.  Pricilla Larsson, [facility] 08/20/2015 8:48 PM   PM: Acyclovir to be completed upon admission. Acyclovir 10mg /kg IV every 12 hours (adjusted for CrCl).  Pricilla Larsson, [facility] 08/20/2015 8:49 PM

## 2015-08-20 NOTE — ED Notes (Addendum)
MD Sabra Heck doing lumbar puncture.   Time out made.

## 2015-08-20 NOTE — Progress Notes (Signed)
Pharmacy Note:  Initial antibiotics for Acyclovir ordered by EDP for Lumbar Puncture / HSV encephalitis?  CrCl cannot be calculated (Unknown ideal weight.).  BMET    Component Value Date/Time   NA 133* 08/20/2015 1750   K 3.5 08/20/2015 1750   CL 99* 08/20/2015 1750   CO2 14* 08/20/2015 1750   GLUCOSE 404* 08/20/2015 1750   BUN 17 08/20/2015 1750   CREATININE 1.39* 08/20/2015 1750   CALCIUM 8.3* 08/20/2015 1750   GFRNONAA 40* 08/20/2015 1750   GFRAA 47* 08/20/2015 1750      Allergies  Allergen Reactions  . Demerol Other (See Comments)    hallucinations  . Dilaudid [Hydromorphone Hcl] Nausea And Vomiting    Filed Vitals:   08/20/15 1815 08/20/15 1830  BP:  110/96  Pulse: 102 85  Resp: 18 27    Anti-infectives    Start     Dose/Rate Route Frequency Ordered Stop   08/20/15 1900  vancomycin (VANCOCIN) IVPB 1000 mg/200 mL premix     1,000 mg 200 mL/hr over 60 Minutes Intravenous  Once 08/20/15 1857     08/20/15 1900  piperacillin-tazobactam (ZOSYN) IVPB 3.375 g     3.375 g 100 mL/hr over 30 Minutes Intravenous  Once 08/20/15 1857        Plan: Initial doses of Acyclovir 10mg /kg X 1 ordered. F/U admission orders for further dosing if therapy continued.  Pricilla Larsson, [facility] 08/20/2015 7:15 PM

## 2015-08-20 NOTE — ED Notes (Signed)
Lab at bedside for lab draw.

## 2015-08-20 NOTE — ED Notes (Signed)
MD at bedside. 

## 2015-08-20 NOTE — ED Provider Notes (Signed)
CSN: [id number]     Arrival date & time 08/20/15  1741 History   First MD Initiated Contact with Patient 08/20/15 1750     Chief Complaint  Patient presents with  . Seizures     (Consider location/radiation/quality/duration/timing/severity/associated sxs/prior Treatment) HPI  The pt is a 60 y/o female who has known COPD as well as DM - she has a single kidney (donor) and she has never had seizures or stroke or MI - she presents after waking up this morning feeling chills - she was not well, she was nauseated and she had spent the majority of the day under an electric blanket feeling cold (husband is historian).  She had some clenching of the jaw at home and he tried to feed her - she would chew and drink but would not talk to him and became more obtunded eventually having a short lasted seizure that the husband describes as shaking all over - eyes rolled back - he tried to get her out of a chair and she fell forward strking her head on a side table AFTER the first seizure - she then had another seizure and then EMS saw another - confirmed tonic clonic activity - the pt had been complaining of dysuria earlier as well.  The pt is seizing on my initial evaluation.  Past Medical History  Diagnosis Date  . COPD (chronic obstructive pulmonary disease) ([place])   . Diabetes mellitus   . UTI (lower urinary tract infection)    Past Surgical History  Procedure Laterality Date  . Abdominal hysterectomy    . Nephrectomy    . Cholecystectomy    . Breast surgery    . Colonoscopy with esophagogastroduodenoscopy (egd) N/A 03/04/2013    Procedure: COLONOSCOPY WITH ESOPHAGOGASTRODUODENOSCOPY (EGD);  Surgeon: Rogene Houston, MD;  Location: [facility];  Service: Endoscopy;  Laterality: N/A;  1200-moved to [place] notified pt   Family History  Problem Relation Age of Onset  . Colon cancer Neg Hx   . CAD      family history  . Diabetes Father   . Diabetes Brother    Social History  Substance Use  Topics  . Smoking status: Former Smoker -- 1.00 packs/day for 26 years    Types: Cigarettes  . Smokeless tobacco: Former Systems developer    Quit date: 05/15/2012  . Alcohol Use: No   OB History    No data available     Review of Systems  Unable to perform ROS: Acuity of condition      Allergies  Demerol and Dilaudid  Home Medications   Prior to Admission medications   Medication Sig Start Date End Date Taking? Authorizing Provider  acetaminophen (TYLENOL) 500 MG tablet Take 1,000 mg by mouth every 6 (six) hours as needed for mild pain or moderate pain.   Yes Historical Provider, MD  ALPRAZolam Duanne Moron) 1 MG tablet Take 1 mg by mouth 3 (three) times daily as needed. For anxiety   Yes Historical Provider, MD  aspirin EC 81 MG tablet Take 81 mg by mouth daily.   Yes Historical Provider, MD  cyclobenzaprine (FLEXERIL) 10 MG tablet Take 1 tablet (10 mg total) by mouth 3 (three) times daily as needed for muscle spasms. 05/31/14  Yes Orpah Greek, MD  insulin glargine (LANTUS) 100 UNIT/ML injection Inject 22 Units into the skin every morning.    Yes Historical Provider, MD  NOVOLOG FLEXPEN 100 UNIT/ML FlexPen Inject 2-14 Units into the skin 3 (three) times  daily before meals.  04/27/14  Yes Historical Provider, MD  RESTASIS 0.05 % ophthalmic emulsion Apply 1 drop to eye 2 (two) times daily. 04/29/14  Yes Historical Provider, MD  sertraline (ZOLOFT) 50 MG tablet TK 1 T PO QD 08/14/15  Yes Historical Provider, MD  cephALEXin (KEFLEX) 500 MG capsule Take 1 capsule (500 mg total) by mouth 3 (three) times daily. Patient not taking: Reported on 08/20/2015 05/21/15   Virgel Manifold, MD   BP 101/54 mmHg  Pulse 67  Resp 20  Wt 107 lb (48.535 kg)  SpO2 98% Physical Exam  Constitutional: She appears well-developed and well-nourished. She appears distressed.  HENT:  Head: Normocephalic and atraumatic.  Mouth/Throat: Oropharynx is clear and moist. No oropharyngeal exudate.  Obtunded, distressed,  contusion to mid forehead, MMM, small amount of tongue biting / bleeding  Eyes: Conjunctivae and EOM are normal. Pupils are equal, round, and reactive to light. Right eye exhibits no discharge. Left eye exhibits no discharge. No scleral icterus.  Eyes deviated R when seizing, normal pupils, symmetrical  Neck: Normal range of motion. Neck supple. No JVD present. No thyromegaly present.  Cardiovascular: Regular rhythm, normal heart sounds and intact distal pulses.  Exam reveals no gallop and no friction rub.   No murmur heard. tachyc to 100  Pulmonary/Chest: Effort normal and breath sounds normal. No respiratory distress. She has no wheezes. She has no rales.  Snoring respirations, no gag  Abdominal: Soft. Bowel sounds are normal. She exhibits no distension and no mass. There is no tenderness.  Musculoskeletal: Normal range of motion. She exhibits no edema or tenderness.  Lymphadenopathy:    She has no cervical adenopathy.  Neurological:  Obtunded, GCS of 3  Skin: Skin is warm and dry. No rash noted. No erythema.  Psychiatric: She has a normal mood and affect. Her behavior is normal.  Nursing note and vitals reviewed.   ED Course  .Intubation Performed by: Noemi Chapel Authorized by: Noemi Chapel Consent: The procedure was performed in an emergent situation. Verbal consent obtained. Risks and benefits: risks, benefits and alternatives were discussed Consent given by: spouse Patient understanding: patient states understanding of the procedure being performed Required items: required blood products, implants, devices, and special equipment available Patient identity confirmed: hospital-assigned identification number Time out: Immediately prior to procedure a "time out" was called to verify the correct patient, procedure, equipment, support staff and site/side marked as required. Indications: respiratory failure and  airway protection Intubation method: direct Patient status: paralyzed  (RSI) Pretreatment medications: none Sedatives: etomidate Paralytic: rocuronium Laryngoscope size: Mac 4 Tube size: 7.5 mm Tube type: cuffed Number of attempts: 1 Cricoid pressure: no Cords visualized: yes Post-procedure assessment: chest rise and CO2 detector Breath sounds: equal and absent over the epigastrium Cuff inflated: yes ETT to lip: 22 cm Tube secured with: ETT holder Chest x-ray interpreted by me. Chest x-ray findings: endotracheal tube in appropriate position Patient tolerance: Patient tolerated the procedure well with no immediate complications  OG placement Performed by: Noemi Chapel Authorized by: Noemi Chapel Consent: The procedure was performed in an emergent situation. Required items: required blood products, implants, devices, and special equipment available Patient identity confirmed: arm band Time out: Immediately prior to procedure a "time out" was called to verify the correct patient, procedure, equipment, support staff and site/side marked as required. Local anesthesia used: no Patient sedated: yes Sedatives: propofol Vitals: Vital signs were monitored during sedation. Patient tolerance: Patient tolerated the procedure well with no immediate complications Comments: Xray  confirmed placement  .Lumbar Puncture Performed by: Noemi Chapel Authorized by: Noemi Chapel Consent: The procedure was performed in an emergent situation. Risks and benefits: risks, benefits and alternatives were discussed Consent given by: spouse Site marked: the operative site was marked Imaging studies: imaging studies available Required items: required blood products, implants, devices, and special equipment available Patient identity confirmed: arm band Time out: Immediately prior to procedure a "time out" was called to verify the correct patient, procedure, equipment, support staff and site/side marked as required. Indications: evaluation for infection, evaluation for  altered mental status and evaluation for subarachnoid hemorrhage Patient sedated: yes Sedatives: propofol Vitals: Vital signs were monitored during sedation. Preparation: Patient was prepped and draped in the usual sterile fashion. Lumbar space: L4-L5 interspace Patient's position: left lateral decubitus Needle gauge: 22 Needle type: spinal needle - Quincke tip Needle length: 3.5 in Number of attempts: 2 Fluid appearance: blood-tinged then clearing Tubes of fluid: 4 Total volume: 4 ml Post-procedure: site cleaned and adhesive bandage applied Patient tolerance: Patient tolerated the procedure well with no immediate complications   (including critical care time) Labs Review Labs Reviewed  COMPREHENSIVE METABOLIC PANEL - Abnormal; Notable for the following:    Sodium 133 (*)    Chloride 99 (*)    CO2 14 (*)    Glucose, Bld 404 (*)    Creatinine, Ser 1.39 (*)    Calcium 8.3 (*)    Alkaline Phosphatase 32 (*)    GFR calc non Af Amer 40 (*)    GFR calc Af Amer 47 (*)    Anion gap 20 (*)    All other components within normal limits  CBC WITH DIFFERENTIAL/PLATELET - Abnormal; Notable for the following:    WBC 14.5 (*)    Neutro Abs 10.0 (*)    All other components within normal limits  URINALYSIS, ROUTINE W REFLEX MICROSCOPIC (NOT AT [facility]) - Abnormal; Notable for the following:    Glucose, UA 500 (*)    Hgb urine dipstick SMALL (*)    All other components within normal limits  TROPONIN I - Abnormal; Notable for the following:    Troponin I 0.06 (*)    All other components within normal limits  PROTIME-INR - Abnormal; Notable for the following:    Prothrombin Time 15.4 (*)    All other components within normal limits  URINE MICROSCOPIC-ADD ON - Abnormal; Notable for the following:    Squamous Epithelial / LPF 0-5 (*)    Bacteria, UA RARE (*)    All other components within normal limits  I-STAT CG4 LACTIC ACID, ED - Abnormal; Notable for the following:    Lactic Acid, Venous  15.24 (*)    All other components within normal limits  CULTURE, BLOOD (ROUTINE X 2)  CULTURE, BLOOD (ROUTINE X 2)  URINE CULTURE  CSF CULTURE  GRAM STAIN  HSV CULTURE AND TYPING  CK  PROTEIN AND GLUCOSE, CSF  GLUCOSE, CSF  PROTEIN, CSF  CSF CELL COUNT WITH DIFFERENTIAL  CSF CELL COUNT WITH DIFFERENTIAL    Imaging Review Ct Head Wo Contrast  08/20/2015  CLINICAL DATA:  Seizure today. EXAM: CT HEAD WITHOUT CONTRAST TECHNIQUE: Contiguous axial images were obtained from the base of the skull through the vertex without intravenous contrast. COMPARISON:  05/31/2014 FINDINGS: A small left frontal scalp hematoma is noted but no underlying skull fracture. The ventricles are in the midline without mass effect or shift. They are normal in size and configuration. No extra-axial fluid collections are  identified. No findings for hemispheric infarction an or intracranial hemorrhage. No mass lesion. The brainstem and cerebellum are grossly normal. IMPRESSION: No acute intracranial findings or skull fracture. No change since prior study. Electronically Signed   By: Marijo Sanes M.D.   On: 08/20/2015 18:42   Dg Chest Port 1 View  08/20/2015  CLINICAL DATA:  Check endotracheal tube placement EXAM: PORTABLE CHEST 1 VIEW COMPARISON:  05/21/2015 FINDINGS: Cardiac shadow is stable. An endotracheal tube is now seen 5.8 cm above the carina. A nasogastric catheter is seen within the stomach. The lungs are well-aerated without focal infiltrate or sizable effusion. No acute bony abnormality is seen. IMPRESSION: Tubes and lines in satisfactory position. No acute abnormality noted. Electronically Signed   By: Inez Catalina M.D.   On: 08/20/2015 18:18   I have personally reviewed and evaluated these images and lab results as part of my medical decision-making.   EKG Interpretation None      MDM   Final diagnoses:  Status epilepticus ([place])      Pt had GCS of 3 - needing intubation - status epilepticus.  The  patient was critically ill, continued to have seizure-like activity despite getting 4 mg of Versed prehospital. The patient had recurrent seizures for both paramedics as well as 4 nursing staff and myself. I witnessed multiple episodes of tonic-clonic activity. The patient never returned to baseline. She appeared obtunded, she had no posturing, she did have a borderline fever. There was no other abnormal findings on exam other than the slight contusion to the head which the husband states occurred between seizures when he was trying to move her from the chair to the bed. The patient's vital signs were abnormal with a low-grade fever, she had no tachycardia and less she was having seizure-like activity, her blood pressure remained in normal range, she oxygenated well other than during seizure activity which required respiratory assistance.  Due to her low GCS and her ongoing altered mental status with ongoing seizures the decision was made to intubate the patient for airway protection and oxygenation. This went well without any complications. Postintubation chest x-ray was unremarkable.  Lumbar puncture was performed as the patient had a CT scan that was not remarkable, there was no answer for the patient's seizures.  Keppra was started immediately, acyclovir and bacterial antibiotics after cultures were obtained, propofol was started for sedation, the patient has done well with improvement in her vital signs and no further seizure activity. I discussed these findings with the patient as well as with the hospitalist, Dr. Maudie Mercury who will place the patient in the intensive care unit overnight. The patient is critically ill  Meds given in ED:  Medications  LORazepam (ATIVAN) injection (1 mg Intravenous Given 08/20/15 1804)  etomidate (AMIDATE) injection (10 mg Intravenous Given 08/20/15 1805)  rocuronium (ZEMURON) injection (50 mg Intravenous Given 08/20/15 1806)  levETIRAcetam (KEPPRA) 1000 MG/100ML IVPB (not  administered)  vancomycin (VANCOCIN) IVPB 1000 mg/200 mL premix (1,000 mg Intravenous New Bag/Given 08/20/15 1942)  piperacillin-tazobactam (ZOSYN) IVPB 3.375 g (3.375 g Intravenous New Bag/Given 08/20/15 1925)  acyclovir (ZOVIRAX) 485 mg in dextrose 5 % 100 mL IVPB (not administered)  LORazepam (ATIVAN) injection 1 mg (1 mg Intravenous Given 08/20/15 1743)  sodium chloride 0.9 % bolus 1,000 mL (1,000 mLs Intravenous New Bag/Given 08/20/15 1835)    And  sodium chloride 0.9 % bolus 500 mL (0 mLs Intravenous Stopped 08/20/15 1942)  levETIRAcetam (KEPPRA) 500 mg in sodium chloride 0.9 % 100  mL IVPB (0 mg Intravenous Stopped 08/20/15 1851)  LORazepam (ATIVAN) injection 1 mg (1 mg Intravenous Given 08/20/15 1811)  propofol (DIPRIVAN) 1000 MG/100ML infusion (55 mcg/kg/min  48.5 kg Intravenous Rate/Dose Change 08/20/15 1942)    CRITICAL CARE Performed by: Johnna Acosta Total critical care time: 35 minutes Critical care time was exclusive of separately billable procedures and treating other patients. Critical care was necessary to treat or prevent imminent or life-threatening deterioration. Critical care was time spent personally by me on the following activities: development of treatment plan with patient and/or surrogate as well as nursing, discussions with consultants, evaluation of patient's response to treatment, examination of patient, obtaining history from patient or surrogate, ordering and performing treatments and interventions, ordering and review of laboratory studies, ordering and review of radiographic studies, pulse oximetry and re-evaluation of patient's condition.   Noemi Chapel, MD 08/20/15 [phone]

## 2015-08-20 NOTE — ED Notes (Signed)
Pt have seizure upon switching rooms. Pt placed on side. MD made aware

## 2015-08-20 NOTE — ED Notes (Signed)
Report attempted x2

## 2015-08-21 ENCOUNTER — Inpatient Hospital Stay (HOSPITAL_COMMUNITY)
Admit: 2015-08-21 | Discharge: 2015-08-21 | Disposition: A | Discharge status: Home / Self Care | Payer: Medicaid Other | Attending: Neurology | Admitting: Neurology

## 2015-08-21 ENCOUNTER — Inpatient Hospital Stay (HOSPITAL_COMMUNITY): Payer: Medicaid Other

## 2015-08-21 DIAGNOSIS — J80 Acute respiratory distress syndrome: Secondary | ICD-10-CM

## 2015-08-21 LAB — OCCULT BLOOD GASTRIC / DUODENUM (SPECIMEN CUP)
OCCULT BLOOD, GASTRIC: POSITIVE — AB
PH, GASTRIC: 3

## 2015-08-21 LAB — BASIC METABOLIC PANEL
ANION GAP: 7 (ref 5–15)
ANION GAP: 7 (ref 5–15)
ANION GAP: 5 (ref 5–15)
ANION GAP: 6 (ref 5–15)
ANION GAP: 6 (ref 5–15)
ANION GAP: 6 (ref 5–15)
BUN: 16 mg/dL (ref 6–20)
BUN: 14 mg/dL (ref 6–20)
BUN: 13 mg/dL (ref 6–20)
BUN: 11 mg/dL (ref 6–20)
BUN: 10 mg/dL (ref 6–20)
BUN: 10 mg/dL (ref 6–20)
CO2: 24 mmol/L (ref 22–32)
CO2: 23 mmol/L (ref 22–32)
CO2: 24 mmol/L (ref 22–32)
CO2: 23 mmol/L (ref 22–32)
CO2: 22 mmol/L (ref 22–32)
CO2: 20 mmol/L — AB (ref 22–32)
CREATININE: 0.91 mg/dL (ref 0.44–1.00)
Calcium: 8 mg/dL — ABNORMAL LOW (ref 8.9–10.3)
Calcium: 8.3 mg/dL — ABNORMAL LOW (ref 8.9–10.3)
Calcium: 8.4 mg/dL — ABNORMAL LOW (ref 8.9–10.3)
Calcium: 8.2 mg/dL — ABNORMAL LOW (ref 8.9–10.3)
Calcium: 8.5 mg/dL — ABNORMAL LOW (ref 8.9–10.3)
Calcium: 8.4 mg/dL — ABNORMAL LOW (ref 8.9–10.3)
Chloride: 102 mmol/L (ref 101–111)
Chloride: 107 mmol/L (ref 101–111)
Chloride: 108 mmol/L (ref 101–111)
Chloride: 107 mmol/L (ref 101–111)
Chloride: 106 mmol/L (ref 101–111)
Chloride: 107 mmol/L (ref 101–111)
Creatinine, Ser: 1.17 mg/dL — ABNORMAL HIGH (ref 0.44–1.00)
Creatinine, Ser: 1.08 mg/dL — ABNORMAL HIGH (ref 0.44–1.00)
Creatinine, Ser: 0.95 mg/dL (ref 0.44–1.00)
Creatinine, Ser: 0.95 mg/dL (ref 0.44–1.00)
Creatinine, Ser: 0.93 mg/dL (ref 0.44–1.00)
GFR calc Af Amer: 57 mL/min — ABNORMAL LOW (ref >60)
GFR calc Af Amer: >60 mL/min (ref >60)
GFR calc Af Amer: >60 mL/min (ref >60)
GFR calc Af Amer: >60 mL/min (ref >60)
GFR calc Af Amer: >60 mL/min (ref >60)
GFR calc non Af Amer: 50 mL/min — ABNORMAL LOW (ref >60)
GFR calc non Af Amer: 55 mL/min — ABNORMAL LOW (ref >60)
GFR calc non Af Amer: >60 mL/min (ref >60)
GFR calc non Af Amer: >60 mL/min (ref >60)
GFR calc non Af Amer: >60 mL/min (ref >60)
GLUCOSE: 352 mg/dL — AB (ref 65–99)
GLUCOSE: 95 mg/dL (ref 65–99)
GLUCOSE: 104 mg/dL — AB (ref 65–99)
GLUCOSE: 159 mg/dL — AB (ref 65–99)
GLUCOSE: 179 mg/dL — AB (ref 65–99)
Glucose, Bld: 192 mg/dL — ABNORMAL HIGH (ref 65–99)
POTASSIUM: 3.2 mmol/L — AB (ref 3.5–5.1)
POTASSIUM: 3.2 mmol/L — AB (ref 3.5–5.1)
POTASSIUM: 3 mmol/L — AB (ref 3.5–5.1)
POTASSIUM: 3.1 mmol/L — AB (ref 3.5–5.1)
POTASSIUM: 3.2 mmol/L — AB (ref 3.5–5.1)
Potassium: 3.7 mmol/L (ref 3.5–5.1)
SODIUM: 134 mmol/L — AB (ref 135–145)
Sodium: 133 mmol/L — ABNORMAL LOW (ref 135–145)
Sodium: 137 mmol/L (ref 135–145)
Sodium: 137 mmol/L (ref 135–145)
Sodium: 136 mmol/L (ref 135–145)
Sodium: 133 mmol/L — ABNORMAL LOW (ref 135–145)

## 2015-08-21 LAB — BLOOD GAS, ARTERIAL
Acid-Base Excess: 0.5 mmol/L (ref 0.0–2.0)
Bicarbonate: 24.8 mEq/L — ABNORMAL HIGH (ref 20.0–24.0)
Drawn by: 213101
FIO2: 40
LHR: 20 {breaths}/min
O2 SAT: 99 %
PATIENT TEMPERATURE: 37
PCO2 ART: 28.4 mmHg — AB (ref 35.0–45.0)
PEEP/CPAP: 5 cmH2O
PH ART: 7.506 — AB (ref 7.350–7.450)
PO2 ART: 151 mmHg — AB (ref 80.0–100.0)
TCO2: 16.6 mmol/L (ref 0–100)
VT: 500 mL

## 2015-08-21 LAB — CBC
HCT: 33.4 % — ABNORMAL LOW (ref 36.0–46.0)
HCT: 31.6 % — ABNORMAL LOW (ref 36.0–46.0)
HEMATOCRIT: 34.3 % — AB (ref 36.0–46.0)
HEMOGLOBIN: 11.2 g/dL — AB (ref 12.0–15.0)
HEMOGLOBIN: 11.6 g/dL — AB (ref 12.0–15.0)
Hemoglobin: 10.9 g/dL — ABNORMAL LOW (ref 12.0–15.0)
MCH: 30 pg (ref 26.0–34.0)
MCH: 30 pg (ref 26.0–34.0)
MCH: 30.4 pg (ref 26.0–34.0)
MCHC: 33.5 g/dL (ref 30.0–36.0)
MCHC: 33.8 g/dL (ref 30.0–36.0)
MCHC: 34.5 g/dL (ref 30.0–36.0)
MCV: 89.5 fL (ref 78.0–100.0)
MCV: 88.6 fL (ref 78.0–100.0)
MCV: 88 fL (ref 78.0–100.0)
PLATELETS: 130 10*3/uL — AB (ref 150–400)
Platelets: 159 10*3/uL (ref 150–400)
Platelets: 133 10*3/uL — ABNORMAL LOW (ref 150–400)
RBC: 3.73 MIL/uL — AB (ref 3.87–5.11)
RBC: 3.87 MIL/uL (ref 3.87–5.11)
RBC: 3.59 MIL/uL — ABNORMAL LOW (ref 3.87–5.11)
RDW: 13.2 % (ref 11.5–15.5)
RDW: 13.4 % (ref 11.5–15.5)
RDW: 13.5 % (ref 11.5–15.5)
WBC: 9.3 10*3/uL (ref 4.0–10.5)
WBC: 9.3 10*3/uL (ref 4.0–10.5)
WBC: 8.1 10*3/uL (ref 4.0–10.5)

## 2015-08-21 LAB — GLUCOSE, CAPILLARY
GLUCOSE-CAPILLARY: 379 mg/dL — AB (ref 65–99)
GLUCOSE-CAPILLARY: 206 mg/dL — AB (ref 65–99)
GLUCOSE-CAPILLARY: 107 mg/dL — AB (ref 65–99)
GLUCOSE-CAPILLARY: 201 mg/dL — AB (ref 65–99)
Glucose-Capillary: 157 mg/dL — ABNORMAL HIGH (ref 65–99)
Glucose-Capillary: 184 mg/dL — ABNORMAL HIGH (ref 65–99)
Glucose-Capillary: 400 mg/dL — ABNORMAL HIGH (ref 65–99)
Glucose-Capillary: 71 mg/dL (ref 65–99)
Glucose-Capillary: 87 mg/dL (ref 65–99)

## 2015-08-21 LAB — ECHOCARDIOGRAM COMPLETE
HEIGHTINCHES: 61 in
Weight: 1961.21 oz

## 2015-08-21 LAB — TROPONIN I
TROPONIN I: 0.39 ng/mL — AB (ref <0.031)
TROPONIN I: 0.34 ng/mL — AB (ref <0.031)
TROPONIN I: 0.53 ng/mL — AB (ref <0.031)
Troponin I: 0.52 ng/mL (ref <0.031)
Troponin I: 0.35 ng/mL — ABNORMAL HIGH (ref <0.031)

## 2015-08-21 LAB — GRAM STAIN

## 2015-08-21 LAB — D-DIMER, QUANTITATIVE: D-Dimer, Quant: 2.86 ug/mL-FEU — ABNORMAL HIGH (ref 0.00–0.50)

## 2015-08-21 LAB — LACTIC ACID, PLASMA
LACTIC ACID, VENOUS: 2 mmol/L (ref 0.5–2.0)
Lactic Acid, Venous: 2.7 mmol/L (ref 0.5–2.0)

## 2015-08-21 LAB — MRSA PCR SCREENING: MRSA by PCR: NEGATIVE

## 2015-08-21 MED ORDER — MIDAZOLAM HCL 10 MG/2ML IJ SOLN: 1.0000 mg | INTRAMUSCULAR | Status: DC | PRN

## 2015-08-21 MED ORDER — ACETAMINOPHEN 325 MG PO TABS
650.0000 mg | ORAL_TABLET | ORAL | Status: DC | PRN
Start: 2015-08-21 — End: 2015-08-25
  Administered 2015-08-22 – 2015-08-24 (×4): 650 mg via ORAL
  Filled 2015-08-21 (×4): qty 2

## 2015-08-21 MED ORDER — ANTISEPTIC ORAL RINSE SOLUTION (CORINZ)
7.0000 mL | Freq: Four times a day (QID) | OROMUCOSAL | Status: DC
  Administered 2015-08-22 – 2015-08-23 (×5): 7 mL via OROMUCOSAL

## 2015-08-21 MED ORDER — DOXYCYCLINE HYCLATE 100 MG IV SOLR
100.0000 mg | Freq: Two times a day (BID) | INTRAVENOUS | Status: DC
  Administered 2015-08-21: 100 mg via INTRAVENOUS
  Filled 2015-08-21 (×3): qty 100

## 2015-08-21 MED ORDER — SODIUM CHLORIDE 0.9 % IV SOLN: INTRAVENOUS | Status: DC

## 2015-08-21 MED ORDER — INSULIN ASPART 100 UNIT/ML ~~LOC~~ SOLN
0.0000 [IU] | SUBCUTANEOUS | Status: DC
  Administered 2015-08-21: 3 [IU] via SUBCUTANEOUS
  Administered 2015-08-21 – 2015-08-22 (×4): 2 [IU] via SUBCUTANEOUS
  Administered 2015-08-22 (×2): 3 [IU] via SUBCUTANEOUS
  Administered 2015-08-22: 1 [IU] via SUBCUTANEOUS
  Administered 2015-08-23 (×3): 7 [IU] via SUBCUTANEOUS
  Administered 2015-08-23: 9 [IU] via SUBCUTANEOUS
  Administered 2015-08-23: 7 [IU] via SUBCUTANEOUS
  Administered 2015-08-24: 2 [IU] via SUBCUTANEOUS
  Administered 2015-08-24: 7 [IU] via SUBCUTANEOUS

## 2015-08-21 MED ORDER — POTASSIUM CHLORIDE 10 MEQ/100ML IV SOLN
10.0000 meq | INTRAVENOUS | Status: AC
  Administered 2015-08-21 (×6): 10 meq via INTRAVENOUS
  Filled 2015-08-21 (×6): qty 100

## 2015-08-21 MED ORDER — LEVETIRACETAM IN NACL 500 MG/100ML IV SOLN
500.0000 mg | Freq: Two times a day (BID) | INTRAVENOUS | Status: DC
  Administered 2015-08-21 – 2015-08-23 (×6): 500 mg via INTRAVENOUS
  Filled 2015-08-21 (×7): qty 100

## 2015-08-21 MED ORDER — CHLORHEXIDINE GLUCONATE 0.12% ORAL RINSE (MEDLINE KIT)
15.0000 mL | Freq: Two times a day (BID) | OROMUCOSAL | Status: DC
  Administered 2015-08-21 – 2015-08-22 (×3): 15 mL via OROMUCOSAL

## 2015-08-21 MED ORDER — ACETAMINOPHEN 160 MG/5ML PO SOLN: 650.0000 mg | ORAL | Status: DC | PRN

## 2015-08-21 MED ORDER — DEXTROSE-NACL 5-0.45 % IV SOLN
INTRAVENOUS | Status: DC
  Administered 2015-08-21: 75 mL/h via INTRAVENOUS
  Administered 2015-08-22 – 2015-08-23 (×2): 1000 mL via INTRAVENOUS
  Administered 2015-08-24: 06:00:00 via INTRAVENOUS

## 2015-08-21 MED ORDER — FENTANYL CITRATE (PF) 100 MCG/2ML IJ SOLN
25.0000 ug | INTRAMUSCULAR | Status: DC | PRN
  Administered 2015-08-23: 25 ug via INTRAVENOUS
  Filled 2015-08-21: qty 2

## 2015-08-21 MED ORDER — INSULIN ASPART 100 UNIT/ML ~~LOC~~ SOLN: 0.0000 [IU] | Freq: Three times a day (TID) | SUBCUTANEOUS | Status: DC

## 2015-08-21 MED ORDER — ACETAMINOPHEN 650 MG RE SUPP
650.0000 mg | RECTAL | Status: DC | PRN
  Administered 2015-08-21 – 2015-08-22 (×2): 650 mg via RECTAL
  Filled 2015-08-21 (×2): qty 1

## 2015-08-21 MED ORDER — FAMOTIDINE IN NACL 20-0.9 MG/50ML-% IV SOLN
20.0000 mg | Freq: Two times a day (BID) | INTRAVENOUS | Status: DC
  Administered 2015-08-21 – 2015-08-23 (×6): 20 mg via INTRAVENOUS
  Filled 2015-08-21 (×7): qty 50

## 2015-08-21 NOTE — Progress Notes (Signed)
Inpatient Diabetes Program Recommendations  AACE/ADA: New Consensus Statement on Inpatient Glycemic Control (2015)  Target Ranges:  Prepandial:   less than 140 mg/dL      Peak postprandial:   less than 180 mg/dL (1-2 hours)      Critically ill patients:  140 - 180 mg/dL  Results for Veronica Moses, Veronica Moses (MRN [id number]) as of 08/21/2015 07:37  Ref. Range 08/20/2015 20:39 08/20/2015 23:06 08/21/2015 00:18 08/21/2015 01:27 08/21/2015 04:34 08/21/2015 06:18  Glucose-Capillary Latest Ref Range: 65-99 mg/dL 443 (H) 405 (H) 400 (H) 206 (H) 71 87    Review of Glycemic Control  Diabetes history: DM2 Outpatient Diabetes medications: Lantus 22 units QAM, Novolog 2-14 units TID with meals Current orders for Inpatient glycemic control: Novolog 0-9 units Q4H  Inpatient Diabetes Program Recommendations: Insulin - Basal: Patient was on an insulin drip which has been stopped and no SQ insulin has been given since insulin drip was stopped. If glucose becomes elevated above 180 mg/dl, may want to consider ordering Lantus 6 units Q24H (based on 55.6 kg x 0.1 units).  Thanks, Barnie Alderman, RN, MSN, CDE Diabetes Coordinator Inpatient Diabetes Program [phone] (Team Pager from [place] to [place]) [phone] ([facility] office) [phone] [facility] office) [phone] [facility] office)

## 2015-08-21 NOTE — Progress Notes (Signed)
CRITICAL VALUE ALERT  Critical value received:  Troponin 0.52  Date of notification:  08/21/2015  Time of notification:  0400  Critical value read back:Yes.    Nurse who received alert:  Miquel Dunn   MD notified (1st page):  [place]  Time of first page: 0400  MD notified (2nd page):  Time of second page:  Responding MD:  Time MD responded:

## 2015-08-21 NOTE — Progress Notes (Signed)
EEG Completed; Results Pending  

## 2015-08-21 NOTE — Progress Notes (Signed)
Subjective: She was admitted last night with status epilepticus elevated blood sugar. She had apparently been doing well at her usual state of health on 08/19/2015 but woke up on 08/20/2015 and said she didn't feel very well. Her symptoms were nonspecific. She did have some burning when she urinated. She felt like she had some fever. No cough no congestion no other symptoms. She then became lethargic. She then started having seizures and had multiple seizures and was brought to the emergency department. She was intubated in the ER. She was found to have an elevated blood sugar and was started on insulin drip. Lactate level was very high on admission but I think that's probably related to status epilepticus. She has not had any further seizures since she's been started on Keppra. No previous history of seizure disorder. CT did not show stroke. MRI has been ordered but cannot be done while she is on the ventilator. Her troponin level is slightly elevated. Her potassium level is low.  Objective: Vital signs in last 24 hours: Temp:  [97 F (36.1 C)-98 F (36.7 C)] 98 F (36.7 C) (06/06 0400) Pulse Rate:  [57-110] 64 (06/06 0700) Resp:  [11-27] 20 (06/06 0700) BP: (101-131)/(44-96) 123/53 mmHg (06/06 0700) SpO2:  [98 %-100 %] 99 % (06/06 0700) FiO2 (%):  [40 %] 40 % (06/06 0508) Weight:  [48.535 kg (107 lb)-55.9 kg (123 lb 3.8 oz)] 55.6 kg (122 lb 9.2 oz) (06/06 0500) Weight change:  Last BM Date: 08/21/15  Intake/Output from previous day: 06/05 0701 - 06/06 0700 In: 1660 [I.V.:1660] Out: 1275 [Urine:1275]  PHYSICAL EXAM General appearance: Intubated sedated on mechanical ventilation Resp: rhonchi bilaterally Cardio: regular rate and rhythm, S1, S2 normal, no murmur, click, rub or gallop GI: soft, non-tender; bowel sounds normal; no masses,  no organomegaly Extremities: extremities normal, atraumatic, no cyanosis or edema  Lab Results:  Results for orders placed or performed during the  hospital encounter of 08/20/15 (from the past 48 hour(s))  Comprehensive metabolic panel     Status: Abnormal   Collection Time: 08/20/15  5:50 PM  Result Value Ref Range   Sodium 133 (L) 135 - 145 mmol/L   Potassium 3.5 3.5 - 5.1 mmol/L   Chloride 99 (L) 101 - 111 mmol/L   CO2 14 (L) 22 - 32 mmol/L   Glucose, Bld 404 (H) 65 - 99 mg/dL   BUN 17 6 - 20 mg/dL   Creatinine, Ser 3.41 (H) 0.44 - 1.00 mg/dL   Calcium 8.3 (L) 8.9 - 10.3 mg/dL   Total Protein 6.8 6.5 - 8.1 g/dL   Albumin 3.8 3.5 - 5.0 g/dL   AST 34 15 - 41 U/L   ALT 21 14 - 54 U/L   Alkaline Phosphatase 32 (L) 38 - 126 U/L   Total Bilirubin 0.4 0.3 - 1.2 mg/dL   GFR calc non Af Amer 40 (L) >60 mL/min   GFR calc Af Amer 47 (L) >60 mL/min    Comment: (NOTE) The eGFR has been calculated using the CKD EPI equation. This calculation has not been validated in all clinical situations. eGFR's persistently <60 mL/min signify possible Chronic Kidney Disease.    Anion gap 20 (H) 5 - 15  CBC WITH DIFFERENTIAL     Status: Abnormal   Collection Time: 08/20/15  5:50 PM  Result Value Ref Range   WBC 14.5 (H) 4.0 - 10.5 K/uL   RBC 4.40 3.87 - 5.11 MIL/uL   Hemoglobin 13.1 12.0 - 15.0 g/dL  HCT 40.9 36.0 - 46.0 %   MCV 93.0 78.0 - 100.0 fL   MCH 29.8 26.0 - 34.0 pg   MCHC 32.0 30.0 - 36.0 g/dL   RDW 13.1 11.5 - 15.5 %   Platelets 271 150 - 400 K/uL   Neutrophils Relative % 70 %   Neutro Abs 10.0 (H) 1.7 - 7.7 K/uL   Lymphocytes Relative 26 %   Lymphs Abs 3.7 0.7 - 4.0 K/uL   Monocytes Relative 5 %   Monocytes Absolute 0.7 0.1 - 1.0 K/uL   Eosinophils Relative 0 %   Eosinophils Absolute 0.0 0.0 - 0.7 K/uL   Basophils Relative 0 %   Basophils Absolute 0.0 0.0 - 0.1 K/uL   WBC Morphology WHITE COUNT CONFIRMED ON SMEAR   Urinalysis, Routine w reflex microscopic (not at [facility])     Status: Abnormal   Collection Time: 08/20/15  5:50 PM  Result Value Ref Range   Color, Urine YELLOW YELLOW   APPearance CLEAR CLEAR   Specific  Gravity, Urine 1.015 1.005 - 1.030   pH 5.5 5.0 - 8.0   Glucose, UA 500 (A) NEGATIVE mg/dL   Hgb urine dipstick SMALL (A) NEGATIVE   Bilirubin Urine NEGATIVE NEGATIVE   Ketones, ur NEGATIVE NEGATIVE mg/dL   Protein, ur NEGATIVE NEGATIVE mg/dL   Nitrite NEGATIVE NEGATIVE   Leukocytes, UA NEGATIVE NEGATIVE  Troponin I     Status: Abnormal   Collection Time: 08/20/15  5:50 PM  Result Value Ref Range   Troponin I 0.06 (H) <0.031 ng/mL    Comment:        PERSISTENTLY INCREASED TROPONIN VALUES IN THE RANGE OF 0.04-0.49 ng/mL CAN BE SEEN IN:       -UNSTABLE ANGINA       -CONGESTIVE HEART FAILURE       -MYOCARDITIS       -CHEST TRAUMA       -ARRYHTHMIAS       -LATE PRESENTING MYOCARDIAL INFARCTION       -COPD   CLINICAL FOLLOW-UP RECOMMENDED.   Protime-INR     Status: Abnormal   Collection Time: 08/20/15  5:50 PM  Result Value Ref Range   Prothrombin Time 15.4 (H) 11.6 - 15.2 seconds   INR 1.21 0.00 - 1.49  CK     Status: None   Collection Time: 08/20/15  5:50 PM  Result Value Ref Range   Total CK 60 38 - 234 U/L  Urine microscopic-add on     Status: Abnormal   Collection Time: 08/20/15  5:50 PM  Result Value Ref Range   Squamous Epithelial / LPF 0-5 (A) NONE SEEN   WBC, UA 0-5 0 - 5 WBC/hpf   RBC / HPF 0-5 0 - 5 RBC/hpf   Bacteria, UA RARE (A) NONE SEEN  Urine rapid drug screen (hosp performed)     Status: Abnormal   Collection Time: 08/20/15  5:50 PM  Result Value Ref Range   Opiates NONE DETECTED NONE DETECTED   Cocaine NONE DETECTED NONE DETECTED   Benzodiazepines POSITIVE (A) NONE DETECTED   Amphetamines NONE DETECTED NONE DETECTED   Tetrahydrocannabinol NONE DETECTED NONE DETECTED   Barbiturates NONE DETECTED NONE DETECTED    Comment:        DRUG SCREEN FOR MEDICAL PURPOSES ONLY.  IF CONFIRMATION IS NEEDED FOR ANY PURPOSE, NOTIFY LAB WITHIN 5 DAYS.        LOWEST DETECTABLE LIMITS FOR URINE DRUG SCREEN Drug Class  Cutoff (ng/mL) Amphetamine       1000 Barbiturate      200 Benzodiazepine   101 Tricyclics       751 Opiates          300 Cocaine          300 THC              50   Triglycerides     Status: None   Collection Time: 08/20/15  5:50 PM  Result Value Ref Range   Triglycerides 124 <150 mg/dL  D-dimer, quantitative (not at [facility])     Status: Abnormal   Collection Time: 08/20/15  5:50 PM  Result Value Ref Range   D-Dimer, Quant 2.86 (H) 0.00 - 0.50 ug/mL-FEU    Comment: (NOTE) At the manufacturer cut-off of 0.50 ug/mL FEU, this assay has been documented to exclude PE with a sensitivity and negative predictive value of 97 to 99%.  At this time, this assay has not been approved by the FDA to exclude DVT/VTE. Results should be correlated with clinical presentation.   I-Stat CG4 Lactic Acid, ED  (not at  [facility])     Status: Abnormal   Collection Time: 08/20/15  6:01 PM  Result Value Ref Range   Lactic Acid, Venous 15.24 (HH) 0.5 - 2.0 mmol/L   Comment NOTIFIED PHYSICIAN   Gram stain     Status: None   Collection Time: 08/20/15  6:50 PM  Result Value Ref Range   Specimen Description BACK    Special Requests Normal    Gram Stain      CYTOSPIN SMEAR NO ORGANISMS SEEN WBC PRESENT,BOTH PMN AND MONONUCLEAR Performed at [facility]   Report Status 08/20/2015 FINAL   Glucose, CSF     Status: Abnormal   Collection Time: 08/20/15  6:50 PM  Result Value Ref Range   Glucose, CSF 139 (H) 40 - 70 mg/dL  Protein, CSF     Status: None   Collection Time: 08/20/15  6:50 PM  Result Value Ref Range   Total  Protein, CSF 37 15 - 45 mg/dL  CSF cell count with differential collection tube #: 1     Status: Abnormal   Collection Time: 08/20/15  6:50 PM  Result Value Ref Range   Tube # 1    Color, CSF COLORLESS COLORLESS   Appearance, CSF CLEAR CLEAR   Supernatant COLORLESS    RBC Count, CSF 1392 (H) 0 /cu mm   WBC, CSF 0 0 - 5 /cu mm   Segmented Neutrophils-CSF TOO FEW TO COUNT, SMEAR AVAILABLE FOR REVIEW 0 - 6 %   Lymphs,  CSF TOO FEW TO COUNT, SMEAR AVAILABLE FOR REVIEW 40 - 80 %   Monocyte-Macrophage-Spinal Fluid TOO FEW TO COUNT, SMEAR AVAILABLE FOR REVIEW 15 - 45 %   Eosinophils, CSF TOO FEW TO COUNT, SMEAR AVAILABLE FOR REVIEW 0 - 1 %   Other Cells, CSF RARE SEGMENTED NEUTROPHIL AND MONONUCLEAR CELLS   CSF cell count with differential collection tube #: 4     Status: Abnormal   Collection Time: 08/20/15  6:50 PM  Result Value Ref Range   Tube # 4    Color, CSF COLORLESS COLORLESS   Appearance, CSF CLEAR CLEAR   Supernatant COLORLESS    RBC Count, CSF 390 (H) 0 /cu mm   WBC, CSF 0 0 - 5 /cu mm   Segmented Neutrophils-CSF TOO FEW TO COUNT, SMEAR AVAILABLE FOR REVIEW 0 - 6 %  Lymphs, CSF TOO FEW TO COUNT, SMEAR AVAILABLE FOR REVIEW 40 - 80 %   Monocyte-Macrophage-Spinal Fluid TOO FEW TO COUNT, SMEAR AVAILABLE FOR REVIEW 15 - 45 %   Eosinophils, CSF TOO FEW TO COUNT, SMEAR AVAILABLE FOR REVIEW 0 - 1 %  Blood Culture (routine x 2)     Status: None (Preliminary result)   Collection Time: 08/20/15  7:15 PM  Result Value Ref Range   Specimen Description BLOOD RIGHT HAND    Special Requests BOTTLES DRAWN AEROBIC AND ANAEROBIC 6CC    Culture PENDING    Report Status PENDING   Blood Culture (routine x 2)     Status: None (Preliminary result)   Collection Time: 08/20/15  7:22 PM  Result Value Ref Range   Specimen Description BLOOD RIGHT HAND    Special Requests BOTTLES DRAWN AEROBIC AND ANAEROBIC 6CC    Culture PENDING    Report Status PENDING   CBG monitoring, ED     Status: Abnormal   Collection Time: 08/20/15  8:39 PM  Result Value Ref Range   Glucose-Capillary 443 (H) 65 - 99 mg/dL  MRSA PCR Screening     Status: None   Collection Time: 08/20/15  9:25 PM  Result Value Ref Range   MRSA by PCR NEGATIVE NEGATIVE    Comment:        The GeneXpert MRSA Assay (FDA approved for NASAL specimens only), is one component of a comprehensive MRSA colonization surveillance program. It is not intended to  diagnose MRSA infection nor to guide or monitor treatment for MRSA infections.   Glucose, capillary     Status: Abnormal   Collection Time: 08/20/15 11:06 PM  Result Value Ref Range   Glucose-Capillary 405 (H) 65 - 99 mg/dL   Comment 1 Notify RN   Basic metabolic panel     Status: Abnormal   Collection Time: 08/20/15 11:26 PM  Result Value Ref Range   Sodium 133 (L) 135 - 145 mmol/L   Potassium 3.2 (L) 3.5 - 5.1 mmol/L   Chloride 102 101 - 111 mmol/L   CO2 24 22 - 32 mmol/L   Glucose, Bld 352 (H) 65 - 99 mg/dL   BUN 16 6 - 20 mg/dL   Creatinine, Ser 2.54 (H) 0.44 - 1.00 mg/dL   Calcium 8.0 (L) 8.9 - 10.3 mg/dL   GFR calc non Af Amer 50 (L) >60 mL/min   GFR calc Af Amer 57 (L) >60 mL/min    Comment: (NOTE) The eGFR has been calculated using the CKD EPI equation. This calculation has not been validated in all clinical situations. eGFR's persistently <60 mL/min signify possible Chronic Kidney Disease.    Anion gap 7 5 - 15  Lactic acid, plasma     Status: None   Collection Time: 08/20/15 11:26 PM  Result Value Ref Range   Lactic Acid, Venous 2.0 0.5 - 2.0 mmol/L  Blood gas, arterial     Status: Abnormal   Collection Time: 08/20/15 11:50 PM  Result Value Ref Range   FIO2 40.00    Delivery systems VENTILATOR    Mode PRESSURE REGULATED VOLUME CONTROL    VT 500.0 mL   LHR 20.0 resp/min   Peep/cpap 5.0 cm H20   pH, Arterial 7.506 (H) 7.350 - 7.450   pCO2 arterial 28.4 (L) 35.0 - 45.0 mmHg   pO2, Arterial 151.0 (H) 80.0 - 100.0 mmHg   Bicarbonate 24.8 (H) 20.0 - 24.0 mEq/L   TCO2 16.6 0 - 100  mmol/L   Acid-Base Excess 0.5 0.0 - 2.0 mmol/L   O2 Saturation 99.0 %   Patient temperature 37.0    Collection site LEFT RADIAL    Drawn by [id number]    Sample type ARTERIAL    Allens test (pass/fail) PASS PASS  Glucose, capillary     Status: Abnormal   Collection Time: 08/21/15 12:18 AM  Result Value Ref Range   Glucose-Capillary 400 (H) 65 - 99 mg/dL   Comment 1 Notify RN    Glucose, capillary     Status: Abnormal   Collection Time: 08/21/15  1:27 AM  Result Value Ref Range   Glucose-Capillary 206 (H) 65 - 99 mg/dL   Comment 1 Notify RN   Basic metabolic panel     Status: Abnormal   Collection Time: 08/21/15  2:39 AM  Result Value Ref Range   Sodium 137 135 - 145 mmol/L   Potassium 3.2 (L) 3.5 - 5.1 mmol/L   Chloride 107 101 - 111 mmol/L   CO2 23 22 - 32 mmol/L   Glucose, Bld 95 65 - 99 mg/dL   BUN 14 6 - 20 mg/dL   Creatinine, Ser 1.08 (H) 0.44 - 1.00 mg/dL   Calcium 8.3 (L) 8.9 - 10.3 mg/dL   GFR calc non Af Amer 55 (L) >60 mL/min   GFR calc Af Amer >60 >60 mL/min    Comment: (NOTE) The eGFR has been calculated using the CKD EPI equation. This calculation has not been validated in all clinical situations. eGFR's persistently <60 mL/min signify possible Chronic Kidney Disease.    Anion gap 7 5 - 15  CBC     Status: Abnormal   Collection Time: 08/21/15  2:39 AM  Result Value Ref Range   WBC 9.3 4.0 - 10.5 K/uL   RBC 3.73 (L) 3.87 - 5.11 MIL/uL   Hemoglobin 11.2 (L) 12.0 - 15.0 g/dL   HCT 33.4 (L) 36.0 - 46.0 %   MCV 89.5 78.0 - 100.0 fL   MCH 30.0 26.0 - 34.0 pg   MCHC 33.5 30.0 - 36.0 g/dL   RDW 13.2 11.5 - 15.5 %   Platelets 159 150 - 400 K/uL  Lactic acid, plasma     Status: Abnormal   Collection Time: 08/21/15  2:39 AM  Result Value Ref Range   Lactic Acid, Venous 2.7 (HH) 0.5 - 2.0 mmol/L    Comment: CRITICAL RESULT CALLED TO, READ BACK BY AND VERIFIED WITH: MIZE J AT 0352 ON [id number] BY FORSYTH K   Troponin I (q 6hr x 3)     Status: Abnormal   Collection Time: 08/21/15  2:39 AM  Result Value Ref Range   Troponin I 0.52 (HH) <0.031 ng/mL    Comment:        POSSIBLE MYOCARDIAL ISCHEMIA. SERIAL TESTING RECOMMENDED. DELTA CHECK NOTED CRITICAL RESULT CALLED TO, READ BACK BY AND VERIFIED WITH: Bethann Humble AT 0356 ON [id number] BY FORSYTH K   Glucose, capillary     Status: None   Collection Time: 08/21/15  4:34 AM  Result Value Ref Range    Glucose-Capillary 71 65 - 99 mg/dL  Glucose, capillary     Status: None   Collection Time: 08/21/15  6:18 AM  Result Value Ref Range   Glucose-Capillary 87 65 - 99 mg/dL  Basic metabolic panel     Status: Abnormal   Collection Time: 08/21/15  7:31 AM  Result Value Ref Range   Sodium 137 135 - 145 mmol/L  Potassium 3.0 (L) 3.5 - 5.1 mmol/L   Chloride 108 101 - 111 mmol/L   CO2 24 22 - 32 mmol/L   Glucose, Bld 104 (H) 65 - 99 mg/dL   BUN 13 6 - 20 mg/dL   Creatinine, Ser 0.95 0.44 - 1.00 mg/dL   Calcium 8.4 (L) 8.9 - 10.3 mg/dL   GFR calc non Af Amer >60 >60 mL/min   GFR calc Af Amer >60 >60 mL/min    Comment: (NOTE) The eGFR has been calculated using the CKD EPI equation. This calculation has not been validated in all clinical situations. eGFR's persistently <60 mL/min signify possible Chronic Kidney Disease.    Anion gap 5 5 - 15  Troponin I (q 6hr x 3)     Status: Abnormal   Collection Time: 08/21/15  7:31 AM  Result Value Ref Range   Troponin I 0.39 (H) <0.031 ng/mL    Comment:        PERSISTENTLY INCREASED TROPONIN VALUES IN THE RANGE OF 0.04-0.49 ng/mL CAN BE SEEN IN:       -UNSTABLE ANGINA       -CONGESTIVE HEART FAILURE       -MYOCARDITIS       -CHEST TRAUMA       -ARRYHTHMIAS       -LATE PRESENTING MYOCARDIAL INFARCTION       -COPD   CLINICAL FOLLOW-UP RECOMMENDED.   Glucose, capillary     Status: Abnormal   Collection Time: 08/21/15  7:41 AM  Result Value Ref Range   Glucose-Capillary 107 (H) 65 - 99 mg/dL   Comment 1 Notify RN    Comment 2 Document in Chart     ABGS  Recent Labs  08/20/15 2350  PHART 7.506*  PO2ART 151.0*  TCO2 16.6  HCO3 24.8*   CULTURES Recent Results (from the past 240 hour(s))  Gram stain     Status: None   Collection Time: 08/20/15  6:50 PM  Result Value Ref Range Status   Specimen Description BACK  Final   Special Requests Normal  Final   Gram Stain   Final    CYTOSPIN SMEAR NO ORGANISMS SEEN WBC PRESENT,BOTH PMN  AND MONONUCLEAR Performed at [facility]   Report Status 08/20/2015 FINAL  Final  Blood Culture (routine x 2)     Status: None (Preliminary result)   Collection Time: 08/20/15  7:15 PM  Result Value Ref Range Status   Specimen Description BLOOD RIGHT HAND  Final   Special Requests BOTTLES DRAWN AEROBIC AND ANAEROBIC 6CC  Final   Culture PENDING  Incomplete   Report Status PENDING  Incomplete  Blood Culture (routine x 2)     Status: None (Preliminary result)   Collection Time: 08/20/15  7:22 PM  Result Value Ref Range Status   Specimen Description BLOOD RIGHT HAND  Final   Special Requests BOTTLES DRAWN AEROBIC AND ANAEROBIC 6CC  Final   Culture PENDING  Incomplete   Report Status PENDING  Incomplete  MRSA PCR Screening     Status: None   Collection Time: 08/20/15  9:25 PM  Result Value Ref Range Status   MRSA by PCR NEGATIVE NEGATIVE Final    Comment:        The GeneXpert MRSA Assay (FDA approved for NASAL specimens only), is one component of a comprehensive MRSA colonization surveillance program. It is not intended to diagnose MRSA infection nor to guide or monitor treatment for MRSA infections.    Studies/Results: Ct  Head Wo Contrast  08/20/2015  CLINICAL DATA:  Seizure today. EXAM: CT HEAD WITHOUT CONTRAST TECHNIQUE: Contiguous axial images were obtained from the base of the skull through the vertex without intravenous contrast. COMPARISON:  05/31/2014 FINDINGS: A small left frontal scalp hematoma is noted but no underlying skull fracture. The ventricles are in the midline without mass effect or shift. They are normal in size and configuration. No extra-axial fluid collections are identified. No findings for hemispheric infarction an or intracranial hemorrhage. No mass lesion. The brainstem and cerebellum are grossly normal. IMPRESSION: No acute intracranial findings or skull fracture. No change since prior study. Electronically Signed   By: Rudie Meyer M.D.   On:  08/20/2015 18:42   Dg Chest Port 1 View  08/20/2015  CLINICAL DATA:  Check endotracheal tube placement EXAM: PORTABLE CHEST 1 VIEW COMPARISON:  05/21/2015 FINDINGS: Cardiac shadow is stable. An endotracheal tube is now seen 5.8 cm above the carina. A nasogastric catheter is seen within the stomach. The lungs are well-aerated without focal infiltrate or sizable effusion. No acute bony abnormality is seen. IMPRESSION: Tubes and lines in satisfactory position. No acute abnormality noted. Electronically Signed   By: Alcide Clever M.D.   On: 08/20/2015 18:18    Medications:  Prior to Admission:  Prescriptions prior to admission  Medication Sig Dispense Refill Last Dose  . acetaminophen (TYLENOL) 500 MG tablet Take 1,000 mg by mouth every 6 (six) hours as needed for mild pain or moderate pain.   unknown  . ALPRAZolam (XANAX) 1 MG tablet Take 1 mg by mouth 3 (three) times daily as needed. For anxiety   08/20/2015 at Unknown time  . aspirin EC 81 MG tablet Take 81 mg by mouth daily.   08/20/2015 at Unknown time  . cyclobenzaprine (FLEXERIL) 10 MG tablet Take 1 tablet (10 mg total) by mouth 3 (three) times daily as needed for muscle spasms. 20 tablet 0 unknown  . insulin glargine (LANTUS) 100 UNIT/ML injection Inject 22 Units into the skin every morning.    08/20/2015 at Unknown time  . NOVOLOG FLEXPEN 100 UNIT/ML FlexPen Inject 2-14 Units into the skin 3 (three) times daily before meals.   12 08/20/2015 at Unknown time  . RESTASIS 0.05 % ophthalmic emulsion Apply 1 drop to eye 2 (two) times daily.  3 unknown at Unknown time  . sertraline (ZOLOFT) 50 MG tablet TK 1 T PO QD  12 08/19/2015 at Unknown time  . cephALEXin (KEFLEX) 500 MG capsule Take 1 capsule (500 mg total) by mouth 3 (three) times daily. (Patient not taking: Reported on 08/20/2015) 15 capsule 0    Scheduled: . acyclovir  10 mg/kg Intravenous Q12H  . aspirin  300 mg Rectal Daily  . heparin  5,000 Units Subcutaneous Q8H  . insulin aspart  0-9 Units  Subcutaneous Q4H  . levETIRAcetam  500 mg Intravenous Q12H  . potassium chloride  10 mEq Intravenous Q1 Hr x 6   Continuous: . dextrose 5 % and 0.45% NaCl 75 mL/hr at 08/21/15 0445  . propofol (DIPRIVAN) infusion 60 mcg/kg/min (08/21/15 0434)   FJF:JLSNRH chloride, etomidate, LORazepam, ondansetron (ZOFRAN) IV, rocuronium  Assesment:She was admitted with status epilepticus. She is intubated and on the ventilator. It's not really clear what happened. She is being treated for herpetic encephalitis. She did not have any tick bites etc. but I'm going to go ahead and get laboratory work. She also had markedly elevated blood sugar. She has COPD at baseline but has done  very well with that in general Active Problems:   Diabetes ([place])   COPD (chronic obstructive pulmonary disease) ([facility])   Status epilepticus ([place])   Seizure ([place])   DKA (diabetic ketoacidoses) ([place])    Plan: Continue treatments. MRI when she can do it. EEG. Request neurology consultation    LOS: 1 day   Marsella Suman L 08/21/2015, 8:20 AM

## 2015-08-21 NOTE — Care Management Note (Signed)
Case Management Note  Patient Details  Name: Veronica Moses MRN: [id number] Date of Birth: 1955/04/23  Subjective/Objective: To room to speak with family at the bedside. Accompanied by Caryl Pina from [place]. Present in the room is patient sister Corporate treasurer).  Caryl Pina discussed Medicaid application and sister stated that patient had an appointment today to take bills to DSS to apply for Medicaid today but then became sick last night. Sister stated that the patient had comment about having enough $9000 in bills to qualify for the program.  Financial counselor took information and will continue to follow case.  Other family members came to room after counselor left. Husband [name])  Daughter  and son in Sports coach.   Family states that patient was doing OK at home and was not using any assisstive devices to ambulate. She was having trouble obtaining insulin as she is on Lantus and this was too expensive.  Discussed [employer] brand insulin with the family Novulin 70/30 available for 25 dollars. Family was not aware. Will need diabetic coordinator to follow prior to discharge. Discussed Case Management and my role as case manager in helping with discharge plans. Discussed what medicaid will and will not pay for like physical therapy.  Will continue to follow case for discharge needs. Will see how patient progresses.                 Action/Plan: Uninsured patient applying for Medicaid. Continue to follow for discharge plan.  Has family 24 hour support at home.   Expected Discharge Date:                  Expected Discharge Plan:     In-House Referral:  Financial Counselor  Discharge planning Services  Medication Assistance  Post Acute Care Choice:    Choice offered to:     DME Arranged:    DME Agency:     HH Arranged:    HH Agency:     Status of Service:  In process, will continue to follow  Medicare Important Message Given:    Date Medicare IM Given:    Medicare IM give by:    Date Additional Medicare IM  Given:    Additional Medicare Important Message give by:     If discussed at [place] of Stay Meetings, dates discussed:    Additional Comments:  Alvie Heidelberg, RN 08/21/2015, 11:21 AM

## 2015-08-21 NOTE — Consult Note (Signed)
[place] A. Merlene Laughter, MD     [email or web address]          Veronica Moses is an 60 y.o. female.   ASSESSMENT/PLAN:  Altered mental status due to status epilepticus and medication effect from sedatives. The etiology of the patient's status epilepticus is unclear at this time. The spinal fluid analysis is not supportive of central nervous system infectious processes. Imaging of the brain does not show acute intracranial processes. The patient was started on Zoloft for a few days ago and this may be the etiology.  RECOMMENDATION: EEG to be done as soon as possible.  I would discontinue antibiotics and antiviral agents given the CSF findings and increasing creatinine.  Agree with MRI of the brain.  Weaned the patient off the ventilator as soon as appropriate.  Discontinue all SSRI medications.   The patient is a 60 year old white female who presented to the hospital with multiple seizures. The family reports that she was not feeling well on the day of presentation. She felt fatigued and did have chills and fevers. She started having convulsions with the description the inconsistent although generalized tonic-clonic seizures with the stiffening and shaking on both sides. It appears she had more stiffening on the right side and her face was twisted towards the left. She fell and hit her head a couple times. And she did have some bleeding from her mouth. There is no previous history of seizures. There is no family history of seizures. No history of head injuries, strokes or central nervous system infections. The husband tells me that she was started on Zoloft a few days ago. No other in medications have been started recently. She is on alprazolam and has been taking this for many years but this was not stopped abruptly. Patient did have a CSF analysis which shows a pattern consistent with traumatic hemorrhage. Protein and WBC normal.  GENERAL: The patient is intubated and  heavily sedated on propofol.  HEENT: Supple. Atraumatic normocephalic.   ABDOMEN: soft  EXTREMITIES: No edema   BACK: Normal.  SKIN: Normal by inspection.    MENTAL STATUS: There is no eye openings. She does not follow commands.  CRANIAL NERVES: Pupils are small, equal, round and reactive to light; upper and lower facial muscles are normal in strength and symmetric, there is no flattening of the nasolabial folds; corneal reflexes are diminished and ocuclocephalic reflexes are intact  MOTOR: She withdraws to deep painful stimuli bilaterally.  COORDINATION: Left finger to nose is normal, right finger to nose is normal, No rest tremor; no intention tremor; no postural tremor; no bradykinesia.  REFLEXES: Deep tendon reflexes are symmetrical and normal. Babinski reflexes are flexor bilaterally.   SENSATION: She responds the pain. My bilaterally.     Blood pressure 123/53, pulse 64, temperature 98 F (36.7 C), temperature source Axillary, resp. rate 20, height '5\' 1"'$  (1.549 m), weight 122 lb 9.2 oz (55.6 kg), SpO2 99 %.  Past Medical History  Diagnosis Date  . COPD (chronic obstructive pulmonary disease) ([place])   . Diabetes mellitus   . UTI (lower urinary tract infection)     Past Surgical History  Procedure Laterality Date  . Abdominal hysterectomy    . Nephrectomy    . Cholecystectomy    . Breast surgery    . Colonoscopy with esophagogastroduodenoscopy (egd) N/A 03/04/2013    Procedure: COLONOSCOPY WITH ESOPHAGOGASTRODUODENOSCOPY (EGD);  Surgeon: Rogene Houston, MD;  Location: [facility];  Service: Endoscopy;  Laterality: N/A;  1200-moved to [place] notified pt    Family History  Problem Relation Age of Onset  . Colon cancer Neg Hx   . CAD      family history  . Diabetes Father   . Diabetes Brother     Social History:  reports that she has quit smoking. Her smoking use included Cigarettes. She has a 26 pack-year smoking history. She quit smokeless tobacco use  about 3 years ago. She reports that she does not drink alcohol or use illicit drugs.  Allergies:  Allergies  Allergen Reactions  . Demerol Other (See Comments)    hallucinations  . Dilaudid [Hydromorphone Hcl] Nausea And Vomiting    Medications: Prior to Admission medications   Medication Sig Start Date End Date Taking? Authorizing Provider  acetaminophen (TYLENOL) 500 MG tablet Take 1,000 mg by mouth every 6 (six) hours as needed for mild pain or moderate pain.   Yes Historical Provider, MD  ALPRAZolam Duanne Moron) 1 MG tablet Take 1 mg by mouth 3 (three) times daily as needed. For anxiety   Yes Historical Provider, MD  aspirin EC 81 MG tablet Take 81 mg by mouth daily.   Yes Historical Provider, MD  cyclobenzaprine (FLEXERIL) 10 MG tablet Take 1 tablet (10 mg total) by mouth 3 (three) times daily as needed for muscle spasms. 05/31/14  Yes Orpah Greek, MD  insulin glargine (LANTUS) 100 UNIT/ML injection Inject 22 Units into the skin every morning.    Yes Historical Provider, MD  NOVOLOG FLEXPEN 100 UNIT/ML FlexPen Inject 2-14 Units into the skin 3 (three) times daily before meals.  04/27/14  Yes Historical Provider, MD  RESTASIS 0.05 % ophthalmic emulsion Apply 1 drop to eye 2 (two) times daily. 04/29/14  Yes Historical Provider, MD  sertraline (ZOLOFT) 50 MG tablet TK 1 T PO QD 08/14/15  Yes Historical Provider, MD  cephALEXin (KEFLEX) 500 MG capsule Take 1 capsule (500 mg total) by mouth 3 (three) times daily. Patient not taking: Reported on 08/20/2015 05/21/15   Virgel Manifold, MD    Scheduled Meds: . acyclovir  10 mg/kg Intravenous Q12H  . aspirin  300 mg Rectal Daily  . heparin  5,000 Units Subcutaneous Q8H  . insulin aspart  0-9 Units Subcutaneous Q4H  . levETIRAcetam  500 mg Intravenous Q12H   Continuous Infusions: . dextrose 5 % and 0.45% NaCl 75 mL/hr at 08/21/15 0445  . propofol (DIPRIVAN) infusion 60 mcg/kg/min (08/21/15 0434)   PRN Meds:.sodium chloride, etomidate,  LORazepam, ondansetron (ZOFRAN) IV, rocuronium     Results for orders placed or performed during the hospital encounter of 08/20/15 (from the past 48 hour(s))  Comprehensive metabolic panel     Status: Abnormal   Collection Time: 08/20/15  5:50 PM  Result Value Ref Range   Sodium 133 (L) 135 - 145 mmol/L   Potassium 3.5 3.5 - 5.1 mmol/L   Chloride 99 (L) 101 - 111 mmol/L   CO2 14 (L) 22 - 32 mmol/L   Glucose, Bld 404 (H) 65 - 99 mg/dL   BUN 17 6 - 20 mg/dL   Creatinine, Ser 1.39 (H) 0.44 - 1.00 mg/dL   Calcium 8.3 (L) 8.9 - 10.3 mg/dL   Total Protein 6.8 6.5 - 8.1 g/dL   Albumin 3.8 3.5 - 5.0 g/dL   AST 34 15 - 41 U/L   ALT 21 14 - 54 U/L   Alkaline Phosphatase 32 (L) 38 - 126 U/L   Total Bilirubin 0.4 0.3 - 1.2  mg/dL   GFR calc non Af Amer 40 (L) >60 mL/min   GFR calc Af Amer 47 (L) >60 mL/min    Comment: (NOTE) The eGFR has been calculated using the CKD EPI equation. This calculation has not been validated in all clinical situations. eGFR's persistently <60 mL/min signify possible Chronic Kidney Disease.    Anion gap 20 (H) 5 - 15  CBC WITH DIFFERENTIAL     Status: Abnormal   Collection Time: 08/20/15  5:50 PM  Result Value Ref Range   WBC 14.5 (H) 4.0 - 10.5 K/uL   RBC 4.40 3.87 - 5.11 MIL/uL   Hemoglobin 13.1 12.0 - 15.0 g/dL   HCT 40.9 36.0 - 46.0 %   MCV 93.0 78.0 - 100.0 fL   MCH 29.8 26.0 - 34.0 pg   MCHC 32.0 30.0 - 36.0 g/dL   RDW 13.1 11.5 - 15.5 %   Platelets 271 150 - 400 K/uL   Neutrophils Relative % 70 %   Neutro Abs 10.0 (H) 1.7 - 7.7 K/uL   Lymphocytes Relative 26 %   Lymphs Abs 3.7 0.7 - 4.0 K/uL   Monocytes Relative 5 %   Monocytes Absolute 0.7 0.1 - 1.0 K/uL   Eosinophils Relative 0 %   Eosinophils Absolute 0.0 0.0 - 0.7 K/uL   Basophils Relative 0 %   Basophils Absolute 0.0 0.0 - 0.1 K/uL   WBC Morphology WHITE COUNT CONFIRMED ON SMEAR   Urinalysis, Routine w reflex microscopic (not at [facility])     Status: Abnormal   Collection Time: 08/20/15   5:50 PM  Result Value Ref Range   Color, Urine YELLOW YELLOW   APPearance CLEAR CLEAR   Specific Gravity, Urine 1.015 1.005 - 1.030   pH 5.5 5.0 - 8.0   Glucose, UA 500 (A) NEGATIVE mg/dL   Hgb urine dipstick SMALL (A) NEGATIVE   Bilirubin Urine NEGATIVE NEGATIVE   Ketones, ur NEGATIVE NEGATIVE mg/dL   Protein, ur NEGATIVE NEGATIVE mg/dL   Nitrite NEGATIVE NEGATIVE   Leukocytes, UA NEGATIVE NEGATIVE  Troponin I     Status: Abnormal   Collection Time: 08/20/15  5:50 PM  Result Value Ref Range   Troponin I 0.06 (H) <0.031 ng/mL    Comment:        PERSISTENTLY INCREASED TROPONIN VALUES IN THE RANGE OF 0.04-0.49 ng/mL CAN BE SEEN IN:       -UNSTABLE ANGINA       -CONGESTIVE HEART FAILURE       -MYOCARDITIS       -CHEST TRAUMA       -ARRYHTHMIAS       -LATE PRESENTING MYOCARDIAL INFARCTION       -COPD   CLINICAL FOLLOW-UP RECOMMENDED.   Protime-INR     Status: Abnormal   Collection Time: 08/20/15  5:50 PM  Result Value Ref Range   Prothrombin Time 15.4 (H) 11.6 - 15.2 seconds   INR 1.21 0.00 - 1.49  CK     Status: None   Collection Time: 08/20/15  5:50 PM  Result Value Ref Range   Total CK 60 38 - 234 U/L  Urine microscopic-add on     Status: Abnormal   Collection Time: 08/20/15  5:50 PM  Result Value Ref Range   Squamous Epithelial / LPF 0-5 (A) NONE SEEN   WBC, UA 0-5 0 - 5 WBC/hpf   RBC / HPF 0-5 0 - 5 RBC/hpf   Bacteria, UA RARE (A) NONE SEEN  Urine rapid  drug screen (hosp performed)     Status: Abnormal   Collection Time: 08/20/15  5:50 PM  Result Value Ref Range   Opiates NONE DETECTED NONE DETECTED   Cocaine NONE DETECTED NONE DETECTED   Benzodiazepines POSITIVE (A) NONE DETECTED   Amphetamines NONE DETECTED NONE DETECTED   Tetrahydrocannabinol NONE DETECTED NONE DETECTED   Barbiturates NONE DETECTED NONE DETECTED    Comment:        DRUG SCREEN FOR MEDICAL PURPOSES ONLY.  IF CONFIRMATION IS NEEDED FOR ANY PURPOSE, NOTIFY LAB WITHIN 5 DAYS.        LOWEST  DETECTABLE LIMITS FOR URINE DRUG SCREEN Drug Class       Cutoff (ng/mL) Amphetamine      1000 Barbiturate      200 Benzodiazepine   366 Tricyclics       440 Opiates          300 Cocaine          300 THC              50   Triglycerides     Status: None   Collection Time: 08/20/15  5:50 PM  Result Value Ref Range   Triglycerides 124 <150 mg/dL  D-dimer, quantitative (not at [facility])     Status: Abnormal   Collection Time: 08/20/15  5:50 PM  Result Value Ref Range   D-Dimer, Quant 2.86 (H) 0.00 - 0.50 ug/mL-FEU    Comment: (NOTE) At the manufacturer cut-off of 0.50 ug/mL FEU, this assay has been documented to exclude PE with a sensitivity and negative predictive value of 97 to 99%.  At this time, this assay has not been approved by the FDA to exclude DVT/VTE. Results should be correlated with clinical presentation.   I-Stat CG4 Lactic Acid, ED  (not at  [facility])     Status: Abnormal   Collection Time: 08/20/15  6:01 PM  Result Value Ref Range   Lactic Acid, Venous 15.24 (HH) 0.5 - 2.0 mmol/L   Comment NOTIFIED PHYSICIAN   Gram stain     Status: None   Collection Time: 08/20/15  6:50 PM  Result Value Ref Range   Specimen Description BACK    Special Requests Normal    Gram Stain      CYTOSPIN SMEAR NO ORGANISMS SEEN WBC PRESENT,BOTH PMN AND MONONUCLEAR Performed at [facility]   Report Status 08/20/2015 FINAL   Glucose, CSF     Status: Abnormal   Collection Time: 08/20/15  6:50 PM  Result Value Ref Range   Glucose, CSF 139 (H) 40 - 70 mg/dL  Protein, CSF     Status: None   Collection Time: 08/20/15  6:50 PM  Result Value Ref Range   Total  Protein, CSF 37 15 - 45 mg/dL  CSF cell count with differential collection tube #: 1     Status: Abnormal   Collection Time: 08/20/15  6:50 PM  Result Value Ref Range   Tube # 1    Color, CSF COLORLESS COLORLESS   Appearance, CSF CLEAR CLEAR   Supernatant COLORLESS    RBC Count, CSF 1392 (H) 0 /cu mm   WBC, CSF 0 0 - 5 /cu mm    Segmented Neutrophils-CSF TOO FEW TO COUNT, SMEAR AVAILABLE FOR REVIEW 0 - 6 %   Lymphs, CSF TOO FEW TO COUNT, SMEAR AVAILABLE FOR REVIEW 40 - 80 %   Monocyte-Macrophage-Spinal Fluid TOO FEW TO COUNT, SMEAR AVAILABLE FOR REVIEW 15 - 45 %  Eosinophils, CSF TOO FEW TO COUNT, SMEAR AVAILABLE FOR REVIEW 0 - 1 %   Other Cells, CSF RARE SEGMENTED NEUTROPHIL AND MONONUCLEAR CELLS   CSF cell count with differential collection tube #: 4     Status: Abnormal   Collection Time: 08/20/15  6:50 PM  Result Value Ref Range   Tube # 4    Color, CSF COLORLESS COLORLESS   Appearance, CSF CLEAR CLEAR   Supernatant COLORLESS    RBC Count, CSF 390 (H) 0 /cu mm   WBC, CSF 0 0 - 5 /cu mm   Segmented Neutrophils-CSF TOO FEW TO COUNT, SMEAR AVAILABLE FOR REVIEW 0 - 6 %   Lymphs, CSF TOO FEW TO COUNT, SMEAR AVAILABLE FOR REVIEW 40 - 80 %   Monocyte-Macrophage-Spinal Fluid TOO FEW TO COUNT, SMEAR AVAILABLE FOR REVIEW 15 - 45 %   Eosinophils, CSF TOO FEW TO COUNT, SMEAR AVAILABLE FOR REVIEW 0 - 1 %  Blood Culture (routine x 2)     Status: None (Preliminary result)   Collection Time: 08/20/15  7:15 PM  Result Value Ref Range   Specimen Description BLOOD RIGHT HAND    Special Requests BOTTLES DRAWN AEROBIC AND ANAEROBIC 6CC    Culture PENDING    Report Status PENDING   Blood Culture (routine x 2)     Status: None (Preliminary result)   Collection Time: 08/20/15  7:22 PM  Result Value Ref Range   Specimen Description BLOOD RIGHT HAND    Special Requests BOTTLES DRAWN AEROBIC AND ANAEROBIC 6CC    Culture PENDING    Report Status PENDING   CBG monitoring, ED     Status: Abnormal   Collection Time: 08/20/15  8:39 PM  Result Value Ref Range   Glucose-Capillary 443 (H) 65 - 99 mg/dL  MRSA PCR Screening     Status: None   Collection Time: 08/20/15  9:25 PM  Result Value Ref Range   MRSA by PCR NEGATIVE NEGATIVE    Comment:        The GeneXpert MRSA Assay (FDA approved for NASAL specimens only), is one  component of a comprehensive MRSA colonization surveillance program. It is not intended to diagnose MRSA infection nor to guide or monitor treatment for MRSA infections.   Glucose, capillary     Status: Abnormal   Collection Time: 08/20/15 11:06 PM  Result Value Ref Range   Glucose-Capillary 405 (H) 65 - 99 mg/dL   Comment 1 Notify RN   Basic metabolic panel     Status: Abnormal   Collection Time: 08/20/15 11:26 PM  Result Value Ref Range   Sodium 133 (L) 135 - 145 mmol/L   Potassium 3.2 (L) 3.5 - 5.1 mmol/L   Chloride 102 101 - 111 mmol/L   CO2 24 22 - 32 mmol/L   Glucose, Bld 352 (H) 65 - 99 mg/dL   BUN 16 6 - 20 mg/dL   Creatinine, Ser 1.17 (H) 0.44 - 1.00 mg/dL   Calcium 8.0 (L) 8.9 - 10.3 mg/dL   GFR calc non Af Amer 50 (L) >60 mL/min   GFR calc Af Amer 57 (L) >60 mL/min    Comment: (NOTE) The eGFR has been calculated using the CKD EPI equation. This calculation has not been validated in all clinical situations. eGFR's persistently <60 mL/min signify possible Chronic Kidney Disease.    Anion gap 7 5 - 15  Lactic acid, plasma     Status: None   Collection Time: 08/20/15 11:26 PM  Result Value Ref Range   Lactic Acid, Venous 2.0 0.5 - 2.0 mmol/L  Blood gas, arterial     Status: Abnormal   Collection Time: 08/20/15 11:50 PM  Result Value Ref Range   FIO2 40.00    Delivery systems VENTILATOR    Mode PRESSURE REGULATED VOLUME CONTROL    VT 500.0 mL   LHR 20.0 resp/min   Peep/cpap 5.0 cm H20   pH, Arterial 7.506 (H) 7.350 - 7.450   pCO2 arterial 28.4 (L) 35.0 - 45.0 mmHg   pO2, Arterial 151.0 (H) 80.0 - 100.0 mmHg   Bicarbonate 24.8 (H) 20.0 - 24.0 mEq/L   TCO2 16.6 0 - 100 mmol/L   Acid-Base Excess 0.5 0.0 - 2.0 mmol/L   O2 Saturation 99.0 %   Patient temperature 37.0    Collection site LEFT RADIAL    Drawn by [id number]    Sample type ARTERIAL    Allens test (pass/fail) PASS PASS  Glucose, capillary     Status: Abnormal   Collection Time: 08/21/15 12:18 AM    Result Value Ref Range   Glucose-Capillary 400 (H) 65 - 99 mg/dL   Comment 1 Notify RN   Glucose, capillary     Status: Abnormal   Collection Time: 08/21/15  1:27 AM  Result Value Ref Range   Glucose-Capillary 206 (H) 65 - 99 mg/dL   Comment 1 Notify RN   Basic metabolic panel     Status: Abnormal   Collection Time: 08/21/15  2:39 AM  Result Value Ref Range   Sodium 137 135 - 145 mmol/L   Potassium 3.2 (L) 3.5 - 5.1 mmol/L   Chloride 107 101 - 111 mmol/L   CO2 23 22 - 32 mmol/L   Glucose, Bld 95 65 - 99 mg/dL   BUN 14 6 - 20 mg/dL   Creatinine, Ser 1.08 (H) 0.44 - 1.00 mg/dL   Calcium 8.3 (L) 8.9 - 10.3 mg/dL   GFR calc non Af Amer 55 (L) >60 mL/min   GFR calc Af Amer >60 >60 mL/min    Comment: (NOTE) The eGFR has been calculated using the CKD EPI equation. This calculation has not been validated in all clinical situations. eGFR's persistently <60 mL/min signify possible Chronic Kidney Disease.    Anion gap 7 5 - 15  CBC     Status: Abnormal   Collection Time: 08/21/15  2:39 AM  Result Value Ref Range   WBC 9.3 4.0 - 10.5 K/uL   RBC 3.73 (L) 3.87 - 5.11 MIL/uL   Hemoglobin 11.2 (L) 12.0 - 15.0 g/dL   HCT 33.4 (L) 36.0 - 46.0 %   MCV 89.5 78.0 - 100.0 fL   MCH 30.0 26.0 - 34.0 pg   MCHC 33.5 30.0 - 36.0 g/dL   RDW 13.2 11.5 - 15.5 %   Platelets 159 150 - 400 K/uL  Lactic acid, plasma     Status: Abnormal   Collection Time: 08/21/15  2:39 AM  Result Value Ref Range   Lactic Acid, Venous 2.7 (HH) 0.5 - 2.0 mmol/L    Comment: CRITICAL RESULT CALLED TO, READ BACK BY AND VERIFIED WITH: MIZE J AT [id number] ON [id number] BY FORSYTH K   Troponin I (q 6hr x 3)     Status: Abnormal   Collection Time: 08/21/15  2:39 AM  Result Value Ref Range   Troponin I 0.52 (HH) <0.031 ng/mL    Comment:        POSSIBLE MYOCARDIAL ISCHEMIA. SERIAL TESTING  RECOMMENDED. DELTA CHECK NOTED CRITICAL RESULT CALLED TO, READ BACK BY AND VERIFIED WITH: Bethann Humble AT [id number] ON [id number] BY FORSYTH K    Glucose, capillary     Status: None   Collection Time: 08/21/15  4:34 AM  Result Value Ref Range   Glucose-Capillary 71 65 - 99 mg/dL  Glucose, capillary     Status: None   Collection Time: 08/21/15  6:18 AM  Result Value Ref Range   Glucose-Capillary 87 65 - 99 mg/dL    Studies/Results:  HEAD CT A small left frontal scalp hematoma is noted but no underlying skull fracture.  The ventricles are in the midline without mass effect or shift. They are normal in size and configuration. No extra-axial fluid collections are identified. No findings for hemispheric infarction an or intracranial hemorrhage. No mass lesion. The brainstem and cerebellum are grossly normal.  IMPRESSION: No acute intracranial findings or skull fracture. No change since prior study.    [place]oli Penny A. Merlene Laughter, M.D.  Diplomate, Tax adviser of Psychiatry and Neurology ( Neurology). 08/21/2015, 8:00 AM

## 2015-08-22 ENCOUNTER — Inpatient Hospital Stay (HOSPITAL_COMMUNITY): Payer: Medicaid Other

## 2015-08-22 LAB — BLOOD GAS, ARTERIAL
ACID-BASE EXCESS: 0.6 mmol/L (ref 0.0–2.0)
Acid-base deficit: 1.7 mmol/L (ref 0.0–2.0)
BICARBONATE: 25 meq/L — AB (ref 20.0–24.0)
Bicarbonate: 23.4 mEq/L (ref 20.0–24.0)
DRAWN BY: 234301
Drawn by: 213101
FIO2: 40
FIO2: 40
LHR: 20 {breaths}/min
MODE: POSITIVE
O2 SAT: 99.1 %
O2 Saturation: 99.2 %
PATIENT TEMPERATURE: 37
PATIENT TEMPERATURE: 37
PCO2 ART: 23.5 mmHg — AB (ref 35.0–45.0)
PCO2 ART: 34 mmHg — AB (ref 35.0–45.0)
PEEP/CPAP: 5 cmH2O
PEEP/CPAP: 5 cmH2O
PO2 ART: 177 mmHg — AB (ref 80.0–100.0)
PO2 ART: 194 mmHg — AB (ref 80.0–100.0)
Pressure support: 5 cmH2O
TCO2: 15.4 mmol/L (ref 0–100)
VT: 500 mL
pH, Arterial: 7.568 — ABNORMAL HIGH (ref 7.350–7.450)
pH, Arterial: 7.428 (ref 7.350–7.450)

## 2015-08-22 LAB — BASIC METABOLIC PANEL
ANION GAP: 5 (ref 5–15)
BUN: 10 mg/dL (ref 6–20)
CHLORIDE: 110 mmol/L (ref 101–111)
CO2: 23 mmol/L (ref 22–32)
Calcium: 8.4 mg/dL — ABNORMAL LOW (ref 8.9–10.3)
Creatinine, Ser: 1.03 mg/dL — ABNORMAL HIGH (ref 0.44–1.00)
GFR calc non Af Amer: 58 mL/min — ABNORMAL LOW (ref >60)
Glucose, Bld: 187 mg/dL — ABNORMAL HIGH (ref 65–99)
Potassium: 3.2 mmol/L — ABNORMAL LOW (ref 3.5–5.1)
SODIUM: 138 mmol/L (ref 135–145)

## 2015-08-22 LAB — GLUCOSE, CAPILLARY
GLUCOSE-CAPILLARY: 204 mg/dL — AB (ref 65–99)
GLUCOSE-CAPILLARY: 157 mg/dL — AB (ref 65–99)
Glucose-Capillary: 104 mg/dL — ABNORMAL HIGH (ref 65–99)
Glucose-Capillary: 121 mg/dL — ABNORMAL HIGH (ref 65–99)
Glucose-Capillary: 198 mg/dL — ABNORMAL HIGH (ref 65–99)
Glucose-Capillary: 214 mg/dL — ABNORMAL HIGH (ref 65–99)

## 2015-08-22 LAB — URINE CULTURE: Culture: NO GROWTH

## 2015-08-22 LAB — HEMOGLOBIN A1C
Hgb A1c MFr Bld: 5.3 % (ref 4.8–5.6)
Mean Plasma Glucose: 105 mg/dL

## 2015-08-22 LAB — B. BURGDORFI ANTIBODIES: B burgdorferi Ab IgG+IgM: <0.91 {ISR} (ref 0.00–0.90)

## 2015-08-22 MED ORDER — CYCLOSPORINE 0.05 % OP EMUL
1.0000 [drp] | Freq: Two times a day (BID) | OPHTHALMIC | Status: DC
  Administered 2015-08-23 – 2015-08-25 (×5): 1 [drp] via OPHTHALMIC
  Filled 2015-08-22 (×10): qty 1

## 2015-08-22 NOTE — Progress Notes (Signed)
[place]alaheo A. Merlene Laughter, MD     [email or web address]          Veronica Moses is an 60 y.o. female.   Assessment/Plan:  Altered mental status due to status epilepticus and medication effect from sedatives. The etiology of the patient's status epilepticus is unclear at this time. The spinal fluid analysis is not supportive of central nervous system infectious processes. Imaging of the brain does not show acute intracranial processes. The patient was started on Zoloft for a few days ago and this may be the etiology.  RECOMMENDATION: Agree with MRI of the brain.  Weaned the patient off the ventilator as soon as appropriate.  Discontinue all SSRI medications.   No recurrent seizures are reported. She seemed to be moving more. She is in the process of being weaned off the ventilator. Sedatives have recently been held.   GENERAL: The patient is intubated and heavily sedated on propofol.  HEENT: Supple. Atraumatic normocephalic.   ABDOMEN: soft  EXTREMITIES: No edema   BACK: Normal.  SKIN: Normal by inspection.   MENTAL STATUS: There is no eye openings. She does not follow commands. She still is intubated and quite drowsy. She does not open her eyes even to deep painful stimuli. However, she is moving all 4 extremities vigorously.   CRANIAL NERVES: Pupils are small, equal, round and reactive to light; upper and lower facial muscles are normal in strength and symmetric, there is no flattening of the nasolabial folds; corneal reflexes are diminished and ocuclocephalic reflexes are intact  MOTOR: She withdraws to deep painful stimuli bilaterally.  COORDINATION: Left finger to nose is normal, right finger to nose is normal, No rest tremor; no intention tremor; no postural tremor; no bradykinesia.        Objective: Vital signs in last 24 hours: Temp:  [98.2 F (36.8 C)-101 F (38.3 C)] 98.2 F (36.8 C) (06/07 0400) Pulse Rate:  [61-78] 65 (06/07  0500) Resp:  [10-20] 17 (06/07 0100) BP: (75-138)/(40-77) 75/40 mmHg (06/07 0500) SpO2:  [97 %-100 %] 98 % (06/07 0500) FiO2 (%):  [40 %] 40 % (06/07 0343) Weight:  [122 lb 9.2 oz (55.6 kg)] 122 lb 9.2 oz (55.6 kg) (06/07 0500)  Intake/Output from previous day: 06/06 0701 - 06/07 0700 In: 2389.5 [I.V.:1279.8; IV Piggyback:1109.7] Out: 2850 [Urine:1500] Intake/Output this shift:   Nutritional status:     Lab Results: Results for orders placed or performed during the hospital encounter of 08/20/15 (from the past 48 hour(s))  Glucose, capillary     Status: Abnormal   Collection Time: 08/20/15  5:38 PM  Result Value Ref Range   Glucose-Capillary 379 (H) 65 - 99 mg/dL  Comprehensive metabolic panel     Status: Abnormal   Collection Time: 08/20/15  5:50 PM  Result Value Ref Range   Sodium 133 (L) 135 - 145 mmol/L   Potassium 3.5 3.5 - 5.1 mmol/L   Chloride 99 (L) 101 - 111 mmol/L   CO2 14 (L) 22 - 32 mmol/L   Glucose, Bld 404 (H) 65 - 99 mg/dL   BUN 17 6 - 20 mg/dL   Creatinine, Ser 1.39 (H) 0.44 - 1.00 mg/dL   Calcium 8.3 (L) 8.9 - 10.3 mg/dL   Total Protein 6.8 6.5 - 8.1 g/dL   Albumin 3.8 3.5 - 5.0 g/dL   AST 34 15 - 41 U/L   ALT 21 14 - 54 U/L   Alkaline Phosphatase 32 (L) 38 - 126 U/L  Total Bilirubin 0.4 0.3 - 1.2 mg/dL   GFR calc non Af Amer 40 (L) >60 mL/min   GFR calc Af Amer 47 (L) >60 mL/min    Comment: (NOTE) The eGFR has been calculated using the CKD EPI equation. This calculation has not been validated in all clinical situations. eGFR's persistently <60 mL/min signify possible Chronic Kidney Disease.    Anion gap 20 (H) 5 - 15  CBC WITH DIFFERENTIAL     Status: Abnormal   Collection Time: 08/20/15  5:50 PM  Result Value Ref Range   WBC 14.5 (H) 4.0 - 10.5 K/uL   RBC 4.40 3.87 - 5.11 MIL/uL   Hemoglobin 13.1 12.0 - 15.0 g/dL   HCT 40.9 36.0 - 46.0 %   MCV 93.0 78.0 - 100.0 fL   MCH 29.8 26.0 - 34.0 pg   MCHC 32.0 30.0 - 36.0 g/dL   RDW 13.1 11.5 -  15.5 %   Platelets 271 150 - 400 K/uL   Neutrophils Relative % 70 %   Neutro Abs 10.0 (H) 1.7 - 7.7 K/uL   Lymphocytes Relative 26 %   Lymphs Abs 3.7 0.7 - 4.0 K/uL   Monocytes Relative 5 %   Monocytes Absolute 0.7 0.1 - 1.0 K/uL   Eosinophils Relative 0 %   Eosinophils Absolute 0.0 0.0 - 0.7 K/uL   Basophils Relative 0 %   Basophils Absolute 0.0 0.0 - 0.1 K/uL   WBC Morphology WHITE COUNT CONFIRMED ON SMEAR   Urinalysis, Routine w reflex microscopic (not at [facility])     Status: Abnormal   Collection Time: 08/20/15  5:50 PM  Result Value Ref Range   Color, Urine YELLOW YELLOW   APPearance CLEAR CLEAR   Specific Gravity, Urine 1.015 1.005 - 1.030   pH 5.5 5.0 - 8.0   Glucose, UA 500 (A) NEGATIVE mg/dL   Hgb urine dipstick SMALL (A) NEGATIVE   Bilirubin Urine NEGATIVE NEGATIVE   Ketones, ur NEGATIVE NEGATIVE mg/dL   Protein, ur NEGATIVE NEGATIVE mg/dL   Nitrite NEGATIVE NEGATIVE   Leukocytes, UA NEGATIVE NEGATIVE  Troponin I     Status: Abnormal   Collection Time: 08/20/15  5:50 PM  Result Value Ref Range   Troponin I 0.06 (H) <0.031 ng/mL    Comment:        PERSISTENTLY INCREASED TROPONIN VALUES IN THE RANGE OF 0.04-0.49 ng/mL CAN BE SEEN IN:       -UNSTABLE ANGINA       -CONGESTIVE HEART FAILURE       -MYOCARDITIS       -CHEST TRAUMA       -ARRYHTHMIAS       -LATE PRESENTING MYOCARDIAL INFARCTION       -COPD   CLINICAL FOLLOW-UP RECOMMENDED.   Protime-INR     Status: Abnormal   Collection Time: 08/20/15  5:50 PM  Result Value Ref Range   Prothrombin Time 15.4 (H) 11.6 - 15.2 seconds   INR 1.21 0.00 - 1.49  CK     Status: None   Collection Time: 08/20/15  5:50 PM  Result Value Ref Range   Total CK 60 38 - 234 U/L  Urine microscopic-add on     Status: Abnormal   Collection Time: 08/20/15  5:50 PM  Result Value Ref Range   Squamous Epithelial / LPF 0-5 (A) NONE SEEN   WBC, UA 0-5 0 - 5 WBC/hpf   RBC / HPF 0-5 0 - 5 RBC/hpf   Bacteria, UA RARE (  A) NONE SEEN  Urine  rapid drug screen (hosp performed)     Status: Abnormal   Collection Time: 08/20/15  5:50 PM  Result Value Ref Range   Opiates NONE DETECTED NONE DETECTED   Cocaine NONE DETECTED NONE DETECTED   Benzodiazepines POSITIVE (A) NONE DETECTED   Amphetamines NONE DETECTED NONE DETECTED   Tetrahydrocannabinol NONE DETECTED NONE DETECTED   Barbiturates NONE DETECTED NONE DETECTED    Comment:        DRUG SCREEN FOR MEDICAL PURPOSES ONLY.  IF CONFIRMATION IS NEEDED FOR ANY PURPOSE, NOTIFY LAB WITHIN 5 DAYS.        LOWEST DETECTABLE LIMITS FOR URINE DRUG SCREEN Drug Class       Cutoff (ng/mL) Amphetamine      1000 Barbiturate      200 Benzodiazepine   106 Tricyclics       269 Opiates          300 Cocaine          300 THC              50   Triglycerides     Status: None   Collection Time: 08/20/15  5:50 PM  Result Value Ref Range   Triglycerides 124 <150 mg/dL  D-dimer, quantitative (not at [facility])     Status: Abnormal   Collection Time: 08/20/15  5:50 PM  Result Value Ref Range   D-Dimer, Quant 2.86 (H) 0.00 - 0.50 ug/mL-FEU    Comment: (NOTE) At the manufacturer cut-off of 0.50 ug/mL FEU, this assay has been documented to exclude PE with a sensitivity and negative predictive value of 97 to 99%.  At this time, this assay has not been approved by the FDA to exclude DVT/VTE. Results should be correlated with clinical presentation.   CSF culture     Status: None (Preliminary result)   Collection Time: 08/20/15  6:00 PM  Result Value Ref Range   Specimen Description CSF    Special Requests NONE    Gram Stain      CYTOSPIN SMEAR WBC PRESENT,BOTH PMN AND MONONUCLEAR NO ORGANISMS SEEN Performed at [facility]    Culture PENDING    Report Status PENDING   I-Stat CG4 Lactic Acid, ED  (not at  [facility])     Status: Abnormal   Collection Time: 08/20/15  6:01 PM  Result Value Ref Range   Lactic Acid, Venous 15.24 (HH) 0.5 - 2.0 mmol/L   Comment NOTIFIED PHYSICIAN   Gram stain      Status: None   Collection Time: 08/20/15  6:50 PM  Result Value Ref Range   Specimen Description CSF    Special Requests NONE    Gram Stain      CYTOSPIN SMEAR NO ORGANISMS SEEN WBC PRESENT,BOTH PMN AND MONONUCLEAR Performed at [facility]   Report Status 08/20/2015 FINAL   Glucose, CSF     Status: Abnormal   Collection Time: 08/20/15  6:50 PM  Result Value Ref Range   Glucose, CSF 139 (H) 40 - 70 mg/dL  Protein, CSF     Status: None   Collection Time: 08/20/15  6:50 PM  Result Value Ref Range   Total  Protein, CSF 37 15 - 45 mg/dL  CSF cell count with differential collection tube #: 1     Status: Abnormal   Collection Time: 08/20/15  6:50 PM  Result Value Ref Range   Tube # 1    Color, CSF COLORLESS COLORLESS  Appearance, CSF CLEAR CLEAR   Supernatant COLORLESS    RBC Count, CSF 1392 (H) 0 /cu mm   WBC, CSF 0 0 - 5 /cu mm   Segmented Neutrophils-CSF TOO FEW TO COUNT, SMEAR AVAILABLE FOR REVIEW 0 - 6 %   Lymphs, CSF TOO FEW TO COUNT, SMEAR AVAILABLE FOR REVIEW 40 - 80 %   Monocyte-Macrophage-Spinal Fluid TOO FEW TO COUNT, SMEAR AVAILABLE FOR REVIEW 15 - 45 %   Eosinophils, CSF TOO FEW TO COUNT, SMEAR AVAILABLE FOR REVIEW 0 - 1 %   Other Cells, CSF RARE SEGMENTED NEUTROPHIL AND MONONUCLEAR CELLS   CSF cell count with differential collection tube #: 4     Status: Abnormal   Collection Time: 08/20/15  6:50 PM  Result Value Ref Range   Tube # 4    Color, CSF COLORLESS COLORLESS   Appearance, CSF CLEAR CLEAR   Supernatant COLORLESS    RBC Count, CSF 390 (H) 0 /cu mm   WBC, CSF 0 0 - 5 /cu mm   Segmented Neutrophils-CSF TOO FEW TO COUNT, SMEAR AVAILABLE FOR REVIEW 0 - 6 %   Lymphs, CSF TOO FEW TO COUNT, SMEAR AVAILABLE FOR REVIEW 40 - 80 %   Monocyte-Macrophage-Spinal Fluid TOO FEW TO COUNT, SMEAR AVAILABLE FOR REVIEW 15 - 45 %   Eosinophils, CSF TOO FEW TO COUNT, SMEAR AVAILABLE FOR REVIEW 0 - 1 %  Blood Culture (routine x 2)     Status: None (Preliminary result)    Collection Time: 08/20/15  7:15 PM  Result Value Ref Range   Specimen Description BLOOD RIGHT HAND    Special Requests BOTTLES DRAWN AEROBIC AND ANAEROBIC 6CC    Culture NO GROWTH < 24 HOURS    Report Status PENDING   Blood Culture (routine x 2)     Status: None (Preliminary result)   Collection Time: 08/20/15  7:22 PM  Result Value Ref Range   Specimen Description BLOOD RIGHT HAND    Special Requests BOTTLES DRAWN AEROBIC AND ANAEROBIC 6CC    Culture NO GROWTH < 24 HOURS    Report Status PENDING   CBG monitoring, ED     Status: Abnormal   Collection Time: 08/20/15  8:39 PM  Result Value Ref Range   Glucose-Capillary 443 (H) 65 - 99 mg/dL  MRSA PCR Screening     Status: None   Collection Time: 08/20/15  9:25 PM  Result Value Ref Range   MRSA by PCR NEGATIVE NEGATIVE    Comment:        The GeneXpert MRSA Assay (FDA approved for NASAL specimens only), is one component of a comprehensive MRSA colonization surveillance program. It is not intended to diagnose MRSA infection nor to guide or monitor treatment for MRSA infections.   Glucose, capillary     Status: Abnormal   Collection Time: 08/20/15 11:06 PM  Result Value Ref Range   Glucose-Capillary 405 (H) 65 - 99 mg/dL   Comment 1 Notify RN   Basic metabolic panel     Status: Abnormal   Collection Time: 08/20/15 11:26 PM  Result Value Ref Range   Sodium 133 (L) 135 - 145 mmol/L   Potassium 3.2 (L) 3.5 - 5.1 mmol/L   Chloride 102 101 - 111 mmol/L   CO2 24 22 - 32 mmol/L   Glucose, Bld 352 (H) 65 - 99 mg/dL   BUN 16 6 - 20 mg/dL   Creatinine, Ser 1.17 (H) 0.44 - 1.00 mg/dL   Calcium 8.0 (  L) 8.9 - 10.3 mg/dL   GFR calc non Af Amer 50 (L) >60 mL/min   GFR calc Af Amer 57 (L) >60 mL/min    Comment: (NOTE) The eGFR has been calculated using the CKD EPI equation. This calculation has not been validated in all clinical situations. eGFR's persistently <60 mL/min signify possible Chronic Kidney Disease.    Anion gap 7 5 - 15   Lactic acid, plasma     Status: None   Collection Time: 08/20/15 11:26 PM  Result Value Ref Range   Lactic Acid, Venous 2.0 0.5 - 2.0 mmol/L  Blood gas, arterial     Status: Abnormal   Collection Time: 08/20/15 11:50 PM  Result Value Ref Range   FIO2 40.00    Delivery systems VENTILATOR    Mode PRESSURE REGULATED VOLUME CONTROL    VT 500.0 mL   LHR 20.0 resp/min   Peep/cpap 5.0 cm H20   pH, Arterial 7.506 (H) 7.350 - 7.450   pCO2 arterial 28.4 (L) 35.0 - 45.0 mmHg   pO2, Arterial 151.0 (H) 80.0 - 100.0 mmHg   Bicarbonate 24.8 (H) 20.0 - 24.0 mEq/L   TCO2 16.6 0 - 100 mmol/L   Acid-Base Excess 0.5 0.0 - 2.0 mmol/L   O2 Saturation 99.0 %   Patient temperature 37.0    Collection site LEFT RADIAL    Drawn by [id number]    Sample type ARTERIAL    Allens test (pass/fail) PASS PASS  Glucose, capillary     Status: Abnormal   Collection Time: 08/21/15 12:18 AM  Result Value Ref Range   Glucose-Capillary 400 (H) 65 - 99 mg/dL   Comment 1 Notify RN   Glucose, capillary     Status: Abnormal   Collection Time: 08/21/15  1:27 AM  Result Value Ref Range   Glucose-Capillary 206 (H) 65 - 99 mg/dL   Comment 1 Notify RN   Basic metabolic panel     Status: Abnormal   Collection Time: 08/21/15  2:39 AM  Result Value Ref Range   Sodium 137 135 - 145 mmol/L   Potassium 3.2 (L) 3.5 - 5.1 mmol/L   Chloride 107 101 - 111 mmol/L   CO2 23 22 - 32 mmol/L   Glucose, Bld 95 65 - 99 mg/dL   BUN 14 6 - 20 mg/dL   Creatinine, Ser 1.08 (H) 0.44 - 1.00 mg/dL   Calcium 8.3 (L) 8.9 - 10.3 mg/dL   GFR calc non Af Amer 55 (L) >60 mL/min   GFR calc Af Amer >60 >60 mL/min    Comment: (NOTE) The eGFR has been calculated using the CKD EPI equation. This calculation has not been validated in all clinical situations. eGFR's persistently <60 mL/min signify possible Chronic Kidney Disease.    Anion gap 7 5 - 15  CBC     Status: Abnormal   Collection Time: 08/21/15  2:39 AM  Result Value Ref Range   WBC 9.3  4.0 - 10.5 K/uL   RBC 3.73 (L) 3.87 - 5.11 MIL/uL   Hemoglobin 11.2 (L) 12.0 - 15.0 g/dL   HCT 33.4 (L) 36.0 - 46.0 %   MCV 89.5 78.0 - 100.0 fL   MCH 30.0 26.0 - 34.0 pg   MCHC 33.5 30.0 - 36.0 g/dL   RDW 13.2 11.5 - 15.5 %   Platelets 159 150 - 400 K/uL  Lactic acid, plasma     Status: Abnormal   Collection Time: 08/21/15  2:39 AM  Result Value  Ref Range   Lactic Acid, Venous 2.7 (HH) 0.5 - 2.0 mmol/L    Comment: CRITICAL RESULT CALLED TO, READ BACK BY AND VERIFIED WITH: MIZE J AT 0352 ON [id number] BY FORSYTH K   Hemoglobin A1c     Status: None   Collection Time: 08/21/15  2:39 AM  Result Value Ref Range   Hgb A1c MFr Bld 5.3 4.8 - 5.6 %    Comment: (NOTE)         Pre-diabetes: 5.7 - 6.4         Diabetes: >6.4         Glycemic control for adults with diabetes: <7.0    Mean Plasma Glucose 105 mg/dL    Comment: (NOTE) Performed At: [facility] [place], [place] [id number] Lindon Romp MD OV:[id number]   Troponin I (q 6hr x 3)     Status: Abnormal   Collection Time: 08/21/15  2:39 AM  Result Value Ref Range   Troponin I 0.52 (HH) <0.031 ng/mL    Comment:        POSSIBLE MYOCARDIAL ISCHEMIA. SERIAL TESTING RECOMMENDED. DELTA CHECK NOTED CRITICAL RESULT CALLED TO, READ BACK BY AND VERIFIED WITH: Bethann Humble AT 0356 ON [id number] BY FORSYTH K   Glucose, capillary     Status: None   Collection Time: 08/21/15  4:34 AM  Result Value Ref Range   Glucose-Capillary 71 65 - 99 mg/dL  Glucose, capillary     Status: None   Collection Time: 08/21/15  6:18 AM  Result Value Ref Range   Glucose-Capillary 87 65 - 99 mg/dL  Basic metabolic panel     Status: Abnormal   Collection Time: 08/21/15  7:31 AM  Result Value Ref Range   Sodium 137 135 - 145 mmol/L   Potassium 3.0 (L) 3.5 - 5.1 mmol/L   Chloride 108 101 - 111 mmol/L   CO2 24 22 - 32 mmol/L   Glucose, Bld 104 (H) 65 - 99 mg/dL   BUN 13 6 - 20 mg/dL   Creatinine, Ser 0.95 0.44 - 1.00 mg/dL   Calcium 8.4  (L) 8.9 - 10.3 mg/dL   GFR calc non Af Amer >60 >60 mL/min   GFR calc Af Amer >60 >60 mL/min    Comment: (NOTE) The eGFR has been calculated using the CKD EPI equation. This calculation has not been validated in all clinical situations. eGFR's persistently <60 mL/min signify possible Chronic Kidney Disease.    Anion gap 5 5 - 15  Troponin I (q 6hr x 3)     Status: Abnormal   Collection Time: 08/21/15  7:31 AM  Result Value Ref Range   Troponin I 0.39 (H) <0.031 ng/mL    Comment:        PERSISTENTLY INCREASED TROPONIN VALUES IN THE RANGE OF 0.04-0.49 ng/mL CAN BE SEEN IN:       -UNSTABLE ANGINA       -CONGESTIVE HEART FAILURE       -MYOCARDITIS       -CHEST TRAUMA       -ARRYHTHMIAS       -LATE PRESENTING MYOCARDIAL INFARCTION       -COPD   CLINICAL FOLLOW-UP RECOMMENDED.   Glucose, capillary     Status: Abnormal   Collection Time: 08/21/15  7:41 AM  Result Value Ref Range   Glucose-Capillary 107 (H) 65 - 99 mg/dL   Comment 1 Notify RN    Comment 2 Document in Chart   Basic  metabolic panel     Status: Abnormal   Collection Time: 08/21/15 10:41 AM  Result Value Ref Range   Sodium 136 135 - 145 mmol/L   Potassium 3.1 (L) 3.5 - 5.1 mmol/L   Chloride 107 101 - 111 mmol/L   CO2 23 22 - 32 mmol/L   Glucose, Bld 159 (H) 65 - 99 mg/dL   BUN 11 6 - 20 mg/dL   Creatinine, Ser 1.74 0.44 - 1.00 mg/dL   Calcium 8.2 (L) 8.9 - 10.3 mg/dL   GFR calc non Af Amer >60 >60 mL/min   GFR calc Af Amer >60 >60 mL/min    Comment: (NOTE) The eGFR has been calculated using the CKD EPI equation. This calculation has not been validated in all clinical situations. eGFR's persistently <60 mL/min signify possible Chronic Kidney Disease.    Anion gap 6 5 - 15  Troponin I (q 6hr x 3)     Status: Abnormal   Collection Time: 08/21/15 10:41 AM  Result Value Ref Range   Troponin I 0.34 (H) <0.031 ng/mL    Comment:        PERSISTENTLY INCREASED TROPONIN VALUES IN THE RANGE OF 0.04-0.49 ng/mL CAN  BE SEEN IN:       -UNSTABLE ANGINA       -CONGESTIVE HEART FAILURE       -MYOCARDITIS       -CHEST TRAUMA       -ARRYHTHMIAS       -LATE PRESENTING MYOCARDIAL INFARCTION       -COPD   CLINICAL FOLLOW-UP RECOMMENDED.   Glucose, capillary     Status: Abnormal   Collection Time: 08/21/15 11:28 AM  Result Value Ref Range   Glucose-Capillary 184 (H) 65 - 99 mg/dL   Comment 1 Notify RN    Comment 2 Document in Chart   Occult blood gastric / duodenum     Status: Abnormal   Collection Time: 08/21/15  2:13 PM  Result Value Ref Range   pH, Gastric 3    Occult Blood, Gastric POSITIVE (A) NEGATIVE  Basic metabolic panel     Status: Abnormal   Collection Time: 08/21/15  3:11 PM  Result Value Ref Range   Sodium 134 (L) 135 - 145 mmol/L   Potassium 3.7 3.5 - 5.1 mmol/L   Chloride 106 101 - 111 mmol/L   CO2 22 22 - 32 mmol/L   Glucose, Bld 192 (H) 65 - 99 mg/dL   BUN 10 6 - 20 mg/dL   Creatinine, Ser 0.99 0.44 - 1.00 mg/dL   Calcium 8.5 (L) 8.9 - 10.3 mg/dL   GFR calc non Af Amer >60 >60 mL/min   GFR calc Af Amer >60 >60 mL/min    Comment: (NOTE) The eGFR has been calculated using the CKD EPI equation. This calculation has not been validated in all clinical situations. eGFR's persistently <60 mL/min signify possible Chronic Kidney Disease.    Anion gap 6 5 - 15  Troponin I (q 6hr x 3)     Status: Abnormal   Collection Time: 08/21/15  3:11 PM  Result Value Ref Range   Troponin I 0.53 (HH) <0.031 ng/mL    Comment:        POSSIBLE MYOCARDIAL ISCHEMIA. SERIAL TESTING RECOMMENDED. CRITICAL RESULT CALLED TO, READ BACK BY AND VERIFIED WITH: SCHONEWITZ,L ON 08/21/15 AT 1755 BY LOY,C   CBC     Status: Abnormal   Collection Time: 08/21/15  3:11 PM  Result Value Ref Range  WBC 9.3 4.0 - 10.5 K/uL   RBC 3.87 3.87 - 5.11 MIL/uL   Hemoglobin 11.6 (L) 12.0 - 15.0 g/dL   HCT 34.3 (L) 36.0 - 46.0 %   MCV 88.6 78.0 - 100.0 fL   MCH 30.0 26.0 - 34.0 pg   MCHC 33.8 30.0 - 36.0 g/dL   RDW  13.4 11.5 - 15.5 %   Platelets 130 (L) 150 - 400 K/uL  Glucose, capillary     Status: Abnormal   Collection Time: 08/21/15  4:59 PM  Result Value Ref Range   Glucose-Capillary 201 (H) 65 - 99 mg/dL   Comment 1 Notify RN    Comment 2 Document in Chart   Basic metabolic panel     Status: Abnormal   Collection Time: 08/21/15  6:49 PM  Result Value Ref Range   Sodium 133 (L) 135 - 145 mmol/L   Potassium 3.2 (L) 3.5 - 5.1 mmol/L   Chloride 107 101 - 111 mmol/L   CO2 20 (L) 22 - 32 mmol/L   Glucose, Bld 179 (H) 65 - 99 mg/dL   BUN 10 6 - 20 mg/dL   Creatinine, Ser 0.93 0.44 - 1.00 mg/dL   Calcium 8.4 (L) 8.9 - 10.3 mg/dL   GFR calc non Af Amer >60 >60 mL/min   GFR calc Af Amer >60 >60 mL/min    Comment: (NOTE) The eGFR has been calculated using the CKD EPI equation. This calculation has not been validated in all clinical situations. eGFR's persistently <60 mL/min signify possible Chronic Kidney Disease.    Anion gap 6 5 - 15  Glucose, capillary     Status: Abnormal   Collection Time: 08/21/15  8:39 PM  Result Value Ref Range   Glucose-Capillary 157 (H) 65 - 99 mg/dL   Comment 1 Notify RN   Troponin I (q 6hr x 3)     Status: Abnormal   Collection Time: 08/21/15 10:36 PM  Result Value Ref Range   Troponin I 0.35 (H) <0.031 ng/mL    Comment:        PERSISTENTLY INCREASED TROPONIN VALUES IN THE RANGE OF 0.04-0.49 ng/mL CAN BE SEEN IN:       -UNSTABLE ANGINA       -CONGESTIVE HEART FAILURE       -MYOCARDITIS       -CHEST TRAUMA       -ARRYHTHMIAS       -LATE PRESENTING MYOCARDIAL INFARCTION       -COPD   CLINICAL FOLLOW-UP RECOMMENDED.   CBC     Status: Abnormal   Collection Time: 08/21/15 10:36 PM  Result Value Ref Range   WBC 8.1 4.0 - 10.5 K/uL   RBC 3.59 (L) 3.87 - 5.11 MIL/uL   Hemoglobin 10.9 (L) 12.0 - 15.0 g/dL   HCT 31.6 (L) 36.0 - 46.0 %   MCV 88.0 78.0 - 100.0 fL   MCH 30.4 26.0 - 34.0 pg   MCHC 34.5 30.0 - 36.0 g/dL   RDW 13.5 11.5 - 15.5 %   Platelets  133 (L) 150 - 400 K/uL  Glucose, capillary     Status: Abnormal   Collection Time: 08/22/15 12:14 AM  Result Value Ref Range   Glucose-Capillary 104 (H) 65 - 99 mg/dL  Glucose, capillary     Status: Abnormal   Collection Time: 08/22/15  3:52 AM  Result Value Ref Range   Glucose-Capillary 214 (H) 65 - 99 mg/dL   Comment 1 Notify RN  Blood gas, arterial     Status: Abnormal   Collection Time: 08/22/15  5:35 AM  Result Value Ref Range   FIO2 40.00    Delivery systems VENTILATOR    Mode PRESSURE REGULATED VOLUME CONTROL    VT 500.0 mL   LHR 20.0 resp/min   Peep/cpap 5.0 cm H20   pH, Arterial 7.568 (H) 7.350 - 7.450   pCO2 arterial 23.5 (L) 35.0 - 45.0 mmHg   pO2, Arterial 177.0 (H) 80.0 - 100.0 mmHg   Bicarbonate 25.0 (H) 20.0 - 24.0 mEq/L   TCO2 15.4 0 - 100 mmol/L   Acid-Base Excess 0.6 0.0 - 2.0 mmol/L   O2 Saturation 99.2 %   Patient temperature 37.0    Collection site LEFT RADIAL    Drawn by [id number]    Sample type ARTERIAL    Allens test (pass/fail) PASS PASS    Lipid Panel  Recent Labs  08/20/15 1750  TRIG 124    Studies/Results:   Medications:  Scheduled Meds: . antiseptic oral rinse  7 mL Mouth Rinse QID  . aspirin  300 mg Rectal Daily  . chlorhexidine gluconate (SAGE KIT)  15 mL Mouth Rinse BID  . famotidine (PEPCID) IV  20 mg Intravenous Q12H  . heparin  5,000 Units Subcutaneous Q8H  . insulin aspart  0-9 Units Subcutaneous Q4H  . levETIRAcetam  500 mg Intravenous Q12H   Continuous Infusions: . dextrose 5 % and 0.45% NaCl 1,000 mL (08/22/15 0748)  . propofol (DIPRIVAN) infusion 24.055 mcg/kg/min (08/22/15 0734)   PRN Meds:.sodium chloride, acetaminophen **OR** acetaminophen (TYLENOL) oral liquid 160 mg/5 mL **OR** acetaminophen, etomidate, fentaNYL (SUBLIMAZE) injection, midazolam, rocuronium     LOS: 2 days   Dajanee Voorheis A. Merlene Laughter, M.D.  Diplomate, Tax adviser of Psychiatry and Neurology ( Neurology).

## 2015-08-22 NOTE — Procedures (Signed)
  Veronica Lake A. Merlene Laughter, MD     [email or web address]           HISTORY: The patient is a 59 year old presents with multiple seizures/status epilepticus.  MEDICATIONS: Scheduled Meds: . antiseptic oral rinse  7 mL Mouth Rinse QID  . aspirin  300 mg Rectal Daily  . chlorhexidine gluconate (SAGE KIT)  15 mL Mouth Rinse BID  . famotidine (PEPCID) IV  20 mg Intravenous Q12H  . heparin  5,000 Units Subcutaneous Q8H  . insulin aspart  0-9 Units Subcutaneous Q4H  . levETIRAcetam  500 mg Intravenous Q12H   Continuous Infusions: . dextrose 5 % and 0.45% NaCl 1,000 mL (08/22/15 0748)  . propofol (DIPRIVAN) infusion 24.055 mcg/kg/min (08/22/15 0734)   PRN Meds:.sodium chloride, acetaminophen **OR** acetaminophen (TYLENOL) oral liquid 160 mg/5 mL **OR** acetaminophen, etomidate, fentaNYL (SUBLIMAZE) injection, midazolam, rocuronium  Prior to Admission medications   Medication Sig Start Date End Date Taking? Authorizing Provider  acetaminophen (TYLENOL) 500 MG tablet Take 1,000 mg by mouth every 6 (six) hours as needed for mild pain or moderate pain.   Yes Historical Provider, MD  ALPRAZolam Duanne Moron) 1 MG tablet Take 1 mg by mouth 3 (three) times daily as needed. For anxiety   Yes Historical Provider, MD  aspirin EC 81 MG tablet Take 81 mg by mouth daily.   Yes Historical Provider, MD  cyclobenzaprine (FLEXERIL) 10 MG tablet Take 1 tablet (10 mg total) by mouth 3 (three) times daily as needed for muscle spasms. 05/31/14  Yes Orpah Greek, MD  insulin glargine (LANTUS) 100 UNIT/ML injection Inject 22 Units into the skin every morning.    Yes Historical Provider, MD  NOVOLOG FLEXPEN 100 UNIT/ML FlexPen Inject 2-14 Units into the skin 3 (three) times daily before meals.  04/27/14  Yes Historical Provider, MD  RESTASIS 0.05 % ophthalmic emulsion Apply 1 drop to eye 2 (two) times daily. 04/29/14  Yes Historical Provider, MD  sertraline (ZOLOFT) 50 MG tablet TK 1 T PO QD 08/14/15   Yes Historical Provider, MD  cephALEXin (KEFLEX) 500 MG capsule Take 1 capsule (500 mg total) by mouth 3 (three) times daily. Patient not taking: Reported on 08/20/2015 05/21/15   Virgel Manifold, MD      ANALYSIS: A 16 channel recording using standard 10 20 measurements is conducted for 24 minutes. The background activity is as high as 6-1/2 Hz. However, the recording is replete with delta slowing which occurs episodically throughout the recording of the rate of 3-4 Hz. There is spindles and K complexes observed. There are infrequent frontal intermittent rhythmic delta activities observed. Photic stimulation and hyperventilation are not carried out. At the end of the study, there are significant burst suppression pattern seen mostly about 3 seconds long but did get as high as 5-1/2 seconds. There are no clear epileptiform discharges or electrical Seizures observed.   IMPRESSION: This recording is abnormal for the following reasons: 1. 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 2. Burst suppression pattern which in most cases is due to medication effect such as from propofol. Severe brain injuries is also possibility but less likely. 3. Frontal intermittent rhythmic delta activity which is typically seen in toxic metabolic encephalopathies.      Veronica Moses, M.D.  Diplomate, Tax adviser of Psychiatry and Neurology ( Neurology).

## 2015-08-22 NOTE — Progress Notes (Signed)
Patient FVC 677ml, ABG results within normal range and results called to physician. RT extubated to 4L O2 via nasal cannula, patient SATs 99%, HR 78 and RR 25. Patient remained stable throughout, RN and family at bedside./ RT will continue to monitor.

## 2015-08-22 NOTE — Progress Notes (Addendum)
Inpatient Diabetes Program Recommendations  AACE/ADA: New Consensus Statement on Inpatient Glycemic Control (2015)  Target Ranges:  Prepandial:   less than 140 mg/dL      Peak postprandial:   less than 180 mg/dL (1-2 hours)      Critically ill patients:  140 - 180 mg/dL  Results for CLEMMA, MANDALA (MRN [id number]) as of 08/22/2015 10:47  Ref. Range 08/21/2015 07:41 08/21/2015 11:28 08/21/2015 16:59 08/21/2015 20:39 08/22/2015 00:14 08/22/2015 03:52 08/22/2015 08:02  Glucose-Capillary Latest Ref Range: 65-99 mg/dL 107 (H) 184 (H) 201 (H) 157 (H) 104 (H) 214 (H) 204 (H)    Review of Glycemic Control  Diabetes history: DM2 Outpatient Diabetes medications: Lantus 22 units QAM, Novolog 2-14 units TID with meals Current orders for Inpatient glycemic control: Novolog 0-9 units Q4H  Inpatient Diabetes Program Recommendations: Insulin - Basal: Glucose has been over 200 mg/dl x2. May want to consider ordering Lantus 6 units Q24H (based on 55.6 kg x 0.1 units).  Thanks, Barnie Alderman, RN, MSN, CDE Diabetes Coordinator Inpatient Diabetes Program [phone] (Team Pager from [place] to [place]) [phone] ([facility] office) [phone] [facility] office) [phone] [facility] office)

## 2015-08-22 NOTE — Progress Notes (Signed)
Extubated and placed on 4 lpm nasal cannula

## 2015-08-22 NOTE — Progress Notes (Signed)
Subjective: She is overall about the same. No seizures. She is still on the ventilator. Studies thus far are negative. She still has an elevated troponin but her echocardiogram was normal so I'm not going to get a cardiology consultation considering that it looks like this is demand ischemia and perhaps related to her multiple seizures. EEG is pending. All the other studies thus far are negative.  Dr. Freddie Apley help is noted and appreciated  Objective: Vital signs in last 24 hours: Temp:  [98.2 F (36.8 C)-101 F (38.3 C)] 98.2 F (36.8 C) (06/07 0400) Pulse Rate:  [61-78] 65 (06/07 0500) Resp:  [10-20] 17 (06/07 0100) BP: (75-138)/(40-77) 75/40 mmHg (06/07 0500) SpO2:  [97 %-100 %] 98 % (06/07 0500) FiO2 (%):  [40 %] 40 % (06/07 0343) Weight:  [55.6 kg (122 lb 9.2 oz)] 55.6 kg (122 lb 9.2 oz) (06/07 0500) Weight change: 7.065 kg (15 lb 9.2 oz) Last BM Date: 08/20/15  Intake/Output from previous day: 06/06 0701 - 06/07 0700 In: 2389.5 [I.V.:1279.8; IV Piggyback:1109.7] Out: 2850 [Urine:1500]  PHYSICAL EXAM General appearance: She is intubated sedated on mechanical ventilation Resp: clear to auscultation bilaterally Cardio: regular rate and rhythm, S1, S2 normal, no murmur, click, rub or gallop GI: soft, non-tender; bowel sounds normal; no masses,  no organomegaly Extremities: extremities normal, atraumatic, no cyanosis or edema  Lab Results:  Results for orders placed or performed during the hospital encounter of 08/20/15 (from the past 48 hour(s))  Glucose, capillary     Status: Abnormal   Collection Time: 08/20/15  5:38 PM  Result Value Ref Range   Glucose-Capillary 379 (H) 65 - 99 mg/dL  Comprehensive metabolic panel     Status: Abnormal   Collection Time: 08/20/15  5:50 PM  Result Value Ref Range   Sodium 133 (L) 135 - 145 mmol/L   Potassium 3.5 3.5 - 5.1 mmol/L   Chloride 99 (L) 101 - 111 mmol/L   CO2 14 (L) 22 - 32 mmol/L   Glucose, Bld 404 (H) 65 - 99 mg/dL    BUN 17 6 - 20 mg/dL   Creatinine, Ser 1.39 (H) 0.44 - 1.00 mg/dL   Calcium 8.3 (L) 8.9 - 10.3 mg/dL   Total Protein 6.8 6.5 - 8.1 g/dL   Albumin 3.8 3.5 - 5.0 g/dL   AST 34 15 - 41 U/L   ALT 21 14 - 54 U/L   Alkaline Phosphatase 32 (L) 38 - 126 U/L   Total Bilirubin 0.4 0.3 - 1.2 mg/dL   GFR calc non Af Amer 40 (L) >60 mL/min   GFR calc Af Amer 47 (L) >60 mL/min    Comment: (NOTE) The eGFR has been calculated using the CKD EPI equation. This calculation has not been validated in all clinical situations. eGFR's persistently <60 mL/min signify possible Chronic Kidney Disease.    Anion gap 20 (H) 5 - 15  CBC WITH DIFFERENTIAL     Status: Abnormal   Collection Time: 08/20/15  5:50 PM  Result Value Ref Range   WBC 14.5 (H) 4.0 - 10.5 K/uL   RBC 4.40 3.87 - 5.11 MIL/uL   Hemoglobin 13.1 12.0 - 15.0 g/dL   HCT 40.9 36.0 - 46.0 %   MCV 93.0 78.0 - 100.0 fL   MCH 29.8 26.0 - 34.0 pg   MCHC 32.0 30.0 - 36.0 g/dL   RDW 13.1 11.5 - 15.5 %   Platelets 271 150 - 400 K/uL   Neutrophils Relative % 70 %  Neutro Abs 10.0 (H) 1.7 - 7.7 K/uL   Lymphocytes Relative 26 %   Lymphs Abs 3.7 0.7 - 4.0 K/uL   Monocytes Relative 5 %   Monocytes Absolute 0.7 0.1 - 1.0 K/uL   Eosinophils Relative 0 %   Eosinophils Absolute 0.0 0.0 - 0.7 K/uL   Basophils Relative 0 %   Basophils Absolute 0.0 0.0 - 0.1 K/uL   WBC Morphology WHITE COUNT CONFIRMED ON SMEAR   Urinalysis, Routine w reflex microscopic (not at [facility])     Status: Abnormal   Collection Time: 08/20/15  5:50 PM  Result Value Ref Range   Color, Urine YELLOW YELLOW   APPearance CLEAR CLEAR   Specific Gravity, Urine 1.015 1.005 - 1.030   pH 5.5 5.0 - 8.0   Glucose, UA 500 (A) NEGATIVE mg/dL   Hgb urine dipstick SMALL (A) NEGATIVE   Bilirubin Urine NEGATIVE NEGATIVE   Ketones, ur NEGATIVE NEGATIVE mg/dL   Protein, ur NEGATIVE NEGATIVE mg/dL   Nitrite NEGATIVE NEGATIVE   Leukocytes, UA NEGATIVE NEGATIVE  Troponin I     Status: Abnormal    Collection Time: 08/20/15  5:50 PM  Result Value Ref Range   Troponin I 0.06 (H) <0.031 ng/mL    Comment:        PERSISTENTLY INCREASED TROPONIN VALUES IN THE RANGE OF 0.04-0.49 ng/mL CAN BE SEEN IN:       -UNSTABLE ANGINA       -CONGESTIVE HEART FAILURE       -MYOCARDITIS       -CHEST TRAUMA       -ARRYHTHMIAS       -LATE PRESENTING MYOCARDIAL INFARCTION       -COPD   CLINICAL FOLLOW-UP RECOMMENDED.   Protime-INR     Status: Abnormal   Collection Time: 08/20/15  5:50 PM  Result Value Ref Range   Prothrombin Time 15.4 (H) 11.6 - 15.2 seconds   INR 1.21 0.00 - 1.49  CK     Status: None   Collection Time: 08/20/15  5:50 PM  Result Value Ref Range   Total CK 60 38 - 234 U/L  Urine microscopic-add on     Status: Abnormal   Collection Time: 08/20/15  5:50 PM  Result Value Ref Range   Squamous Epithelial / LPF 0-5 (A) NONE SEEN   WBC, UA 0-5 0 - 5 WBC/hpf   RBC / HPF 0-5 0 - 5 RBC/hpf   Bacteria, UA RARE (A) NONE SEEN  Urine rapid drug screen (hosp performed)     Status: Abnormal   Collection Time: 08/20/15  5:50 PM  Result Value Ref Range   Opiates NONE DETECTED NONE DETECTED   Cocaine NONE DETECTED NONE DETECTED   Benzodiazepines POSITIVE (A) NONE DETECTED   Amphetamines NONE DETECTED NONE DETECTED   Tetrahydrocannabinol NONE DETECTED NONE DETECTED   Barbiturates NONE DETECTED NONE DETECTED    Comment:        DRUG SCREEN FOR MEDICAL PURPOSES ONLY.  IF CONFIRMATION IS NEEDED FOR ANY PURPOSE, NOTIFY LAB WITHIN 5 DAYS.        LOWEST DETECTABLE LIMITS FOR URINE DRUG SCREEN Drug Class       Cutoff (ng/mL) Amphetamine      1000 Barbiturate      200 Benzodiazepine   384 Tricyclics       665 Opiates          300 Cocaine          300 THC  50   Triglycerides     Status: None   Collection Time: 08/20/15  5:50 PM  Result Value Ref Range   Triglycerides 124 <150 mg/dL  D-dimer, quantitative (not at [facility])     Status: Abnormal   Collection Time: 08/20/15  5:50  PM  Result Value Ref Range   D-Dimer, Quant 2.86 (H) 0.00 - 0.50 ug/mL-FEU    Comment: (NOTE) At the manufacturer cut-off of 0.50 ug/mL FEU, this assay has been documented to exclude PE with a sensitivity and negative predictive value of 97 to 99%.  At this time, this assay has not been approved by the FDA to exclude DVT/VTE. Results should be correlated with clinical presentation.   CSF culture     Status: None (Preliminary result)   Collection Time: 08/20/15  6:00 PM  Result Value Ref Range   Specimen Description CSF    Special Requests NONE    Gram Stain      CYTOSPIN SMEAR WBC PRESENT,BOTH PMN AND MONONUCLEAR NO ORGANISMS SEEN Performed at [facility]    Culture PENDING    Report Status PENDING   I-Stat CG4 Lactic Acid, ED  (not at  [facility])     Status: Abnormal   Collection Time: 08/20/15  6:01 PM  Result Value Ref Range   Lactic Acid, Venous 15.24 (HH) 0.5 - 2.0 mmol/L   Comment NOTIFIED PHYSICIAN   Gram stain     Status: None   Collection Time: 08/20/15  6:50 PM  Result Value Ref Range   Specimen Description CSF    Special Requests NONE    Gram Stain      CYTOSPIN SMEAR NO ORGANISMS SEEN WBC PRESENT,BOTH PMN AND MONONUCLEAR Performed at [facility]   Report Status 08/20/2015 FINAL   Glucose, CSF     Status: Abnormal   Collection Time: 08/20/15  6:50 PM  Result Value Ref Range   Glucose, CSF 139 (H) 40 - 70 mg/dL  Protein, CSF     Status: None   Collection Time: 08/20/15  6:50 PM  Result Value Ref Range   Total  Protein, CSF 37 15 - 45 mg/dL  CSF cell count with differential collection tube #: 1     Status: Abnormal   Collection Time: 08/20/15  6:50 PM  Result Value Ref Range   Tube # 1    Color, CSF COLORLESS COLORLESS   Appearance, CSF CLEAR CLEAR   Supernatant COLORLESS    RBC Count, CSF 1392 (H) 0 /cu mm   WBC, CSF 0 0 - 5 /cu mm   Segmented Neutrophils-CSF TOO FEW TO COUNT, SMEAR AVAILABLE FOR REVIEW 0 - 6 %   Lymphs, CSF TOO FEW TO COUNT,  SMEAR AVAILABLE FOR REVIEW 40 - 80 %   Monocyte-Macrophage-Spinal Fluid TOO FEW TO COUNT, SMEAR AVAILABLE FOR REVIEW 15 - 45 %   Eosinophils, CSF TOO FEW TO COUNT, SMEAR AVAILABLE FOR REVIEW 0 - 1 %   Other Cells, CSF RARE SEGMENTED NEUTROPHIL AND MONONUCLEAR CELLS   CSF cell count with differential collection tube #: 4     Status: Abnormal   Collection Time: 08/20/15  6:50 PM  Result Value Ref Range   Tube # 4    Color, CSF COLORLESS COLORLESS   Appearance, CSF CLEAR CLEAR   Supernatant COLORLESS    RBC Count, CSF 390 (H) 0 /cu mm   WBC, CSF 0 0 - 5 /cu mm   Segmented Neutrophils-CSF TOO FEW TO COUNT, SMEAR  AVAILABLE FOR REVIEW 0 - 6 %   Lymphs, CSF TOO FEW TO COUNT, SMEAR AVAILABLE FOR REVIEW 40 - 80 %   Monocyte-Macrophage-Spinal Fluid TOO FEW TO COUNT, SMEAR AVAILABLE FOR REVIEW 15 - 45 %   Eosinophils, CSF TOO FEW TO COUNT, SMEAR AVAILABLE FOR REVIEW 0 - 1 %  Blood Culture (routine x 2)     Status: None (Preliminary result)   Collection Time: 08/20/15  7:15 PM  Result Value Ref Range   Specimen Description BLOOD RIGHT HAND    Special Requests BOTTLES DRAWN AEROBIC AND ANAEROBIC 6CC    Culture NO GROWTH < 24 HOURS    Report Status PENDING   Blood Culture (routine x 2)     Status: None (Preliminary result)   Collection Time: 08/20/15  7:22 PM  Result Value Ref Range   Specimen Description BLOOD RIGHT HAND    Special Requests BOTTLES DRAWN AEROBIC AND ANAEROBIC 6CC    Culture NO GROWTH < 24 HOURS    Report Status PENDING   CBG monitoring, ED     Status: Abnormal   Collection Time: 08/20/15  8:39 PM  Result Value Ref Range   Glucose-Capillary 443 (H) 65 - 99 mg/dL  MRSA PCR Screening     Status: None   Collection Time: 08/20/15  9:25 PM  Result Value Ref Range   MRSA by PCR NEGATIVE NEGATIVE    Comment:        The GeneXpert MRSA Assay (FDA approved for NASAL specimens only), is one component of a comprehensive MRSA colonization surveillance program. It is not intended to  diagnose MRSA infection nor to guide or monitor treatment for MRSA infections.   Glucose, capillary     Status: Abnormal   Collection Time: 08/20/15 11:06 PM  Result Value Ref Range   Glucose-Capillary 405 (H) 65 - 99 mg/dL   Comment 1 Notify RN   Basic metabolic panel     Status: Abnormal   Collection Time: 08/20/15 11:26 PM  Result Value Ref Range   Sodium 133 (L) 135 - 145 mmol/L   Potassium 3.2 (L) 3.5 - 5.1 mmol/L   Chloride 102 101 - 111 mmol/L   CO2 24 22 - 32 mmol/L   Glucose, Bld 352 (H) 65 - 99 mg/dL   BUN 16 6 - 20 mg/dL   Creatinine, Ser 1.17 (H) 0.44 - 1.00 mg/dL   Calcium 8.0 (L) 8.9 - 10.3 mg/dL   GFR calc non Af Amer 50 (L) >60 mL/min   GFR calc Af Amer 57 (L) >60 mL/min    Comment: (NOTE) The eGFR has been calculated using the CKD EPI equation. This calculation has not been validated in all clinical situations. eGFR's persistently <60 mL/min signify possible Chronic Kidney Disease.    Anion gap 7 5 - 15  Lactic acid, plasma     Status: None   Collection Time: 08/20/15 11:26 PM  Result Value Ref Range   Lactic Acid, Venous 2.0 0.5 - 2.0 mmol/L  Blood gas, arterial     Status: Abnormal   Collection Time: 08/20/15 11:50 PM  Result Value Ref Range   FIO2 40.00    Delivery systems VENTILATOR    Mode PRESSURE REGULATED VOLUME CONTROL    VT 500.0 mL   LHR 20.0 resp/min   Peep/cpap 5.0 cm H20   pH, Arterial 7.506 (H) 7.350 - 7.450   pCO2 arterial 28.4 (L) 35.0 - 45.0 mmHg   pO2, Arterial 151.0 (H) 80.0 - 100.0  mmHg   Bicarbonate 24.8 (H) 20.0 - 24.0 mEq/L   TCO2 16.6 0 - 100 mmol/L   Acid-Base Excess 0.5 0.0 - 2.0 mmol/L   O2 Saturation 99.0 %   Patient temperature 37.0    Collection site LEFT RADIAL    Drawn by [id number]    Sample type ARTERIAL    Allens test (pass/fail) PASS PASS  Glucose, capillary     Status: Abnormal   Collection Time: 08/21/15 12:18 AM  Result Value Ref Range   Glucose-Capillary 400 (H) 65 - 99 mg/dL   Comment 1 Notify RN    Glucose, capillary     Status: Abnormal   Collection Time: 08/21/15  1:27 AM  Result Value Ref Range   Glucose-Capillary 206 (H) 65 - 99 mg/dL   Comment 1 Notify RN   Basic metabolic panel     Status: Abnormal   Collection Time: 08/21/15  2:39 AM  Result Value Ref Range   Sodium 137 135 - 145 mmol/L   Potassium 3.2 (L) 3.5 - 5.1 mmol/L   Chloride 107 101 - 111 mmol/L   CO2 23 22 - 32 mmol/L   Glucose, Bld 95 65 - 99 mg/dL   BUN 14 6 - 20 mg/dL   Creatinine, Ser 1.08 (H) 0.44 - 1.00 mg/dL   Calcium 8.3 (L) 8.9 - 10.3 mg/dL   GFR calc non Af Amer 55 (L) >60 mL/min   GFR calc Af Amer >60 >60 mL/min    Comment: (NOTE) The eGFR has been calculated using the CKD EPI equation. This calculation has not been validated in all clinical situations. eGFR's persistently <60 mL/min signify possible Chronic Kidney Disease.    Anion gap 7 5 - 15  CBC     Status: Abnormal   Collection Time: 08/21/15  2:39 AM  Result Value Ref Range   WBC 9.3 4.0 - 10.5 K/uL   RBC 3.73 (L) 3.87 - 5.11 MIL/uL   Hemoglobin 11.2 (L) 12.0 - 15.0 g/dL   HCT 33.4 (L) 36.0 - 46.0 %   MCV 89.5 78.0 - 100.0 fL   MCH 30.0 26.0 - 34.0 pg   MCHC 33.5 30.0 - 36.0 g/dL   RDW 13.2 11.5 - 15.5 %   Platelets 159 150 - 400 K/uL  Lactic acid, plasma     Status: Abnormal   Collection Time: 08/21/15  2:39 AM  Result Value Ref Range   Lactic Acid, Venous 2.7 (HH) 0.5 - 2.0 mmol/L    Comment: CRITICAL RESULT CALLED TO, READ BACK BY AND VERIFIED WITH: MIZE J AT 0352 ON [id number] BY FORSYTH K   Hemoglobin A1c     Status: None   Collection Time: 08/21/15  2:39 AM  Result Value Ref Range   Hgb A1c MFr Bld 5.3 4.8 - 5.6 %    Comment: (NOTE)         Pre-diabetes: 5.7 - 6.4         Diabetes: >6.4         Glycemic control for adults with diabetes: <7.0    Mean Plasma Glucose 105 mg/dL    Comment: (NOTE) Performed At: [facility] [place], [place] [id number] Lindon Romp MD QQ:[id number]   Troponin  I (q 6hr x 3)     Status: Abnormal   Collection Time: 08/21/15  2:39 AM  Result Value Ref Range   Troponin I 0.52 (HH) <0.031 ng/mL    Comment:  POSSIBLE MYOCARDIAL ISCHEMIA. SERIAL TESTING RECOMMENDED. DELTA CHECK NOTED CRITICAL RESULT CALLED TO, READ BACK BY AND VERIFIED WITH: Bethann Humble AT 0356 ON [id number] BY FORSYTH K   Glucose, capillary     Status: None   Collection Time: 08/21/15  4:34 AM  Result Value Ref Range   Glucose-Capillary 71 65 - 99 mg/dL  Glucose, capillary     Status: None   Collection Time: 08/21/15  6:18 AM  Result Value Ref Range   Glucose-Capillary 87 65 - 99 mg/dL  Basic metabolic panel     Status: Abnormal   Collection Time: 08/21/15  7:31 AM  Result Value Ref Range   Sodium 137 135 - 145 mmol/L   Potassium 3.0 (L) 3.5 - 5.1 mmol/L   Chloride 108 101 - 111 mmol/L   CO2 24 22 - 32 mmol/L   Glucose, Bld 104 (H) 65 - 99 mg/dL   BUN 13 6 - 20 mg/dL   Creatinine, Ser 0.95 0.44 - 1.00 mg/dL   Calcium 8.4 (L) 8.9 - 10.3 mg/dL   GFR calc non Af Amer >60 >60 mL/min   GFR calc Af Amer >60 >60 mL/min    Comment: (NOTE) The eGFR has been calculated using the CKD EPI equation. This calculation has not been validated in all clinical situations. eGFR's persistently <60 mL/min signify possible Chronic Kidney Disease.    Anion gap 5 5 - 15  Troponin I (q 6hr x 3)     Status: Abnormal   Collection Time: 08/21/15  7:31 AM  Result Value Ref Range   Troponin I 0.39 (H) <0.031 ng/mL    Comment:        PERSISTENTLY INCREASED TROPONIN VALUES IN THE RANGE OF 0.04-0.49 ng/mL CAN BE SEEN IN:       -UNSTABLE ANGINA       -CONGESTIVE HEART FAILURE       -MYOCARDITIS       -CHEST TRAUMA       -ARRYHTHMIAS       -LATE PRESENTING MYOCARDIAL INFARCTION       -COPD   CLINICAL FOLLOW-UP RECOMMENDED.   Glucose, capillary     Status: Abnormal   Collection Time: 08/21/15  7:41 AM  Result Value Ref Range   Glucose-Capillary 107 (H) 65 - 99 mg/dL   Comment 1 Notify RN     Comment 2 Document in Chart   Basic metabolic panel     Status: Abnormal   Collection Time: 08/21/15 10:41 AM  Result Value Ref Range   Sodium 136 135 - 145 mmol/L   Potassium 3.1 (L) 3.5 - 5.1 mmol/L   Chloride 107 101 - 111 mmol/L   CO2 23 22 - 32 mmol/L   Glucose, Bld 159 (H) 65 - 99 mg/dL   BUN 11 6 - 20 mg/dL   Creatinine, Ser 0.95 0.44 - 1.00 mg/dL   Calcium 8.2 (L) 8.9 - 10.3 mg/dL   GFR calc non Af Amer >60 >60 mL/min   GFR calc Af Amer >60 >60 mL/min    Comment: (NOTE) The eGFR has been calculated using the CKD EPI equation. This calculation has not been validated in all clinical situations. eGFR's persistently <60 mL/min signify possible Chronic Kidney Disease.    Anion gap 6 5 - 15  Troponin I (q 6hr x 3)     Status: Abnormal   Collection Time: 08/21/15 10:41 AM  Result Value Ref Range   Troponin I 0.34 (H) <0.031 ng/mL  Comment:        PERSISTENTLY INCREASED TROPONIN VALUES IN THE RANGE OF 0.04-0.49 ng/mL CAN BE SEEN IN:       -UNSTABLE ANGINA       -CONGESTIVE HEART FAILURE       -MYOCARDITIS       -CHEST TRAUMA       -ARRYHTHMIAS       -LATE PRESENTING MYOCARDIAL INFARCTION       -COPD   CLINICAL FOLLOW-UP RECOMMENDED.   Glucose, capillary     Status: Abnormal   Collection Time: 08/21/15 11:28 AM  Result Value Ref Range   Glucose-Capillary 184 (H) 65 - 99 mg/dL   Comment 1 Notify RN    Comment 2 Document in Chart   Occult blood gastric / duodenum     Status: Abnormal   Collection Time: 08/21/15  2:13 PM  Result Value Ref Range   pH, Gastric 3    Occult Blood, Gastric POSITIVE (A) NEGATIVE  Basic metabolic panel     Status: Abnormal   Collection Time: 08/21/15  3:11 PM  Result Value Ref Range   Sodium 134 (L) 135 - 145 mmol/L   Potassium 3.7 3.5 - 5.1 mmol/L   Chloride 106 101 - 111 mmol/L   CO2 22 22 - 32 mmol/L   Glucose, Bld 192 (H) 65 - 99 mg/dL   BUN 10 6 - 20 mg/dL   Creatinine, Ser 0.91 0.44 - 1.00 mg/dL   Calcium 8.5 (L) 8.9 - 10.3  mg/dL   GFR calc non Af Amer >60 >60 mL/min   GFR calc Af Amer >60 >60 mL/min    Comment: (NOTE) The eGFR has been calculated using the CKD EPI equation. This calculation has not been validated in all clinical situations. eGFR's persistently <60 mL/min signify possible Chronic Kidney Disease.    Anion gap 6 5 - 15  Troponin I (q 6hr x 3)     Status: Abnormal   Collection Time: 08/21/15  3:11 PM  Result Value Ref Range   Troponin I 0.53 (HH) <0.031 ng/mL    Comment:        POSSIBLE MYOCARDIAL ISCHEMIA. SERIAL TESTING RECOMMENDED. CRITICAL RESULT CALLED TO, READ BACK BY AND VERIFIED WITH: SCHONEWITZ,L ON 08/21/15 AT 1755 BY LOY,C   CBC     Status: Abnormal   Collection Time: 08/21/15  3:11 PM  Result Value Ref Range   WBC 9.3 4.0 - 10.5 K/uL   RBC 3.87 3.87 - 5.11 MIL/uL   Hemoglobin 11.6 (L) 12.0 - 15.0 g/dL   HCT 34.3 (L) 36.0 - 46.0 %   MCV 88.6 78.0 - 100.0 fL   MCH 30.0 26.0 - 34.0 pg   MCHC 33.8 30.0 - 36.0 g/dL   RDW 13.4 11.5 - 15.5 %   Platelets 130 (L) 150 - 400 K/uL  Glucose, capillary     Status: Abnormal   Collection Time: 08/21/15  4:59 PM  Result Value Ref Range   Glucose-Capillary 201 (H) 65 - 99 mg/dL   Comment 1 Notify RN    Comment 2 Document in Chart   Basic metabolic panel     Status: Abnormal   Collection Time: 08/21/15  6:49 PM  Result Value Ref Range   Sodium 133 (L) 135 - 145 mmol/L   Potassium 3.2 (L) 3.5 - 5.1 mmol/L   Chloride 107 101 - 111 mmol/L   CO2 20 (L) 22 - 32 mmol/L   Glucose, Bld 179 (H) 65 -  99 mg/dL   BUN 10 6 - 20 mg/dL   Creatinine, Ser 2.71 0.44 - 1.00 mg/dL   Calcium 8.4 (L) 8.9 - 10.3 mg/dL   GFR calc non Af Amer >60 >60 mL/min   GFR calc Af Amer >60 >60 mL/min    Comment: (NOTE) The eGFR has been calculated using the CKD EPI equation. This calculation has not been validated in all clinical situations. eGFR's persistently <60 mL/min signify possible Chronic Kidney Disease.    Anion gap 6 5 - 15  Glucose, capillary      Status: Abnormal   Collection Time: 08/21/15  8:39 PM  Result Value Ref Range   Glucose-Capillary 157 (H) 65 - 99 mg/dL   Comment 1 Notify RN   Troponin I (q 6hr x 3)     Status: Abnormal   Collection Time: 08/21/15 10:36 PM  Result Value Ref Range   Troponin I 0.35 (H) <0.031 ng/mL    Comment:        PERSISTENTLY INCREASED TROPONIN VALUES IN THE RANGE OF 0.04-0.49 ng/mL CAN BE SEEN IN:       -UNSTABLE ANGINA       -CONGESTIVE HEART FAILURE       -MYOCARDITIS       -CHEST TRAUMA       -ARRYHTHMIAS       -LATE PRESENTING MYOCARDIAL INFARCTION       -COPD   CLINICAL FOLLOW-UP RECOMMENDED.   CBC     Status: Abnormal   Collection Time: 08/21/15 10:36 PM  Result Value Ref Range   WBC 8.1 4.0 - 10.5 K/uL   RBC 3.59 (L) 3.87 - 5.11 MIL/uL   Hemoglobin 10.9 (L) 12.0 - 15.0 g/dL   HCT 29.2 (L) 90.9 - 03.0 %   MCV 88.0 78.0 - 100.0 fL   MCH 30.4 26.0 - 34.0 pg   MCHC 34.5 30.0 - 36.0 g/dL   RDW 14.9 96.9 - 24.9 %   Platelets 133 (L) 150 - 400 K/uL  Glucose, capillary     Status: Abnormal   Collection Time: 08/22/15 12:14 AM  Result Value Ref Range   Glucose-Capillary 104 (H) 65 - 99 mg/dL  Glucose, capillary     Status: Abnormal   Collection Time: 08/22/15  3:52 AM  Result Value Ref Range   Glucose-Capillary 214 (H) 65 - 99 mg/dL   Comment 1 Notify RN   Blood gas, arterial     Status: Abnormal   Collection Time: 08/22/15  5:35 AM  Result Value Ref Range   FIO2 40.00    Delivery systems VENTILATOR    Mode PRESSURE REGULATED VOLUME CONTROL    VT 500.0 mL   LHR 20.0 resp/min   Peep/cpap 5.0 cm H20   pH, Arterial 7.568 (H) 7.350 - 7.450   pCO2 arterial 23.5 (L) 35.0 - 45.0 mmHg   pO2, Arterial 177.0 (H) 80.0 - 100.0 mmHg   Bicarbonate 25.0 (H) 20.0 - 24.0 mEq/L   TCO2 15.4 0 - 100 mmol/L   Acid-Base Excess 0.6 0.0 - 2.0 mmol/L   O2 Saturation 99.2 %   Patient temperature 37.0    Collection site LEFT RADIAL    Drawn by [id number]    Sample type ARTERIAL    Allens test  (pass/fail) PASS PASS    ABGS  Recent Labs  08/22/15 0535  PHART 7.568*  PO2ART 177.0*  TCO2 15.4  HCO3 25.0*   CULTURES Recent Results (from the past 240 hour(s))  CSF culture  Status: None (Preliminary result)   Collection Time: 08/20/15  6:00 PM  Result Value Ref Range Status   Specimen Description CSF  Final   Special Requests NONE  Final   Gram Stain   Final    CYTOSPIN SMEAR WBC PRESENT,BOTH PMN AND MONONUCLEAR NO ORGANISMS SEEN Performed at [facility]    Culture PENDING  Incomplete   Report Status PENDING  Incomplete  Gram stain     Status: None   Collection Time: 08/20/15  6:50 PM  Result Value Ref Range Status   Specimen Description CSF  Final   Special Requests NONE  Final   Gram Stain   Final    CYTOSPIN SMEAR NO ORGANISMS SEEN WBC PRESENT,BOTH PMN AND MONONUCLEAR Performed at [facility]   Report Status 08/20/2015 FINAL  Final  Blood Culture (routine x 2)     Status: None (Preliminary result)   Collection Time: 08/20/15  7:15 PM  Result Value Ref Range Status   Specimen Description BLOOD RIGHT HAND  Final   Special Requests BOTTLES DRAWN AEROBIC AND ANAEROBIC 6CC  Final   Culture NO GROWTH < 24 HOURS  Final   Report Status PENDING  Incomplete  Blood Culture (routine x 2)     Status: None (Preliminary result)   Collection Time: 08/20/15  7:22 PM  Result Value Ref Range Status   Specimen Description BLOOD RIGHT HAND  Final   Special Requests BOTTLES DRAWN AEROBIC AND ANAEROBIC 6CC  Final   Culture NO GROWTH < 24 HOURS  Final   Report Status PENDING  Incomplete  MRSA PCR Screening     Status: None   Collection Time: 08/20/15  9:25 PM  Result Value Ref Range Status   MRSA by PCR NEGATIVE NEGATIVE Final    Comment:        The GeneXpert MRSA Assay (FDA approved for NASAL specimens only), is one component of a comprehensive MRSA colonization surveillance program. It is not intended to diagnose MRSA infection nor to guide or monitor  treatment for MRSA infections.    Studies/Results: Ct Head Wo Contrast  08/20/2015  CLINICAL DATA:  Seizure today. EXAM: CT HEAD WITHOUT CONTRAST TECHNIQUE: Contiguous axial images were obtained from the base of the skull through the vertex without intravenous contrast. COMPARISON:  05/31/2014 FINDINGS: A small left frontal scalp hematoma is noted but no underlying skull fracture. The ventricles are in the midline without mass effect or shift. They are normal in size and configuration. No extra-axial fluid collections are identified. No findings for hemispheric infarction an or intracranial hemorrhage. No mass lesion. The brainstem and cerebellum are grossly normal. IMPRESSION: No acute intracranial findings or skull fracture. No change since prior study. Electronically Signed   By: Marijo Sanes M.D.   On: 08/20/2015 18:42   US Carotid Bilateral  08/21/2015  CLINICAL DATA:  Seizures and altered mental status EXAM: BILATERAL CAROTID DUPLEX ULTRASOUND TECHNIQUE: Pearline Cables scale imaging, color Doppler and duplex ultrasound were performed of bilateral carotid and vertebral arteries in the neck. COMPARISON:  None. FINDINGS: Criteria: Quantification of carotid stenosis is based on velocity parameters that correlate the residual internal carotid diameter with NASCET-based stenosis levels, using the diameter of the distal internal carotid lumen as the denominator for stenosis measurement. The following velocity measurements were obtained: RIGHT ICA:  148 cm/sec CCA:  694 cm/sec SYSTOLIC ICA/CCA RATIO:  1.2 DIASTOLIC ICA/CCA RATIO:  1.2 ECA:  101 cm/sec LEFT ICA:  159 cm/sec CCA:  854 cm/sec SYSTOLIC  ICA/CCA RATIO:  1.2 DIASTOLIC ICA/CCA RATIO:  1.1 ECA:  135 cm/sec RIGHT CAROTID ARTERY: Moderate heterogeneous soft plaque in the bulb. Low resistance internal carotid Doppler pattern. RIGHT VERTEBRAL ARTERY:  Antegrade. LEFT CAROTID ARTERY: Moderate mixed plaque in the bulb. Low resistance internal carotid Doppler pattern.  LEFT VERTEBRAL ARTERY:  Antegrade. IMPRESSION: 50-69% stenosis in the right and left internal carotid arteries. Electronically Signed   By: Marybelle Killings M.D.   On: 08/21/2015 10:37   Dg Chest Port 1 View  08/21/2015  CLINICAL DATA:  Respiratory failure EXAM: PORTABLE CHEST 1 VIEW COMPARISON:  Chest radiograph from one day prior. FINDINGS: Endotracheal tube tip is 4.3 cm above the carina. Enteric tube enters stomach with the tip not seen on this image. Stable cardiomediastinal silhouette with normal heart size. No pneumothorax. No pleural effusion. Lungs appear clear, with no acute consolidative airspace disease and no pulmonary edema. IMPRESSION: Well-positioned endotracheal and enteric tubes. No active cardiopulmonary disease. Electronically Signed   By: Ilona Sorrel M.D.   On: 08/21/2015 09:08   Dg Chest Port 1 View  08/20/2015  CLINICAL DATA:  Check endotracheal tube placement EXAM: PORTABLE CHEST 1 VIEW COMPARISON:  05/21/2015 FINDINGS: Cardiac shadow is stable. An endotracheal tube is now seen 5.8 cm above the carina. A nasogastric catheter is seen within the stomach. The lungs are well-aerated without focal infiltrate or sizable effusion. No acute bony abnormality is seen. IMPRESSION: Tubes and lines in satisfactory position. No acute abnormality noted. Electronically Signed   By: Inez Catalina M.D.   On: 08/20/2015 18:18    Medications:  Prior to Admission:  Prescriptions prior to admission  Medication Sig Dispense Refill Last Dose  . acetaminophen (TYLENOL) 500 MG tablet Take 1,000 mg by mouth every 6 (six) hours as needed for mild pain or moderate pain.   unknown  . ALPRAZolam (XANAX) 1 MG tablet Take 1 mg by mouth 3 (three) times daily as needed. For anxiety   08/20/2015 at Unknown time  . aspirin EC 81 MG tablet Take 81 mg by mouth daily.   08/20/2015 at Unknown time  . cyclobenzaprine (FLEXERIL) 10 MG tablet Take 1 tablet (10 mg total) by mouth 3 (three) times daily as needed for muscle spasms.  20 tablet 0 unknown  . insulin glargine (LANTUS) 100 UNIT/ML injection Inject 22 Units into the skin every morning.    08/20/2015 at Unknown time  . NOVOLOG FLEXPEN 100 UNIT/ML FlexPen Inject 2-14 Units into the skin 3 (three) times daily before meals.   12 08/20/2015 at Unknown time  . RESTASIS 0.05 % ophthalmic emulsion Apply 1 drop to eye 2 (two) times daily.  3 unknown at Unknown time  . sertraline (ZOLOFT) 50 MG tablet TK 1 T PO QD  12 08/19/2015 at Unknown time  . cephALEXin (KEFLEX) 500 MG capsule Take 1 capsule (500 mg total) by mouth 3 (three) times daily. (Patient not taking: Reported on 08/20/2015) 15 capsule 0    Scheduled: . antiseptic oral rinse  7 mL Mouth Rinse QID  . aspirin  300 mg Rectal Daily  . chlorhexidine gluconate (SAGE KIT)  15 mL Mouth Rinse BID  . famotidine (PEPCID) IV  20 mg Intravenous Q12H  . heparin  5,000 Units Subcutaneous Q8H  . insulin aspart  0-9 Units Subcutaneous Q4H  . levETIRAcetam  500 mg Intravenous Q12H   Continuous: . dextrose 5 % and 0.45% NaCl 75 mL/hr (08/21/15 1717)  . propofol (DIPRIVAN) infusion 45 mcg/kg/min (08/22/15 0120)  NMM:HWKGSU chloride, acetaminophen **OR** acetaminophen (TYLENOL) oral liquid 160 mg/5 mL **OR** acetaminophen, etomidate, fentaNYL (SUBLIMAZE) injection, midazolam, rocuronium  Assesment: She came in with status epilepticus. She was placed on mechanical ventilation for airway protection. She's not had any more seizures. Studies thus far do not show a definitive cause of her seizures. Although it's a possibility that this is related to her Zoloft when I discussed it further with her family she had been on Zoloft for a period of several years in the past and had been on it for about 2 months when she started having the seizures. However we don't have any other definitive cause either. MRI may be helpful when we can obtain it  When she first came into the hospital she had elevated blood sugar and she was acidemic and she was  started on treatment for diabetic ketoacidosis. This may actually have been a lactic acidemia from her multiple seizures but any rate she's better. Her blood sugars are much improved.  She does have some element of COPD but it is not severe and she stopped smoking several years ago  She has some narrowing of her carotids bilaterally but not at a point that we need to do anything other than follow.  She had a lot of nasogastric drainage and it was dark but her hemoglobin level is stable so I don't think she is having any sort of GI bleeding  She has elevated troponin that I think is not related to a cardiac event Active Problems:   Diabetes (HCC)   COPD (chronic obstructive pulmonary disease) (HCC)   Status epilepticus (HCC)   Seizure (HCC)   DKA (diabetic ketoacidoses) ([place])    Plan: Attempt extubation today. She needs MRI if she is able to come off the ventilator and is stable.    LOS: 2 days   Lauria Depoy L 08/22/2015, 7:30 AM

## 2015-08-23 ENCOUNTER — Inpatient Hospital Stay (HOSPITAL_COMMUNITY): Payer: Medicaid Other

## 2015-08-23 LAB — RMSF, IGG, IFA

## 2015-08-23 LAB — HSV CULTURE AND TYPING

## 2015-08-23 LAB — ROCKY MTN SPOTTED FVR ABS PNL(IGG+IGM)
RMSF IGM: 1.35 {index} — AB (ref 0.00–0.89)
RMSF IgG: POSITIVE — AB

## 2015-08-23 LAB — GLUCOSE, CAPILLARY
GLUCOSE-CAPILLARY: 240 mg/dL — AB (ref 65–99)
GLUCOSE-CAPILLARY: 94 mg/dL (ref 65–99)
GLUCOSE-CAPILLARY: 312 mg/dL — AB (ref 65–99)
GLUCOSE-CAPILLARY: 404 mg/dL — AB (ref 65–99)
Glucose-Capillary: 327 mg/dL — ABNORMAL HIGH (ref 65–99)
Glucose-Capillary: 353 mg/dL — ABNORMAL HIGH (ref 65–99)

## 2015-08-23 MED ORDER — PROMETHAZINE HCL 12.5 MG PO TABS
25.0000 mg | ORAL_TABLET | Freq: Four times a day (QID) | ORAL | Status: DC | PRN
  Filled 2015-08-23: qty 2

## 2015-08-23 MED ORDER — ALBUTEROL SULFATE (2.5 MG/3ML) 0.083% IN NEBU
2.5000 mg | INHALATION_SOLUTION | RESPIRATORY_TRACT | Status: DC | PRN
  Administered 2015-08-23: 2.5 mg via RESPIRATORY_TRACT
  Filled 2015-08-23: qty 3

## 2015-08-23 MED ORDER — ONDANSETRON HCL 4 MG/2ML IJ SOLN
4.0000 mg | Freq: Four times a day (QID) | INTRAMUSCULAR | Status: DC | PRN
  Administered 2015-08-23 (×2): 4 mg via INTRAVENOUS
  Filled 2015-08-23 (×2): qty 2

## 2015-08-23 MED ORDER — GADOBENATE DIMEGLUMINE 529 MG/ML IV SOLN
15.0000 mL | Freq: Once | INTRAVENOUS | Status: AC | PRN
  Administered 2015-08-23: 11 mL via INTRAVENOUS

## 2015-08-23 MED ORDER — PHENOL 1.4 % MT LIQD
1.0000 | OROMUCOSAL | Status: DC | PRN
  Administered 2015-08-23: 1 via OROMUCOSAL
  Filled 2015-08-23: qty 177

## 2015-08-23 NOTE — Progress Notes (Signed)
Results for NAYVEE, GINDER (MRN [id number]) as of 08/23/2015 14:28  Ref. Range 08/23/2015 00:21 08/23/2015 03:47 08/23/2015 07:49 08/23/2015 12:14 08/23/2015 13:33  Glucose-Capillary Latest Ref Range: 65-99 mg/dL 327 (H) 240 (H) 94 312 (H) 404 (H)  Blood sugars continue to be greater than 180 mg/dl.  Recommend starting Lantus 15-20 units daily and increase Novolog to MODERATE TID & HS.  (Patient takes Lantus 22 units daily and novolog 2-14 units TID at home) Will continue to monitor blood sugars while in the hospital. Harvel Ricks RN BSN CDE

## 2015-08-23 NOTE — Progress Notes (Signed)
Subjective: She feels much better. She was able to be extubated yesterday. She had poor cough effort yesterday but this morning is much more alert although she is still a little sluggish potentially from the IV medications for seizures. Her husband gives me additional history today that in childhood she fell on a trampoline and had a head injury and had seizures for about a year or year and a half after that. They eventually went away and she had not had any until recently. Zoloft was reinitiated on April 30. She complains of a headache. EEG was generally nonspecific  Objective: Vital signs in last 24 hours: Temp:  [98.1 F (36.7 C)-100.3 F (37.9 C)] 98.6 F (37 C) (06/08 0400) Pulse Rate:  [66-117] 76 (06/08 0600) Resp:  [11-28] 22 (06/08 0600) BP: (101-147)/(39-56) 107/51 mmHg (06/08 0600) SpO2:  [96 %-100 %] 98 % (06/08 0600) FiO2 (%):  [32 %-40 %] 32 % (06/07 1600) Weight:  [55.6 kg (122 lb 9.2 oz)] 55.6 kg (122 lb 9.2 oz) (06/08 0500) Weight change: 0 kg (0 lb) Last BM Date: 08/20/15  Intake/Output from previous day: 06/07 0701 - 06/08 0700 In: 3053.8 [I.V.:2753.8; IV Piggyback:300] Out: 1500 [Urine:1500]  PHYSICAL EXAM General appearance: alert, cooperative and no distress Resp: clear to auscultation bilaterally Cardio: regular rate and rhythm, S1, S2 normal, no murmur, click, rub or gallop GI: soft, non-tender; bowel sounds normal; no masses,  no organomegaly Extremities: extremities normal, atraumatic, no cyanosis or edema  Lab Results:  Results for orders placed or performed during the hospital encounter of 08/20/15 (from the past 48 hour(s))  Glucose, capillary     Status: Abnormal   Collection Time: 08/21/15  7:41 AM  Result Value Ref Range   Glucose-Capillary 107 (H) 65 - 99 mg/dL   Comment 1 Notify RN    Comment 2 Document in Chart   Basic metabolic panel     Status: Abnormal   Collection Time: 08/21/15 10:41 AM  Result Value Ref Range   Sodium 136 135 - 145  mmol/L   Potassium 3.1 (L) 3.5 - 5.1 mmol/L   Chloride 107 101 - 111 mmol/L   CO2 23 22 - 32 mmol/L   Glucose, Bld 159 (H) 65 - 99 mg/dL   BUN 11 6 - 20 mg/dL   Creatinine, Ser 0.95 0.44 - 1.00 mg/dL   Calcium 8.2 (L) 8.9 - 10.3 mg/dL   GFR calc non Af Amer >60 >60 mL/min   GFR calc Af Amer >60 >60 mL/min    Comment: (NOTE) The eGFR has been calculated using the CKD EPI equation. This calculation has not been validated in all clinical situations. eGFR's persistently <60 mL/min signify possible Chronic Kidney Disease.    Anion gap 6 5 - 15  Troponin I (q 6hr x 3)     Status: Abnormal   Collection Time: 08/21/15 10:41 AM  Result Value Ref Range   Troponin I 0.34 (H) <0.031 ng/mL    Comment:        PERSISTENTLY INCREASED TROPONIN VALUES IN THE RANGE OF 0.04-0.49 ng/mL CAN BE SEEN IN:       -UNSTABLE ANGINA       -CONGESTIVE HEART FAILURE       -MYOCARDITIS       -CHEST TRAUMA       -ARRYHTHMIAS       -LATE PRESENTING MYOCARDIAL INFARCTION       -COPD   CLINICAL FOLLOW-UP RECOMMENDED.   B. burgdorfi antibodies  Status: None   Collection Time: 08/21/15 10:41 AM  Result Value Ref Range   B burgdorferi Ab IgG+IgM <0.91 0.00 - 0.90 ISR    Comment: (NOTE)                                Negative         <0.91                                Equivocal  0.91 - 1.09                                Positive         >1.09 Performed At: [facility] [place], [place] [id number] Lindon Romp MD RK:[id number]   Glucose, capillary     Status: Abnormal   Collection Time: 08/21/15 11:28 AM  Result Value Ref Range   Glucose-Capillary 184 (H) 65 - 99 mg/dL   Comment 1 Notify RN    Comment 2 Document in Chart   Occult blood gastric / duodenum     Status: Abnormal   Collection Time: 08/21/15  2:13 PM  Result Value Ref Range   pH, Gastric 3    Occult Blood, Gastric POSITIVE (A) NEGATIVE  Basic metabolic panel     Status: Abnormal   Collection Time: 08/21/15   3:11 PM  Result Value Ref Range   Sodium 134 (L) 135 - 145 mmol/L   Potassium 3.7 3.5 - 5.1 mmol/L   Chloride 106 101 - 111 mmol/L   CO2 22 22 - 32 mmol/L   Glucose, Bld 192 (H) 65 - 99 mg/dL   BUN 10 6 - 20 mg/dL   Creatinine, Ser 0.91 0.44 - 1.00 mg/dL   Calcium 8.5 (L) 8.9 - 10.3 mg/dL   GFR calc non Af Amer >60 >60 mL/min   GFR calc Af Amer >60 >60 mL/min    Comment: (NOTE) The eGFR has been calculated using the CKD EPI equation. This calculation has not been validated in all clinical situations. eGFR's persistently <60 mL/min signify possible Chronic Kidney Disease.    Anion gap 6 5 - 15  Troponin I (q 6hr x 3)     Status: Abnormal   Collection Time: 08/21/15  3:11 PM  Result Value Ref Range   Troponin I 0.53 (HH) <0.031 ng/mL    Comment:        POSSIBLE MYOCARDIAL ISCHEMIA. SERIAL TESTING RECOMMENDED. CRITICAL RESULT CALLED TO, READ BACK BY AND VERIFIED WITH: SCHONEWITZ,L ON 08/21/15 AT 1755 BY LOY,C   CBC     Status: Abnormal   Collection Time: 08/21/15  3:11 PM  Result Value Ref Range   WBC 9.3 4.0 - 10.5 K/uL   RBC 3.87 3.87 - 5.11 MIL/uL   Hemoglobin 11.6 (L) 12.0 - 15.0 g/dL   HCT 34.3 (L) 36.0 - 46.0 %   MCV 88.6 78.0 - 100.0 fL   MCH 30.0 26.0 - 34.0 pg   MCHC 33.8 30.0 - 36.0 g/dL   RDW 13.4 11.5 - 15.5 %   Platelets 130 (L) 150 - 400 K/uL  Glucose, capillary     Status: Abnormal   Collection Time: 08/21/15  4:59 PM  Result Value Ref Range   Glucose-Capillary 201 (H) 65 - 99 mg/dL  Comment 1 Notify RN    Comment 2 Document in Chart   Basic metabolic panel     Status: Abnormal   Collection Time: 08/21/15  6:49 PM  Result Value Ref Range   Sodium 133 (L) 135 - 145 mmol/L   Potassium 3.2 (L) 3.5 - 5.1 mmol/L   Chloride 107 101 - 111 mmol/L   CO2 20 (L) 22 - 32 mmol/L   Glucose, Bld 179 (H) 65 - 99 mg/dL   BUN 10 6 - 20 mg/dL   Creatinine, Ser 0.93 0.44 - 1.00 mg/dL   Calcium 8.4 (L) 8.9 - 10.3 mg/dL   GFR calc non Af Amer >60 >60 mL/min   GFR calc  Af Amer >60 >60 mL/min    Comment: (NOTE) The eGFR has been calculated using the CKD EPI equation. This calculation has not been validated in all clinical situations. eGFR's persistently <60 mL/min signify possible Chronic Kidney Disease.    Anion gap 6 5 - 15  Glucose, capillary     Status: Abnormal   Collection Time: 08/21/15  8:39 PM  Result Value Ref Range   Glucose-Capillary 157 (H) 65 - 99 mg/dL   Comment 1 Notify RN   Troponin I (q 6hr x 3)     Status: Abnormal   Collection Time: 08/21/15 10:36 PM  Result Value Ref Range   Troponin I 0.35 (H) <0.031 ng/mL    Comment:        PERSISTENTLY INCREASED TROPONIN VALUES IN THE RANGE OF 0.04-0.49 ng/mL CAN BE SEEN IN:       -UNSTABLE ANGINA       -CONGESTIVE HEART FAILURE       -MYOCARDITIS       -CHEST TRAUMA       -ARRYHTHMIAS       -LATE PRESENTING MYOCARDIAL INFARCTION       -COPD   CLINICAL FOLLOW-UP RECOMMENDED.   CBC     Status: Abnormal   Collection Time: 08/21/15 10:36 PM  Result Value Ref Range   WBC 8.1 4.0 - 10.5 K/uL   RBC 3.59 (L) 3.87 - 5.11 MIL/uL   Hemoglobin 10.9 (L) 12.0 - 15.0 g/dL   HCT 31.6 (L) 36.0 - 46.0 %   MCV 88.0 78.0 - 100.0 fL   MCH 30.4 26.0 - 34.0 pg   MCHC 34.5 30.0 - 36.0 g/dL   RDW 13.5 11.5 - 15.5 %   Platelets 133 (L) 150 - 400 K/uL  Glucose, capillary     Status: Abnormal   Collection Time: 08/22/15 12:14 AM  Result Value Ref Range   Glucose-Capillary 104 (H) 65 - 99 mg/dL  Glucose, capillary     Status: Abnormal   Collection Time: 08/22/15  3:52 AM  Result Value Ref Range   Glucose-Capillary 214 (H) 65 - 99 mg/dL   Comment 1 Notify RN   Blood gas, arterial     Status: Abnormal   Collection Time: 08/22/15  5:35 AM  Result Value Ref Range   FIO2 40.00    Delivery systems VENTILATOR    Mode PRESSURE REGULATED VOLUME CONTROL    VT 500.0 mL   LHR 20.0 resp/min   Peep/cpap 5.0 cm H20   pH, Arterial 7.568 (H) 7.350 - 7.450   pCO2 arterial 23.5 (L) 35.0 - 45.0 mmHg   pO2,  Arterial 177.0 (H) 80.0 - 100.0 mmHg   Bicarbonate 25.0 (H) 20.0 - 24.0 mEq/L   TCO2 15.4 0 - 100 mmol/L   Acid-Base  Excess 0.6 0.0 - 2.0 mmol/L   O2 Saturation 99.2 %   Patient temperature 37.0    Collection site LEFT RADIAL    Drawn by [id number]    Sample type ARTERIAL    Allens test (pass/fail) PASS PASS  Basic metabolic panel     Status: Abnormal   Collection Time: 08/22/15  7:37 AM  Result Value Ref Range   Sodium 138 135 - 145 mmol/L   Potassium 3.2 (L) 3.5 - 5.1 mmol/L   Chloride 110 101 - 111 mmol/L   CO2 23 22 - 32 mmol/L   Glucose, Bld 187 (H) 65 - 99 mg/dL   BUN 10 6 - 20 mg/dL   Creatinine, Ser 1.03 (H) 0.44 - 1.00 mg/dL   Calcium 8.4 (L) 8.9 - 10.3 mg/dL   GFR calc non Af Amer 58 (L) >60 mL/min   GFR calc Af Amer >60 >60 mL/min    Comment: (NOTE) The eGFR has been calculated using the CKD EPI equation. This calculation has not been validated in all clinical situations. eGFR's persistently <60 mL/min signify possible Chronic Kidney Disease.    Anion gap 5 5 - 15  Glucose, capillary     Status: Abnormal   Collection Time: 08/22/15  8:02 AM  Result Value Ref Range   Glucose-Capillary 204 (H) 65 - 99 mg/dL   Comment 1 Notify RN    Comment 2 Document in Chart   Glucose, capillary     Status: Abnormal   Collection Time: 08/22/15 11:30 AM  Result Value Ref Range   Glucose-Capillary 121 (H) 65 - 99 mg/dL   Comment 1 Notify RN    Comment 2 Document in Chart   Blood gas, arterial     Status: Abnormal   Collection Time: 08/22/15 12:30 PM  Result Value Ref Range   FIO2 40.00    Delivery systems VENTILATOR    Mode CONTINUOUS POSITIVE AIRWAY PRESSURE    Peep/cpap 5.0 cm H20   Pressure support 5 cm H20   pH, Arterial 7.428 7.350 - 7.450   pCO2 arterial 34.0 (L) 35.0 - 45.0 mmHg   pO2, Arterial 194 (H) 80.0 - 100.0 mmHg   Bicarbonate 23.4 20.0 - 24.0 mEq/L   Acid-base deficit 1.7 0.0 - 2.0 mmol/L   O2 Saturation 99.1 %   Patient temperature 37.0    Collection site  LEFT RADIAL    Drawn by [id number]    Sample type ARTERIAL DRAW    Allens test (pass/fail) PASS PASS  Glucose, capillary     Status: Abnormal   Collection Time: 08/22/15  4:32 PM  Result Value Ref Range   Glucose-Capillary 157 (H) 65 - 99 mg/dL   Comment 1 Notify RN    Comment 2 Document in Chart   Glucose, capillary     Status: Abnormal   Collection Time: 08/22/15  7:50 PM  Result Value Ref Range   Glucose-Capillary 198 (H) 65 - 99 mg/dL  Glucose, capillary     Status: Abnormal   Collection Time: 08/23/15 12:21 AM  Result Value Ref Range   Glucose-Capillary 327 (H) 65 - 99 mg/dL  Glucose, capillary     Status: Abnormal   Collection Time: 08/23/15  3:47 AM  Result Value Ref Range   Glucose-Capillary 240 (H) 65 - 99 mg/dL    ABGS  Recent Labs  08/22/15 0535 08/22/15 1230  PHART 7.568* 7.428  PO2ART 177.0* 194*  TCO2 15.4  --   HCO3 25.0* 23.4  CULTURES Recent Results (from the past 240 hour(s))  Urine culture     Status: None   Collection Time: 08/20/15  5:50 PM  Result Value Ref Range Status   Specimen Description URINE, CLEAN CATCH  Final   Special Requests NONE  Final   Culture NO GROWTH Performed at [facility]   Final   Report Status 08/22/2015 FINAL  Final  CSF culture     Status: None (Preliminary result)   Collection Time: 08/20/15  6:00 PM  Result Value Ref Range Status   Specimen Description CSF  Final   Special Requests NONE  Final   Gram Stain   Final    CYTOSPIN SMEAR WBC PRESENT,BOTH PMN AND MONONUCLEAR NO ORGANISMS SEEN Performed at [facility]    Culture   Final    NO GROWTH 2 DAYS Performed at [facility]    Report Status PENDING  Incomplete  Gram stain     Status: None   Collection Time: 08/20/15  6:50 PM  Result Value Ref Range Status   Specimen Description CSF  Final   Special Requests NONE  Final   Gram Stain   Final    CYTOSPIN SMEAR NO ORGANISMS SEEN WBC PRESENT,BOTH PMN AND MONONUCLEAR Performed at [facility]   Report Status 08/20/2015 FINAL  Final  Blood Culture (routine x 2)     Status: None (Preliminary result)   Collection Time: 08/20/15  7:15 PM  Result Value Ref Range Status   Specimen Description BLOOD RIGHT HAND  Final   Special Requests BOTTLES DRAWN AEROBIC AND ANAEROBIC 6CC  Final   Culture NO GROWTH 2 DAYS  Final   Report Status PENDING  Incomplete  Blood Culture (routine x 2)     Status: None (Preliminary result)   Collection Time: 08/20/15  7:22 PM  Result Value Ref Range Status   Specimen Description BLOOD RIGHT HAND  Final   Special Requests BOTTLES DRAWN AEROBIC AND ANAEROBIC 6CC  Final   Culture NO GROWTH 2 DAYS  Final   Report Status PENDING  Incomplete  MRSA PCR Screening     Status: None   Collection Time: 08/20/15  9:25 PM  Result Value Ref Range Status   MRSA by PCR NEGATIVE NEGATIVE Final    Comment:        The GeneXpert MRSA Assay (FDA approved for NASAL specimens only), is one component of a comprehensive MRSA colonization surveillance program. It is not intended to diagnose MRSA infection nor to guide or monitor treatment for MRSA infections.    Studies/Results: US Carotid Bilateral  08/21/2015  CLINICAL DATA:  Seizures and altered mental status EXAM: BILATERAL CAROTID DUPLEX ULTRASOUND TECHNIQUE: Pearline Cables scale imaging, color Doppler and duplex ultrasound were performed of bilateral carotid and vertebral arteries in the neck. COMPARISON:  None. FINDINGS: Criteria: Quantification of carotid stenosis is based on velocity parameters that correlate the residual internal carotid diameter with NASCET-based stenosis levels, using the diameter of the distal internal carotid lumen as the denominator for stenosis measurement. The following velocity measurements were obtained: RIGHT ICA:  148 cm/sec CCA:  419 cm/sec SYSTOLIC ICA/CCA RATIO:  1.2 DIASTOLIC ICA/CCA RATIO:  1.2 ECA:  101 cm/sec LEFT ICA:  159 cm/sec CCA:  379 cm/sec SYSTOLIC ICA/CCA RATIO:  1.2  DIASTOLIC ICA/CCA RATIO:  1.1 ECA:  135 cm/sec RIGHT CAROTID ARTERY: Moderate heterogeneous soft plaque in the bulb. Low resistance internal carotid Doppler pattern. RIGHT VERTEBRAL ARTERY:  Antegrade. LEFT CAROTID ARTERY:  Moderate mixed plaque in the bulb. Low resistance internal carotid Doppler pattern. LEFT VERTEBRAL ARTERY:  Antegrade. IMPRESSION: 50-69% stenosis in the right and left internal carotid arteries. Electronically Signed   By: Marybelle Killings M.D.   On: 08/21/2015 10:37   Dg Chest Port 1 View  08/22/2015  CLINICAL DATA:  Intubated patient, respiratory failure, COPD, DKA, seizure disorder, former smoker. EXAM: PORTABLE CHEST 1 VIEW COMPARISON:  Chest x-ray of August 21, 2015 FINDINGS: The lungs remain well-expanded. There is no infiltrate, pneumothorax, or pleural effusion. The heart is normal in size. The pulmonary vascularity is mildly prominent. The endotracheal tube tip lies 4.1 cm above the carina. The esophagogastric tube tip projects below the inferior margin of the image. The observed bony thorax exhibits no acute abnormality. IMPRESSION: Intubated patient with support tubes in reasonable position. COPD. No definite acute cardiopulmonary abnormality. Electronically Signed   By: David  Martinique M.D.   On: 08/22/2015 07:50   Dg Chest Port 1 View  08/21/2015  CLINICAL DATA:  Respiratory failure EXAM: PORTABLE CHEST 1 VIEW COMPARISON:  Chest radiograph from one day prior. FINDINGS: Endotracheal tube tip is 4.3 cm above the carina. Enteric tube enters stomach with the tip not seen on this image. Stable cardiomediastinal silhouette with normal heart size. No pneumothorax. No pleural effusion. Lungs appear clear, with no acute consolidative airspace disease and no pulmonary edema. IMPRESSION: Well-positioned endotracheal and enteric tubes. No active cardiopulmonary disease. Electronically Signed   By: Ilona Sorrel M.D.   On: 08/21/2015 09:08    Medications:  Prior to Admission:  Prescriptions prior  to admission  Medication Sig Dispense Refill Last Dose  . acetaminophen (TYLENOL) 500 MG tablet Take 1,000 mg by mouth every 6 (six) hours as needed for mild pain or moderate pain.   unknown  . ALPRAZolam (XANAX) 1 MG tablet Take 1 mg by mouth 3 (three) times daily as needed. For anxiety   08/20/2015 at Unknown time  . aspirin EC 81 MG tablet Take 81 mg by mouth daily.   08/20/2015 at Unknown time  . cyclobenzaprine (FLEXERIL) 10 MG tablet Take 1 tablet (10 mg total) by mouth 3 (three) times daily as needed for muscle spasms. 20 tablet 0 unknown  . insulin glargine (LANTUS) 100 UNIT/ML injection Inject 22 Units into the skin every morning.    08/20/2015 at Unknown time  . NOVOLOG FLEXPEN 100 UNIT/ML FlexPen Inject 2-14 Units into the skin 3 (three) times daily before meals.   12 08/20/2015 at Unknown time  . RESTASIS 0.05 % ophthalmic emulsion Apply 1 drop to eye 2 (two) times daily.  3 unknown at Unknown time  . sertraline (ZOLOFT) 50 MG tablet TK 1 T PO QD  12 08/19/2015 at Unknown time  . cephALEXin (KEFLEX) 500 MG capsule Take 1 capsule (500 mg total) by mouth 3 (three) times daily. (Patient not taking: Reported on 08/20/2015) 15 capsule 0    Scheduled: . antiseptic oral rinse  7 mL Mouth Rinse QID  . aspirin  300 mg Rectal Daily  . chlorhexidine gluconate (SAGE KIT)  15 mL Mouth Rinse BID  . cycloSPORINE  1 drop Both Eyes BID  . famotidine (PEPCID) IV  20 mg Intravenous Q12H  . heparin  5,000 Units Subcutaneous Q8H  . insulin aspart  0-9 Units Subcutaneous Q4H  . levETIRAcetam  500 mg Intravenous Q12H   Continuous: . dextrose 5 % and 0.45% NaCl 75 mL/hr at 08/23/15 0600   BPZ:WCHENI chloride, acetaminophen **OR** acetaminophen (TYLENOL)  oral liquid 160 mg/5 mL **OR** acetaminophen, etomidate, fentaNYL (SUBLIMAZE) injection  Assesment: She was admitted with status epilepticus and ended up intubated and on mechanical ventilation because of that. She is on IV Keppra. At baseline she has COPD which  is relatively mild. She stopped smoking several years ago. She was acidemic on admission and was treated for diabetic ketoacidosis but I think it may have been more of a lactic acidemia from her status epilepticus. She had elevated troponin but her echocardiogram is normal so I think that is demand ischemia. It may also be muscle trauma from the seizures. Active Problems:   Diabetes (HCC)   COPD (chronic obstructive pulmonary disease) (HCC)   Status epilepticus (HCC)   Seizure ([place])   DKA (diabetic ketoacidoses) ([place])    Plan: MRI of the brain today. Advance her diet. Discussed switching to oral anticonvulsants with Dr. Merlene Laughter. Avoid SSRI.    LOS: 3 days   Devyon Keator L 08/23/2015, 7:33 AM

## 2015-08-24 LAB — GLUCOSE, CAPILLARY
GLUCOSE-CAPILLARY: 215 mg/dL — AB (ref 65–99)
GLUCOSE-CAPILLARY: 317 mg/dL — AB (ref 65–99)
GLUCOSE-CAPILLARY: 337 mg/dL — AB (ref 65–99)
Glucose-Capillary: 189 mg/dL — ABNORMAL HIGH (ref 65–99)
Glucose-Capillary: 243 mg/dL — ABNORMAL HIGH (ref 65–99)
Glucose-Capillary: 294 mg/dL — ABNORMAL HIGH (ref 65–99)

## 2015-08-24 LAB — CSF CULTURE

## 2015-08-24 LAB — CSF CULTURE W GRAM STAIN: Culture: NO GROWTH

## 2015-08-24 MED ORDER — LEVETIRACETAM 500 MG PO TABS
500.0000 mg | ORAL_TABLET | Freq: Two times a day (BID) | ORAL | Status: DC
  Administered 2015-08-24 – 2015-08-25 (×3): 500 mg via ORAL
  Filled 2015-08-24 (×3): qty 1

## 2015-08-24 MED ORDER — INSULIN GLARGINE 100 UNIT/ML ~~LOC~~ SOLN
18.0000 [IU] | Freq: Every day | SUBCUTANEOUS | Status: DC
  Administered 2015-08-24: 18 [IU] via SUBCUTANEOUS
  Filled 2015-08-24 (×3): qty 0.18

## 2015-08-24 MED ORDER — ASPIRIN 81 MG PO CHEW
81.0000 mg | CHEWABLE_TABLET | Freq: Every day | ORAL | Status: DC
  Administered 2015-08-24 – 2015-08-25 (×2): 81 mg via ORAL
  Filled 2015-08-24 (×2): qty 1

## 2015-08-24 MED ORDER — INSULIN ASPART 100 UNIT/ML ~~LOC~~ SOLN
0.0000 [IU] | Freq: Three times a day (TID) | SUBCUTANEOUS | Status: DC
  Administered 2015-08-24: 5 [IU] via SUBCUTANEOUS
  Administered 2015-08-24: 11 [IU] via SUBCUTANEOUS
  Administered 2015-08-24: 8 [IU] via SUBCUTANEOUS
  Administered 2015-08-25: 3 [IU] via SUBCUTANEOUS

## 2015-08-24 MED ORDER — HYDROCODONE-ACETAMINOPHEN 5-325 MG PO TABS: 1.0000 | ORAL_TABLET | Freq: Four times a day (QID) | ORAL | Status: DC | PRN

## 2015-08-24 MED ORDER — INSULIN ASPART 100 UNIT/ML ~~LOC~~ SOLN
0.0000 [IU] | Freq: Every day | SUBCUTANEOUS | Status: DC
  Administered 2015-08-24: 2 [IU] via SUBCUTANEOUS

## 2015-08-24 NOTE — Progress Notes (Signed)
Notified Dr. Leta Baptist about pt experiencing sudden Sinus Tachycardia with baseline heart rate at 70-80 then suddenly in 150s.  Telemetry shows sinus tach at these time.  Vitals are stable.  During these moments patient is resting comfortably in bed.  No further orders at this time.  Will continue to monitor.  Iantha Fallen RN 08/24/2015 1500

## 2015-08-24 NOTE — Progress Notes (Signed)
Subjective: She says she feels okay. She has significant abnormalities of both short and long-term memory that I think are probably related to her multiple seizures medications etc. She's been able to eat. She had some problems with shortness of breath last night and yesterday afternoon but that is better. Her MRI does not show anything specific.  Objective: Vital signs in last 24 hours: Temp:  [97.6 F (36.4 C)-98.1 F (36.7 C)] 97.7 F (36.5 C) (06/09 0400) Pulse Rate:  [68-96] 75 (06/09 0700) Resp:  [15-27] 15 (06/09 0700) BP: (98-130)/(45-87) 124/56 mmHg (06/09 0700) SpO2:  [91 %-100 %] 94 % (06/09 0700) FiO2 (%):  [28 %] 28 % (06/08 1700) Weight:  [57.3 kg (126 lb 5.2 oz)] 57.3 kg (126 lb 5.2 oz) (06/09 0500) Weight change: 1.7 kg (3 lb 12 oz) Last BM Date: 08/20/15  Intake/Output from previous day: 06/08 0701 - 06/09 0700 In: 2550 [P.O.:600; I.V.:1800; IV Piggyback:150] Out: 625 [Urine:625]  PHYSICAL EXAM General appearance: alert, cooperative and Confused about events Resp: clear to auscultation bilaterally Cardio: regular rate and rhythm, S1, S2 normal, no murmur, click, rub or gallop GI: soft, non-tender; bowel sounds normal; no masses,  no organomegaly Extremities: extremities normal, atraumatic, no cyanosis or edema  Lab Results:  Results for orders placed or performed during the hospital encounter of 08/20/15 (from the past 48 hour(s))  Basic metabolic panel     Status: Abnormal   Collection Time: 08/22/15  7:37 AM  Result Value Ref Range   Sodium 138 135 - 145 mmol/L   Potassium 3.2 (L) 3.5 - 5.1 mmol/L   Chloride 110 101 - 111 mmol/L   CO2 23 22 - 32 mmol/L   Glucose, Bld 187 (H) 65 - 99 mg/dL   BUN 10 6 - 20 mg/dL   Creatinine, Ser 1.03 (H) 0.44 - 1.00 mg/dL   Calcium 8.4 (L) 8.9 - 10.3 mg/dL   GFR calc non Af Amer 58 (L) >60 mL/min   GFR calc Af Amer >60 >60 mL/min    Comment: (NOTE) The eGFR has been calculated using the CKD EPI equation. This  calculation has not been validated in all clinical situations. eGFR's persistently <60 mL/min signify possible Chronic Kidney Disease.    Anion gap 5 5 - 15  Glucose, capillary     Status: Abnormal   Collection Time: 08/22/15  8:02 AM  Result Value Ref Range   Glucose-Capillary 204 (H) 65 - 99 mg/dL   Comment 1 Notify RN    Comment 2 Document in Chart   Glucose, capillary     Status: Abnormal   Collection Time: 08/22/15 11:30 AM  Result Value Ref Range   Glucose-Capillary 121 (H) 65 - 99 mg/dL   Comment 1 Notify RN    Comment 2 Document in Chart   Blood gas, arterial     Status: Abnormal   Collection Time: 08/22/15 12:30 PM  Result Value Ref Range   FIO2 40.00    Delivery systems VENTILATOR    Mode CONTINUOUS POSITIVE AIRWAY PRESSURE    Peep/cpap 5.0 cm H20   Pressure support 5 cm H20   pH, Arterial 7.428 7.350 - 7.450   pCO2 arterial 34.0 (L) 35.0 - 45.0 mmHg   pO2, Arterial 194 (H) 80.0 - 100.0 mmHg   Bicarbonate 23.4 20.0 - 24.0 mEq/L   Acid-base deficit 1.7 0.0 - 2.0 mmol/L   O2 Saturation 99.1 %   Patient temperature 37.0    Collection site LEFT RADIAL  Drawn by [phone]    Sample type ARTERIAL DRAW    Allens test (pass/fail) PASS PASS  Glucose, capillary     Status: Abnormal   Collection Time: 08/22/15  4:32 PM  Result Value Ref Range   Glucose-Capillary 157 (H) 65 - 99 mg/dL   Comment 1 Notify RN    Comment 2 Document in Chart   Glucose, capillary     Status: Abnormal   Collection Time: 08/22/15  7:50 PM  Result Value Ref Range   Glucose-Capillary 198 (H) 65 - 99 mg/dL  Glucose, capillary     Status: Abnormal   Collection Time: 08/23/15 12:21 AM  Result Value Ref Range   Glucose-Capillary 327 (H) 65 - 99 mg/dL  Glucose, capillary     Status: Abnormal   Collection Time: 08/23/15  3:47 AM  Result Value Ref Range   Glucose-Capillary 240 (H) 65 - 99 mg/dL  Glucose, capillary     Status: None   Collection Time: 08/23/15  7:49 AM  Result Value Ref Range    Glucose-Capillary 94 65 - 99 mg/dL   Comment 1 Notify RN    Comment 2 Document in Chart   Glucose, capillary     Status: Abnormal   Collection Time: 08/23/15 12:14 PM  Result Value Ref Range   Glucose-Capillary 312 (H) 65 - 99 mg/dL   Comment 1 Notify RN    Comment 2 Document in Chart   Glucose, capillary     Status: Abnormal   Collection Time: 08/23/15  1:33 PM  Result Value Ref Range   Glucose-Capillary 404 (H) 65 - 99 mg/dL  Glucose, capillary     Status: Abnormal   Collection Time: 08/23/15  8:14 PM  Result Value Ref Range   Glucose-Capillary 353 (H) 65 - 99 mg/dL  Glucose, capillary     Status: Abnormal   Collection Time: 08/24/15 12:08 AM  Result Value Ref Range   Glucose-Capillary 337 (H) 65 - 99 mg/dL  Glucose, capillary     Status: Abnormal   Collection Time: 08/24/15  4:32 AM  Result Value Ref Range   Glucose-Capillary 189 (H) 65 - 99 mg/dL    ABGS  Recent Labs  08/22/15 0535 08/22/15 1230  PHART 7.568* 7.428  PO2ART 177.0* 194*  TCO2 15.4  --   HCO3 25.0* 23.4   CULTURES Recent Results (from the past 240 hour(s))  Urine culture     Status: None   Collection Time: 08/20/15  5:50 PM  Result Value Ref Range Status   Specimen Description URINE, CLEAN CATCH  Final   Special Requests NONE  Final   Culture NO GROWTH Performed at [facility]   Final   Report Status 08/22/2015 FINAL  Final  CSF culture     Status: None (Preliminary result)   Collection Time: 08/20/15  6:00 PM  Result Value Ref Range Status   Specimen Description CSF  Final   Special Requests NONE  Final   Gram Stain   Final    CYTOSPIN SMEAR WBC PRESENT,BOTH PMN AND MONONUCLEAR NO ORGANISMS SEEN Performed at [facility]    Culture   Final    NO GROWTH 3 DAYS Performed at [facility]    Report Status PENDING  Incomplete  Gram stain     Status: None   Collection Time: 08/20/15  6:50 PM  Result Value Ref Range Status   Specimen Description CSF  Final   Special  Requests NONE  Final  Gram Stain   Final    CYTOSPIN SMEAR NO ORGANISMS SEEN WBC PRESENT,BOTH PMN AND MONONUCLEAR Performed at [facility]   Report Status 08/20/2015 FINAL  Final  Hsv Culture And Typing     Status: None   Collection Time: 08/20/15  6:50 PM  Result Value Ref Range Status   HSV Culture/Type Comment  Final    Comment: (NOTE) Negative No Herpes simplex virus isolated. Performed At: [facility] [place], [place] [id number] Lindon Romp MD [id number]    Source of Sample CSF  Final  Blood Culture (routine x 2)     Status: None (Preliminary result)   Collection Time: 08/20/15  7:15 PM  Result Value Ref Range Status   Specimen Description BLOOD RIGHT HAND  Final   Special Requests BOTTLES DRAWN AEROBIC AND ANAEROBIC 6CC  Final   Culture NO GROWTH 3 DAYS  Final   Report Status PENDING  Incomplete  Blood Culture (routine x 2)     Status: None (Preliminary result)   Collection Time: 08/20/15  7:22 PM  Result Value Ref Range Status   Specimen Description BLOOD RIGHT HAND  Final   Special Requests BOTTLES DRAWN AEROBIC AND ANAEROBIC 6CC  Final   Culture NO GROWTH 3 DAYS  Final   Report Status PENDING  Incomplete  MRSA PCR Screening     Status: None   Collection Time: 08/20/15  9:25 PM  Result Value Ref Range Status   MRSA by PCR NEGATIVE NEGATIVE Final    Comment:        The GeneXpert MRSA Assay (FDA approved for NASAL specimens only), is one component of a comprehensive MRSA colonization surveillance program. It is not intended to diagnose MRSA infection nor to guide or monitor treatment for MRSA infections.    Studies/Results: Mr Kizzie Fantasia Contrast  08/23/2015  CLINICAL DATA:  60 year old female with seizure, status epilepticus. Unresponsive. Initial encounter. EXAM: MRI HEAD WITHOUT AND WITH CONTRAST TECHNIQUE: Multiplanar, multiecho pulse sequences of the brain and surrounding structures were obtained without and with  intravenous contrast. CONTRAST:  63m MULTIHANCE GADOBENATE DIMEGLUMINE 529 MG/ML IV SOLN COMPARISON:  Head CTs without contrast 08/20/2015 and earlier. [facility] Brain MRI 03/06/2010. FINDINGS: Major intracranial vascular flow voids are stable and normal. Cerebral volume is stable and normal for age. No restricted diffusion or evidence of acute infarction. No midline shift, mass effect, evidence of mass lesion, ventriculomegaly, extra-axial collection or acute intracranial hemorrhage. Cervicomedullary junction and pituitary are within normal limits. GPearline Cablesand white matter signal throughout the brain appears stable and largely unremarkable for age; there are mild nonspecific white matter signal changes. No edema or acute signal abnormality in the hippocampal formations. The left hippocampal formation has a more vertical configuration chronically (better seen on series 9, image 17 in 2011). No cortical encephalomalacia or chronic cerebral blood products. No abnormal enhancement identified. No dural thickening. Mild mastoid effusions are new, greater on the right. Negative nasopharynx. Otherwise negative visualized internal auditory structures. Trace ethmoid sinus mucosal thickening. Stable and negative orbit and scalp soft tissues. Negative visualized cervical spine. Bone marrow signal is stable and within normal limits. IMPRESSION: No acute intracranial abnormality. Stable since 2011 and largely unremarkable for age MRI appearance of the brain. Electronically Signed   By: HGenevie AnnM.D.   On: 08/23/2015 10:12    Medications:  Prior to Admission:  Prescriptions prior to admission  Medication Sig Dispense Refill Last Dose  . acetaminophen (TYLENOL) 500  MG tablet Take 1,000 mg by mouth every 6 (six) hours as needed for mild pain or moderate pain.   unknown  . ALPRAZolam (XANAX) 1 MG tablet Take 1 mg by mouth 3 (three) times daily as needed. For anxiety   08/20/2015 at Unknown time  . aspirin EC 81 MG  tablet Take 81 mg by mouth daily.   08/20/2015 at Unknown time  . cyclobenzaprine (FLEXERIL) 10 MG tablet Take 1 tablet (10 mg total) by mouth 3 (three) times daily as needed for muscle spasms. 20 tablet 0 unknown  . insulin glargine (LANTUS) 100 UNIT/ML injection Inject 22 Units into the skin every morning.    08/20/2015 at Unknown time  . NOVOLOG FLEXPEN 100 UNIT/ML FlexPen Inject 2-14 Units into the skin 3 (three) times daily before meals.   12 08/20/2015 at Unknown time  . RESTASIS 0.05 % ophthalmic emulsion Apply 1 drop to eye 2 (two) times daily.  3 unknown at Unknown time  . sertraline (ZOLOFT) 50 MG tablet TK 1 T PO QD  12 08/19/2015 at Unknown time  . cephALEXin (KEFLEX) 500 MG capsule Take 1 capsule (500 mg total) by mouth 3 (three) times daily. (Patient not taking: Reported on 08/20/2015) 15 capsule 0    Scheduled: . antiseptic oral rinse  7 mL Mouth Rinse QID  . aspirin  300 mg Rectal Daily  . chlorhexidine gluconate (SAGE KIT)  15 mL Mouth Rinse BID  . cycloSPORINE  1 drop Both Eyes BID  . famotidine (PEPCID) IV  20 mg Intravenous Q12H  . heparin  5,000 Units Subcutaneous Q8H  . insulin aspart  0-9 Units Subcutaneous Q4H  . levETIRAcetam  500 mg Intravenous Q12H   Continuous: . dextrose 5 % and 0.45% NaCl 75 mL/hr at 08/24/15 0600   EPP:IRJJOA chloride, acetaminophen **OR** acetaminophen (TYLENOL) oral liquid 160 mg/5 mL **OR** acetaminophen, albuterol, etomidate, fentaNYL (SUBLIMAZE) injection, ondansetron (ZOFRAN) IV, phenol, promethazine  Assesment: She was admitted with status epilepticus. Thus far none of her studies have shown a reason for this. She had lumbar puncture which did not show any evidence of infection. She had EEG which is nonspecific with moderate global slowing, burst suppression pattern, and focal intermittent rhythmic delta activity but no seizure activity. CT was nonspecific and MRI is essentially normal. She had carotid Doppler study that does not show significant  blockage. Echocardiogram was normal  She has some mild COPD and had some problems breathing yesterday but that's better  She has diabetes and her blood sugar is not controlled but I have been hesitant to put her on too much insulin until we saw she daily. I will advance her diet and increase her insulin. She was treated for diabetic ketoacidosis when she was admitted but I think it was more related to lactic acidosis from her seizures.  She now has some global amnesia and I think that's related to the post ictal state plus minus medications  She had elevated troponin which I think is from the seizures and demand ischemia but no evidence of heart attack Active Problems:   Diabetes (HCC)   COPD (chronic obstructive pulmonary disease) (HCC)   Status epilepticus (HCC)   Seizure ([place])   DKA (diabetic ketoacidoses) ([place])    Plan: Transfer from ICU. Put her on oral medications. Probable discharge tomorrow. Advance her diet. Increase her insulin    LOS: 4 days   Chayce Robbins L 08/24/2015, 7:34 AM

## 2015-08-24 NOTE — Progress Notes (Signed)
Pt being transferred to 329 via wheelchair, report called to [employer], belongings w/ pt, family at bedside.

## 2015-08-25 LAB — CULTURE, BLOOD (ROUTINE X 2)
Culture: NO GROWTH
Culture: NO GROWTH

## 2015-08-25 LAB — HERPES SIMPLEX VIRUS(HSV) DNA BY PCR
HSV 1 DNA: NEGATIVE
HSV 2 DNA: NEGATIVE

## 2015-08-25 LAB — GLUCOSE, CAPILLARY
Glucose-Capillary: 84 mg/dL (ref 65–99)
Glucose-Capillary: 161 mg/dL — ABNORMAL HIGH (ref 65–99)

## 2015-08-25 MED ORDER — IBUPROFEN 800 MG PO TABS
800.0000 mg | ORAL_TABLET | Freq: Once | ORAL | Status: AC
  Administered 2015-08-25: 800 mg via ORAL
  Filled 2015-08-25: qty 1

## 2015-08-25 MED ORDER — HYDROCODONE-ACETAMINOPHEN 5-325 MG PO TABS: 1.0000 | ORAL_TABLET | Freq: Four times a day (QID) | ORAL | Status: DC | PRN

## 2015-08-25 MED ORDER — NOVOLOG FLEXPEN 100 UNIT/ML ~~LOC~~ SOPN: PEN_INJECTOR | SUBCUTANEOUS | Status: AC

## 2015-08-25 MED ORDER — IBUPROFEN 200 MG PO TABS: 200.0000 mg | ORAL_TABLET | Freq: Four times a day (QID) | ORAL | Status: DC | PRN

## 2015-08-25 MED ORDER — LEVETIRACETAM 500 MG PO TABS: 500.0000 mg | ORAL_TABLET | Freq: Two times a day (BID) | ORAL | Status: DC

## 2015-08-25 NOTE — Progress Notes (Signed)
Reviewed discharge instructions with client and spouse, reviewed medications and next dose due, reviewed s/s to report to MD/EMS, client [facility], who are aware of clients discharge and to see patient within 48 hours. Client/spouse voice understanding to instructions.

## 2015-08-25 NOTE — Progress Notes (Signed)
CM able to speak with patient regarding [facility] services.  She has received [facility] services in the past and can't remember the company name.  Patient decided to use [facility].  CM faxed information for processing.

## 2015-08-25 NOTE — Progress Notes (Signed)
Subjective: She feels okay. Events noted. She was asymptomatic with her tachycardia. She is complaining of chest wall pain and she is very tender in the chest wall. Her echocardiogram was normal  Objective: Vital signs in last 24 hours: Temp:  [98 F (36.7 C)-99.8 F (37.7 C)] 99.8 F (37.7 C) (06/10 0600) Pulse Rate:  [69-80] 74 (06/10 0600) Resp:  [16-23] 20 (06/10 0600) BP: (116-142)/(43-70) 117/46 mmHg (06/10 0600) SpO2:  [92 %-98 %] 92 % (06/10 0600) Weight:  [61.236 kg (135 lb)] 61.236 kg (135 lb) (06/10 0600) Weight change: 3.936 kg (8 lb 10.8 oz) Last BM Date: 08/22/15  Intake/Output from previous day: 06/09 0701 - 06/10 0700 In: 960 [P.O.:960] Out: 500 [Urine:500]  PHYSICAL EXAM General appearance: alert, cooperative and no distress Resp: Her chest is clear but she has tenderness in the chest wall Cardio: regular rate and rhythm, S1, S2 normal, no murmur, click, rub or gallop GI: soft, non-tender; bowel sounds normal; no masses,  no organomegaly Extremities: extremities normal, atraumatic, no cyanosis or edema  Lab Results:  Results for orders placed or performed during the hospital encounter of 08/20/15 (from the past 48 hour(s))  Glucose, capillary     Status: Abnormal   Collection Time: 08/23/15 12:14 PM  Result Value Ref Range   Glucose-Capillary 312 (H) 65 - 99 mg/dL   Comment 1 Notify RN    Comment 2 Document in Chart   Glucose, capillary     Status: Abnormal   Collection Time: 08/23/15  1:33 PM  Result Value Ref Range   Glucose-Capillary 404 (H) 65 - 99 mg/dL  Glucose, capillary     Status: Abnormal   Collection Time: 08/23/15  8:14 PM  Result Value Ref Range   Glucose-Capillary 353 (H) 65 - 99 mg/dL  Glucose, capillary     Status: Abnormal   Collection Time: 08/24/15 12:08 AM  Result Value Ref Range   Glucose-Capillary 337 (H) 65 - 99 mg/dL  Glucose, capillary     Status: Abnormal   Collection Time: 08/24/15  4:32 AM  Result Value Ref Range   Glucose-Capillary 189 (H) 65 - 99 mg/dL  Glucose, capillary     Status: Abnormal   Collection Time: 08/24/15  7:43 AM  Result Value Ref Range   Glucose-Capillary 243 (H) 65 - 99 mg/dL   Comment 1 Notify RN    Comment 2 Document in Chart   Glucose, capillary     Status: Abnormal   Collection Time: 08/24/15 11:52 AM  Result Value Ref Range   Glucose-Capillary 294 (H) 65 - 99 mg/dL   Comment 1 Notify RN    Comment 2 Document in Chart   Glucose, capillary     Status: Abnormal   Collection Time: 08/24/15  5:03 PM  Result Value Ref Range   Glucose-Capillary 317 (H) 65 - 99 mg/dL   Comment 1 Notify RN    Comment 2 Document in Chart   Glucose, capillary     Status: Abnormal   Collection Time: 08/24/15  8:56 PM  Result Value Ref Range   Glucose-Capillary 215 (H) 65 - 99 mg/dL   Comment 1 Notify RN    Comment 2 Document in Chart   Glucose, capillary     Status: None   Collection Time: 08/25/15  7:40 AM  Result Value Ref Range   Glucose-Capillary 84 65 - 99 mg/dL   Comment 1 Notify RN    Comment 2 Document in Chart     ABGS  Recent Labs  08/22/15 1230  PHART 7.428  PO2ART 194*  HCO3 23.4   CULTURES Recent Results (from the past 240 hour(s))  Urine culture     Status: None   Collection Time: 08/20/15  5:50 PM  Result Value Ref Range Status   Specimen Description URINE, CLEAN CATCH  Final   Special Requests NONE  Final   Culture NO GROWTH Performed at [facility]   Final   Report Status 08/22/2015 FINAL  Final  CSF culture     Status: None   Collection Time: 08/20/15  6:00 PM  Result Value Ref Range Status   Specimen Description CSF  Final   Special Requests NONE  Final   Gram Stain   Final    CYTOSPIN SMEAR WBC PRESENT,BOTH PMN AND MONONUCLEAR NO ORGANISMS SEEN Performed at [facility]    Culture   Final    NO GROWTH 3 DAYS Performed at [facility]    Report Status 08/24/2015 FINAL  Final  Gram stain     Status: None   Collection Time:  08/20/15  6:50 PM  Result Value Ref Range Status   Specimen Description CSF  Final   Special Requests NONE  Final   Gram Stain   Final    CYTOSPIN SMEAR NO ORGANISMS SEEN WBC PRESENT,BOTH PMN AND MONONUCLEAR Performed at [facility]   Report Status 08/20/2015 FINAL  Final  Hsv Culture And Typing     Status: None   Collection Time: 08/20/15  6:50 PM  Result Value Ref Range Status   HSV Culture/Type Comment  Final    Comment: (NOTE) Negative No Herpes simplex virus isolated. Performed At: [facility] [place], [place] [id number] Lindon Romp MD [id number]    Source of Sample CSF  Final  Blood Culture (routine x 2)     Status: None   Collection Time: 08/20/15  7:15 PM  Result Value Ref Range Status   Specimen Description BLOOD RIGHT HAND  Final   Special Requests BOTTLES DRAWN AEROBIC AND ANAEROBIC 6CC  Final   Culture NO GROWTH 5 DAYS  Final   Report Status 08/25/2015 FINAL  Final  Blood Culture (routine x 2)     Status: None   Collection Time: 08/20/15  7:22 PM  Result Value Ref Range Status   Specimen Description BLOOD RIGHT HAND  Final   Special Requests BOTTLES DRAWN AEROBIC AND ANAEROBIC 6CC  Final   Culture NO GROWTH 5 DAYS  Final   Report Status 08/25/2015 FINAL  Final  MRSA PCR Screening     Status: None   Collection Time: 08/20/15  9:25 PM  Result Value Ref Range Status   MRSA by PCR NEGATIVE NEGATIVE Final    Comment:        The GeneXpert MRSA Assay (FDA approved for NASAL specimens only), is one component of a comprehensive MRSA colonization surveillance program. It is not intended to diagnose MRSA infection nor to guide or monitor treatment for MRSA infections.    Studies/Results: Mr Kizzie Fantasia Contrast  08/23/2015  CLINICAL DATA:  60 year old female with seizure, status epilepticus. Unresponsive. Initial encounter. EXAM: MRI HEAD WITHOUT AND WITH CONTRAST TECHNIQUE: Multiplanar, multiecho pulse sequences of the brain  and surrounding structures were obtained without and with intravenous contrast. CONTRAST:  40mL MULTIHANCE GADOBENATE DIMEGLUMINE 529 MG/ML IV SOLN COMPARISON:  Head CTs without contrast 08/20/2015 and earlier. [facility] Brain MRI 03/06/2010. FINDINGS: Major intracranial vascular flow  voids are stable and normal. Cerebral volume is stable and normal for age. No restricted diffusion or evidence of acute infarction. No midline shift, mass effect, evidence of mass lesion, ventriculomegaly, extra-axial collection or acute intracranial hemorrhage. Cervicomedullary junction and pituitary are within normal limits. Pearline Cables and white matter signal throughout the brain appears stable and largely unremarkable for age; there are mild nonspecific white matter signal changes. No edema or acute signal abnormality in the hippocampal formations. The left hippocampal formation has a more vertical configuration chronically (better seen on series 9, image 17 in 2011). No cortical encephalomalacia or chronic cerebral blood products. No abnormal enhancement identified. No dural thickening. Mild mastoid effusions are new, greater on the right. Negative nasopharynx. Otherwise negative visualized internal auditory structures. Trace ethmoid sinus mucosal thickening. Stable and negative orbit and scalp soft tissues. Negative visualized cervical spine. Bone marrow signal is stable and within normal limits. IMPRESSION: No acute intracranial abnormality. Stable since 2011 and largely unremarkable for age MRI appearance of the brain. Electronically Signed   By: Genevie Ann M.D.   On: 08/23/2015 10:12    Medications:  Prior to Admission:  Prescriptions prior to admission  Medication Sig Dispense Refill Last Dose  . acetaminophen (TYLENOL) 500 MG tablet Take 1,000 mg by mouth every 6 (six) hours as needed for mild pain or moderate pain.   unknown  . ALPRAZolam (XANAX) 1 MG tablet Take 1 mg by mouth 3 (three) times daily as needed. For  anxiety   08/20/2015 at Unknown time  . aspirin EC 81 MG tablet Take 81 mg by mouth daily.   08/20/2015 at Unknown time  . cyclobenzaprine (FLEXERIL) 10 MG tablet Take 1 tablet (10 mg total) by mouth 3 (three) times daily as needed for muscle spasms. 20 tablet 0 unknown  . insulin glargine (LANTUS) 100 UNIT/ML injection Inject 22 Units into the skin every morning.    08/20/2015 at Unknown time  . RESTASIS 0.05 % ophthalmic emulsion Apply 1 drop to eye 2 (two) times daily.  3 unknown at Unknown time  . sertraline (ZOLOFT) 50 MG tablet TK 1 T PO QD  12 08/19/2015 at Unknown time  . [DISCONTINUED] NOVOLOG FLEXPEN 100 UNIT/ML FlexPen Inject 2-14 Units into the skin 3 (three) times daily before meals.   12 08/20/2015 at Unknown time  . cephALEXin (KEFLEX) 500 MG capsule Take 1 capsule (500 mg total) by mouth 3 (three) times daily. (Patient not taking: Reported on 08/20/2015) 15 capsule 0    Scheduled: . aspirin  81 mg Oral Daily  . cycloSPORINE  1 drop Both Eyes BID  . heparin  5,000 Units Subcutaneous Q8H  . insulin aspart  0-15 Units Subcutaneous TID WC  . insulin aspart  0-5 Units Subcutaneous QHS  . insulin glargine  18 Units Subcutaneous QHS  . levETIRAcetam  500 mg Oral BID   Continuous:  [id number] **OR** acetaminophen (TYLENOL) oral liquid 160 mg/5 mL **OR** acetaminophen, albuterol, HYDROcodone-acetaminophen, ondansetron (ZOFRAN) IV, phenol, promethazine  Assesment: She came in with status epilepticus. She has had multiple seizures but none after she was started on IV Keppra. She is now on by mouth Keppra still with no seizures. She has diabetes and was admitted with a diagnosis of diabetic ketoacidosis but I think it was actually more lactic acidosis from her seizures. She is having chest wall pain also I think from the vigorous seizure activity. She is much improved. She's had some problems with memory which I think is post ictal. She  is ready for discharge Active Problems:   Diabetes ([place])    COPD (chronic obstructive pulmonary disease) ([place])   Status epilepticus ([place])   Seizure ([place])   DKA (diabetic ketoacidoses) ([place])    Plan: Discharge home with home health    LOS: 5 days   Hideo Googe L 08/25/2015, 9:31 AM

## 2015-08-25 NOTE — Discharge Summary (Signed)
Physician Discharge Summary  Patient ID: Veronica Moses MRN: [id number] DOB/AGE: [birth year] 60 y.o. Primary Care Physician:Laelah Siravo L, MD Admit date: 08/20/2015 Discharge date: 08/25/2015    Discharge Diagnoses:   Active Problems:   Diabetes ([place])   COPD (chronic obstructive pulmonary disease) ([facility])   Status epilepticus ([place])   Seizure ([place])   DKA (diabetic ketoacidoses) ([place])     Medication List    STOP taking these medications        cephALEXin 500 MG capsule  Commonly known as:  KEFLEX      TAKE these medications        acetaminophen 500 MG tablet  Commonly known as:  TYLENOL  Take 1,000 mg by mouth every 6 (six) hours as needed for mild pain or moderate pain.     ALPRAZolam 1 MG tablet  Commonly known as:  XANAX  Take 1 mg by mouth 3 (three) times daily as needed. For anxiety     aspirin EC 81 MG tablet  Take 81 mg by mouth daily.     cyclobenzaprine 10 MG tablet  Commonly known as:  FLEXERIL  Take 1 tablet (10 mg total) by mouth 3 (three) times daily as needed for muscle spasms.     HYDROcodone-acetaminophen 5-325 MG tablet  Commonly known as:  NORCO/VICODIN  Take 1 tablet by mouth every 6 (six) hours as needed for moderate pain.     ibuprofen 200 MG tablet  Commonly known as:  ADVIL  Take 1 tablet (200 mg total) by mouth every 6 (six) hours as needed.     insulin glargine 100 UNIT/ML injection  Commonly known as:  LANTUS  Inject 22 Units into the skin every morning.     levETIRAcetam 500 MG tablet  Commonly known as:  KEPPRA  Take 1 tablet (500 mg total) by mouth 2 (two) times daily.     NOVOLOG FLEXPEN 100 UNIT/ML FlexPen  Generic drug:  insulin aspart  Please use the hospital sliding scale provided for you     RESTASIS 0.05 % ophthalmic emulsion  Generic drug:  cycloSPORINE  Apply 1 drop to eye 2 (two) times daily.     sertraline 50 MG tablet  Commonly known as:  ZOLOFT  TK 1 T PO QD        Discharged  Condition:Improved    Consults: Neurology Dr. Merlene Laughter  Significant Diagnostic Studies: Ct Head Wo Contrast  08/20/2015  CLINICAL DATA:  Seizure today. EXAM: CT HEAD WITHOUT CONTRAST TECHNIQUE: Contiguous axial images were obtained from the base of the skull through the vertex without intravenous contrast. COMPARISON:  05/31/2014 FINDINGS: A small left frontal scalp hematoma is noted but no underlying skull fracture. The ventricles are in the midline without mass effect or shift. They are normal in size and configuration. No extra-axial fluid collections are identified. No findings for hemispheric infarction an or intracranial hemorrhage. No mass lesion. The brainstem and cerebellum are grossly normal. IMPRESSION: No acute intracranial findings or skull fracture. No change since prior study. Electronically Signed   By: Marijo Sanes M.D.   On: 08/20/2015 18:42   Mr Jeri Cos [id number] Contrast  08/23/2015  CLINICAL DATA:  60 year old female with seizure, status epilepticus. Unresponsive. Initial encounter. EXAM: MRI HEAD WITHOUT AND WITH CONTRAST TECHNIQUE: Multiplanar, multiecho pulse sequences of the brain and surrounding structures were obtained without and with intravenous contrast. CONTRAST:  41mL MULTIHANCE GADOBENATE DIMEGLUMINE 529 MG/ML IV SOLN COMPARISON:  Head CTs without contrast 08/20/2015 and earlier. [facility]  Brain MRI 03/06/2010. FINDINGS: Major intracranial vascular flow voids are stable and normal. Cerebral volume is stable and normal for age. No restricted diffusion or evidence of acute infarction. No midline shift, mass effect, evidence of mass lesion, ventriculomegaly, extra-axial collection or acute intracranial hemorrhage. Cervicomedullary junction and pituitary are within normal limits. [place] and white matter signal throughout the brain appears stable and largely unremarkable for age; there are mild nonspecific white matter signal changes. No edema or acute signal abnormality in the  hippocampal formations. The left hippocampal formation has a more vertical configuration chronically (better seen on series 9, image 17 in 2011). No cortical encephalomalacia or chronic cerebral blood products. No abnormal enhancement identified. No dural thickening. Mild mastoid effusions are new, greater on the right. Negative nasopharynx. Otherwise negative visualized internal auditory structures. Trace ethmoid sinus mucosal thickening. Stable and negative orbit and scalp soft tissues. Negative visualized cervical spine. Bone marrow signal is stable and within normal limits. IMPRESSION: No acute intracranial abnormality. Stable since 2011 and largely unremarkable for age MRI appearance of the brain. Electronically Signed   By: Genevie Ann M.D.   On: 08/23/2015 10:12   US Carotid Bilateral  08/21/2015  CLINICAL DATA:  Seizures and altered mental status EXAM: BILATERAL CAROTID DUPLEX ULTRASOUND TECHNIQUE: [place] scale imaging, color Doppler and duplex ultrasound were performed of bilateral carotid and vertebral arteries in the neck. COMPARISON:  None. FINDINGS: Criteria: Quantification of carotid stenosis is based on velocity parameters that correlate the residual internal carotid diameter with NASCET-based stenosis levels, using the diameter of the distal internal carotid lumen as the denominator for stenosis measurement. The following velocity measurements were obtained: RIGHT ICA:  148 cm/sec CCA:  [id number] cm/sec SYSTOLIC ICA/CCA RATIO:  1.2 DIASTOLIC ICA/CCA RATIO:  1.2 ECA:  101 cm/sec LEFT ICA:  159 cm/sec CCA:  [id number] cm/sec SYSTOLIC ICA/CCA RATIO:  1.2 DIASTOLIC ICA/CCA RATIO:  1.1 ECA:  135 cm/sec RIGHT CAROTID ARTERY: Moderate heterogeneous soft plaque in the bulb. Low resistance internal carotid Doppler pattern. RIGHT VERTEBRAL ARTERY:  Antegrade. LEFT CAROTID ARTERY: Moderate mixed plaque in the bulb. Low resistance internal carotid Doppler pattern. LEFT VERTEBRAL ARTERY:  Antegrade. IMPRESSION: 50-69% stenosis in  the right and left internal carotid arteries. Electronically Signed   By: Marybelle Killings M.D.   On: 08/21/2015 10:37   Dg Chest Port 1 View  08/22/2015  CLINICAL DATA:  Intubated patient, respiratory failure, COPD, DKA, seizure disorder, former smoker. EXAM: PORTABLE CHEST 1 VIEW COMPARISON:  Chest x-ray of August 21, 2015 FINDINGS: The lungs remain well-expanded. There is no infiltrate, pneumothorax, or pleural effusion. The heart is normal in size. The pulmonary vascularity is mildly prominent. The endotracheal tube tip lies 4.1 cm above the carina. The esophagogastric tube tip projects below the inferior margin of the image. The observed bony thorax exhibits no acute abnormality. IMPRESSION: Intubated patient with support tubes in reasonable position. COPD. No definite acute cardiopulmonary abnormality. Electronically Signed   By: David  Martinique M.D.   On: 08/22/2015 07:50   Dg Chest Port 1 View  08/21/2015  CLINICAL DATA:  Respiratory failure EXAM: PORTABLE CHEST 1 VIEW COMPARISON:  Chest radiograph from one day prior. FINDINGS: Endotracheal tube tip is 4.3 cm above the carina. Enteric tube enters stomach with the tip not seen on this image. Stable cardiomediastinal silhouette with normal heart size. No pneumothorax. No pleural effusion. Lungs appear clear, with no acute consolidative airspace disease and no pulmonary edema. IMPRESSION: Well-positioned endotracheal and enteric tubes. No active cardiopulmonary  disease. Electronically Signed   By: Ilona Sorrel M.D.   On: 08/21/2015 09:08   Dg Chest Port 1 View  08/20/2015  CLINICAL DATA:  Check endotracheal tube placement EXAM: PORTABLE CHEST 1 VIEW COMPARISON:  05/21/2015 FINDINGS: Cardiac shadow is stable. An endotracheal tube is now seen 5.8 cm above the carina. A nasogastric catheter is seen within the stomach. The lungs are well-aerated without focal infiltrate or sizable effusion. No acute bony abnormality is seen. IMPRESSION: Tubes and lines in satisfactory  position. No acute abnormality noted. Electronically Signed   By: Inez Catalina M.D.   On: 08/20/2015 18:18    Lab Results: Basic Metabolic Panel: No results for input(s): NA, K, CL, CO2, GLUCOSE, BUN, CREATININE, CALCIUM, MG, PHOS in the last 72 hours. Liver Function Tests: No results for input(s): AST, ALT, ALKPHOS, BILITOT, PROT, ALBUMIN in the last 72 hours.   CBC: No results for input(s): WBC, NEUTROABS, HGB, HCT, MCV, PLT in the last 72 hours.  Recent Results (from the past 240 hour(s))  Urine culture     Status: None   Collection Time: 08/20/15  5:50 PM  Result Value Ref Range Status   Specimen Description URINE, CLEAN CATCH  Final   Special Requests NONE  Final   Culture NO GROWTH Performed at [facility]   Final   Report Status 08/22/2015 FINAL  Final  CSF culture     Status: None   Collection Time: 08/20/15  6:00 PM  Result Value Ref Range Status   Specimen Description CSF  Final   Special Requests NONE  Final   Gram Stain   Final    CYTOSPIN SMEAR WBC PRESENT,BOTH PMN AND MONONUCLEAR NO ORGANISMS SEEN Performed at [facility]    Culture   Final    NO GROWTH 3 DAYS Performed at [facility]    Report Status 08/24/2015 FINAL  Final  Gram stain     Status: None   Collection Time: 08/20/15  6:50 PM  Result Value Ref Range Status   Specimen Description CSF  Final   Special Requests NONE  Final   Gram Stain   Final    CYTOSPIN SMEAR NO ORGANISMS SEEN WBC PRESENT,BOTH PMN AND MONONUCLEAR Performed at [facility]   Report Status 08/20/2015 FINAL  Final  Hsv Culture And Typing     Status: None   Collection Time: 08/20/15  6:50 PM  Result Value Ref Range Status   HSV Culture/Type Comment  Final    Comment: (NOTE) Negative No Herpes simplex virus isolated. Performed At: [facility] [place], [place] [id number] Lindon Romp MD [id number]    Source of Sample CSF  Final  Blood Culture (routine x 2)      Status: None   Collection Time: 08/20/15  7:15 PM  Result Value Ref Range Status   Specimen Description BLOOD RIGHT HAND  Final   Special Requests BOTTLES DRAWN AEROBIC AND ANAEROBIC [facility]  Final   Culture NO GROWTH 5 DAYS  Final   Report Status 08/25/2015 FINAL  Final  Blood Culture (routine x 2)     Status: None   Collection Time: 08/20/15  7:22 PM  Result Value Ref Range Status   Specimen Description BLOOD RIGHT HAND  Final   Special Requests BOTTLES DRAWN AEROBIC AND ANAEROBIC 6CC  Final   Culture NO GROWTH 5 DAYS  Final   Report Status 08/25/2015 FINAL  Final  MRSA PCR Screening  Status: None   Collection Time: 08/20/15  9:25 PM  Result Value Ref Range Status   MRSA by PCR NEGATIVE NEGATIVE Final    Comment:        The GeneXpert MRSA Assay (FDA approved for NASAL specimens only), is one component of a comprehensive MRSA colonization surveillance program. It is not intended to diagnose MRSA infection nor to guide or monitor treatment for MRSA infections.      Hospital Course: This is a 60 year old who came to the hospital with seizures. She had multiple seizures. She was intubated for airway protection. She was started on IV Keppra. Her blood sugar was elevated and she had acidemia and it was thought that she might have diabetic ketoacidosis but it appears that that was more related to lactic acidemia from her seizures. She had no further seizures after being placed on IV Keppra. She was extubated on the second hospital day. She had consultation with neurology Dr. Merlene Laughter. She had multiple evaluations including echocardiogram lumbar puncture CT of the brain, MRI, carotid Doppler done of which showed a definitive cause of her seizures. She was transitioned to oral Keppra still had no further seizures. She is discharged home in improved condition although she does have significant myalgias from her multiple seizures. She also had some memory issues which I think may be post ictal  state  Discharge Exam: Blood pressure 117/46, pulse 74, temperature 99.8 F (37.7 C), temperature source Oral, resp. rate 20, height 5\' 1"  (1.549 m), weight 61.236 kg (135 lb), SpO2 92 %. She is awake and alert. She has chest wall tenderness. Her chest is clear.  Disposition: Home with home health services. She will be off her Zoloft because that may have decreased her seizure threshold.      Discharge Instructions    Face-to-face encounter (required for Medicare/Medicaid patients)    Complete by:  As directed   I Isrrael Fluckiger L certify that this patient is under my care and that I, or a nurse practitioner or physician's assistant working with me, had a face-to-face encounter that meets the physician face-to-face encounter requirements with this patient on 08/25/2015. The encounter with the patient was in whole, or in part for the following medical condition(s) which is the primary reason for home health care (List medical condition): Status epilepticus  The encounter with the patient was in whole, or in part, for the following medical condition, which is the primary reason for home health care:  Status epilepticus  I certify that, based on my findings, the following services are medically necessary home health services:   Nursing Physical therapy    Reason for Medically Necessary Home Health Services:  Skilled Nursing- Change/Decline in Patient Status  My clinical findings support the need for the above services:  Cognitive impairments, dementia, or mental confusion  that make it unsafe to leave home  Further, I certify that my clinical findings support that this patient is homebound due to:  Mental confusion     Home Health    Complete by:  As directed   To provide the following care/treatments:   PT RN               Signed: Aimee Timmons L   08/25/2015, 9:34 AM

## 2015-08-27 ENCOUNTER — Emergency Department (HOSPITAL_COMMUNITY): Payer: Medicaid Other

## 2015-08-27 ENCOUNTER — Inpatient Hospital Stay (HOSPITAL_COMMUNITY)
Admission: EM | Admit: 2015-08-27 | Discharge: 2015-09-05 | DRG: 100 | Disposition: A | Discharge status: Other Acute Inpatient Hospital | Payer: Medicaid Other | Attending: Internal Medicine | Admitting: Internal Medicine

## 2015-08-27 ENCOUNTER — Encounter (HOSPITAL_COMMUNITY): Payer: Self-pay | Admitting: Emergency Medicine

## 2015-08-27 DIAGNOSIS — J189 Pneumonia, unspecified organism: Secondary | ICD-10-CM | POA: Diagnosis present

## 2015-08-27 DIAGNOSIS — Z6821 Body mass index (BMI) 21.0-21.9, adult: Secondary | ICD-10-CM

## 2015-08-27 DIAGNOSIS — R509 Fever, unspecified: Secondary | ICD-10-CM

## 2015-08-27 DIAGNOSIS — R569 Unspecified convulsions: Secondary | ICD-10-CM

## 2015-08-27 DIAGNOSIS — R402132 Coma scale, eyes open, to sound, at arrival to emergency department: Secondary | ICD-10-CM | POA: Diagnosis present

## 2015-08-27 DIAGNOSIS — Z794 Long term (current) use of insulin: Secondary | ICD-10-CM

## 2015-08-27 DIAGNOSIS — R402362 Coma scale, best motor response, obeys commands, at arrival to emergency department: Secondary | ICD-10-CM | POA: Diagnosis present

## 2015-08-27 DIAGNOSIS — J441 Chronic obstructive pulmonary disease with (acute) exacerbation: Secondary | ICD-10-CM | POA: Diagnosis present

## 2015-08-27 DIAGNOSIS — N179 Acute kidney failure, unspecified: Secondary | ICD-10-CM | POA: Diagnosis present

## 2015-08-27 DIAGNOSIS — Z87891 Personal history of nicotine dependence: Secondary | ICD-10-CM

## 2015-08-27 DIAGNOSIS — N390 Urinary tract infection, site not specified: Secondary | ICD-10-CM | POA: Diagnosis present

## 2015-08-27 DIAGNOSIS — G40911 Epilepsy, unspecified, intractable, with status epilepticus: Principal | ICD-10-CM | POA: Diagnosis present

## 2015-08-27 DIAGNOSIS — T426X5A Adverse effect of other antiepileptic and sedative-hypnotic drugs, initial encounter: Secondary | ICD-10-CM | POA: Diagnosis present

## 2015-08-27 DIAGNOSIS — R402242 Coma scale, best verbal response, confused conversation, at arrival to emergency department: Secondary | ICD-10-CM | POA: Diagnosis present

## 2015-08-27 DIAGNOSIS — G9340 Encephalopathy, unspecified: Secondary | ICD-10-CM | POA: Insufficient documentation

## 2015-08-27 DIAGNOSIS — J449 Chronic obstructive pulmonary disease, unspecified: Secondary | ICD-10-CM | POA: Diagnosis present

## 2015-08-27 DIAGNOSIS — E0781 Sick-euthyroid syndrome: Secondary | ICD-10-CM | POA: Diagnosis present

## 2015-08-27 DIAGNOSIS — E1165 Type 2 diabetes mellitus with hyperglycemia: Secondary | ICD-10-CM | POA: Diagnosis present

## 2015-08-27 DIAGNOSIS — E43 Unspecified severe protein-calorie malnutrition: Secondary | ICD-10-CM

## 2015-08-27 DIAGNOSIS — E876 Hypokalemia: Secondary | ICD-10-CM | POA: Diagnosis present

## 2015-08-27 DIAGNOSIS — E119 Type 2 diabetes mellitus without complications: Secondary | ICD-10-CM

## 2015-08-27 DIAGNOSIS — L271 Localized skin eruption due to drugs and medicaments taken internally: Secondary | ICD-10-CM | POA: Diagnosis present

## 2015-08-27 DIAGNOSIS — Z658 Other specified problems related to psychosocial circumstances: Secondary | ICD-10-CM | POA: Insufficient documentation

## 2015-08-27 DIAGNOSIS — J440 Chronic obstructive pulmonary disease with acute lower respiratory infection: Secondary | ICD-10-CM | POA: Diagnosis present

## 2015-08-27 DIAGNOSIS — Y95 Nosocomial condition: Secondary | ICD-10-CM | POA: Diagnosis present

## 2015-08-27 DIAGNOSIS — Z7982 Long term (current) use of aspirin: Secondary | ICD-10-CM

## 2015-08-27 DIAGNOSIS — IMO0001 Reserved for inherently not codable concepts without codable children: Secondary | ICD-10-CM | POA: Insufficient documentation

## 2015-08-27 HISTORY — DX: Unspecified convulsions: R56.9

## 2015-08-27 LAB — CBC
HEMATOCRIT: 35.2 % — AB (ref 36.0–46.0)
HEMOGLOBIN: 12 g/dL (ref 12.0–15.0)
MCH: 30.6 pg (ref 26.0–34.0)
MCHC: 34.1 g/dL (ref 30.0–36.0)
MCV: 89.8 fL (ref 78.0–100.0)
Platelets: 224 10*3/uL (ref 150–400)
RBC: 3.92 MIL/uL (ref 3.87–5.11)
RDW: 13 % (ref 11.5–15.5)
WBC: 5.4 10*3/uL (ref 4.0–10.5)

## 2015-08-27 LAB — COMPREHENSIVE METABOLIC PANEL
ALT: 54 U/L (ref 14–54)
ANION GAP: 9 (ref 5–15)
AST: 84 U/L — AB (ref 15–41)
Albumin: 3.2 g/dL — ABNORMAL LOW (ref 3.5–5.0)
Alkaline Phosphatase: 25 U/L — ABNORMAL LOW (ref 38–126)
BUN: 10 mg/dL (ref 6–20)
CO2: 29 mmol/L (ref 22–32)
Calcium: 8.3 mg/dL — ABNORMAL LOW (ref 8.9–10.3)
Chloride: 101 mmol/L (ref 101–111)
Creatinine, Ser: 0.79 mg/dL (ref 0.44–1.00)
GFR calc Af Amer: >60 mL/min (ref >60)
GLUCOSE: 193 mg/dL — AB (ref 65–99)
POTASSIUM: 3 mmol/L — AB (ref 3.5–5.1)
SODIUM: 139 mmol/L (ref 135–145)
TOTAL PROTEIN: 6 g/dL — AB (ref 6.5–8.1)
Total Bilirubin: 0.7 mg/dL (ref 0.3–1.2)

## 2015-08-27 LAB — TROPONIN I: TROPONIN I: 0.03 ng/mL (ref <0.031)

## 2015-08-27 LAB — GLUCOSE, CAPILLARY
GLUCOSE-CAPILLARY: 308 mg/dL — AB (ref 65–99)
GLUCOSE-CAPILLARY: 139 mg/dL — AB (ref 65–99)
GLUCOSE-CAPILLARY: 65 mg/dL (ref 65–99)
Glucose-Capillary: 154 mg/dL — ABNORMAL HIGH (ref 65–99)

## 2015-08-27 LAB — URINALYSIS, ROUTINE W REFLEX MICROSCOPIC
Bilirubin Urine: NEGATIVE
Glucose, UA: NEGATIVE mg/dL
Hgb urine dipstick: NEGATIVE
Ketones, ur: NEGATIVE mg/dL
LEUKOCYTES UA: NEGATIVE
Nitrite: NEGATIVE
PROTEIN: NEGATIVE mg/dL
Specific Gravity, Urine: 1.01 (ref 1.005–1.030)
pH: 6.5 (ref 5.0–8.0)

## 2015-08-27 LAB — AMMONIA: AMMONIA: 14 umol/L (ref 9–35)

## 2015-08-27 LAB — CBG MONITORING, ED
GLUCOSE-CAPILLARY: 162 mg/dL — AB (ref 65–99)
Glucose-Capillary: 177 mg/dL — ABNORMAL HIGH (ref 65–99)

## 2015-08-27 LAB — MAGNESIUM: Magnesium: 1.5 mg/dL — ABNORMAL LOW (ref 1.7–2.4)

## 2015-08-27 LAB — LACTIC ACID, PLASMA: LACTIC ACID, VENOUS: 1 mmol/L (ref 0.5–2.0)

## 2015-08-27 MED ORDER — SODIUM CHLORIDE 0.9 % IV SOLN
Freq: Once | INTRAVENOUS | Status: AC
  Administered 2015-08-27: 10:00:00 via INTRAVENOUS

## 2015-08-27 MED ORDER — HEPARIN SODIUM (PORCINE) 5000 UNIT/ML IJ SOLN
5000.0000 [IU] | Freq: Three times a day (TID) | INTRAMUSCULAR | Status: DC
  Administered 2015-08-27 – 2015-09-05 (×27): 5000 [IU] via SUBCUTANEOUS
  Filled 2015-08-27 (×27): qty 1

## 2015-08-27 MED ORDER — SODIUM CHLORIDE 0.9 % IV BOLUS (SEPSIS)
1000.0000 mL | Freq: Once | INTRAVENOUS | Status: AC
  Administered 2015-08-27: 1000 mL via INTRAVENOUS

## 2015-08-27 MED ORDER — CYCLOSPORINE 0.05 % OP EMUL
1.0000 [drp] | Freq: Two times a day (BID) | OPHTHALMIC | Status: DC
  Administered 2015-08-27 – 2015-09-05 (×17): 1 [drp] via OPHTHALMIC
  Filled 2015-08-27 (×22): qty 1

## 2015-08-27 MED ORDER — CYCLOSPORINE 0.05 % OP EMUL
OPHTHALMIC | Status: AC
  Filled 2015-08-27: qty 1

## 2015-08-27 MED ORDER — SODIUM CHLORIDE 0.9% FLUSH
3.0000 mL | Freq: Two times a day (BID) | INTRAVENOUS | Status: DC
  Administered 2015-08-27 – 2015-09-02 (×7): 3 mL via INTRAVENOUS
  Administered 2015-09-02: 23:00:00 via INTRAVENOUS
  Administered 2015-09-03 – 2015-09-05 (×5): 3 mL via INTRAVENOUS

## 2015-08-27 MED ORDER — DEXTROSE 50 % IV SOLN
INTRAVENOUS | Status: AC
  Administered 2015-08-27: 0.5
  Filled 2015-08-27: qty 50

## 2015-08-27 MED ORDER — PIPERACILLIN-TAZOBACTAM 3.375 G IVPB 30 MIN
3.3750 g | Freq: Once | INTRAVENOUS | Status: AC
  Administered 2015-08-27: 3.375 g via INTRAVENOUS
  Filled 2015-08-27: qty 50

## 2015-08-27 MED ORDER — ASPIRIN EC 81 MG PO TBEC
81.0000 mg | DELAYED_RELEASE_TABLET | Freq: Every day | ORAL | Status: DC
  Administered 2015-08-31 – 2015-09-05 (×6): 81 mg via ORAL
  Filled 2015-08-27 (×7): qty 1

## 2015-08-27 MED ORDER — VANCOMYCIN HCL 500 MG IV SOLR
500.0000 mg | Freq: Two times a day (BID) | INTRAVENOUS | Status: DC
  Administered 2015-08-28 – 2015-08-29 (×3): 500 mg via INTRAVENOUS
  Filled 2015-08-27 (×5): qty 500

## 2015-08-27 MED ORDER — INSULIN ASPART 100 UNIT/ML ~~LOC~~ SOLN
0.0000 [IU] | SUBCUTANEOUS | Status: DC
  Administered 2015-08-27: 3 [IU] via SUBCUTANEOUS
  Administered 2015-08-28: 15 [IU] via SUBCUTANEOUS
  Administered 2015-08-28: 5 [IU] via SUBCUTANEOUS
  Administered 2015-08-28: 8 [IU] via SUBCUTANEOUS
  Filled 2015-08-27: qty 1

## 2015-08-27 MED ORDER — VANCOMYCIN HCL IN DEXTROSE 1-5 GM/200ML-% IV SOLN
1000.0000 mg | Freq: Once | INTRAVENOUS | Status: AC
  Administered 2015-08-27: 1000 mg via INTRAVENOUS
  Filled 2015-08-27: qty 200

## 2015-08-27 MED ORDER — LEVETIRACETAM 500 MG PO TABS
500.0000 mg | ORAL_TABLET | Freq: Two times a day (BID) | ORAL | Status: DC
  Administered 2015-08-27: 500 mg via ORAL
  Filled 2015-08-27: qty 1

## 2015-08-27 MED ORDER — ALPRAZOLAM 0.5 MG PO TABS
1.0000 mg | ORAL_TABLET | Freq: Three times a day (TID) | ORAL | Status: DC | PRN
  Filled 2015-08-27: qty 2

## 2015-08-27 MED ORDER — POTASSIUM CHLORIDE 10 MEQ/100ML IV SOLN
10.0000 meq | Freq: Once | INTRAVENOUS | Status: AC
  Administered 2015-08-27: 10 meq via INTRAVENOUS
  Filled 2015-08-27: qty 100

## 2015-08-27 MED ORDER — PIPERACILLIN-TAZOBACTAM 3.375 G IVPB
3.3750 g | Freq: Three times a day (TID) | INTRAVENOUS | Status: DC
  Administered 2015-08-28 – 2015-08-29 (×4): 3.375 g via INTRAVENOUS
  Filled 2015-08-27 (×7): qty 50

## 2015-08-27 NOTE — ED Provider Notes (Signed)
CSN: [id number]     Arrival date & time 08/27/15  [id number] History  By signing my name below, I, Roxine Caddy, attest that this documentation has been prepared under the direction and in the presence of Elnora Morrison, MD.  Electronically signed: Roxine Caddy, ED Scribe. 08/27/2015. 11:42 AM.    Chief Complaint  Patient presents with  . Altered Mental Status   LEVEL 5 CAVEAT DUE TO ALTERED MENTAL STATUS The history is provided by the spouse. No language interpreter was used.   HPI Comments: Veronica Moses is a 60 y.o. female  with PMHx of DM, and seizures brought in by her husband who presents to the Emergency Department with altered mental status with onset earlier today per husband. Husband states pt was nonverbal, he took her blood sugar which was 44 earlier this morning. He gave her something eat and drink. Husband states pt was brought to the ED 7 days ago for having multiple seizures back to back. Pt was admitted to the hospital and remained in ICU for 3 days. Her husband notes she was discharged from ICU 2 days ago and responded to treatment well. Husband also notes pt has had interrupted sleeping cycles since hospitalization.  Husband states pt urinated on herself in the bed last night, but denies convulsions.Per husband, pt is currently taking ibuprofen for pain and Keppra for new seizure diagnosis. He denies rash, cough, SOB, vomiting, diarrhea, or h/o liver disease. Pt was not started on any other new medications.   Past Medical History  Diagnosis Date  . COPD (chronic obstructive pulmonary disease) ([place])   . Diabetes mellitus   . UTI (lower urinary tract infection)   . Seizures [facility])    Past Surgical History  Procedure Laterality Date  . Abdominal hysterectomy    . Nephrectomy    . Cholecystectomy    . Breast surgery    . Colonoscopy with esophagogastroduodenoscopy (egd) N/A 03/04/2013    Procedure: COLONOSCOPY WITH ESOPHAGOGASTRODUODENOSCOPY (EGD);  Surgeon: Rogene Houston, MD;   Location: [facility]O [facility];  Service: Endoscopy;  Laterality: N/A;  1200-moved to [place] notified pt   Family History  Problem Relation Age of Onset  . Colon cancer Neg Hx   . CAD      family history  . Diabetes Father   . Diabetes Brother    Social History  Substance Use Topics  . Smoking status: Former Smoker -- 1.00 packs/day for 26 years    Types: Cigarettes  . Smokeless tobacco: Former Systems developer    Quit date: 05/15/2012  . Alcohol Use: No   OB History    No data available     Review of Systems  Unable to perform ROS: Mental status change    Allergies  Demerol and Dilaudid  Home Medications   Prior to Admission medications   Medication Sig Start Date End Date Taking? Authorizing Provider  acetaminophen (TYLENOL) 500 MG tablet Take 1,000 mg by mouth every 6 (six) hours as needed for mild pain or moderate pain.   Yes Historical Provider, MD  ALPRAZolam Duanne Moron) 1 MG tablet Take 1 mg by mouth 3 (three) times daily as needed. For anxiety   Yes Historical Provider, MD  aspirin EC 81 MG tablet Take 81 mg by mouth daily.   Yes Historical Provider, MD  cyclobenzaprine (FLEXERIL) 10 MG tablet Take 1 tablet (10 mg total) by mouth 3 (three) times daily as needed for muscle spasms. 05/31/14  Yes Orpah Greek, MD  HYDROcodone-acetaminophen (  NORCO/VICODIN) 5-325 MG tablet Take 1 tablet by mouth every 6 (six) hours as needed for moderate pain. 08/25/15  Yes Sinda Du, MD  ibuprofen (ADVIL) 200 MG tablet Take 1 tablet (200 mg total) by mouth every 6 (six) hours as needed. 08/25/15  Yes Sinda Du, MD  insulin glargine (LANTUS) 100 UNIT/ML injection Inject 22 Units into the skin every morning.    Yes Historical Provider, MD  levETIRAcetam (KEPPRA) 500 MG tablet Take 1 tablet (500 mg total) by mouth 2 (two) times daily. 08/25/15  Yes Sinda Du, MD  NOVOLOG FLEXPEN 100 UNIT/ML FlexPen Please use the hospital sliding scale provided for you 08/25/15  Yes Sinda Du, MD   RESTASIS 0.05 % ophthalmic emulsion Apply 1 drop to eye 2 (two) times daily. 04/29/14  Yes Historical Provider, MD   BP 157/51 mmHg  Pulse 55  Temp(Src) 100 F (37.8 C) (Rectal)  Resp 19  Ht 5\' 2"  (1.575 m)  Wt 115 lb (52.164 kg)  BMI 21.03 kg/m2  SpO2 94% Physical Exam  Constitutional: She appears well-developed and well-nourished. No distress.  General fatigue apperance  HENT:  Head: Normocephalic and atraumatic.  Eyes: Conjunctivae are normal. Pupils are equal, round, and reactive to light.  Pupils 4mm equal and responsive to light  Neck: Normal range of motion. Neck supple.  No meningismus  Cardiovascular: Regular rhythm and normal heart sounds.   Pulmonary/Chest: Effort normal. No respiratory distress. She has no wheezes. She has rales.  Few cackles at left base No respiratory difficulty  Abdominal: Soft. She exhibits no distension. There is no tenderness.  Neurological: She is alert. GCS eye subscore is 3. GCS verbal subscore is 4. GCS motor subscore is 6.  Pt follows commands to loud verbal stimulation Alert to name No obvious arm drift with equal strength BUE  Raises all extremities easily, but lower extremities will gradually lower Responds to pain with grimace Challeging neuro exam due to presentation  Skin: Skin is warm and dry.  Psychiatric: She has a normal mood and affect.  Nursing note and vitals reviewed.   ED Course  Procedures  Emergency Ultrasound Study:   Angiocath insertion Performed by: Mariea Clonts  Consent: Verbal consent obtained. Risks and benefits: risks, benefits and alternatives were discussed Immediately prior to procedure the correct patient, procedure, equipment, support staff and site/side marked as needed.  Indication: difficult IV access Preparation: Patient was prepped and draped in the usual sterile fashion. Vein Location: right ac vein was visualized during assessment for potential access sites and was found to be patent/  easily compressed with linear ultrasound.  The needle was visualized with real-time ultrasound and guided into the vein. Gauge: 20 g  Image saved and stored.  Normal blood return.  Patient tolerance: Patient tolerated the procedure well with no immediate complications.    DIAGNOSTIC STUDIES: Oxygen Saturation is 97% on RA, normal by my interpretation.  COORDINATION OF CARE: 11:42 AM Discussed treatment plan which includes administering IV medication, and lab work with pt at bedside and pt agreed to plan.  Labs Review Labs Reviewed  COMPREHENSIVE METABOLIC PANEL - Abnormal; Notable for the following:    Potassium 3.0 (*)    Glucose, Bld 193 (*)    Calcium 8.3 (*)    Total Protein 6.0 (*)    Albumin 3.2 (*)    AST 84 (*)    Alkaline Phosphatase 25 (*)    All other components within normal limits  CBC - Abnormal; Notable for the  following:    HCT 35.2 (*)    All other components within normal limits  MAGNESIUM - Abnormal; Notable for the following:    Magnesium 1.5 (*)    All other components within normal limits  CBG MONITORING, ED - Abnormal; Notable for the following:    Glucose-Capillary 177 (*)    All other components within normal limits  CULTURE, BLOOD (ROUTINE X 2)  CULTURE, BLOOD (ROUTINE X 2)  LACTIC ACID, PLASMA  URINALYSIS, ROUTINE W REFLEX MICROSCOPIC (NOT AT [facility])  AMMONIA  TROPONIN I    Imaging Review Dg Chest 2 View  08/27/2015  CLINICAL DATA:  Altered mental status. Crackles in the left lung base. EXAM: CHEST  2 VIEW COMPARISON:  Single-view of the chest 08/22/2015 and 08/20/2015. PA and lateral chest 05/21/2015. FINDINGS: Streaky airspace disease is seen in the lingula and left lung base. Minimal right basilar atelectasis is noted. Heart size is normal. No pneumothorax or pleural effusion. IMPRESSION: Streaky lingular and left basilar airspace disease could be due to atelectasis or pneumonia. Electronically Signed   By: Inge Rise M.D.   On: 08/27/2015  10:15   Ct Head Wo Contrast  08/27/2015  CLINICAL DATA:  Altered mental status for 1 day. History of seizures. EXAM: CT HEAD WITHOUT CONTRAST TECHNIQUE: Contiguous axial images were obtained from the base of the skull through the vertex without intravenous contrast. COMPARISON:  Head CT May 31, 2014 and brain MRI August 23, 2015 FINDINGS: The ventricles are normal in size and configuration. There is slight frontal atrophy bilaterally. There is no intracranial mass, hemorrhage, extra-axial fluid collection, or midline shift. The gray-white compartments appear normal. No acute infarct evident. The bony calvarium appears intact. Mastoids on the left are clear. The mastoids on the right are largely clear with several opacified inferior right mastoid air cells. No intraorbital lesions are evident in the visualized orbital regions. IMPRESSION: Mild frontal atrophy bilaterally. Ventricles normal in size and configuration. No intracranial mass, hemorrhage, or evidence of focal infarct. Mild inferior mastoid disease on the right. Mastoids elsewhere clear. Electronically Signed   By: Lowella Grip III M.D.   On: 08/27/2015 10:43   I have personally reviewed and evaluated these images and lab results as part of my medical decision-making.   EKG Interpretation   Date/Time:  Monday August 27 2015 09:05:53 EDT Ventricular Rate:  55 PR Interval:  100 QRS Duration: 92 QT Interval:  428 QTC Calculation: 409 R Axis:   49 Text Interpretation:  Sinus rhythm Short PR interval Abnormal R-wave  progression, early transition Confirmed by Porter Nakama MD, Estellar Cadena [phone]) on  08/27/2015 11:06:08 AM      MDM   Final diagnoses:  Acute encephalopathy  Hypokalemia  HCAP (healthcare-associated pneumonia)   Patient with recent admission to the ICU presents with acute encephalopathy. Broad differential at this time no signs of meningitis on exam, recent lumbar puncture results reviewed negative. Plan for blood work,  urinalysis, chest x-ray and a CT scan of the head and admission to the hospital. Patient still not at baseline, minimal verbal expressive on exam.  NO change on reassessment.  Clinical/ CXR concern for early pneumonia, HCAP coverage.  The patients results and plan were reviewed and discussed.   Any x-rays performed were independently reviewed by myself.   Differential diagnosis were considered with the presenting HPI.  Medications  potassium chloride 10 mEq in 100 mL IVPB (not administered)  potassium chloride 10 mEq in 100 mL IVPB (10 mEq Intravenous New  Bag/Given 08/27/15 1135)  piperacillin-tazobactam (ZOSYN) IVPB 3.375 g (3.375 g Intravenous New Bag/Given 08/27/15 1135)  vancomycin (VANCOCIN) IVPB 1000 mg/200 mL premix (not administered)  0.9 %  sodium chloride infusion ( Intravenous New Bag/Given 08/27/15 0942)  sodium chloride 0.9 % bolus 1,000 mL (1,000 mLs Intravenous New Bag/Given 08/27/15 1135)    Filed Vitals:   08/27/15 0930 08/27/15 0935 08/27/15 1030 08/27/15 1100  BP: 146/56  156/50 157/51  Pulse: 56  53 55  Temp:  100 F (37.8 C)    TempSrc:  Rectal    Resp: 17  17 19   Height:      Weight:      SpO2: 94%  96% 94%    Final diagnoses:  Acute encephalopathy  Hypokalemia  HCAP (healthcare-associated pneumonia)    Admission/ observation were discussed with the admitting physician, patient and/or family and they are comfortable with the plan.    Elnora Morrison, MD 08/27/15 1143

## 2015-08-27 NOTE — Progress Notes (Signed)
Pharmacy Antibiotic Note  Veronica Moses is a 60 y.o. female admitted on 08/27/2015 with pneumonia.  Pharmacy has been consulted for Vancomycin and Zosyn dosing.  Plan: Vancomycin 1gm loading dose given in ED, then 500mg  IV every 12 hours.  Goal trough 15-20 mcg/mL. Zosyn 3.375g IV q8h (4 hour infusion).  Height: 5\' 1"  (154.9 cm) Weight: 124 lb 12.8 oz (56.609 kg) IBW/kg (Calculated) : 47.8  Temp (24hrs), Avg:99.1 F (37.3 C), Min:98 F (36.7 C), Max:100 F (37.8 C)   Recent Labs Lab 08/20/15 1750 08/20/15 1801 08/20/15 2326 08/21/15 0239  08/21/15 1041 08/21/15 1511 08/21/15 1849 08/21/15 2236 08/22/15 0737 08/27/15 0924  WBC 14.5*  --   --  9.3  --   --  9.3  --  8.1  --  5.4  CREATININE 1.39*  --  1.17* 1.08*  < > 0.95 0.91 0.93  --  1.03* 0.79  LATICACIDVEN  --  15.24* 2.0 2.7*  --   --   --   --   --   --  1.0  < > = values in this interval not displayed.  Estimated Creatinine Clearance: 56.4 mL/min (by C-G formula based on Cr of 0.79).    Allergies  Allergen Reactions  . Demerol Other (See Comments)    hallucinations  . Dilaudid [Hydromorphone Hcl] Nausea And Vomiting    Antimicrobials this admission: Vancomycin 6/12 >>  Zosyn 6/12 >>   Microbiology results: 6/12 BCx:  Thank you for allowing pharmacy to be a part of this patient's care. Isac Sarna, BS Pharm D, [place] Clinical Pharmacist Pager [phone]  08/27/2015 5:27 PM

## 2015-08-27 NOTE — H&P (Signed)
[facility] History and Physical  Veronica Moses [id number] DOB: 04/26/1955    PCP:   Alonza Bogus, MD   Chief Complaint:  altered mental status.   HPI: Veronica Moses is an 60 y.o. female with hx of COPD, DM, recent admission for status epilepticus, seen by neurology, and had workup to include LP, MRI, CT, Carotid US, ECHO without any identifiable cause of her seizure.  She was subsequently given Keppra, and did not have any further seizure activities.  She was subsequently discharged to home, but brought back today being lethargic and found to have urinary incontinence.  Evaluation in the ER included K of 3.0, normal renal Fx, no leukocytosis, normal NH3 level, and negative troponins.   Her CXR showed an infiltrate, and her head CT showed nothing acute.  She was felt to have another seizure, and possibly had aspiration.  She was given IV Van/Zosyn, and hospitalist was asked to admit her for further Tx.   Rewiew of Systems:  Unable.   She is quite lethargic.     Past Medical History  Diagnosis Date  . COPD (chronic obstructive pulmonary disease) ([place])   . Diabetes mellitus   . UTI (lower urinary tract infection)   . Seizures [facility])     Past Surgical History  Procedure Laterality Date  . Abdominal hysterectomy    . Nephrectomy    . Cholecystectomy    . Breast surgery    . Colonoscopy with esophagogastroduodenoscopy (egd) N/A 03/04/2013    Procedure: COLONOSCOPY WITH ESOPHAGOGASTRODUODENOSCOPY (EGD);  Surgeon: Rogene Houston, MD;  Location[facility];  Service: Endoscopy;  Laterality: N/A;  1200-moved to [place] notified pt    Medications:  HOME MEDS: Prior to Admission medications   Medication Sig Start Date End Date Taking? Authorizing Provider  acetaminophen (TYLENOL) 500 MG tablet Take 1,000 mg by mouth every 6 (six) hours as needed for mild pain or moderate pain.   Yes Historical Provider, MD  ALPRAZolam Duanne Moron) 1 MG tablet Take 1 mg by mouth 3 (three)  times daily as needed. For anxiety   Yes Historical Provider, MD  aspirin EC 81 MG tablet Take 81 mg by mouth daily.   Yes Historical Provider, MD  cyclobenzaprine (FLEXERIL) 10 MG tablet Take 1 tablet (10 mg total) by mouth 3 (three) times daily as needed for muscle spasms. 05/31/14  Yes Orpah Greek, MD  HYDROcodone-acetaminophen (NORCO/VICODIN) 5-325 MG tablet Take 1 tablet by mouth every 6 (six) hours as needed for moderate pain. 08/25/15  Yes Sinda Du, MD  ibuprofen (ADVIL) 200 MG tablet Take 1 tablet (200 mg total) by mouth every 6 (six) hours as needed. 08/25/15  Yes Sinda Du, MD  insulin glargine (LANTUS) 100 UNIT/ML injection Inject 22 Units into the skin every morning.    Yes Historical Provider, MD  levETIRAcetam (KEPPRA) 500 MG tablet Take 1 tablet (500 mg total) by mouth 2 (two) times daily. 08/25/15  Yes Sinda Du, MD  NOVOLOG FLEXPEN 100 UNIT/ML FlexPen Please use the hospital sliding scale provided for you 08/25/15  Yes Sinda Du, MD  RESTASIS 0.05 % ophthalmic emulsion Apply 1 drop to eye 2 (two) times daily. 04/29/14  Yes Historical Provider, MD     Allergies:  Allergies  Allergen Reactions  . Demerol Other (See Comments)    hallucinations  . Dilaudid [Hydromorphone Hcl] Nausea And Vomiting    Social History:   reports that she has quit smoking. Her smoking use included Cigarettes. She  has a 26 pack-year smoking history. She quit smokeless tobacco use about 3 years ago. She reports that she does not drink alcohol or use illicit drugs.  Family History: Family History  Problem Relation Age of Onset  . Colon cancer Neg Hx   . CAD      family history  . Diabetes Father   . Diabetes Brother      Physical Exam: Filed Vitals:   08/27/15 1215 08/27/15 1230 08/27/15 1300 08/27/15 1330  BP:  153/53 112/59 144/54  Pulse: 55 59 57 52  Temp:      TempSrc:      Resp:  17 21 17   Height:      Weight:      SpO2: 94% 95% 93% 91%   Blood pressure  144/54, pulse 52, temperature 100 F (37.8 C), temperature source Rectal, resp. rate 17, height 5\' 2"  (1.575 m), weight 52.164 kg (115 lb), SpO2 91 %.  GEN:   patient lying in the stretcher in no acute distress; lethargic.  HEENT: Mucous membranes pink and anicteric; PERRLA; EOM intact; no cervical lymphadenopathy nor thyromegaly or carotid bruit; no JVD; There were no stridor. Neck is very supple. Breasts:: Not examined CHEST WALL: No tenderness CHEST: Normal respiration, clear to auscultation bilaterally.  HEART: Regular rate and rhythm.  There are no murmur, rub, or gallops.   BACK: No kyphosis or scoliosis; no CVA tenderness ABDOMEN: soft and non-tender; no masses, no organomegaly, normal abdominal bowel sounds; no pannus; no intertriginous candida. There is no rebound and no distention. Rectal Exam: Not done EXTREMITIES: No bone or joint deformity; age-appropriate arthropathy of the hands and knees; no edema; no ulcerations.  There is no calf tenderness. Genitalia: not examined PULSES: 2+ and symmetric SKIN: Normal hydration no rash or ulceration CNS: Opened her eyes, but doesn't converse, and doesn't respond.  Labs on Admission:  Basic Metabolic Panel:  Recent Labs Lab 08/21/15 1041 08/21/15 1511 08/21/15 1849 08/22/15 0737 08/27/15 0924  NA 136 134* 133* 138 139  K 3.1* 3.7 3.2* 3.2* 3.0*  CL 107 106 107 110 101  CO2 23 22 20* 23 29  GLUCOSE 159* 192* 179* 187* 193*  BUN 11 10 10 10 10   CREATININE 0.95 0.91 0.93 1.03* 0.79  CALCIUM 8.2* 8.5* 8.4* 8.4* 8.3*  MG  --   --   --   --  1.5*   Liver Function Tests:  Recent Labs Lab 08/20/15 1750 08/27/15 0924  AST 34 84*  ALT 21 54  ALKPHOS 32* 25*  BILITOT 0.4 0.7  PROT 6.8 6.0*  ALBUMIN 3.8 3.2*    Recent Labs Lab 08/27/15 0924  AMMONIA 14   CBC:  Recent Labs Lab 08/20/15 1750 08/21/15 0239 08/21/15 1511 08/21/15 2236 08/27/15 0924  WBC 14.5* 9.3 9.3 8.1 5.4  NEUTROABS 10.0*  --   --   --   --    HGB 13.1 11.2* 11.6* 10.9* 12.0  HCT 40.9 33.4* 34.3* 31.6* 35.2*  MCV 93.0 89.5 88.6 88.0 89.8  PLT 271 159 130* 133* 224   Cardiac Enzymes:  Recent Labs Lab 08/20/15 1750  08/21/15 0731 08/21/15 1041 08/21/15 1511 08/21/15 2236 08/27/15 0924  CKTOTAL 60  --   --   --   --   --   --   TROPONINI 0.06*  < > 0.39* 0.34* 0.53* 0.35* 0.03  < > = values in this interval not displayed.  CBG:  Recent Labs Lab 08/24/15 1703 08/24/15 2056  08/25/15 0740 08/25/15 1149 08/27/15 0901  GLUCAP 317* 215* 84 161* 177*     Radiological Exams on Admission: Dg Chest 2 View  08/27/2015  CLINICAL DATA:  Altered mental status. Crackles in the left lung base. EXAM: CHEST  2 VIEW COMPARISON:  Single-view of the chest 08/22/2015 and 08/20/2015. PA and lateral chest 05/21/2015. FINDINGS: Streaky airspace disease is seen in the lingula and left lung base. Minimal right basilar atelectasis is noted. Heart size is normal. No pneumothorax or pleural effusion. IMPRESSION: Streaky lingular and left basilar airspace disease could be due to atelectasis or pneumonia. Electronically Signed   By: Inge Rise M.D.   On: 08/27/2015 10:15   Ct Head Wo Contrast  08/27/2015  CLINICAL DATA:  Altered mental status for 1 day. History of seizures. EXAM: CT HEAD WITHOUT CONTRAST TECHNIQUE: Contiguous axial images were obtained from the base of the skull through the vertex without intravenous contrast. COMPARISON:  Head CT May 31, 2014 and brain MRI August 23, 2015 FINDINGS: The ventricles are normal in size and configuration. There is slight frontal atrophy bilaterally. There is no intracranial mass, hemorrhage, extra-axial fluid collection, or midline shift. The gray-white compartments appear normal. No acute infarct evident. The bony calvarium appears intact. Mastoids on the left are clear. The mastoids on the right are largely clear with several opacified inferior right mastoid air cells. No intraorbital lesions are  evident in the visualized orbital regions. IMPRESSION: Mild frontal atrophy bilaterally. Ventricles normal in size and configuration. No intracranial mass, hemorrhage, or evidence of focal infarct. Mild inferior mastoid disease on the right. Mastoids elsewhere clear. Electronically Signed   By: Lowella Grip III M.D.   On: 08/27/2015 10:43   Assessment/Plan Present on Admission:  . HCAP (healthcare-associated pneumonia) . Protein-calorie malnutrition, severe ([place]) . COPD (chronic obstructive pulmonary disease) ([facility])  PLAN:  1/ Altered mental status:  Suspect she had another seizure episode.  Will admit to telemetry and continue with her anticonvulsive medication.   Will consult neurology.  Will resume her meds when she is more awake.   2/ Possible aspiration PNA:  She may have aspirated under this clinical scenario.  It is reasonable to cover broadly at this time.  3/  COPD:  Stable.   Will continue with her meds.    Other plans as per orders. Code Status: FULL Haskel Khan, MD. FACP [facility] Pager [phone] 7pm to 7am.  08/27/2015, 1:39 PM

## 2015-08-27 NOTE — ED Notes (Addendum)
Per pt husband, pt was admitted for new onset seizures last week and was discharge home end of last week. Pt husband reports last seizure on Monday. Pt reports pt was at baseline last night at 1030 and reports was found staring off into space this am around 7am. Pt husband reports pt cbg was 44 at home, pt was given a soft drink. CBG rechecked at 840 with a result of 206. CBG in ED 144. Pt alert and follows commands. Pt answers yes or no to assessment questions. Pt reactive to painful stimuli.

## 2015-08-27 NOTE — H&P (Signed)
Patient's family had requested that she be transferred to [facility].   I spoke with Dr Edward Jolly to contact [facility] to have transfer as per patient's request.    Family subsequently approached me and said no transfer is necessary, and they would like to transfer the request.  I informed Dr Edward Jolly to cancel the transfer request.   When she was admitted to the Floor, RN called me and said family whom I have met would like to have her transfer to [facility]  Reason for the request was to see another neurologist.   I spoke with the oncall Neurologist at [facility] and Dr Kandice Robinsons was most kind to agree to see her in consultation.   She will be transferred via [facility] to the hospitalist service.  Dr Stanford Breed is the accepting physician.    Thank you,  Orvan Falconer, MD.  Rosalita Chessman. Hospitalist.

## 2015-08-28 LAB — CBC
HEMATOCRIT: 33 % — AB (ref 36.0–46.0)
HEMOGLOBIN: 11.5 g/dL — AB (ref 12.0–15.0)
MCH: 30.9 pg (ref 26.0–34.0)
MCHC: 34.8 g/dL (ref 30.0–36.0)
MCV: 88.7 fL (ref 78.0–100.0)
PLATELETS: 234 10*3/uL (ref 150–400)
RBC: 3.72 MIL/uL — AB (ref 3.87–5.11)
RDW: 13 % (ref 11.5–15.5)
WBC: 5.9 10*3/uL (ref 4.0–10.5)

## 2015-08-28 LAB — GLUCOSE, CAPILLARY
GLUCOSE-CAPILLARY: 131 mg/dL — AB (ref 65–99)
GLUCOSE-CAPILLARY: 36 mg/dL — AB (ref 65–99)
GLUCOSE-CAPILLARY: 176 mg/dL — AB (ref 65–99)
GLUCOSE-CAPILLARY: 339 mg/dL — AB (ref 65–99)
GLUCOSE-CAPILLARY: 342 mg/dL — AB (ref 65–99)
GLUCOSE-CAPILLARY: 231 mg/dL — AB (ref 65–99)
Glucose-Capillary: 285 mg/dL — ABNORMAL HIGH (ref 65–99)
Glucose-Capillary: 360 mg/dL — ABNORMAL HIGH (ref 65–99)

## 2015-08-28 LAB — BLOOD GAS, ARTERIAL
Acid-Base Excess: 3.4 mmol/L — ABNORMAL HIGH (ref 0.0–2.0)
BICARBONATE: 27.6 meq/L — AB (ref 20.0–24.0)
Drawn by: 234301
FIO2: 21
O2 Saturation: 93.6 %
PCO2 ART: 35.7 mmHg (ref 35.0–45.0)
PH ART: 7.486 — AB (ref 7.350–7.450)
PO2 ART: 69.8 mmHg — AB (ref 80.0–100.0)
Patient temperature: 37

## 2015-08-28 LAB — BASIC METABOLIC PANEL
ANION GAP: 6 (ref 5–15)
BUN: 8 mg/dL (ref 6–20)
CHLORIDE: 107 mmol/L (ref 101–111)
CO2: 26 mmol/L (ref 22–32)
Calcium: 8.3 mg/dL — ABNORMAL LOW (ref 8.9–10.3)
Creatinine, Ser: 0.73 mg/dL (ref 0.44–1.00)
GFR calc non Af Amer: >60 mL/min (ref >60)
Glucose, Bld: 123 mg/dL — ABNORMAL HIGH (ref 65–99)
POTASSIUM: 2.6 mmol/L — AB (ref 3.5–5.1)
SODIUM: 139 mmol/L (ref 135–145)

## 2015-08-28 MED ORDER — POTASSIUM CHLORIDE 10 MEQ/100ML IV SOLN
10.0000 meq | INTRAVENOUS | Status: AC
Start: 2015-08-28 — End: 2015-08-28
  Administered 2015-08-28 (×6): 10 meq via INTRAVENOUS
  Filled 2015-08-28 (×3): qty 100

## 2015-08-28 MED ORDER — LORAZEPAM 2 MG/ML IJ SOLN
1.0000 mg | INTRAMUSCULAR | Status: DC | PRN
  Administered 2015-08-28: 1 mg via INTRAVENOUS
  Administered 2015-08-28 – 2015-08-29 (×3): 2 mg via INTRAVENOUS
  Administered 2015-08-29 – 2015-09-05 (×4): 1 mg via INTRAVENOUS
  Filled 2015-08-28 (×9): qty 1

## 2015-08-28 MED ORDER — DEXTROSE 50 % IV SOLN
INTRAVENOUS | Status: AC
  Administered 2015-08-28: 50 mL
  Filled 2015-08-28: qty 50

## 2015-08-28 MED ORDER — SODIUM CHLORIDE 0.9 % IV SOLN
500.0000 mg | Freq: Two times a day (BID) | INTRAVENOUS | Status: DC
  Administered 2015-08-28 – 2015-09-03 (×12): 500 mg via INTRAVENOUS
  Filled 2015-08-28 (×16): qty 5

## 2015-08-28 MED ORDER — SODIUM CHLORIDE 0.9 % IV BOLUS (SEPSIS): 500.0000 mL | Freq: Once | INTRAVENOUS | Status: DC

## 2015-08-28 NOTE — Progress Notes (Signed)
Patient being transported by [facility] and report given to [employer] on 5W at [place].

## 2015-08-28 NOTE — Progress Notes (Signed)
Critical K of 2.6 called by lab. MD on call made aware.

## 2015-08-28 NOTE — Progress Notes (Signed)
Inpatient Diabetes Program Recommendations  AACE/ADA: New Consensus Statement on Inpatient Glycemic Control (2015)  Target Ranges:  Prepandial:   less than 140 mg/dL      Peak postprandial:   less than 180 mg/dL (1-2 hours)      Critically ill patients:  140 - 180 mg/dL  Results for ELISCIA, HARTIN (MRN [id number]) as of 08/28/2015 08:39  Ref. Range 08/27/2015 09:01 08/27/2015 13:48 08/27/2015 17:36 08/27/2015 18:24 08/27/2015 20:22 08/28/2015 00:38 08/28/2015 04:15 08/28/2015 07:28 08/28/2015 08:33  Glucose-Capillary Latest Ref Range: 65-99 mg/dL 177 (H) 162 (H)  Novolog 3 units @ 14:30 65 139 (H) 154 (H) 360 (H)  Novolog 15 units @ 1:43 131 (H) 36 (LL) 176 (H)    Review of Glycemic Control  Diabetes history: DM2 Outpatient Diabetes medications: Lantus 22 units QAM, Novolog (no dose or frequency noted on home list) Current orders for Inpatient glycemic control: Novolog 0-15 units Q4H  Inpatient Diabetes Program Recommendations: Correction (SSI): Please decrease Novolog correction to sensitive scale (0-9 units Q4H).  Thanks, Barnie Alderman, RN, MSN, CDE Diabetes Coordinator Inpatient Diabetes Program [phone] (Team Pager from [place] to [place]) [phone] ([facility] office) [phone] [facility] office) [phone] [facility] office)

## 2015-08-28 NOTE — Progress Notes (Signed)
Subjective: Patient was admitted due to recurrent seziure. Patient also hypokalemia and she is being supplemented. Patient is accepted for transfer to [facility] hospital according to the request of the family.  Objective: Vital signs in last 24 hours: Temp:  [98 F (36.7 C)-100 F (37.8 C)] 98.7 F (37.1 C) (06/13 0746) Pulse Rate:  [52-75] 75 (06/13 0800) Resp:  [17-41] 21 (06/13 0800) BP: (94-157)/(47-98) 94/69 mmHg (06/13 0800) SpO2:  [90 %-97 %] 94 % (06/13 0800) Weight:  [52.164 kg (115 lb)-56.609 kg (124 lb 12.8 oz)] 55.1 kg (121 lb 7.6 oz) (06/13 0408) Weight change:  Last BM Date: 08/25/15  Intake/Output from previous day: 06/12 0701 - 06/13 0700 In: 100 [IV Piggyback:100] Out: -   PHYSICAL EXAM General appearance: fatigued and slowed mentation Resp: clear to auscultation bilaterally Cardio: S1, S2 normal GI: soft, non-tender; bowel sounds normal; no masses,  no organomegaly Extremities: extremities normal, atraumatic, no cyanosis or edema  Lab Results:  Results for orders placed or performed during the hospital encounter of 08/27/15 (from the past 48 hour(s))  CBG monitoring, ED     Status: Abnormal   Collection Time: 08/27/15  9:01 AM  Result Value Ref Range   Glucose-Capillary 177 (H) 65 - 99 mg/dL  Comprehensive metabolic panel     Status: Abnormal   Collection Time: 08/27/15  9:24 AM  Result Value Ref Range   Sodium 139 135 - 145 mmol/L   Potassium 3.0 (L) 3.5 - 5.1 mmol/L   Chloride 101 101 - 111 mmol/L   CO2 29 22 - 32 mmol/L   Glucose, Bld 193 (H) 65 - 99 mg/dL   BUN 10 6 - 20 mg/dL   Creatinine, Ser 9.57 0.44 - 1.00 mg/dL   Calcium 8.3 (L) 8.9 - 10.3 mg/dL   Total Protein 6.0 (L) 6.5 - 8.1 g/dL   Albumin 3.2 (L) 3.5 - 5.0 g/dL   AST 84 (H) 15 - 41 U/L   ALT 54 14 - 54 U/L   Alkaline Phosphatase 25 (L) 38 - 126 U/L   Total Bilirubin 0.7 0.3 - 1.2 mg/dL   GFR calc non Af Amer >60 >60 mL/min   GFR calc Af Amer >60 >60 mL/min    Comment: (NOTE) The eGFR  has been calculated using the CKD EPI equation. This calculation has not been validated in all clinical situations. eGFR's persistently <60 mL/min signify possible Chronic Kidney Disease.    Anion gap 9 5 - 15  CBC     Status: Abnormal   Collection Time: 08/27/15  9:24 AM  Result Value Ref Range   WBC 5.4 4.0 - 10.5 K/uL   RBC 3.92 3.87 - 5.11 MIL/uL   Hemoglobin 12.0 12.0 - 15.0 g/dL   HCT 34.6 (L) 12.4 - 71.5 %   MCV 89.8 78.0 - 100.0 fL   MCH 30.6 26.0 - 34.0 pg   MCHC 34.1 30.0 - 36.0 g/dL   RDW 26.7 42.2 - 84.2 %   Platelets 224 150 - 400 K/uL  Lactic acid, plasma     Status: None   Collection Time: 08/27/15  9:24 AM  Result Value Ref Range   Lactic Acid, Venous 1.0 0.5 - 2.0 mmol/L  Ammonia     Status: None   Collection Time: 08/27/15  9:24 AM  Result Value Ref Range   Ammonia 14 9 - 35 umol/L  Magnesium     Status: Abnormal   Collection Time: 08/27/15  9:24 AM  Result Value  Ref Range   Magnesium 1.5 (L) 1.7 - 2.4 mg/dL  Troponin I     Status: None   Collection Time: 08/27/15  9:24 AM  Result Value Ref Range   Troponin I 0.03 <0.031 ng/mL    Comment:        NO INDICATION OF MYOCARDIAL INJURY.   Urinalysis, Routine w reflex microscopic (not at [facility])     Status: None   Collection Time: 08/27/15 11:31 AM  Result Value Ref Range   Color, Urine YELLOW YELLOW   APPearance CLEAR CLEAR   Specific Gravity, Urine 1.010 1.005 - 1.030   pH 6.5 5.0 - 8.0   Glucose, UA NEGATIVE NEGATIVE mg/dL   Hgb urine dipstick NEGATIVE NEGATIVE   Bilirubin Urine NEGATIVE NEGATIVE   Ketones, ur NEGATIVE NEGATIVE mg/dL   Protein, ur NEGATIVE NEGATIVE mg/dL   Nitrite NEGATIVE NEGATIVE   Leukocytes, UA NEGATIVE NEGATIVE    Comment: MICROSCOPIC NOT DONE ON URINES WITH NEGATIVE PROTEIN, BLOOD, LEUKOCYTES, NITRITE, OR GLUCOSE <1000 mg/dL.  CBG monitoring, ED     Status: Abnormal   Collection Time: 08/27/15  1:48 PM  Result Value Ref Range   Glucose-Capillary 162 (H) 65 - 99 mg/dL   Glucose, capillary     Status: None   Collection Time: 08/27/15  5:36 PM  Result Value Ref Range   Glucose-Capillary 65 65 - 99 mg/dL   Comment 1 Notify RN    Comment 2 Document in Chart   Glucose, capillary     Status: Abnormal   Collection Time: 08/27/15  6:24 PM  Result Value Ref Range   Glucose-Capillary 139 (H) 65 - 99 mg/dL   Comment 1 Notify RN    Comment 2 Document in Chart   Glucose, capillary     Status: Abnormal   Collection Time: 08/27/15  8:22 PM  Result Value Ref Range   Glucose-Capillary 154 (H) 65 - 99 mg/dL   Comment 1 Notify RN    Comment 2 Document in Chart   Glucose, capillary     Status: Abnormal   Collection Time: 08/28/15 12:38 AM  Result Value Ref Range   Glucose-Capillary 360 (H) 65 - 99 mg/dL   Comment 1 Notify RN    Comment 2 Document in Chart   Glucose, capillary     Status: Abnormal   Collection Time: 08/28/15  4:15 AM  Result Value Ref Range   Glucose-Capillary 131 (H) 65 - 99 mg/dL  Basic metabolic panel     Status: Abnormal   Collection Time: 08/28/15  4:34 AM  Result Value Ref Range   Sodium 139 135 - 145 mmol/L   Potassium 2.6 (LL) 3.5 - 5.1 mmol/L    Comment: CRITICAL RESULT CALLED TO, READ BACK BY AND VERIFIED WITH: WAGONER,R AT 5:35AM ON 08/28/15 BY FESTERMAN,C    Chloride 107 101 - 111 mmol/L   CO2 26 22 - 32 mmol/L   Glucose, Bld 123 (H) 65 - 99 mg/dL   BUN 8 6 - 20 mg/dL   Creatinine, Ser 0.73 0.44 - 1.00 mg/dL   Calcium 8.3 (L) 8.9 - 10.3 mg/dL   GFR calc non Af Amer >60 >60 mL/min   GFR calc Af Amer >60 >60 mL/min    Comment: (NOTE) The eGFR has been calculated using the CKD EPI equation. This calculation has not been validated in all clinical situations. eGFR's persistently <60 mL/min signify possible Chronic Kidney Disease.    Anion gap 6 5 - 15  CBC  Status: Abnormal   Collection Time: 08/28/15  4:34 AM  Result Value Ref Range   WBC 5.9 4.0 - 10.5 K/uL   RBC 3.72 (L) 3.87 - 5.11 MIL/uL   Hemoglobin 11.5 (L) 12.0  - 15.0 g/dL   HCT 33.0 (L) 36.0 - 46.0 %   MCV 88.7 78.0 - 100.0 fL   MCH 30.9 26.0 - 34.0 pg   MCHC 34.8 30.0 - 36.0 g/dL   RDW 13.0 11.5 - 15.5 %   Platelets 234 150 - 400 K/uL  Glucose, capillary     Status: Abnormal   Collection Time: 08/28/15  7:28 AM  Result Value Ref Range   Glucose-Capillary 36 (LL) 65 - 99 mg/dL   Comment 1 Notify RN    Comment 2 Document in Chart   Glucose, capillary     Status: Abnormal   Collection Time: 08/28/15  8:33 AM  Result Value Ref Range   Glucose-Capillary 176 (H) 65 - 99 mg/dL   Comment 1 Notify RN    Comment 2 Document in Chart     ABGS No results for input(s): PHART, PO2ART, TCO2, HCO3 in the last 72 hours.  Invalid input(s): PCO2 CULTURES Recent Results (from the past 240 hour(s))  Urine culture     Status: None   Collection Time: 08/20/15  5:50 PM  Result Value Ref Range Status   Specimen Description URINE, CLEAN CATCH  Final   Special Requests NONE  Final   Culture NO GROWTH Performed at [facility]   Final   Report Status 08/22/2015 FINAL  Final  CSF culture     Status: None   Collection Time: 08/20/15  6:00 PM  Result Value Ref Range Status   Specimen Description CSF  Final   Special Requests NONE  Final   Gram Stain   Final    CYTOSPIN SMEAR WBC PRESENT,BOTH PMN AND MONONUCLEAR NO ORGANISMS SEEN Performed at [facility]    Culture   Final    NO GROWTH 3 DAYS Performed at [facility]    Report Status 08/24/2015 FINAL  Final  Gram stain     Status: None   Collection Time: 08/20/15  6:50 PM  Result Value Ref Range Status   Specimen Description CSF  Final   Special Requests NONE  Final   Gram Stain   Final    CYTOSPIN SMEAR NO ORGANISMS SEEN WBC PRESENT,BOTH PMN AND MONONUCLEAR Performed at [facility]   Report Status 08/20/2015 FINAL  Final  Hsv Culture And Typing     Status: None   Collection Time: 08/20/15  6:50 PM  Result Value Ref Range Status   HSV Culture/Type Comment  Final     Comment: (NOTE) Negative No Herpes simplex virus isolated. Performed At: [facility] [address] [place], [place] [id number] Lindon Romp MD [id number]    Source of Sample CSF  Final  Blood Culture (routine x 2)     Status: None   Collection Time: 08/20/15  7:15 PM  Result Value Ref Range Status   Specimen Description BLOOD RIGHT HAND  Final   Special Requests BOTTLES DRAWN AEROBIC AND ANAEROBIC [facility]  Final   Culture NO GROWTH 5 DAYS  Final   Report Status 08/25/2015 FINAL  Final  Blood Culture (routine x 2)     Status: None   Collection Time: 08/20/15  7:22 PM  Result Value Ref Range Status   Specimen Description BLOOD RIGHT HAND  Final  Special Requests BOTTLES DRAWN AEROBIC AND ANAEROBIC 6CC  Final   Culture NO GROWTH 5 DAYS  Final   Report Status 08/25/2015 FINAL  Final  MRSA PCR Screening     Status: None   Collection Time: 08/20/15  9:25 PM  Result Value Ref Range Status   MRSA by PCR NEGATIVE NEGATIVE Final    Comment:        The GeneXpert MRSA Assay (FDA approved for NASAL specimens only), is one component of a comprehensive MRSA colonization surveillance program. It is not intended to diagnose MRSA infection nor to guide or monitor treatment for MRSA infections.    Studies/Results: Dg Chest 2 View  08/27/2015  CLINICAL DATA:  Altered mental status. Crackles in the left lung base. EXAM: CHEST  2 VIEW COMPARISON:  Single-view of the chest 08/22/2015 and 08/20/2015. PA and lateral chest 05/21/2015. FINDINGS: Streaky airspace disease is seen in the lingula and left lung base. Minimal right basilar atelectasis is noted. Heart size is normal. No pneumothorax or pleural effusion. IMPRESSION: Streaky lingular and left basilar airspace disease could be due to atelectasis or pneumonia. Electronically Signed   By: Inge Rise M.D.   On: 08/27/2015 10:15   Ct Head Wo Contrast  08/27/2015  CLINICAL DATA:  Altered mental status for 1 day. History of  seizures. EXAM: CT HEAD WITHOUT CONTRAST TECHNIQUE: Contiguous axial images were obtained from the base of the skull through the vertex without intravenous contrast. COMPARISON:  Head CT May 31, 2014 and brain MRI August 23, 2015 FINDINGS: The ventricles are normal in size and configuration. There is slight frontal atrophy bilaterally. There is no intracranial mass, hemorrhage, extra-axial fluid collection, or midline shift. The gray-white compartments appear normal. No acute infarct evident. The bony calvarium appears intact. Mastoids on the left are clear. The mastoids on the right are largely clear with several opacified inferior right mastoid air cells. No intraorbital lesions are evident in the visualized orbital regions. IMPRESSION: Mild frontal atrophy bilaterally. Ventricles normal in size and configuration. No intracranial mass, hemorrhage, or evidence of focal infarct. Mild inferior mastoid disease on the right. Mastoids elsewhere clear. Electronically Signed   By: Lowella Grip III M.D.   On: 08/27/2015 10:43    Medications: I have reviewed the patient's current medications.  Assesment:   Principal Problem:   Seizure ([place]) Active Problems:   Diabetes (HCC)   COPD (chronic obstructive pulmonary disease) (HCC)   Protein-calorie malnutrition, severe (HCC)   HCAP (healthcare-associated pneumonia) hypokalemia   Plan:  Medications reviewed Will continue K= replacement Continue current treatment Seizure precaution [facility].      Decklin Weddington 08/28/2015, 8:36 AM

## 2015-08-29 ENCOUNTER — Observation Stay (HOSPITAL_BASED_OUTPATIENT_CLINIC_OR_DEPARTMENT_OTHER)
Admit: 2015-08-29 | Discharge: 2015-08-29 | Disposition: A | Discharge status: Home / Self Care | Payer: Medicaid Other | Attending: Neurology | Admitting: Neurology

## 2015-08-29 DIAGNOSIS — R4182 Altered mental status, unspecified: Secondary | ICD-10-CM | POA: Diagnosis present

## 2015-08-29 DIAGNOSIS — T426X5A Adverse effect of other antiepileptic and sedative-hypnotic drugs, initial encounter: Secondary | ICD-10-CM | POA: Diagnosis not present

## 2015-08-29 DIAGNOSIS — R402362 Coma scale, best motor response, obeys commands, at arrival to emergency department: Secondary | ICD-10-CM | POA: Diagnosis not present

## 2015-08-29 DIAGNOSIS — N390 Urinary tract infection, site not specified: Secondary | ICD-10-CM | POA: Diagnosis not present

## 2015-08-29 DIAGNOSIS — Y95 Nosocomial condition: Secondary | ICD-10-CM | POA: Diagnosis not present

## 2015-08-29 DIAGNOSIS — J440 Chronic obstructive pulmonary disease with acute lower respiratory infection: Secondary | ICD-10-CM | POA: Diagnosis not present

## 2015-08-29 DIAGNOSIS — Z7982 Long term (current) use of aspirin: Secondary | ICD-10-CM | POA: Diagnosis not present

## 2015-08-29 DIAGNOSIS — E43 Unspecified severe protein-calorie malnutrition: Secondary | ICD-10-CM | POA: Diagnosis not present

## 2015-08-29 DIAGNOSIS — E162 Hypoglycemia, unspecified: Secondary | ICD-10-CM

## 2015-08-29 DIAGNOSIS — E876 Hypokalemia: Secondary | ICD-10-CM | POA: Diagnosis not present

## 2015-08-29 DIAGNOSIS — J438 Other emphysema: Secondary | ICD-10-CM

## 2015-08-29 DIAGNOSIS — E119 Type 2 diabetes mellitus without complications: Secondary | ICD-10-CM

## 2015-08-29 DIAGNOSIS — Z6821 Body mass index (BMI) 21.0-21.9, adult: Secondary | ICD-10-CM | POA: Diagnosis not present

## 2015-08-29 DIAGNOSIS — J189 Pneumonia, unspecified organism: Secondary | ICD-10-CM | POA: Diagnosis not present

## 2015-08-29 DIAGNOSIS — Z794 Long term (current) use of insulin: Secondary | ICD-10-CM | POA: Diagnosis not present

## 2015-08-29 DIAGNOSIS — N179 Acute kidney failure, unspecified: Secondary | ICD-10-CM | POA: Diagnosis not present

## 2015-08-29 DIAGNOSIS — R402242 Coma scale, best verbal response, confused conversation, at arrival to emergency department: Secondary | ICD-10-CM | POA: Diagnosis not present

## 2015-08-29 DIAGNOSIS — Z87891 Personal history of nicotine dependence: Secondary | ICD-10-CM | POA: Diagnosis not present

## 2015-08-29 DIAGNOSIS — E1165 Type 2 diabetes mellitus with hyperglycemia: Secondary | ICD-10-CM | POA: Diagnosis not present

## 2015-08-29 DIAGNOSIS — L271 Localized skin eruption due to drugs and medicaments taken internally: Secondary | ICD-10-CM | POA: Diagnosis not present

## 2015-08-29 DIAGNOSIS — R569 Unspecified convulsions: Secondary | ICD-10-CM

## 2015-08-29 DIAGNOSIS — G40911 Epilepsy, unspecified, intractable, with status epilepticus: Secondary | ICD-10-CM | POA: Diagnosis present

## 2015-08-29 DIAGNOSIS — E0781 Sick-euthyroid syndrome: Secondary | ICD-10-CM | POA: Diagnosis not present

## 2015-08-29 DIAGNOSIS — R402132 Coma scale, eyes open, to sound, at arrival to emergency department: Secondary | ICD-10-CM | POA: Diagnosis not present

## 2015-08-29 LAB — GLUCOSE, CAPILLARY
GLUCOSE-CAPILLARY: 243 mg/dL — AB (ref 65–99)
GLUCOSE-CAPILLARY: 345 mg/dL — AB (ref 65–99)
Glucose-Capillary: 137 mg/dL — ABNORMAL HIGH (ref 65–99)
Glucose-Capillary: 138 mg/dL — ABNORMAL HIGH (ref 65–99)
Glucose-Capillary: 224 mg/dL — ABNORMAL HIGH (ref 65–99)
Glucose-Capillary: 284 mg/dL — ABNORMAL HIGH (ref 65–99)
Glucose-Capillary: 411 mg/dL — ABNORMAL HIGH (ref 65–99)
Glucose-Capillary: 70 mg/dL (ref 65–99)

## 2015-08-29 LAB — HEPATIC FUNCTION PANEL
ALK PHOS: 28 U/L — AB (ref 38–126)
ALT: 41 U/L (ref 14–54)
AST: 32 U/L (ref 15–41)
Albumin: 3 g/dL — ABNORMAL LOW (ref 3.5–5.0)
BILIRUBIN DIRECT: 0.2 mg/dL (ref 0.1–0.5)
Indirect Bilirubin: 1.5 mg/dL — ABNORMAL HIGH (ref 0.3–0.9)
Total Bilirubin: 1.7 mg/dL — ABNORMAL HIGH (ref 0.3–1.2)
Total Protein: 6 g/dL — ABNORMAL LOW (ref 6.5–8.1)

## 2015-08-29 LAB — BASIC METABOLIC PANEL
Anion gap: 16 — ABNORMAL HIGH (ref 5–15)
BUN: 12 mg/dL (ref 6–20)
CO2: 17 mmol/L — ABNORMAL LOW (ref 22–32)
Calcium: 8.8 mg/dL — ABNORMAL LOW (ref 8.9–10.3)
Chloride: 101 mmol/L (ref 101–111)
Creatinine, Ser: 1.27 mg/dL — ABNORMAL HIGH (ref 0.44–1.00)
GFR calc Af Amer: 52 mL/min — ABNORMAL LOW (ref >60)
GFR calc non Af Amer: 45 mL/min — ABNORMAL LOW (ref >60)
Glucose, Bld: 414 mg/dL — ABNORMAL HIGH (ref 65–99)
Potassium: 4.6 mmol/L (ref 3.5–5.1)
Sodium: 134 mmol/L — ABNORMAL LOW (ref 135–145)

## 2015-08-29 LAB — MAGNESIUM: Magnesium: 1.7 mg/dL (ref 1.7–2.4)

## 2015-08-29 MED ORDER — CETYLPYRIDINIUM CHLORIDE 0.05 % MT LIQD
7.0000 mL | Freq: Two times a day (BID) | OROMUCOSAL | Status: DC
  Administered 2015-08-29 – 2015-09-05 (×13): 7 mL via OROMUCOSAL

## 2015-08-29 MED ORDER — DEXTROSE 5 % IV SOLN
INTRAVENOUS | Status: DC
  Administered 2015-08-29: 01:00:00 via INTRAVENOUS

## 2015-08-29 MED ORDER — SODIUM CHLORIDE 0.9 % IV SOLN
INTRAVENOUS | Status: DC
  Administered 2015-08-29 – 2015-08-31 (×4): via INTRAVENOUS

## 2015-08-29 MED ORDER — POTASSIUM CHLORIDE 10 MEQ/100ML IV SOLN
10.0000 meq | INTRAVENOUS | Status: DC
  Administered 2015-08-29: 10 meq via INTRAVENOUS
  Filled 2015-08-29: qty 100

## 2015-08-29 MED ORDER — VALPROATE SODIUM 500 MG/5ML IV SOLN
1000.0000 mg | INTRAVENOUS | Status: AC
  Administered 2015-08-29: 1000 mg via INTRAVENOUS
  Filled 2015-08-29: qty 10

## 2015-08-29 MED ORDER — INSULIN ASPART 100 UNIT/ML ~~LOC~~ SOLN
0.0000 [IU] | SUBCUTANEOUS | Status: DC
  Administered 2015-08-29: 5 [IU] via SUBCUTANEOUS
  Administered 2015-08-29: 3 [IU] via SUBCUTANEOUS
  Administered 2015-08-29: 9 [IU] via SUBCUTANEOUS
  Administered 2015-08-29: 1 [IU] via SUBCUTANEOUS
  Administered 2015-08-30: 2 [IU] via SUBCUTANEOUS
  Administered 2015-08-30 (×2): 3 [IU] via SUBCUTANEOUS
  Administered 2015-08-30 (×2): 1 [IU] via SUBCUTANEOUS
  Administered 2015-08-30: 2 [IU] via SUBCUTANEOUS
  Administered 2015-08-31: 7 [IU] via SUBCUTANEOUS
  Administered 2015-08-31 (×2): 3 [IU] via SUBCUTANEOUS
  Administered 2015-08-31: 9 [IU] via SUBCUTANEOUS
  Administered 2015-08-31 (×2): 7 [IU] via SUBCUTANEOUS
  Administered 2015-08-31: 3 [IU] via SUBCUTANEOUS
  Administered 2015-09-01: 9 [IU] via SUBCUTANEOUS
  Administered 2015-09-01 (×2): 2 [IU] via SUBCUTANEOUS
  Administered 2015-09-01: 5 [IU] via SUBCUTANEOUS

## 2015-08-29 MED ORDER — CHLORHEXIDINE GLUCONATE 0.12 % MT SOLN
15.0000 mL | Freq: Two times a day (BID) | OROMUCOSAL | Status: DC
  Administered 2015-08-29 – 2015-09-05 (×14): 15 mL via OROMUCOSAL
  Filled 2015-08-29 (×13): qty 15

## 2015-08-29 MED ORDER — VALPROATE SODIUM 500 MG/5ML IV SOLN
500.0000 mg | Freq: Three times a day (TID) | INTRAVENOUS | Status: DC
  Administered 2015-08-29 – 2015-08-30 (×2): 500 mg via INTRAVENOUS
  Filled 2015-08-29 (×4): qty 5

## 2015-08-29 MED ORDER — VALPROATE SODIUM 500 MG/5ML IV SOLN
500.0000 mg | Freq: Two times a day (BID) | INTRAVENOUS | Status: DC
  Filled 2015-08-29 (×2): qty 5

## 2015-08-29 NOTE — Progress Notes (Signed)
Pt's CBG read 70, pt NPO and no fluids running, NP Clance Boll (on call) paged and notified, ordered 5% dextrose at 33ml same commenced at 0100, pt still non verbal but opens her eyes at random, barely following any command, will however continue to monitor. [place]-As[place], Teana Lindahl Efe

## 2015-08-29 NOTE — Consult Note (Signed)
[facility] Consult   Reason for Consult:  Pseudoseizure Referring Physician:  Dr. Erlinda Hong Patient Identification: Veronica Moses MRN:  [id number] Principal Diagnosis: Seizure [facility]) Diagnosis:   Patient Active Problem List   Diagnosis Date Noted  . HCAP (healthcare-associated pneumonia) [J18.9] 08/27/2015  . Status epilepticus ([place]) [G40.901] 08/20/2015  . Seizure ([place]) [R56.9] 08/20/2015  . DKA (diabetic ketoacidoses) ([place]) [E13.10] 08/20/2015  . Fall [W19.XXXA] 05/07/2014  . History of sprained wrist [Z87.39] 05/07/2014  . Protein-calorie malnutrition, severe ([place]) [E43] 10/25/2013  . Numbness, limb [R20.0] 10/24/2013  . Chest pain [R07.9] 05/30/2013  . Diabetes ([place]) [E11.9] 02/08/2013  . COPD (chronic obstructive pulmonary disease) ([place]) [J44.9] 02/08/2013  . GERD (gastroesophageal reflux disease) [K21.9] 02/08/2013  . Abdominal pain, chronic, epigastric [R10.13, G89.29] 02/08/2013    Total Time spent with patient: 45 minutes  Subjective:   Veronica Moses is a 60 y.o. female patient admitted with Seizures.  HPI:  Veronica Moses is an 60 y.o. Female, seen, chart reviewed and case discussed with patient husband, older daughter and patient family friend who is at bedside. Psychiatric consultation for the face-to-face requested to evaluate for possible pseudoseizure versus stress-induced seizures. Patient is not able to contribute to the history is today because currently under sedation secondary to medication which is required due to multiple recurrent seizures as per the staff. Patient family reported she has no history of seizures until 2 weeks ago even though she was suffered with a seizures at age 87 years old. Patient has multiple psychosocial stresses mostly about financial difficulties, family related stresses especially her grand daughter who has been suffering with the polysubstance abuse and more frequent in and out of the acute psychiatric hospitalization.  Patient grandson was incarcerated for the substance related problems and currently to be in the community. Patient also worried about her inability to pay for medical bills when she go to the doctor's office to take care of for diabetes and insulin therapies as per the patient husband. Patient husband stated they tried to give reassurance saying that they will take care of the financial part of the medical care but patient cannot be comforted with it.  Medical history: Patient with hx of COPD, DM, recent admission for status epilepticus, seen by neurology, and had workup to include LP, MRI, CT, Carotid US, ECHO without any identifiable cause of her seizure. She was subsequently given Keppra, and did not have any further seizure activities. She was subsequently discharged to home, but brought back today being lethargic and found to have urinary incontinence. Evaluation in the ER included K of 3.0, normal renal Fx, no leukocytosis, normal NH3 level, and negative troponins. Her CXR showed an infiltrate, and her head CT showed nothing acute. She was felt to have another seizure, and possibly had aspiration. She was given IV Van/Zosyn, and hospitalist was asked to admit her for further Tx.   Past Psychiatric History: Patient has no history of acute psychiatric hospitalization or outpatient psychiatric care.  Risk to Self: Is patient at risk for suicide?: No Risk to Others:   Prior Inpatient Therapy:   Prior Outpatient Therapy:    Past Medical History:  Past Medical History  Diagnosis Date  . COPD (chronic obstructive pulmonary disease) ([place])   . Diabetes mellitus   . UTI (lower urinary tract infection)   . Seizures [facility])     Past Surgical History  Procedure Laterality Date  . Abdominal hysterectomy    . Nephrectomy    .  Cholecystectomy    . Breast surgery    . Colonoscopy with esophagogastroduodenoscopy (egd) N/A 03/04/2013    Procedure: COLONOSCOPY WITH ESOPHAGOGASTRODUODENOSCOPY (EGD);   Surgeon: Rogene Houston, MD;  Location: [facility];  Service: Endoscopy;  Laterality: N/A;  1200-moved to [place] notified pt   Family History:  Family History  Problem Relation Age of Onset  . Colon cancer Neg Hx   . CAD      family history  . Diabetes Father   . Diabetes Brother    Family Psychiatric  History: Patient has no family history of acute psychiatric illness but she had grandchildren suffering with the polysubstance abuse which is stressing her. Social History:  History  Alcohol Use No     History  Drug Use No    Social History   Social History  . Marital Status: Married    Spouse Name: N/A  . Number of Children: N/A  . Years of Education: N/A   Social History Main Topics  . Smoking status: Former Smoker -- 1.00 packs/day for 26 years    Types: Cigarettes  . Smokeless tobacco: Former Systems developer    Quit date: 05/15/2012  . Alcohol Use: No  . Drug Use: No  . Sexual Activity: Not Currently   Other Topics Concern  . None   Social History Narrative   Additional Social History:    Allergies:   Allergies  Allergen Reactions  . Demerol Other (See Comments)    hallucinations  . Dilaudid [Hydromorphone Hcl] Nausea And Vomiting    Labs:  Results for orders placed or performed during the hospital encounter of 08/27/15 (from the past 48 hour(s))  CBG monitoring, ED     Status: Abnormal   Collection Time: 08/27/15  1:48 PM  Result Value Ref Range   Glucose-Capillary 162 (H) 65 - 99 mg/dL  Glucose, capillary     Status: None   Collection Time: 08/27/15  5:36 PM  Result Value Ref Range   Glucose-Capillary 65 65 - 99 mg/dL   Comment 1 Notify RN    Comment 2 Document in Chart   Glucose, capillary     Status: Abnormal   Collection Time: 08/27/15  6:24 PM  Result Value Ref Range   Glucose-Capillary 139 (H) 65 - 99 mg/dL   Comment 1 Notify RN    Comment 2 Document in Chart   Glucose, capillary     Status: Abnormal   Collection Time: 08/27/15  8:22 PM   Result Value Ref Range   Glucose-Capillary 154 (H) 65 - 99 mg/dL   Comment 1 Notify RN    Comment 2 Document in Chart   Glucose, capillary     Status: Abnormal   Collection Time: 08/28/15 12:38 AM  Result Value Ref Range   Glucose-Capillary 360 (H) 65 - 99 mg/dL   Comment 1 Notify RN    Comment 2 Document in Chart   Glucose, capillary     Status: Abnormal   Collection Time: 08/28/15  4:15 AM  Result Value Ref Range   Glucose-Capillary 131 (H) 65 - 99 mg/dL  Basic metabolic panel     Status: Abnormal   Collection Time: 08/28/15  4:34 AM  Result Value Ref Range   Sodium 139 135 - 145 mmol/L   Potassium 2.6 (LL) 3.5 - 5.1 mmol/L    Comment: CRITICAL RESULT CALLED TO, READ BACK BY AND VERIFIED WITH: WAGONER,R AT 5:35AM ON 08/28/15 BY FESTERMAN,C    Chloride 107 101 -  111 mmol/L   CO2 26 22 - 32 mmol/L   Glucose, Bld 123 (H) 65 - 99 mg/dL   BUN 8 6 - 20 mg/dL   Creatinine, Ser 0.73 0.44 - 1.00 mg/dL   Calcium 8.3 (L) 8.9 - 10.3 mg/dL   GFR calc non Af Amer >60 >60 mL/min   GFR calc Af Amer >60 >60 mL/min    Comment: (NOTE) The eGFR has been calculated using the CKD EPI equation. This calculation has not been validated in all clinical situations. eGFR's persistently <60 mL/min signify possible Chronic Kidney Disease.    Anion gap 6 5 - 15  CBC     Status: Abnormal   Collection Time: 08/28/15  4:34 AM  Result Value Ref Range   WBC 5.9 4.0 - 10.5 K/uL   RBC 3.72 (L) 3.87 - 5.11 MIL/uL   Hemoglobin 11.5 (L) 12.0 - 15.0 g/dL   HCT 33.0 (L) 36.0 - 46.0 %   MCV 88.7 78.0 - 100.0 fL   MCH 30.9 26.0 - 34.0 pg   MCHC 34.8 30.0 - 36.0 g/dL   RDW 13.0 11.5 - 15.5 %   Platelets 234 150 - 400 K/uL  Glucose, capillary     Status: Abnormal   Collection Time: 08/28/15  7:28 AM  Result Value Ref Range   Glucose-Capillary 36 (LL) 65 - 99 mg/dL   Comment 1 Notify RN    Comment 2 Document in Chart   Glucose, capillary     Status: Abnormal   Collection Time: 08/28/15  8:33 AM  Result  Value Ref Range   Glucose-Capillary 176 (H) 65 - 99 mg/dL   Comment 1 Notify RN    Comment 2 Document in Chart   Blood gas, arterial     Status: Abnormal   Collection Time: 08/28/15  8:40 AM  Result Value Ref Range   FIO2 21.00    Delivery systems ROOM AIR    pH, Arterial 7.486 (H) 7.350 - 7.450   pCO2 arterial 35.7 35.0 - 45.0 mmHg   pO2, Arterial 69.8 (L) 80.0 - 100.0 mmHg   Bicarbonate 27.6 (H) 20.0 - 24.0 mEq/L   Acid-Base Excess 3.4 (H) 0.0 - 2.0 mmol/L   O2 Saturation 93.6 %   Patient temperature 37.0    Collection site RIGHT BRACHIAL    Drawn by [phone]    Sample type ARTERIAL DRAW   Glucose, capillary     Status: Abnormal   Collection Time: 08/28/15 11:25 AM  Result Value Ref Range   Glucose-Capillary 285 (H) 65 - 99 mg/dL   Comment 1 Notify RN    Comment 2 Document in Chart   Glucose, capillary     Status: Abnormal   Collection Time: 08/28/15  4:44 PM  Result Value Ref Range   Glucose-Capillary 339 (H) 65 - 99 mg/dL   Comment 1 Notify RN    Comment 2 Document in Chart   Glucose, capillary     Status: Abnormal   Collection Time: 08/28/15  5:58 PM  Result Value Ref Range   Glucose-Capillary 342 (H) 65 - 99 mg/dL  Glucose, capillary     Status: Abnormal   Collection Time: 08/28/15  7:04 PM  Result Value Ref Range   Glucose-Capillary 231 (H) 65 - 99 mg/dL   Comment 1 Notify RN   Glucose, capillary     Status: None   Collection Time: 08/29/15 12:03 AM  Result Value Ref Range   Glucose-Capillary 70 65 - 99 mg/dL  Comment 1 Notify RN    Comment 2 Document in Chart   Glucose, capillary     Status: Abnormal   Collection Time: 08/29/15  3:48 AM  Result Value Ref Range   Glucose-Capillary 243 (H) 65 - 99 mg/dL   Comment 1 Notify RN    Comment 2 Document in Chart   Glucose, capillary     Status: Abnormal   Collection Time: 08/29/15  8:07 AM  Result Value Ref Range   Glucose-Capillary 345 (H) 65 - 99 mg/dL   Comment 1 Notify RN    Comment 2 Document in Chart    Basic metabolic panel     Status: Abnormal   Collection Time: 08/29/15  9:34 AM  Result Value Ref Range   Sodium 134 (L) 135 - 145 mmol/L   Potassium 4.6 3.5 - 5.1 mmol/L   Chloride 101 101 - 111 mmol/L   CO2 17 (L) 22 - 32 mmol/L   Glucose, Bld 414 (H) 65 - 99 mg/dL   BUN 12 6 - 20 mg/dL   Creatinine, Ser 1.27 (H) 0.44 - 1.00 mg/dL   Calcium 8.8 (L) 8.9 - 10.3 mg/dL   GFR calc non Af Amer 45 (L) >60 mL/min   GFR calc Af Amer 52 (L) >60 mL/min    Comment: (NOTE) The eGFR has been calculated using the CKD EPI equation. This calculation has not been validated in all clinical situations. eGFR's persistently <60 mL/min signify possible Chronic Kidney Disease.    Anion gap 16 (H) 5 - 15  Magnesium     Status: None   Collection Time: 08/29/15  9:34 AM  Result Value Ref Range   Magnesium 1.7 1.7 - 2.4 mg/dL  Glucose, capillary     Status: Abnormal   Collection Time: 08/29/15 12:07 PM  Result Value Ref Range   Glucose-Capillary 411 (H) 65 - 99 mg/dL   Comment 1 Notify RN    Comment 2 Document in Chart   Glucose, capillary     Status: Abnormal   Collection Time: 08/29/15 12:42 PM  Result Value Ref Range   Glucose-Capillary 284 (H) 65 - 99 mg/dL    Current Facility-Administered Medications  Medication Dose Route Frequency Provider Last Rate Last Dose  . 0.9 %  sodium chloride infusion   Intravenous Continuous Florencia Reasons, MD 75 mL/hr at 08/29/15 1053    . ALPRAZolam Duanne Moron) tablet 1 mg  1 mg Oral TID PRN Orvan Falconer, MD      . antiseptic oral rinse (CPC / CETYLPYRIDINIUM CHLORIDE 0.05%) solution 7 mL  7 mL Mouth Rinse q12n4p Florencia Reasons, MD   7 mL at 08/29/15 1200  . aspirin EC tablet 81 mg  81 mg Oral Daily Orvan Falconer, MD   Stopped at 08/29/15 1200  . chlorhexidine (PERIDEX) 0.12 % solution 15 mL  15 mL Mouth Rinse BID Florencia Reasons, MD   15 mL at 08/29/15 1030  . cycloSPORINE (RESTASIS) 0.05 % ophthalmic emulsion 1 drop  1 drop Both Eyes BID Orvan Falconer, MD   1 drop at 08/29/15 1214  . heparin  injection 5,000 Units  5,000 Units Subcutaneous Q8H Orvan Falconer, MD   5,000 Units at 08/29/15 1343  . insulin aspart (novoLOG) injection 0-9 Units  0-9 Units Subcutaneous Q4H Florencia Reasons, MD   5 Units at 08/29/15 1246  . levETIRAcetam (KEPPRA) 500 mg in sodium chloride 0.9 % 100 mL IVPB  500 mg Intravenous Q12H Gardiner Barefoot, NP   500 mg  at 08/29/15 1018  . LORazepam (ATIVAN) injection 1-2 mg  1-2 mg Intravenous Q2H PRN Asencion Noble, MD   2 mg at 08/29/15 1234  . sodium chloride flush (NS) 0.9 % injection 3 mL  3 mL Intravenous Q12H Orvan Falconer, MD   3 mL at 08/28/15 1050  . valproate (DEPACON) 1,000 mg in dextrose 5 % 50 mL IVPB  1,000 mg Intravenous STAT Marliss Coots, PA-C   1,000 mg at 08/29/15 1256  . valproate (DEPACON) 500 mg in dextrose 5 % 50 mL IVPB  500 mg Intravenous Q8H Marliss Coots, PA-C        Musculoskeletal: Strength & Muscle Tone: decreased Gait & Station: unable to stand Patient leans: N/A  Psychiatric Specialty Exam: Physical Exam as per history and physical   Review of Systems  Unable to perform ROS    Blood pressure 111/93, pulse 90, temperature 98.8 F (37.1 C), temperature source Oral, resp. rate 18, height 5' 1" (1.549 m), weight 57.335 kg (126 lb 6.4 oz), SpO2 93 %.Body mass index is 23.9 kg/(m^2).  General Appearance: Casual, it appeared lying in her bed, resting under sedation quietly without distress.   Eye Contact:  None  Speech:  NA  Volume:  NA  Mood:  NA  Affect:  NA  Thought Process:  NA  Orientation:  NA  Thought Content:  NA  Suicidal Thoughts:  NA  Homicidal Thoughts:  NA  Memory:  NA  Judgement:  NA  Insight:  NA  Psychomotor Activity:  NA  Concentration:  NA  Recall:  NA  Fund of Knowledge:  NA  Language:  NA  Akathisia:  NA  Handed:  Right  AIMS (if indicated):     Assets:  Others:   deferred  ADL's:  Impaired  Cognition:  Deferred   Sleep:        Treatment Plan Summary: Patient presented with the 2 weeks he history of recurrent  and repeated seizures which were recorded in the EEG and still has questions about stress induces seizures as she has no seizures over several decades.  Patient is currently under the care of neurology and hospitalist, patient benefit from antiepileptic medication and continuous monitoring as documented  Will ask hospitalist to contact psychiatric consultation and patient was able to communicate effectively for the psychiatric evaluation. Patient family were at bedside agree that patient has multiple psychosocial stressors as listed in history and physical.    Disposition:  No evidence of imminent risk to self or others at present.   Supportive therapy provided about ongoing stressors.  Ambrose Finland, MD 08/29/2015 1:46 PM

## 2015-08-29 NOTE — Evaluation (Signed)
Clinical/Bedside Swallow Evaluation Patient Details  Name: Veronica Moses MRN: [id number] Date of Birth: 01-08-1956  Today's Date: 08/29/2015 Time: SLP Start Time (ACUTE ONLY): [id number] SLP Stop Time (ACUTE ONLY): 0907 SLP Time Calculation (min) (ACUTE ONLY): 25 min  Past Medical History:  Past Medical History  Diagnosis Date  . COPD (chronic obstructive pulmonary disease) ([place])   . Diabetes mellitus   . UTI (lower urinary tract infection)   . Seizures ([place])    Past Surgical History:  Past Surgical History  Procedure Laterality Date  . Abdominal hysterectomy    . Nephrectomy    . Cholecystectomy    . Breast surgery    . Colonoscopy with esophagogastroduodenoscopy (egd) N/A 03/04/2013    Procedure: COLONOSCOPY WITH ESOPHAGOGASTRODUODENOSCOPY (EGD);  Surgeon: Rogene Houston, MD;  Location[facility];  Service: Endoscopy;  Laterality: N/A;  1200-moved to [place] notified pt   HPI:  60 y.o. female with hx of COPD, DM, status epilepticus, UTI admitted with lethargy and found to have urinary incontinence. Her CXR showed streaky lingular and left basilar airspace disease could be due to atelectasis or pneumonia. CT showed nothing acute. Per chart she was felt to have another seizure, and possibly had aspiration. No prior ST notes.   Assessment / Plan / Recommendation Clinical Impression  Pt alert, periods of uncomprehensible speech; moaning decreased awareness intermittently Initiated swallow with thin, puree and solid. Oral delays and decreased manipulation with solid. No indications of aspiration. Cognitive deficits and COPD increase risks. Lung infiltrates likely aspiration of emesis during seizure. Recommend Dys 1, thin liquids, straws allowed, only eat when adequate awareness, crush meds and full supervision. ST will follow.     Aspiration Risk  Moderate aspiration risk    Diet Recommendation Dysphagia 1 (Puree);Thin liquid   Liquid Administration via: Cup;Straw Medication  Administration: Crushed with puree Supervision: Patient able to self feed;Full supervision/cueing for compensatory strategies;Staff to assist with self feeding Compensations: Slow rate;Small sips/bites;Minimize environmental distractions Postural Changes: Seated upright at 90 degrees    Other  Recommendations Oral Care Recommendations: Oral care BID   Follow up Recommendations   (TBD)    Frequency and Duration min 2x/week  2 weeks       Prognosis Prognosis for Safe Diet Advancement: Good Barriers to Reach Goals: Cognitive deficits      Swallow Study   General HPI: 60 y.o. female with hx of COPD, DM, status epilepticus, UTI admitted with lethargy and found to have urinary incontinence. Her CXR showed streaky lingular and left basilar airspace disease could be due to atelectasis or pneumonia. CT showed nothing acute. Per chart she was felt to have another seizure, and possibly had aspiration. No prior ST notes. Type of Study: Bedside Swallow Evaluation Previous Swallow Assessment:  (none) Diet Prior to this Study: NPO Temperature Spikes Noted: Yes Respiratory Status: Room air History of Recent Intubation: No Behavior/Cognition: Alert;Confused;Requires cueing Oral Cavity Assessment: Dry (lips) Oral Care Completed by SLP: Yes Oral Cavity - Dentition: Adequate natural dentition Vision: Functional for self-feeding Self-Feeding Abilities: Able to feed self;Needs set up;Needs assist Patient Positioning: Upright in bed Baseline Vocal Quality: Normal Volitional Cough: Cognitively unable to elicit Volitional Swallow: Unable to elicit    Oral/Motor/Sensory Function Overall Oral Motor/Sensory Function:  (unable to follow commands)   Ice Chips Ice chips: Not tested   Thin Liquid Thin Liquid: Within functional limits Presentation: Cup;Straw    Nectar Thick Nectar Thick Liquid: Not tested   Honey Thick Honey Thick Liquid:  Not tested   Puree Puree: Within functional limits    Solid   GO   Solid: Impaired Oral Phase Functional Implications: Prolonged oral transit (min labial residue)    Functional Assessment Tool Used: skilled clinical judgement Functional Limitations: Swallowing Swallow Current Status [id number]): At least 20 percent but less than 40 percent impaired, limited or restricted Swallow Goal Status [phone]): At least 1 percent but less than 20 percent impaired, limited or restricted   Houston Siren 08/29/2015,9:19 AM  Orbie Pyo Colvin Caroli.Ed [employer] [phone]

## 2015-08-29 NOTE — Progress Notes (Signed)
PROGRESS NOTE  Veronica Moses [id number] DOB: June 11, 1955 DOA: 08/27/2015 PCP: Alonza Bogus, MD  HPI/Recap of past 24 hours:  Family reported multiple "seizure episodes" with right facial gaze and vocalization, each episodes lasts for a few seconds, patient is nonverbal inbetween episodes, she maintains her airway, she passed swallow eval in the interval of "seisure episodes" Husband and sister in room  Assessment/Plan: Principal Problem:   Seizure ([place]) Active Problems:   Diabetes ([place])   COPD (chronic obstructive pulmonary disease) ([place])   Protein-calorie malnutrition, severe ([place])   HCAP (healthcare-associated pneumonia)  Seizures: initial concern for pseudoseizure due to family reported recent stressor at home, however EEG + focal seizure, meds adjusted, neurology and psychiatry input appreciated. Keep npo on ivf for now  Insulin dependent dm2, recent a1c 5.3, with hypoglycemic episodes , lowest 36, requiring d5 infusion briefly, current blood sugar elevated, on ssi   aki: cr 1.27, baseline 0.7, ua no infection, adequate urine output, continue hydration, repeat cr in am  Aspiration pna? No cough, no fever, no hypoxia, will d/c iv abx for now and observe  Hypokalemia: replace k   Code Status: full  Family Communication: patient and family  Disposition Plan: remain in patient   Consultants:  neurology  Procedures:  EEG  Antibiotics:  vanc /zosyn from admission to 6/14   Objective: BP 111/93 mmHg  Pulse 90  Temp(Src) 98.8 F (37.1 C) (Oral)  Resp 18  Ht 5\' 1"  (1.549 m)  Wt 57.335 kg (126 lb 6.4 oz)  BMI 23.90 kg/m2  SpO2 93%  Intake/Output Summary (Last 24 hours) at 08/29/15 1101 Last data filed at 08/28/15 1800  Gross per 24 hour  Intake    200 ml  Output   2275 ml  Net  -2075 ml   Filed Weights   08/27/15 1600 08/28/15 0408 08/28/15 1933  Weight: 56.609 kg (124 lb 12.8 oz) 55.1 kg (121 lb 7.6 oz) 57.335 kg (126 lb 6.4 oz)     Exam:   General:  Nonverbal, intermittent brief vocalization, left facial twitching, head turning to right with left gaze, last for a few seconds.  Cardiovascular: RRR  Respiratory: CTABL  Abdomen: Soft/ND/NT, positive BS  Musculoskeletal: No Edema  Neuro: Nonverbal, intermittent brief vocalization, left facial twitching, head turning to right with left gaze, last for a few seconds. Not following commands.    Data Reviewed: Basic Metabolic Panel:  Recent Labs Lab 08/27/15 0924 08/28/15 0434 08/29/15 0934  NA 139 139 134*  K 3.0* 2.6* 4.6  CL 101 107 101  CO2 29 26 17*  GLUCOSE 193* 123* 414*  BUN 10 8 12   CREATININE 0.79 0.73 1.27*  CALCIUM 8.3* 8.3* 8.8*  MG 1.5*  --  1.7   Liver Function Tests:  Recent Labs Lab 08/27/15 0924  AST 84*  ALT 54  ALKPHOS 25*  BILITOT 0.7  PROT 6.0*  ALBUMIN 3.2*   No results for input(s): LIPASE, AMYLASE in the last 168 hours.  Recent Labs Lab 08/27/15 0924  AMMONIA 14   CBC:  Recent Labs Lab 08/27/15 0924 08/28/15 0434  WBC 5.4 5.9  HGB 12.0 11.5*  HCT 35.2* 33.0*  MCV 89.8 88.7  PLT 224 234   Cardiac Enzymes:    Recent Labs Lab 08/27/15 0924  TROPONINI 0.03   BNP (last 3 results) No results for input(s): BNP in the last 8760 hours.  ProBNP (last 3 results) No results for input(s): PROBNP in the last 8760 hours.  CBG:  Recent Labs Lab 08/28/15 1758 08/28/15 1904 08/29/15 0003 08/29/15 0348 08/29/15 0807  GLUCAP 342* 231* 70 243* 345*    Recent Results (from the past 240 hour(s))  Urine culture     Status: None   Collection Time: 08/20/15  5:50 PM  Result Value Ref Range Status   Specimen Description URINE, CLEAN CATCH  Final   Special Requests NONE  Final   Culture NO GROWTH Performed at [facility]   Final   Report Status 08/22/2015 FINAL  Final  CSF culture     Status: None   Collection Time: 08/20/15  6:00 PM  Result Value Ref Range Status   Specimen Description  CSF  Final   Special Requests NONE  Final   Gram Stain   Final    CYTOSPIN SMEAR WBC PRESENT,BOTH PMN AND MONONUCLEAR NO ORGANISMS SEEN Performed at [facility]    Culture   Final    NO GROWTH 3 DAYS Performed at [facility]    Report Status 08/24/2015 FINAL  Final  Gram stain     Status: None   Collection Time: 08/20/15  6:50 PM  Result Value Ref Range Status   Specimen Description CSF  Final   Special Requests NONE  Final   Gram Stain   Final    CYTOSPIN SMEAR NO ORGANISMS SEEN WBC PRESENT,BOTH PMN AND MONONUCLEAR Performed at [facility]   Report Status 08/20/2015 FINAL  Final  Hsv Culture And Typing     Status: None   Collection Time: 08/20/15  6:50 PM  Result Value Ref Range Status   HSV Culture/Type Comment  Final    Comment: (NOTE) Negative No Herpes simplex virus isolated. Performed At: [facility] [place], [place] [id number] Lindon Romp MD [id number]    Source of Sample CSF  Final  Blood Culture (routine x 2)     Status: None   Collection Time: 08/20/15  7:15 PM  Result Value Ref Range Status   Specimen Description BLOOD RIGHT HAND  Final   Special Requests BOTTLES DRAWN AEROBIC AND ANAEROBIC 6CC  Final   Culture NO GROWTH 5 DAYS  Final   Report Status 08/25/2015 FINAL  Final  Blood Culture (routine x 2)     Status: None   Collection Time: 08/20/15  7:22 PM  Result Value Ref Range Status   Specimen Description BLOOD RIGHT HAND  Final   Special Requests BOTTLES DRAWN AEROBIC AND ANAEROBIC 6CC  Final   Culture NO GROWTH 5 DAYS  Final   Report Status 08/25/2015 FINAL  Final  MRSA PCR Screening     Status: None   Collection Time: 08/20/15  9:25 PM  Result Value Ref Range Status   MRSA by PCR NEGATIVE NEGATIVE Final    Comment:        The GeneXpert MRSA Assay (FDA approved for NASAL specimens only), is one component of a comprehensive MRSA colonization surveillance program. It is not intended to  diagnose MRSA infection nor to guide or monitor treatment for MRSA infections.   Blood culture (routine x 2)     Status: None (Preliminary result)   Collection Time: 08/27/15  9:24 AM  Result Value Ref Range Status   Specimen Description BLOOD LEFT ANTECUBITAL  Final   Special Requests BOTTLES DRAWN AEROBIC ONLY 6CC  Final   Culture NO GROWTH 2 DAYS  Final   Report Status PENDING  Incomplete  Blood culture (routine x 2)  Status: None (Preliminary result)   Collection Time: 08/27/15 11:28 AM  Result Value Ref Range Status   Specimen Description BLOOD LEFT ANTECUBITAL  Final   Special Requests BOTTLES DRAWN AEROBIC AND ANAEROBIC [place]  Final   Culture NO GROWTH 2 DAYS  Final   Report Status PENDING  Incomplete     Studies: No results found.  Scheduled Meds: . antiseptic oral rinse  7 mL Mouth Rinse q12n4p  . aspirin EC  81 mg Oral Daily  . chlorhexidine  15 mL Mouth Rinse BID  . cycloSPORINE  1 drop Both Eyes BID  . heparin  5,000 Units Subcutaneous Q8H  . insulin aspart  0-9 Units Subcutaneous Q4H  . levETIRAcetam  500 mg Intravenous Q12H  . sodium chloride flush  3 mL Intravenous Q12H    Continuous Infusions: . sodium chloride 75 mL/hr at 08/29/15 1053     Time spent: 53mins  Rabecca Birge MD, PhD  [facility] Pager [phone]. If 7PM-7AM, please contact night-coverage at [email or web address], password [facility] 08/29/2015, 11:01 AM

## 2015-08-29 NOTE — Progress Notes (Signed)
During assessment this am family at bedside reported to this nurse that they think that pt has been having seizure activity. This nurse had observed pt yelling out and staring but responding to stimuli.  Notified Md about family concerns and CBG. Md did arrive to see pt new order was put in for EEG.  Prn was not administered until after test. Administer prn Ativan and Depakote IV per verbal order pt has decreased yelling and staring out. Family stated that she has only had 2 episodes 30 min apart. No noted distress. Will continue to monitor.

## 2015-08-29 NOTE — Procedures (Signed)
ELECTROENCEPHALOGRAM REPORT  Date of Study: 08/29/2015  Patient's Name: Veronica Moses MRN: [id number] Date of Birth: Oct 30, 1955  Referring Provider: Etta Quill, PA-C  Indication: 60 year old woman with altered mental status and witnessed seizure activity.  Medications: 0.9 % sodium chloride infusion ALPRAZolam (XANAX) tablet 1 mg antiseptic oral rinse (CPC / CETYLPYRIDINIUM CHLORIDE 0.05%) solution 7 mL aspirin EC tablet 81 mg chlorhexidine (PERIDEX) 0.12 % solution 15 mL cycloSPORINE (RESTASIS) 0.05 % ophthalmic emulsion 1 drop heparin injection 5,000 Units insulin aspart (novoLOG) injection 0-9 Units levETIRAcetam (KEPPRA) 500 mg in sodium chloride 0.9 % 100 mL IVPB LORazepam (ATIVAN) injection 1-2 mg sodium chloride flush (NS) 0.9 % injection 3 mL  Technical Summary: This is a multichannel digital EEG recording, using the international 10-20 placement system with electrodes applied with paste and impedances below 5000 ohms.    Description: Interictal background shows generalized mixed 2-3 Hz delta and 5-6 Hz theta slowing with left frontal sharp waves, with no discernible posterior dominant rhythm.    During the recording, the patient exhibited three electrographic seizures, with evolution of fast sharp wave frequencies leading from the left hemisphere that quickly generalizes, evolving into sharp and slow wave complexes.  They last between 70 and 75 seconds (except for the first seizure, which lasted for 35 seconds but was in progress at beginning of study).   Clinically, patient reported to have head and gaze deviation to the right.   Stage II sleep is not seen, with normal and symmetric sleep patterns.  Hyperventilation and photic stimulation were not performed, and produced no abnormalities.  ECG revealed normal cardiac rate and rhythm.  Impression: This is an abnormal EEG due to: 1.  Electrographic seizures, with lead in from the left hemisphere. 2.  Left frontal  interictal sharp waves, which suggests increased risk of seizures from this rhythm 3.  Generalized background slowing, which is indicative of encephalopathy  Findings called into the neurohospitalist on call. Jakwan Sally R. Tomi Likens, DO

## 2015-08-29 NOTE — Progress Notes (Signed)
Pt reported CBG-411 to Md instructed to administer per sliding scale and recheck.

## 2015-08-29 NOTE — Progress Notes (Signed)
Pt CBG-345. Asymptomatic. Family has concerns. Md notified, stated she will speak with family. No noted distress. Speech therapist at bedside to do evaluation.  Family at bedside. Will continue to monitor.

## 2015-08-29 NOTE — Progress Notes (Signed)
Pt observed to be having frequent focal seizures, had about 5-6 seizures within 5 minutes interval, ativan 1mg  given at 2149, pt reassured, will continue to monitor, family at bedside. Ob[place]-Asidi, Aarin Sparkman Efe

## 2015-08-29 NOTE — Progress Notes (Signed)
Results for ASSATA, SHOAFF (MRN [id number]) as of 08/29/2015 12:45  Ref. Range 08/29/2015 00:03 08/29/2015 03:48 08/29/2015 08:07 08/29/2015 12:07 08/29/2015 12:42  Glucose-Capillary Latest Ref Range: 65-99 mg/dL 70 243 (H) 345 (H) 411 (H) 284 (H)  Noted that blood sugars elevated. Recommend that a portion of the home dose of Lantus be started back. Start Lantus 10-15 units daily (patient takes Lantus 22 units daily at home) and continue Novolog correction scale as ordered. Harvel Ricks RN BSN CDE

## 2015-08-29 NOTE — Progress Notes (Signed)
Pt's sister called that pt seems to be having a seizure with right facial gaze, pt seems quiet in bed with eyes open on assessment but still non verbal, attempts intermittently to say a few incomprehensive words, speech consulted to review pt, family and pt reassured, will continue to monitor. Ob[place]-Asidi, Marzella Miracle Efe

## 2015-08-29 NOTE — Consult Note (Signed)
NEURO HOSPITALIST CONSULT NOTE   Requestig physician: Dr. Erlinda Hong  Reason for Consult: AMS  HPI:                                                                                                                                          Veronica Moses is an 60 y.o. female with hx of COPD, DM, recent admission for status epilepticus, seen by neurology, and had workup to include LP, MRI, CT, Carotid US, ECHO without any identifiable cause of her seizure. She was subsequently given Keppra, and did not exhibit any further seizure activity. She was subsequently discharged to home, but brought back due to being lethargic and having urinary incontinence. She was felt to have another seizure, and possibly had aspiration. She was given IV Van/Zosyn. This morning per nurses note "Pt's sister called that pt seems to be having a seizure with right facial gaze, pt seems quiet in bed with eyes open on assessment but still non-verbal, attempts intermittently to say a few incomprehensive words".    Per previous notes she was in ED on 6/5 for seizure "She had some clenching of the jaw at home and he tried to feed her - she would chew and drink but would not talk to him and became more obtunded eventually having a short lasted seizure that the husband describes as shaking all over - eyes rolled back - he tried to get her out of a chair and she fell forward strking her head on a side table AFTER the first seizure - she then had another seizure and then EMS saw another - confirmed tonic clonic activity - the pt had been complaining of dysuria earlier as well.--she was intubated and EEG while in hospital showed no epileptiform activity. She was discharged home on Keppra"  Per family her seizures now are unlike her previous seizure. Now they are frequent with moaning.   While in the room Pt had 2 seizure episodes. While on the EEG--initiated with vocalization then eyes turning to left and head turning to the  right. Followed by eyes moving to the right and right arm raised. As she stopped vocalization she responded to blink to threat and sternal rub.  Per family she has been under tremendous stress due to family member being a drug addict.   EEG was read and does show epileptiform activity  Past Medical History  Diagnosis Date  . COPD (chronic obstructive pulmonary disease) ([place])   . Diabetes mellitus   . UTI (lower urinary tract infection)   . Seizures [facility])     Past Surgical History  Procedure Laterality Date  . Abdominal hysterectomy    . Nephrectomy    . Cholecystectomy    . Breast surgery    . Colonoscopy with esophagogastroduodenoscopy (  egd) N/A 03/04/2013    Procedure: COLONOSCOPY WITH ESOPHAGOGASTRODUODENOSCOPY (EGD);  Surgeon: Rogene Houston, MD;  Location: [facility];  Service: Endoscopy;  Laterality: N/A;  1200-moved to [place] notified pt    Family History  Problem Relation Age of Onset  . Colon cancer Neg Hx   . CAD      family history  . Diabetes Father   . Diabetes Brother      Social History:  reports that she has quit smoking. Her smoking use included Cigarettes. She has a 26 pack-year smoking history. She quit smokeless tobacco use about 3 years ago. She reports that she does not drink alcohol or use illicit drugs.  Allergies  Allergen Reactions  . Demerol Other (See Comments)    hallucinations  . Dilaudid [Hydromorphone Hcl] Nausea And Vomiting    MEDICATIONS:                                                                                                                     Prior to Admission:  Prescriptions prior to admission  Medication Sig Dispense Refill Last Dose  . acetaminophen (TYLENOL) 500 MG tablet Take 1,000 mg by mouth every 6 (six) hours as needed for mild pain or moderate pain.   08/26/2015 at Unknown time  . ALPRAZolam (XANAX) 1 MG tablet Take 1 mg by mouth 3 (three) times daily as needed. For anxiety   08/26/2015 at Unknown time  .  aspirin EC 81 MG tablet Take 81 mg by mouth daily.   08/26/2015 at Unknown time  . cyclobenzaprine (FLEXERIL) 10 MG tablet Take 1 tablet (10 mg total) by mouth 3 (three) times daily as needed for muscle spasms. 20 tablet 0 08/26/2015 at Unknown time  . HYDROcodone-acetaminophen (NORCO/VICODIN) 5-325 MG tablet Take 1 tablet by mouth every 6 (six) hours as needed for moderate pain. 30 tablet 0 08/26/2015 at Unknown time  . ibuprofen (ADVIL) 200 MG tablet Take 1 tablet (200 mg total) by mouth every 6 (six) hours as needed. 30 tablet 0 08/26/2015 at Unknown time  . insulin glargine (LANTUS) 100 UNIT/ML injection Inject 22 Units into the skin every morning.    08/26/2015 at Unknown time  . levETIRAcetam (KEPPRA) 500 MG tablet Take 1 tablet (500 mg total) by mouth 2 (two) times daily. 60 tablet 12 08/27/2015 at Unknown time  . NOVOLOG FLEXPEN 100 UNIT/ML FlexPen Please use the hospital sliding scale provided for you 15 mL 12 08/26/2015 at Unknown time  . RESTASIS 0.05 % ophthalmic emulsion Apply 1 drop to eye 2 (two) times daily.  3 08/26/2015 at Unknown time   Scheduled: . antiseptic oral rinse  7 mL Mouth Rinse q12n4p  . aspirin EC  81 mg Oral Daily  . chlorhexidine  15 mL Mouth Rinse BID  . cycloSPORINE  1 drop Both Eyes BID  . heparin  5,000 Units Subcutaneous Q8H  . levETIRAcetam  500 mg Intravenous Q12H  . piperacillin-tazobactam (ZOSYN)  IV  3.375 g Intravenous  Q8H  . sodium chloride flush  3 mL Intravenous Q12H  . vancomycin  500 mg Intravenous Q12H     ROS:                                                                                                                                       History obtained from unobtainable from patient due to mental status   Blood pressure 111/93, pulse 90, temperature 98.8 F (37.1 C), temperature source Oral, resp. rate 18, height 5\' 1"  (1.549 m), weight 57.335 kg (126 lb 6.4 oz), SpO2 93 %.   Neurologic Examination:                                                                                                       HEENT-  Normocephalic, no lesions, without obvious abnormality.  Normal external eye and conjunctiva.   Cardiovascular- S1, S2 normal, pulses palpable throughout   Lungs- chest clear, no wheezing, rales, normal symmetric air entry Abdomen- normal findings: bowel sounds normal Extremities- no edema Lymph-no adenopathy palpable Musculoskeletal-no joint tenderness, deformity or swelling Skin-warm and dry, no hyperpigmentation, vitiligo, or suspicious lesions  Neurological Examination Mental Status: Alert, non verbal, withdraws from pain follows no commands, staring forward.  Cranial Nerves: II: blinks to threat bilaterally, pupils equal, round, reactive to light and accommodation III,IV, VI: ptosis not present, will at time track my movements in room with full EOMI V,VII: face symmetric, facial light touch sensation normal bilaterally VIII: hearing normal bilaterally IX,X: uvula rises symmetrically XI: bilateral shoulder shrug XII: midline tongue extension Motor: Moving all extremities antigravity Sensory: Pinprick and light touch intact throughout, bilaterally Deep Tendon Reflexes: 2+ and symmetric throughout Plantars: Right: downgoing   Left: downgoing Cerebellar: Unable to assess Gait: not tested   Lab Results: Basic Metabolic Panel:  Recent Labs Lab 08/27/15 0924 08/28/15 0434 08/29/15 0934  NA 139 139 134*  K 3.0* 2.6* 4.6  CL 101 107 101  CO2 29 26 17*  GLUCOSE 193* 123* 414*  BUN 10 8 12   CREATININE 0.79 0.73 1.27*  CALCIUM 8.3* 8.3* 8.8*  MG 1.5*  --  1.7    Liver Function Tests:  Recent Labs Lab 08/27/15 0924  AST 84*  ALT 54  ALKPHOS 25*  BILITOT 0.7  PROT 6.0*  ALBUMIN 3.2*   No results for input(s): LIPASE, AMYLASE in the last 168 hours.  Recent Labs Lab 08/27/15 0924  AMMONIA 14    CBC:  Recent Labs Lab  08/27/15 0924 08/28/15 0434  WBC 5.4 5.9  HGB 12.0 11.5*  HCT 35.2*  33.0*  MCV 89.8 88.7  PLT 224 234    Cardiac Enzymes:  Recent Labs Lab 08/27/15 0924  TROPONINI 0.03    Lipid Panel: No results for input(s): CHOL, TRIG, HDL, CHOLHDL, VLDL, LDLCALC in the last 168 hours.  CBG:  Recent Labs Lab 08/28/15 1758 08/28/15 1904 08/29/15 0003 08/29/15 0348 08/29/15 0807  GLUCAP 342* 231* 70 243* 345*    Microbiology: Results for orders placed or performed during the hospital encounter of 08/27/15  Blood culture (routine x 2)     Status: None (Preliminary result)   Collection Time: 08/27/15  9:24 AM  Result Value Ref Range Status   Specimen Description BLOOD LEFT ANTECUBITAL  Final   Special Requests BOTTLES DRAWN AEROBIC ONLY 6CC  Final   Culture NO GROWTH 1 DAY  Final   Report Status PENDING  Incomplete  Blood culture (routine x 2)     Status: None (Preliminary result)   Collection Time: 08/27/15 11:28 AM  Result Value Ref Range Status   Specimen Description BLOOD LEFT ANTECUBITAL  Final   Special Requests BOTTLES DRAWN AEROBIC AND ANAEROBIC 6CC EACH  Final   Culture NO GROWTH 1 DAY  Final   Report Status PENDING  Incomplete    Coagulation Studies: No results for input(s): LABPROT, INR in the last 72 hours.  Imaging: No results found.  History and examination documented by: Etta Quill PA-C, [facility], [phone]  Assessment/Plan: Impression:  20. 60 year old female with multiple seizures, confirmed as epileptic on EEG. EEG findings most consistent with focal onset seizures with secondary generalization; interictal discharges are present on the left, suggestive of an epileptogenic focus within the left cerebral hemisphere. Potential underlying etiologies include severe hyperglycemia, hypoglycemic episodes triggering seizures and/or resulting in cortical damage/new epileptogenic focus. Not secondary to withdrawal from benzos or EtOH per family. Focal lesion such as tumor or old hemorrhage was ruled out with recent MRI.    2. Renal insufficiency.   Recommendations: 1. Continue Keppra at 500 mg BID.  2. Load valproic acid 1000 mg IV x 1, then continue at scheduled dose of 500 mg IV TID.  3. Obtain valproic acid level in the AM.  4. Monitor and correct electrolytes, as indicated by lab values.  5. Glycemic control.  6. IV Ativan PRN.   Electronically signed: Dr. Kerney Elbe 08/29/2015, 10:36 AM

## 2015-08-29 NOTE — Progress Notes (Signed)
EEG Completed; Results Pending  

## 2015-08-29 NOTE — Care Management Note (Signed)
Case Management Note  Patient Details  Name: OWEDA DANOFF MRN: [id number] Date of Birth: 1955/05/08  Subjective/Objective:     Pt in with seizures. She is from home with her daughter and was set up with [facility] after discharge from [facility] on Sat.  Financial counseling was assisting her with Medicaid application at [facility]. CM informed Jasmin in Financial Counseling of medicaid potential and that process per family has been started. Per patients estranged husband the patient was receiving her insulin from Dr Luan Pulling office and the husband was assisting her with her PO medications.               Action/Plan: Awaiting medical work up. CM following for d/c needs.   Expected Discharge Date:  08/31/15               Expected Discharge Plan:     In-House Referral:     Discharge planning Services     Post Acute Care Choice:    Choice offered to:     DME Arranged:    DME Agency:     HH Arranged:    HH Agency:     Status of Service:  In process, will continue to follow  Medicare Important Message Given:    Date Medicare IM Given:    Medicare IM give by:    Date Additional Medicare IM Given:    Additional Medicare Important Message give by:     If discussed at [place] of Stay Meetings, dates discussed:    Additional Comments:  Pollie Friar, RN 08/29/2015, 11:34 AM

## 2015-08-30 DIAGNOSIS — IMO0001 Reserved for inherently not codable concepts without codable children: Secondary | ICD-10-CM | POA: Insufficient documentation

## 2015-08-30 DIAGNOSIS — Z658 Other specified problems related to psychosocial circumstances: Secondary | ICD-10-CM

## 2015-08-30 DIAGNOSIS — E119 Type 2 diabetes mellitus without complications: Secondary | ICD-10-CM

## 2015-08-30 DIAGNOSIS — R569 Unspecified convulsions: Secondary | ICD-10-CM

## 2015-08-30 DIAGNOSIS — G9340 Encephalopathy, unspecified: Secondary | ICD-10-CM | POA: Insufficient documentation

## 2015-08-30 DIAGNOSIS — Z794 Long term (current) use of insulin: Secondary | ICD-10-CM

## 2015-08-30 LAB — BASIC METABOLIC PANEL
Anion gap: 9 (ref 5–15)
BUN: 9 mg/dL (ref 6–20)
CHLORIDE: 107 mmol/L (ref 101–111)
CO2: 21 mmol/L — ABNORMAL LOW (ref 22–32)
Calcium: 8.2 mg/dL — ABNORMAL LOW (ref 8.9–10.3)
Creatinine, Ser: 0.84 mg/dL (ref 0.44–1.00)
GFR calc Af Amer: >60 mL/min (ref >60)
GFR calc non Af Amer: >60 mL/min (ref >60)
GLUCOSE: 167 mg/dL — AB (ref 65–99)
POTASSIUM: 3.4 mmol/L — AB (ref 3.5–5.1)
Sodium: 137 mmol/L (ref 135–145)

## 2015-08-30 LAB — GLUCOSE, CAPILLARY
GLUCOSE-CAPILLARY: 196 mg/dL — AB (ref 65–99)
GLUCOSE-CAPILLARY: 133 mg/dL — AB (ref 65–99)
GLUCOSE-CAPILLARY: 219 mg/dL — AB (ref 65–99)
Glucose-Capillary: 191 mg/dL — ABNORMAL HIGH (ref 65–99)
Glucose-Capillary: 226 mg/dL — ABNORMAL HIGH (ref 65–99)
Glucose-Capillary: 230 mg/dL — ABNORMAL HIGH (ref 65–99)

## 2015-08-30 LAB — VALPROIC ACID LEVEL: Valproic Acid Lvl: 63 ug/mL (ref 50.0–100.0)

## 2015-08-30 LAB — MRSA PCR SCREENING: MRSA BY PCR: NEGATIVE

## 2015-08-30 MED ORDER — VALPROATE SODIUM 500 MG/5ML IV SOLN
750.0000 mg | Freq: Three times a day (TID) | INTRAVENOUS | Status: DC
  Administered 2015-08-30 – 2015-09-03 (×12): 750 mg via INTRAVENOUS
  Filled 2015-08-30 (×18): qty 7.5

## 2015-08-30 MED ORDER — SODIUM CHLORIDE 0.9 % IV SOLN
1000.0000 mg | Freq: Every day | INTRAVENOUS | Status: AC
  Administered 2015-08-30 – 2015-09-03 (×5): 1000 mg via INTRAVENOUS
  Filled 2015-08-30 (×5): qty 8

## 2015-08-30 MED ORDER — VALPROATE SODIUM 500 MG/5ML IV SOLN
500.0000 mg | INTRAVENOUS | Status: AC
  Administered 2015-08-30: 500 mg via INTRAVENOUS
  Filled 2015-08-30: qty 5

## 2015-08-30 NOTE — Progress Notes (Signed)
Subjective: Continued seizures.   Objective: Current vital signs: BP 141/55 mmHg  Pulse 87  Temp(Src) 99.2 F (37.3 C) (Axillary)  Resp 18  Ht 5\' 1"  (1.549 m)  Wt 57.335 kg (126 lb 6.4 oz)  BMI 23.90 kg/m2  SpO2 96% Vital signs in last 24 hours: Temp:  [98.8 F (37.1 C)-100.3 F (37.9 C)] 99.2 F (37.3 C) (06/15 0955) Pulse Rate:  [79-92] 87 (06/15 0955) Resp:  [18-22] 18 (06/15 0955) BP: (93-141)/(48-72) 141/55 mmHg (06/15 0955) SpO2:  [90 %-96 %] 96 % (06/15 0955)  Intake/Output from previous day: 06/14 0701 - 06/15 0700 In: -  Out: 1350 [Urine:1350] Intake/Output this shift:   Nutritional status: Diet NPO time specified Except for: 7/15, Sips with Meds  General Exam: [place]/AT. No cyanosis or diaphoresis noted. No rash.   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   Following her seizure, she is postictal with eyes open, awake and non-responsive to verbal questions or commands.  Does not move limbs to command, but does exhibit normal spontaneous movements shortly after her seizure.   Lab Results: Results for orders placed or performed during the hospital encounter of 08/27/15 (from the past 48 hour(s))  Glucose, capillary     Status: Abnormal   Collection Time: 08/28/15 11:25 AM  Result Value Ref Range   Glucose-Capillary 285 (H) 65 - 99 mg/dL   Comment 1 Notify RN    Comment 2 Document in Chart   Glucose, capillary     Status: Abnormal   Collection Time: 08/28/15  4:44 PM  Result Value Ref Range   Glucose-Capillary 339 (H) 65 - 99 mg/dL   Comment 1 Notify RN    Comment 2 Document in Chart   Glucose, capillary     Status: Abnormal   Collection Time: 08/28/15  5:58 PM  Result Value Ref Range   Glucose-Capillary 342 (H) 65 - 99 mg/dL  Glucose, capillary     Status: Abnormal   Collection Time: 08/28/15  7:04 PM  Result Value Ref Range   Glucose-Capillary 231 (H) 65 - 99 mg/dL   Comment 1 Notify RN   Glucose, capillary     Status: None   Collection Time: 08/29/15 12:03 AM  Result Value Ref Range   Glucose-Capillary 70 65 - 99 mg/dL   Comment 1 Notify RN    Comment 2 Document in Chart   Glucose, capillary     Status: Abnormal   Collection Time: 08/29/15  3:48 AM  Result Value Ref Range   Glucose-Capillary 243 (H) 65 - 99 mg/dL   Comment 1 Notify RN    Comment 2 Document in Chart   Glucose, capillary     Status: Abnormal   Collection Time: 08/29/15  8:07 AM  Result Value Ref Range   Glucose-Capillary 345 (H) 65 - 99 mg/dL   Comment 1 Notify RN    Comment 2 Document in Chart   Basic metabolic panel     Status: Abnormal   Collection Time: 08/29/15  9:34 AM  Result Value Ref Range   Sodium 134 (L) 135 - 145 mmol/L   Potassium 4.6 3.5 - 5.1 mmol/L   Chloride 101 101 - 111 mmol/L   CO2 17 (L) 22 - 32 mmol/L   Glucose, Bld 414 (H) 65 - 99 mg/dL   BUN 12 6 - 20 mg/dL   Creatinine, Ser 08/31/15 (H) 0.44 - 1.00 mg/dL  Calcium 8.8 (L) 8.9 - 10.3 mg/dL   GFR calc non Af Amer 45 (L) >60 mL/min   GFR calc Af Amer 52 (L) >60 mL/min    Comment: (NOTE) The eGFR has been calculated using the CKD EPI equation. This calculation has not been validated in all clinical situations. eGFR's persistently <60 mL/min signify possible Chronic Kidney Disease.    Anion gap 16 (H) 5 - 15  Magnesium     Status: None   Collection Time: 08/29/15  9:34 AM  Result Value Ref Range   Magnesium 1.7 1.7 - 2.4 mg/dL  Glucose, capillary     Status: Abnormal   Collection Time: 08/29/15 12:07 PM  Result Value Ref Range   Glucose-Capillary 411 (H) 65 - 99 mg/dL   Comment 1 Notify RN    Comment 2 Document in Chart   Glucose, capillary     Status: Abnormal   Collection Time: 08/29/15 12:42 PM  Result Value Ref Range   Glucose-Capillary 284 (H) 65 - 99 mg/dL  Glucose, capillary     Status: Abnormal   Collection Time: 08/29/15  4:18 PM  Result  Value Ref Range   Glucose-Capillary 224 (H) 65 - 99 mg/dL   Comment 1 Notify RN    Comment 2 Document in Chart   Hepatic function panel     Status: Abnormal   Collection Time: 08/29/15  4:31 PM  Result Value Ref Range   Total Protein 6.0 (L) 6.5 - 8.1 g/dL   Albumin 3.0 (L) 3.5 - 5.0 g/dL   AST 32 15 - 41 U/L   ALT 41 14 - 54 U/L   Alkaline Phosphatase 28 (L) 38 - 126 U/L   Total Bilirubin 1.7 (H) 0.3 - 1.2 mg/dL   Bilirubin, Direct 0.2 0.1 - 0.5 mg/dL   Indirect Bilirubin 1.5 (H) 0.3 - 0.9 mg/dL  Glucose, capillary     Status: Abnormal   Collection Time: 08/29/15  8:11 PM  Result Value Ref Range   Glucose-Capillary 137 (H) 65 - 99 mg/dL   Comment 1 Notify RN    Comment 2 Document in Chart   MRSA PCR Screening     Status: None   Collection Time: 08/29/15  9:28 PM  Result Value Ref Range   MRSA by PCR NEGATIVE NEGATIVE    Comment:        The GeneXpert MRSA Assay (FDA approved for NASAL specimens only), is one component of a comprehensive MRSA colonization surveillance program. It is not intended to diagnose MRSA infection nor to guide or monitor treatment for MRSA infections.   Glucose, capillary     Status: Abnormal   Collection Time: 08/29/15 11:48 PM  Result Value Ref Range   Glucose-Capillary 138 (H) 65 - 99 mg/dL   Comment 1 Notify RN    Comment 2 Document in Chart   Glucose, capillary     Status: Abnormal   Collection Time: 08/30/15  3:57 AM  Result Value Ref Range   Glucose-Capillary 226 (H) 65 - 99 mg/dL   Comment 1 Notify RN    Comment 2 Document in Chart   Basic metabolic panel     Status: Abnormal   Collection Time: 08/30/15  6:11 AM  Result Value Ref Range   Sodium 137 135 - 145 mmol/L   Potassium 3.4 (L) 3.5 - 5.1 mmol/L    Comment: DELTA CHECK NOTED   Chloride 107 101 - 111 mmol/L   CO2 21 (L) 22 - 32 mmol/L  Glucose, Bld 167 (H) 65 - 99 mg/dL   BUN 9 6 - 20 mg/dL   Creatinine, Ser 0.84 0.44 - 1.00 mg/dL   Calcium 8.2 (L) 8.9 - 10.3 mg/dL    GFR calc non Af Amer >60 >60 mL/min   GFR calc Af Amer >60 >60 mL/min    Comment: (NOTE) The eGFR has been calculated using the CKD EPI equation. This calculation has not been validated in all clinical situations. eGFR's persistently <60 mL/min signify possible Chronic Kidney Disease.    Anion gap 9 5 - 15  Valproic acid level     Status: None   Collection Time: 08/30/15  6:11 AM  Result Value Ref Range   Valproic Acid Lvl 63 50.0 - 100.0 ug/mL  Glucose, capillary     Status: Abnormal   Collection Time: 08/30/15  8:32 AM  Result Value Ref Range   Glucose-Capillary 196 (H) 65 - 99 mg/dL    Recent Results (from the past 240 hour(s))  Urine culture     Status: None   Collection Time: 08/20/15  5:50 PM  Result Value Ref Range Status   Specimen Description URINE, CLEAN CATCH  Final   Special Requests NONE  Final   Culture NO GROWTH Performed at [facility]   Final   Report Status 08/22/2015 FINAL  Final  CSF culture     Status: None   Collection Time: 08/20/15  6:00 PM  Result Value Ref Range Status   Specimen Description CSF  Final   Special Requests NONE  Final   Gram Stain   Final    CYTOSPIN SMEAR WBC PRESENT,BOTH PMN AND MONONUCLEAR NO ORGANISMS SEEN Performed at [facility]    Culture   Final    NO GROWTH 3 DAYS Performed at [facility]    Report Status 08/24/2015 FINAL  Final  Gram stain     Status: None   Collection Time: 08/20/15  6:50 PM  Result Value Ref Range Status   Specimen Description CSF  Final   Special Requests NONE  Final   Gram Stain   Final    CYTOSPIN SMEAR NO ORGANISMS SEEN WBC PRESENT,BOTH PMN AND MONONUCLEAR Performed at [facility]   Report Status 08/20/2015 FINAL  Final  Hsv Culture And Typing     Status: None   Collection Time: 08/20/15  6:50 PM  Result Value Ref Range Status   HSV Culture/Type Comment  Final    Comment: (NOTE) Negative No Herpes simplex virus isolated. Performed At: [facility] [place], [place] [id number] Lindon Romp MD [id number]    Source of Sample CSF  Final  Blood Culture (routine x 2)     Status: None   Collection Time: 08/20/15  7:15 PM  Result Value Ref Range Status   Specimen Description BLOOD RIGHT HAND  Final   Special Requests BOTTLES DRAWN AEROBIC AND ANAEROBIC [facility]  Final   Culture NO GROWTH 5 DAYS  Final   Report Status 08/25/2015 FINAL  Final  Blood Culture (routine x 2)     Status: None   Collection Time: 08/20/15  7:22 PM  Result Value Ref Range Status   Specimen Description BLOOD RIGHT HAND  Final   Special Requests BOTTLES DRAWN AEROBIC AND ANAEROBIC 6CC  Final   Culture NO GROWTH 5 DAYS  Final   Report Status 08/25/2015 FINAL  Final  MRSA PCR Screening     Status: None  Collection Time: 08/20/15  9:25 PM  Result Value Ref Range Status   MRSA by PCR NEGATIVE NEGATIVE Final    Comment:        The GeneXpert MRSA Assay (FDA approved for NASAL specimens only), is one component of a comprehensive MRSA colonization surveillance program. It is not intended to diagnose MRSA infection nor to guide or monitor treatment for MRSA infections.   Blood culture (routine x 2)     Status: None (Preliminary result)   Collection Time: 08/27/15  9:24 AM  Result Value Ref Range Status   Specimen Description BLOOD LEFT ANTECUBITAL  Final   Special Requests BOTTLES DRAWN AEROBIC ONLY 6CC  Final   Culture NO GROWTH 3 DAYS  Final   Report Status PENDING  Incomplete  Blood culture (routine x 2)     Status: None (Preliminary result)   Collection Time: 08/27/15 11:28 AM  Result Value Ref Range Status   Specimen Description BLOOD LEFT ANTECUBITAL  Final   Special Requests BOTTLES DRAWN AEROBIC AND ANAEROBIC 6CC EACH  Final   Culture NO GROWTH 3 DAYS  Final   Report Status PENDING  Incomplete  MRSA PCR Screening     Status: None   Collection Time: 08/29/15  9:28 PM  Result Value Ref Range Status   MRSA by PCR NEGATIVE  NEGATIVE Final    Comment:        The GeneXpert MRSA Assay (FDA approved for NASAL specimens only), is one component of a comprehensive MRSA colonization surveillance program. It is not intended to diagnose MRSA infection nor to guide or monitor treatment for MRSA infections.     Lipid Panel No results for input(s): CHOL, TRIG, HDL, CHOLHDL, VLDL, LDLCALC in the last 72 hours.  Studies/Results: No results found.  Medications:   Current facility-administered medications:  .  0.9 %  sodium chloride infusion, , Intravenous, Continuous, Florencia Reasons, MD, Last Rate: 75 mL/hr at 08/29/15 1053 .  ALPRAZolam (XANAX) tablet 1 mg, 1 mg, Oral, TID PRN, Orvan Falconer, MD .  antiseptic oral rinse (CPC / CETYLPYRIDINIUM CHLORIDE 0.05%) solution 7 mL, 7 mL, Mouth Rinse, q12n4p, Florencia Reasons, MD, 7 mL at 08/29/15 1600 .  aspirin EC tablet 81 mg, 81 mg, Oral, Daily, Orvan Falconer, MD, Stopped at 08/29/15 1200 .  chlorhexidine (PERIDEX) 0.12 % solution 15 mL, 15 mL, Mouth Rinse, BID, Florencia Reasons, MD, 15 mL at 08/29/15 2144 .  cycloSPORINE (RESTASIS) 0.05 % ophthalmic emulsion 1 drop, 1 drop, Both Eyes, BID, Orvan Falconer, MD, 1 drop at 08/29/15 2146 .  heparin injection 5,000 Units, 5,000 Units, Subcutaneous, Q8H, Orvan Falconer, MD, 5,000 Units at 08/30/15 [phone] .  insulin aspart (novoLOG) injection 0-9 Units, 0-9 Units, Subcutaneous, Q4H, Florencia Reasons, MD, 2 Units at 08/30/15 [phone] .  levETIRAcetam (KEPPRA) 500 mg in sodium chloride 0.9 % 100 mL IVPB, 500 mg, Intravenous, Q12H, Gardiner Barefoot, NP, 500 mg at 08/30/15 0837 .  LORazepam (ATIVAN) injection 1-2 mg, 1-2 mg, Intravenous, Q2H PRN, Asencion Noble, MD, 1 mg at 08/30/15 0406 .  sodium chloride flush (NS) 0.9 % injection 3 mL, 3 mL, Intravenous, Q12H, Orvan Falconer, MD, 3 mL at 08/29/15 2150 .  valproate (DEPACON) 500 mg in dextrose 5 % 50 mL IVPB, 500 mg, Intravenous, STAT, Marliss Coots, PA-C, 500 mg at 08/30/15 8101 .  valproate (DEPACON) 750 mg in dextrose 5 % 50 mL IVPB, 750 mg,  Intravenous, Q8H, Marliss Coots, PA-C   Assessment/Plan: Impression: 1. New  onset of epileptic seizures and AMS. Have responded partially to Keppra and Depakote. Valproic acid level is 63 today, in low therapeutic range. 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  2.   Per patient's husband, she has no prior history of autoimmune disease.  3. COPD.  4. DM.   Recommendations:  1. Discussed risks/benefits of empiric pulsed-dose steroid treatment for possible autoimmune encephalopathy with patient's husband. The patient expressed consent to proceed with empiric pulsed dose steroids: IV Solumedrol 1000 mg qd x 5 days.  2. Monitor glucose levels closely while on IV steroids.  3. CBC and Chem7 qd while on IV steroids.   4. If possible, obtain a CSF IgG and albumin level if the CSF from her recent LP is still available in the lab.  5. Serum anti-NMDA and anti-VGKC antibody titers.  6. Repeat serum magnesium level.  7. Supplemental load of valproic acid 500 mg IV has not resulted in cessation of her seizures this AM.  8. Have increased her scheduled valproic acid dose by 50%. 9. PRN Ativan.  10. Continue Keppra at 500 mg BID.  11. Repeat valproic acid level tomorrow morning.    LOS: 1 day   '@Electronically'$  signed: Dr. Kerney Elbe 08/30/2015  10:04 AM

## 2015-08-30 NOTE — Progress Notes (Signed)
Pt husband stated that pt has been having seizures every 5 min even receiving medication. Pt then falls back to sleep. VS stable. Seizure precautions in place.  Md paged to notify and recommend stepdown unit. Neurology is in her room at this time and agrees. Awaiting new orders. Husband at bedside. Will continue to monitor.

## 2015-08-30 NOTE — Progress Notes (Signed)
Per patients family patient experienced a seizure at 1817 lasting twenty seconds, upon entering the room pt. Is alert with no s/s of distress noted.  Will continue to monitor.

## 2015-08-30 NOTE — Progress Notes (Signed)
Pt continues to have focal seizures less frequent than yesterday. Pt has had 1 per family at 5:45am and then again at 8:33 am.  Administered Keppra per Md.  Family at bedside. No noted distress at this time resting quietly.  Will continue to monitor.

## 2015-08-30 NOTE — Progress Notes (Signed)
Patients husband states patient had a seizure at 1642 and again at 1647 each lasting between twenty and thirty seconds.  Pt. VSS with no s/s of distress noted.  Patient opens eyes but appears to be drowsy at this time.  Will continue to monitor.

## 2015-08-30 NOTE — Progress Notes (Signed)
Rapid response came to evaluate pt for placement for transfer to stepdown.

## 2015-08-30 NOTE — Progress Notes (Signed)
SLP Cancellation Note  Patient Details Name: Veronica Moses MRN: [id number] DOB: 1955-09-24    Cancelled treatment:         Decline in status. Pt now NPO and may be transferring to stepdown. Will follow along.  Houston Siren 08/30/2015, 10:44 AM Orbie Pyo Colvin Caroli.[employer] [phone]

## 2015-08-30 NOTE — Progress Notes (Addendum)
Patient placement called requesting triage evaluation. RN Florencia Reasons at bedside reports increase frequency focal seizures, MD requesting closer monitor SD bed requested. Pt in NAD, no airway compromise, VSS, pt placement working on SD bed transfer. Staff to call as needed.

## 2015-08-30 NOTE — Progress Notes (Signed)
Pt to be transferred to stepdown unit. Report called in to nurse.

## 2015-08-30 NOTE — Progress Notes (Signed)
PROGRESS NOTE  Veronica Moses [id number] DOB: 05-12-1955 DOA: 08/27/2015 PCP: Alonza Bogus, MD  HPI/Recap of past 24 hours:  Continued frequent seizure episodes with head turning to the right , left facial twicthing, vocalization, each episodes lasts for 10-20secs, patient is nonverbal inbetween episodes, she maintains her airway, she is npo Husband in room  Assessment/Plan: Principal Problem:   Seizure ([place]) Active Problems:   Diabetes ([place])   COPD (chronic obstructive pulmonary disease) ([place])   Protein-calorie malnutrition, severe ([place])   HCAP (healthcare-associated pneumonia)  Seizures:  With recent hospitalization from 6/5 to 6/10 at [facility] , she was intubation for airway protection during hospitalization at [facility]. initial concern for pseudoseizure due to family reported recent stressor at home, however EEG + focal seizure, meds adjusted, neurology and psychiatry input appreciated. Keep npo, on ivf for now Transfer to stepdown , seizure management per neuro, neuro adjusting seizure meds and started pulse dose of steroids for possible autoimmune etiology, appreciate neurology input  Insulin dependent dm2,  recent a1c 5.3, with hypoglycemic episodes , lowest 36, requiring d5 infusion briefly, current blood sugar elevated, on ssi Need to continue adjust insulin due to steroids use   aki: cr 1.27, baseline 0.7, ua no infection, adequate urine output, continue hydration, cr normalized.  Aspiration pna? No cough, no fever, no hypoxia, no leukocytosis, will d/c iv abx for now and observe  Hypokalemia: replace k, check mag   Code Status: full  Family Communication: patient and family  Disposition Plan: transfer to stepdown due to frequent seizure   Consultants:  neurology  Procedures:  EEG  Antibiotics:  vanc /zosyn from admission to 6/14   Objective: BP 93/72 mmHg  Pulse 79  Temp(Src) 99.2 F (37.3 C) (Axillary)  Resp 18  Ht 5\' 1"  (1.549 m)   Wt 57.335 kg (126 lb 6.4 oz)  BMI 23.90 kg/m2  SpO2 92%  Intake/Output Summary (Last 24 hours) at 08/30/15 0728 Last data filed at 08/30/15 0540  Gross per 24 hour  Intake      0 ml  Output   1350 ml  Net  -1350 ml   Filed Weights   08/27/15 1600 08/28/15 0408 08/28/15 1933  Weight: 56.609 kg (124 lb 12.8 oz) 55.1 kg (121 lb 7.6 oz) 57.335 kg (126 lb 6.4 oz)    Exam:   General:  Nonverbal, intermittent brief vocalization, left facial twitching, head turning to right with left gaze, last for a few seconds.  Cardiovascular: RRR  Respiratory: CTABL  Abdomen: Soft/ND/NT, positive BS  Musculoskeletal: No Edema  Neuro: Nonverbal, intermittent brief vocalization, left facial twitching, head turning to right with left gaze, last for a few seconds. Not following commands.    Data Reviewed: Basic Metabolic Panel:  Recent Labs Lab 08/27/15 0924 08/28/15 0434 08/29/15 0934 08/30/15 0611  NA 139 139 134* 137  K 3.0* 2.6* 4.6 3.4*  CL 101 107 101 107  CO2 29 26 17* 21*  GLUCOSE 193* 123* 414* 167*  BUN 10 8 12 9   CREATININE 0.79 0.73 1.27* 0.84  CALCIUM 8.3* 8.3* 8.8* 8.2*  MG 1.5*  --  1.7  --    Liver Function Tests:  Recent Labs Lab 08/27/15 0924 08/29/15 1631  AST 84* 32  ALT 54 41  ALKPHOS 25* 28*  BILITOT 0.7 1.7*  PROT 6.0* 6.0*  ALBUMIN 3.2* 3.0*   No results for input(s): LIPASE, AMYLASE in the last 168 hours.  Recent Labs Lab 08/27/15 0924  AMMONIA  14   CBC:  Recent Labs Lab 08/27/15 0924 08/28/15 0434  WBC 5.4 5.9  HGB 12.0 11.5*  HCT 35.2* 33.0*  MCV 89.8 88.7  PLT 224 234   Cardiac Enzymes:    Recent Labs Lab 08/27/15 0924  TROPONINI 0.03   BNP (last 3 results) No results for input(s): BNP in the last 8760 hours.  ProBNP (last 3 results) No results for input(s): PROBNP in the last 8760 hours.  CBG:  Recent Labs Lab 08/29/15 1242 08/29/15 1618 08/29/15 2011 08/29/15 2348 08/30/15 0357  GLUCAP 284* 224* 137* 138*  226*    Recent Results (from the past 240 hour(s))  Urine culture     Status: None   Collection Time: 08/20/15  5:50 PM  Result Value Ref Range Status   Specimen Description URINE, CLEAN CATCH  Final   Special Requests NONE  Final   Culture NO GROWTH Performed at [facility]   Final   Report Status 08/22/2015 FINAL  Final  CSF culture     Status: None   Collection Time: 08/20/15  6:00 PM  Result Value Ref Range Status   Specimen Description CSF  Final   Special Requests NONE  Final   Gram Stain   Final    CYTOSPIN SMEAR WBC PRESENT,BOTH PMN AND MONONUCLEAR NO ORGANISMS SEEN Performed at [facility]    Culture   Final    NO GROWTH 3 DAYS Performed at [facility]    Report Status 08/24/2015 FINAL  Final  Gram stain     Status: None   Collection Time: 08/20/15  6:50 PM  Result Value Ref Range Status   Specimen Description CSF  Final   Special Requests NONE  Final   Gram Stain   Final    CYTOSPIN SMEAR NO ORGANISMS SEEN WBC PRESENT,BOTH PMN AND MONONUCLEAR Performed at [facility]   Report Status 08/20/2015 FINAL  Final  Hsv Culture And Typing     Status: None   Collection Time: 08/20/15  6:50 PM  Result Value Ref Range Status   HSV Culture/Type Comment  Final    Comment: (NOTE) Negative No Herpes simplex virus isolated. Performed At: [facility] [place], [place] [id number] Lindon Romp MD [id number]    Source of Sample CSF  Final  Blood Culture (routine x 2)     Status: None   Collection Time: 08/20/15  7:15 PM  Result Value Ref Range Status   Specimen Description BLOOD RIGHT HAND  Final   Special Requests BOTTLES DRAWN AEROBIC AND ANAEROBIC 6CC  Final   Culture NO GROWTH 5 DAYS  Final   Report Status 08/25/2015 FINAL  Final  Blood Culture (routine x 2)     Status: None   Collection Time: 08/20/15  7:22 PM  Result Value Ref Range Status   Specimen Description BLOOD RIGHT HAND  Final   Special  Requests BOTTLES DRAWN AEROBIC AND ANAEROBIC 6CC  Final   Culture NO GROWTH 5 DAYS  Final   Report Status 08/25/2015 FINAL  Final  MRSA PCR Screening     Status: None   Collection Time: 08/20/15  9:25 PM  Result Value Ref Range Status   MRSA by PCR NEGATIVE NEGATIVE Final    Comment:        The GeneXpert MRSA Assay (FDA approved for NASAL specimens only), is one component of a comprehensive MRSA colonization surveillance program. It is not intended to diagnose MRSA infection  nor to guide or monitor treatment for MRSA infections.   Blood culture (routine x 2)     Status: None (Preliminary result)   Collection Time: 08/27/15  9:24 AM  Result Value Ref Range Status   Specimen Description BLOOD LEFT ANTECUBITAL  Final   Special Requests BOTTLES DRAWN AEROBIC ONLY 6CC  Final   Culture NO GROWTH 2 DAYS  Final   Report Status PENDING  Incomplete  Blood culture (routine x 2)     Status: None (Preliminary result)   Collection Time: 08/27/15 11:28 AM  Result Value Ref Range Status   Specimen Description BLOOD LEFT ANTECUBITAL  Final   Special Requests BOTTLES DRAWN AEROBIC AND ANAEROBIC 6CC EACH  Final   Culture NO GROWTH 2 DAYS  Final   Report Status PENDING  Incomplete  MRSA PCR Screening     Status: None   Collection Time: 08/29/15  9:28 PM  Result Value Ref Range Status   MRSA by PCR NEGATIVE NEGATIVE Final    Comment:        The GeneXpert MRSA Assay (FDA approved for NASAL specimens only), is one component of a comprehensive MRSA colonization surveillance program. It is not intended to diagnose MRSA infection nor to guide or monitor treatment for MRSA infections.      Studies: No results found.  Scheduled Meds: . antiseptic oral rinse  7 mL Mouth Rinse q12n4p  . aspirin EC  81 mg Oral Daily  . chlorhexidine  15 mL Mouth Rinse BID  . cycloSPORINE  1 drop Both Eyes BID  . heparin  5,000 Units Subcutaneous Q8H  . insulin aspart  0-9 Units Subcutaneous Q4H  .  levETIRAcetam  500 mg Intravenous Q12H  . sodium chloride flush  3 mL Intravenous Q12H  . valproate sodium  500 mg Intravenous Q8H    Continuous Infusions: . sodium chloride 75 mL/hr at 08/29/15 1053     Time spent: 39mins  Reva Pinkley MD, PhD  [facility] Pager [phone]. If 7PM-7AM, please contact night-coverage at [email or web address], password [facility] 08/30/2015, 7:28 AM  LOS: 1 day

## 2015-08-31 DIAGNOSIS — E876 Hypokalemia: Secondary | ICD-10-CM | POA: Insufficient documentation

## 2015-08-31 LAB — COMPREHENSIVE METABOLIC PANEL
ALT: 25 U/L (ref 14–54)
AST: 22 U/L (ref 15–41)
Albumin: 2.3 g/dL — ABNORMAL LOW (ref 3.5–5.0)
Alkaline Phosphatase: 22 U/L — ABNORMAL LOW (ref 38–126)
Anion gap: 11 (ref 5–15)
BUN: 13 mg/dL (ref 6–20)
CHLORIDE: 109 mmol/L (ref 101–111)
CO2: 19 mmol/L — ABNORMAL LOW (ref 22–32)
CREATININE: 0.9 mg/dL (ref 0.44–1.00)
Calcium: 8.6 mg/dL — ABNORMAL LOW (ref 8.9–10.3)
Glucose, Bld: 216 mg/dL — ABNORMAL HIGH (ref 65–99)
POTASSIUM: 4.2 mmol/L (ref 3.5–5.1)
Sodium: 139 mmol/L (ref 135–145)
Total Bilirubin: 0.5 mg/dL (ref 0.3–1.2)
Total Protein: 5 g/dL — ABNORMAL LOW (ref 6.5–8.1)

## 2015-08-31 LAB — PROLACTIN: PROLACTIN: 18.1 ng/mL (ref 4.8–23.3)

## 2015-08-31 LAB — GLUCOSE, CAPILLARY
GLUCOSE-CAPILLARY: 215 mg/dL — AB (ref 65–99)
GLUCOSE-CAPILLARY: 347 mg/dL — AB (ref 65–99)
Glucose-Capillary: 224 mg/dL — ABNORMAL HIGH (ref 65–99)
Glucose-Capillary: 350 mg/dL — ABNORMAL HIGH (ref 65–99)
Glucose-Capillary: 372 mg/dL — ABNORMAL HIGH (ref 65–99)

## 2015-08-31 LAB — CBC
HCT: 32.6 % — ABNORMAL LOW (ref 36.0–46.0)
Hemoglobin: 10.9 g/dL — ABNORMAL LOW (ref 12.0–15.0)
MCH: 29.1 pg (ref 26.0–34.0)
MCHC: 33.4 g/dL (ref 30.0–36.0)
MCV: 86.9 fL (ref 78.0–100.0)
PLATELETS: 229 10*3/uL (ref 150–400)
RBC: 3.75 MIL/uL — ABNORMAL LOW (ref 3.87–5.11)
RDW: 13.4 % (ref 11.5–15.5)
WBC: 5.1 10*3/uL (ref 4.0–10.5)

## 2015-08-31 LAB — TSH: TSH: 0.192 u[IU]/mL — ABNORMAL LOW (ref 0.350–4.500)

## 2015-08-31 LAB — VALPROIC ACID LEVEL: VALPROIC ACID LVL: 120 ug/mL — AB (ref 50.0–100.0)

## 2015-08-31 LAB — MAGNESIUM: MAGNESIUM: 1.9 mg/dL (ref 1.7–2.4)

## 2015-08-31 MED ORDER — INSULIN GLARGINE 100 UNIT/ML ~~LOC~~ SOLN
10.0000 [IU] | Freq: Every day | SUBCUTANEOUS | Status: DC
  Administered 2015-08-31 – 2015-09-02 (×3): 10 [IU] via SUBCUTANEOUS
  Filled 2015-08-31 (×5): qty 0.1

## 2015-08-31 NOTE — Progress Notes (Signed)
PROGRESS NOTE  Veronica Moses [id number] DOB: October 20, 1955 DOA: 08/27/2015 PCP: Alonza Bogus, MD  HPI/Recap of past 24 hours:  Last seizure episode was 9pm on 6/15 per family Today she is more alert and following commands, she passed swallow eval and tolerating diet Multiple family members in room  Assessment/Plan: Principal Problem:   Seizure ([place]) Active Problems:   Diabetes ([place])   COPD (chronic obstructive pulmonary disease) ([place])   Protein-calorie malnutrition, severe ([place])   HCAP (healthcare-associated pneumonia)   Psychosocial stressors   Acute encephalopathy   Insulin dependent diabetes mellitus ([place])  Seizures:  With recent hospitalization from 6/5 to 6/10 at [facility] , she was intubation for airway protection during hospitalization at [facility]. initial concern for pseudoseizure due to family reported recent stressor at home, however EEG + focal seizure, meds adjusted, neurology and psychiatry input appreciated. She was Kept  Npo on 6/14 and 6/15, she passed swallow eval on 6/16 ,started diet, continue seizure and aspiration precaution Transferred to stepdown on 6/15 due to frequent seizure episodes ,  neuro adjusting seizure meds and started pulse dose of steroids for possible autoimmune etiology, seizure management and work up per neuro, appreciate neurology input  Insulin dependent dm2,  recent a1c 5.3, with hypoglycemic episodes , lowest 36, requiring d5 infusion briefly, current blood sugar elevated, on ssi Need to continue adjust insulin due to steroids use, started lantus 10units qhs on 6/16   aki: cr 1.27, baseline 0.7, ua no infection, adequate urine output, continue hydration, cr normalized.  Aspiration pna? No cough, no fever, no hypoxia, no leukocytosis, will d/c iv abx for now and observe  Hypokalemia: replace k,  Mag 1.9  Suppressed tsh, check free t3/free t4   Code Status: full  Family Communication: patient and family  Disposition  Plan: remain in stepdown   Consultants:  neurology  Procedures:  EEG  Antibiotics:  vanc /zosyn from admission to 6/14   Objective: BP 125/53 mmHg  Pulse 70  Temp(Src) 97.8 F (36.6 C) (Oral)  Resp 18  Ht 5\' 3"  (1.6 m)  Wt 55.7 kg (122 lb 12.7 oz)  BMI 21.76 kg/m2  SpO2 99%  Intake/Output Summary (Last 24 hours) at 08/31/15 2013 Last data filed at 08/31/15 1814  Gross per 24 hour  Intake 2197.5 ml  Output    825 ml  Net 1372.5 ml   Filed Weights   08/28/15 0408 08/28/15 1933 08/30/15 1203  Weight: 55.1 kg (121 lb 7.6 oz) 57.335 kg (126 lb 6.4 oz) 55.7 kg (122 lb 12.7 oz)    Exam:   General:  NAD, nonverbal, but smiling and following commands  Cardiovascular: RRR  Respiratory: CTABL  Abdomen: Soft/ND/NT, positive BS  Musculoskeletal: No Edema  Neuro: nonverbal, but smiling and following commands    Data Reviewed: Basic Metabolic Panel:  Recent Labs Lab 08/27/15 0924 08/28/15 0434 08/29/15 0934 08/30/15 0611 08/31/15 0535  NA 139 139 134* 137 139  K 3.0* 2.6* 4.6 3.4* 4.2  CL 101 107 101 107 109  CO2 29 26 17* 21* 19*  GLUCOSE 193* 123* 414* 167* 216*  BUN 10 8 12 9 13   CREATININE 0.79 0.73 1.27* 0.84 0.90  CALCIUM 8.3* 8.3* 8.8* 8.2* 8.6*  MG 1.5*  --  1.7  --  1.9   Liver Function Tests:  Recent Labs Lab 08/27/15 0924 08/29/15 1631 08/31/15 0535  AST 84* 32 22  ALT 54 41 25  ALKPHOS 25* 28* 22*  BILITOT 0.7 1.7*  0.5  PROT 6.0* 6.0* 5.0*  ALBUMIN 3.2* 3.0* 2.3*   No results for input(s): LIPASE, AMYLASE in the last 168 hours.  Recent Labs Lab 08/27/15 0924  AMMONIA 14   CBC:  Recent Labs Lab 08/27/15 0924 08/28/15 0434 08/31/15 0535  WBC 5.4 5.9 5.1  HGB 12.0 11.5* 10.9*  HCT 35.2* 33.0* 32.6*  MCV 89.8 88.7 86.9  PLT 224 234 229   Cardiac Enzymes:    Recent Labs Lab 08/27/15 0924  TROPONINI 0.03   BNP (last 3 results) No results for input(s): BNP in the last 8760 hours.  ProBNP (last 3 results) No  results for input(s): PROBNP in the last 8760 hours.  CBG:  Recent Labs Lab 08/31/15 0325 08/31/15 0735 08/31/15 1219 08/31/15 1522 08/31/15 1955  GLUCAP 224* 215* 347* 350* 372*    Recent Results (from the past 240 hour(s))  Blood culture (routine x 2)     Status: None (Preliminary result)   Collection Time: 08/27/15  9:24 AM  Result Value Ref Range Status   Specimen Description BLOOD LEFT ANTECUBITAL  Final   Special Requests BOTTLES DRAWN AEROBIC ONLY 6CC  Final   Culture NO GROWTH 3 DAYS  Final   Report Status PENDING  Incomplete  Blood culture (routine x 2)     Status: None (Preliminary result)   Collection Time: 08/27/15 11:28 AM  Result Value Ref Range Status   Specimen Description BLOOD LEFT ANTECUBITAL  Final   Special Requests BOTTLES DRAWN AEROBIC AND ANAEROBIC 6CC EACH  Final   Culture NO GROWTH 3 DAYS  Final   Report Status PENDING  Incomplete  MRSA PCR Screening     Status: None   Collection Time: 08/29/15  9:28 PM  Result Value Ref Range Status   MRSA by PCR NEGATIVE NEGATIVE Final    Comment:        The GeneXpert MRSA Assay (FDA approved for NASAL specimens only), is one component of a comprehensive MRSA colonization surveillance program. It is not intended to diagnose MRSA infection nor to guide or monitor treatment for MRSA infections.      Studies: No results found.  Scheduled Meds: . antiseptic oral rinse  7 mL Mouth Rinse q12n4p  . aspirin EC  81 mg Oral Daily  . chlorhexidine  15 mL Mouth Rinse BID  . cycloSPORINE  1 drop Both Eyes BID  . heparin  5,000 Units Subcutaneous Q8H  . insulin aspart  0-9 Units Subcutaneous Q4H  . levETIRAcetam  500 mg Intravenous Q12H  . methylPREDNISolone (SOLU-MEDROL) injection  1,000 mg Intravenous Daily  . sodium chloride flush  3 mL Intravenous Q12H  . valproate sodium  750 mg Intravenous Q8H    Continuous Infusions: . sodium chloride 75 mL/hr at 08/31/15 0400     Time spent: 4mins  Jerianne Anselmo  MD, PhD  [facility] Pager [phone]. If 7PM-7AM, please contact night-coverage at [email or web address], password [facility] 08/31/2015, 8:13 PM  LOS: 2 days

## 2015-08-31 NOTE — Progress Notes (Signed)
Inpatient Diabetes Program Recommendations  AACE/ADA: New Consensus Statement on Inpatient Glycemic Control (2015)  Target Ranges:  Prepandial:   less than 140 mg/dL      Peak postprandial:   less than 180 mg/dL (1-2 hours)      Critically ill patients:  140 - 180 mg/dL  Results for Veronica Moses, Veronica Moses (MRN [id number]) as of 08/31/2015 09:34  Ref. Range 08/30/2015 08:32 08/30/2015 11:35 08/30/2015 15:40 08/30/2015 19:51 08/30/2015 23:38 08/31/2015 03:25 08/31/2015 07:35  Glucose-Capillary Latest Ref Range: 65-99 mg/dL 196 (H) 133 (H) 191 (H) 219 (H) 230 (H) 224 (H) 215 (H)    Review of Glycemic Control   Current orders for Inpatient glycemic control: Novolog 0-9 units Q4H  Inpatient Diabetes Program Recommendations: Insulin - Basal: Glucose has ranged from 133-230 mg/dl over the past 24 hours and patient has received a total of Novolog 17 units for correction over the past 24 hours. Patient is ordered Solumedrol 1000 mg daily which is likely cause of hyperglycemia. Please consider ordering low dose basal insulin. Recommend starting with Lantus 6 units Q24H starting now. Please note that if Lantus is ordered as recommended, it will likely need to be adjusted as steroids are tapered.  Thanks, Barnie Alderman, RN, MSN, CDE Diabetes Coordinator Inpatient Diabetes Program [phone] (Team Pager from [place] to [place]) [phone] ([facility] office) [phone] [facility] office) [phone] [facility] office)

## 2015-08-31 NOTE — Progress Notes (Signed)
Subjective: Improved mentation today.   Objective: Current vital signs: BP 125/53 mmHg  Pulse 70  Temp(Src) 97.8 F (36.6 C) (Oral)  Resp 18  Ht '5\' 3"'$  (1.6 m)  Wt 55.7 kg (122 lb 12.7 oz)  BMI 21.76 kg/m2  SpO2 99% Vital signs in last 24 hours: Temp:  [97.2 F (36.2 C)-98.2 F (36.8 C)] 97.8 F (36.6 C) (06/16 1440) Pulse Rate:  [48-71] 70 (06/16 1440) Resp:  [17-21] 18 (06/16 1440) BP: (125-145)/(51-57) 125/53 mmHg (06/16 1440) SpO2:  [89 %-99 %] 99 % (06/16 1440)  Intake/Output from previous day: 06/15 0701 - 06/16 0700 In: 3781.3 [I.V.:3083.8; IV Piggyback:697.5] Out: 5397 [Urine:1125] Intake/Output this shift:   Nutritional status: DIET DYS 3 Room service appropriate?: Yes; Fluid consistency:: Thin  Neurologic Exam: General Exam: [place]/AT. No cyanosis or diaphoresis noted. No rash.   Neurologic Exam: No seizures seen on bedside examination. Her mentation is improved relative to yesterday. Attends to both her right and left sides equally. Pupils equal. EOMI. Still nonverbal with poor attention and following only about 20% of all simple one-step commands. Upper and lower extremities difficult to formally test given difficulty following commands, but symmetric strength noted.   Lab Results: Results for orders placed or performed during the hospital encounter of 08/27/15 (from the past 48 hour(s))  Glucose, capillary     Status: Abnormal   Collection Time: 08/29/15  8:11 PM  Result Value Ref Range   Glucose-Capillary 137 (H) 65 - 99 mg/dL   Comment 1 Notify RN    Comment 2 Document in Chart   MRSA PCR Screening     Status: None   Collection Time: 08/29/15  9:28 PM  Result Value Ref Range   MRSA by PCR NEGATIVE NEGATIVE    Comment:        The GeneXpert MRSA Assay (FDA approved for NASAL specimens only), is one component of a comprehensive MRSA colonization surveillance program. It is not intended to diagnose MRSA infection nor to guide or monitor treatment  for MRSA infections.   Glucose, capillary     Status: Abnormal   Collection Time: 08/29/15 11:48 PM  Result Value Ref Range   Glucose-Capillary 138 (H) 65 - 99 mg/dL   Comment 1 Notify RN    Comment 2 Document in Chart   Glucose, capillary     Status: Abnormal   Collection Time: 08/30/15  3:57 AM  Result Value Ref Range   Glucose-Capillary 226 (H) 65 - 99 mg/dL   Comment 1 Notify RN    Comment 2 Document in Chart   Basic metabolic panel     Status: Abnormal   Collection Time: 08/30/15  6:11 AM  Result Value Ref Range   Sodium 137 135 - 145 mmol/L   Potassium 3.4 (L) 3.5 - 5.1 mmol/L    Comment: DELTA CHECK NOTED   Chloride 107 101 - 111 mmol/L   CO2 21 (L) 22 - 32 mmol/L   Glucose, Bld 167 (H) 65 - 99 mg/dL   BUN 9 6 - 20 mg/dL   Creatinine, Ser 0.84 0.44 - 1.00 mg/dL   Calcium 8.2 (L) 8.9 - 10.3 mg/dL   GFR calc non Af Amer >60 >60 mL/min   GFR calc Af Amer >60 >60 mL/min    Comment: (NOTE) The eGFR has been calculated using the CKD EPI equation. This calculation has not been validated in all clinical situations. eGFR's persistently <60 mL/min signify possible Chronic Kidney Disease.    Anion gap  9 5 - 15  Prolactin     Status: None   Collection Time: 08/30/15  6:11 AM  Result Value Ref Range   Prolactin 18.1 4.8 - 23.3 ng/mL    Comment: (NOTE) Performed At: [facility] [address] [place], [place] [id number] Mila Homer MD [id number]   Valproic acid level     Status: None   Collection Time: 08/30/15  6:11 AM  Result Value Ref Range   Valproic Acid Lvl 63 50.0 - 100.0 ug/mL  Glucose, capillary     Status: Abnormal   Collection Time: 08/30/15  8:32 AM  Result Value Ref Range   Glucose-Capillary 196 (H) 65 - 99 mg/dL  Glucose, capillary     Status: Abnormal   Collection Time: 08/30/15 11:35 AM  Result Value Ref Range   Glucose-Capillary 133 (H) 65 - 99 mg/dL   Comment 1 Notify RN    Comment 2 Document in Chart   Glucose, capillary      Status: Abnormal   Collection Time: 08/30/15  3:40 PM  Result Value Ref Range   Glucose-Capillary 191 (H) 65 - 99 mg/dL   Comment 1 Notify RN    Comment 2 Document in Chart   Glucose, capillary     Status: Abnormal   Collection Time: 08/30/15  7:51 PM  Result Value Ref Range   Glucose-Capillary 219 (H) 65 - 99 mg/dL   Comment 1 Notify RN   Glucose, capillary     Status: Abnormal   Collection Time: 08/30/15 11:38 PM  Result Value Ref Range   Glucose-Capillary 230 (H) 65 - 99 mg/dL  Glucose, capillary     Status: Abnormal   Collection Time: 08/31/15  3:25 AM  Result Value Ref Range   Glucose-Capillary 224 (H) 65 - 99 mg/dL  CBC     Status: Abnormal   Collection Time: 08/31/15  5:35 AM  Result Value Ref Range   WBC 5.1 4.0 - 10.5 K/uL   RBC 3.75 (L) 3.87 - 5.11 MIL/uL   Hemoglobin 10.9 (L) 12.0 - 15.0 g/dL   HCT 49.8 (L) 26.4 - 15.8 %   MCV 86.9 78.0 - 100.0 fL   MCH 29.1 26.0 - 34.0 pg   MCHC 33.4 30.0 - 36.0 g/dL   RDW 30.9 40.7 - 68.0 %   Platelets 229 150 - 400 K/uL  Comprehensive metabolic panel     Status: Abnormal   Collection Time: 08/31/15  5:35 AM  Result Value Ref Range   Sodium 139 135 - 145 mmol/L   Potassium 4.2 3.5 - 5.1 mmol/L    Comment: DELTA CHECK NOTED NO VISIBLE HEMOLYSIS    Chloride 109 101 - 111 mmol/L   CO2 19 (L) 22 - 32 mmol/L   Glucose, Bld 216 (H) 65 - 99 mg/dL   BUN 13 6 - 20 mg/dL   Creatinine, Ser 8.81 0.44 - 1.00 mg/dL   Calcium 8.6 (L) 8.9 - 10.3 mg/dL   Total Protein 5.0 (L) 6.5 - 8.1 g/dL   Albumin 2.3 (L) 3.5 - 5.0 g/dL   AST 22 15 - 41 U/L   ALT 25 14 - 54 U/L   Alkaline Phosphatase 22 (L) 38 - 126 U/L   Total Bilirubin 0.5 0.3 - 1.2 mg/dL   GFR calc non Af Amer >60 >60 mL/min   GFR calc Af Amer >60 >60 mL/min    Comment: (NOTE) The eGFR has been calculated using the CKD EPI equation. This calculation has  not been validated in all clinical situations. eGFR's persistently <60 mL/min signify possible Chronic Kidney Disease.     Anion gap 11 5 - 15  Magnesium     Status: None   Collection Time: 08/31/15  5:35 AM  Result Value Ref Range   Magnesium 1.9 1.7 - 2.4 mg/dL  TSH     Status: Abnormal   Collection Time: 08/31/15  5:35 AM  Result Value Ref Range   TSH 0.192 (L) 0.350 - 4.500 uIU/mL  Glucose, capillary     Status: Abnormal   Collection Time: 08/31/15  7:35 AM  Result Value Ref Range   Glucose-Capillary 215 (H) 65 - 99 mg/dL   Comment 1 Notify RN    Comment 2 Document in Chart   Valproic acid level     Status: Abnormal   Collection Time: 08/31/15 10:11 AM  Result Value Ref Range   Valproic Acid Lvl 120 (H) 50.0 - 100.0 ug/mL  Glucose, capillary     Status: Abnormal   Collection Time: 08/31/15 12:19 PM  Result Value Ref Range   Glucose-Capillary 347 (H) 65 - 99 mg/dL  Glucose, capillary     Status: Abnormal   Collection Time: 08/31/15  3:22 PM  Result Value Ref Range   Glucose-Capillary 350 (H) 65 - 99 mg/dL   Comment 1 Notify RN    Comment 2 Document in Chart     Recent Results (from the past 240 hour(s))  Blood culture (routine x 2)     Status: None (Preliminary result)   Collection Time: 08/27/15  9:24 AM  Result Value Ref Range Status   Specimen Description BLOOD LEFT ANTECUBITAL  Final   Special Requests BOTTLES DRAWN AEROBIC ONLY 6CC  Final   Culture NO GROWTH 3 DAYS  Final   Report Status PENDING  Incomplete  Blood culture (routine x 2)     Status: None (Preliminary result)   Collection Time: 08/27/15 11:28 AM  Result Value Ref Range Status   Specimen Description BLOOD LEFT ANTECUBITAL  Final   Special Requests BOTTLES DRAWN AEROBIC AND ANAEROBIC 6CC EACH  Final   Culture NO GROWTH 3 DAYS  Final   Report Status PENDING  Incomplete  MRSA PCR Screening     Status: None   Collection Time: 08/29/15  9:28 PM  Result Value Ref Range Status   MRSA by PCR NEGATIVE NEGATIVE Final    Comment:        The GeneXpert MRSA Assay (FDA approved for NASAL specimens only), is one component of  a comprehensive MRSA colonization surveillance program. It is not intended to diagnose MRSA infection nor to guide or monitor treatment for MRSA infections.     Lipid Panel No results for input(s): CHOL, TRIG, HDL, CHOLHDL, VLDL, LDLCALC in the last 72 hours.  Studies/Results: No results found.  Medications:   Current facility-administered medications:  .  0.9 %  sodium chloride infusion, , Intravenous, Continuous, Florencia Reasons, MD, Last Rate: 75 mL/hr at 08/31/15 0400 .  ALPRAZolam (XANAX) tablet 1 mg, 1 mg, Oral, TID PRN, Orvan Falconer, MD .  antiseptic oral rinse (CPC / CETYLPYRIDINIUM CHLORIDE 0.05%) solution 7 mL, 7 mL, Mouth Rinse, q12n4p, Florencia Reasons, MD, 7 mL at 08/31/15 1600 .  aspirin EC tablet 81 mg, 81 mg, Oral, Daily, Orvan Falconer, MD, 81 mg at 08/31/15 0909 .  chlorhexidine (PERIDEX) 0.12 % solution 15 mL, 15 mL, Mouth Rinse, BID, Florencia Reasons, MD, 15 mL at 08/31/15 0909 .  cycloSPORINE (  RESTASIS) 0.05 % ophthalmic emulsion 1 drop, 1 drop, Both Eyes, BID, Orvan Falconer, MD, 1 drop at 08/31/15 0909 .  heparin injection 5,000 Units, 5,000 Units, Subcutaneous, Q8H, Orvan Falconer, MD, 5,000 Units at 08/31/15 1438 .  insulin aspart (novoLOG) injection 0-9 Units, 0-9 Units, Subcutaneous, Q4H, Florencia Reasons, MD, 7 Units at 08/31/15 1543 .  levETIRAcetam (KEPPRA) 500 mg in sodium chloride 0.9 % 100 mL IVPB, 500 mg, Intravenous, Q12H, Gardiner Barefoot, NP, 500 mg at 08/31/15 0908 .  LORazepam (ATIVAN) injection 1-2 mg, 1-2 mg, Intravenous, Q2H PRN, Asencion Noble, MD, 1 mg at 08/30/15 1024 .  methylPREDNISolone sodium succinate (SOLU-MEDROL) 1,000 mg in sodium chloride 0.9 % 50 mL IVPB, 1,000 mg, Intravenous, Daily, Kerney Elbe, MD, 1,000 mg at 08/31/15 0936 .  sodium chloride flush (NS) 0.9 % injection 3 mL, 3 mL, Intravenous, Q12H, Orvan Falconer, MD, 3 mL at 08/31/15 0909 .  valproate (DEPACON) 750 mg in dextrose 5 % 50 mL IVPB, 750 mg, Intravenous, Q8H, Marliss Coots, PA-C, 750 mg at 08/31/15 1438   Impression: 1.  New onset of epileptic seizures and AMS. Have responded partially to Keppra and Depakote. Valproic acid level is 120 today, supratherapeutic. Her MRI scan showed no abnormal cortical hyperintensity. She is improving clinically. 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  2. Per patient's husband, she has no prior history of autoimmune disease.  3. COPD.  4. DM.  5. Low TSH level. 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   Recommendations:  1. Continue empiric pulsed dose steroids: IV Solumedrol 1000 mg qd x 5 days.  2. Monitor glucose levels closely while on IV steroids.  3. CBC and Chem7 qd while on IV steroids.  4. If possible, obtain a CSF IgG and albumin level if the CSF from her recent LP is still available in the lab.  5. Serum anti-NMDA and anti-VGKC antibody titers.  6. PRN Ativan 7. Supplemental load of valproic acid 500 mg IV has not resulted in cessation of her seizures this AM.  8. Have increased her scheduled valproic acid dose by 50%. Continue at this dose given clinical improvement, despite supratherapeutic lab value, as she is not exhibiting clinical signs of toxicity. Repeat valproic acid level Saturday morning.  9. Continue Keppra at 500 mg BID.  10. Draw serum anti-thyroglobulin antibody and anti-thyroid microsomal antibody titers (Hashimoto's encephalopathy).    LOS: 2 days   '@Electronically'$  signed: Dr. Kerney Elbe 08/31/2015  7:15 PM

## 2015-08-31 NOTE — Progress Notes (Signed)
Speech Language Pathology Treatment: Dysphagia  Patient Details Name: Veronica Moses MRN: [id number] DOB: 1955/06/30 Today's Date: 08/31/2015 Time: 0822-0849 SLP Time Calculation (min) (ACUTE ONLY): 27 min  Assessment / Plan / Recommendation Clinical Impression  Pt easily awoken and able to maintain alertness for po trials. Oral and pharyngeal abilities appeared functional with one delayed throat clear. Significant improvements from 6/14. Cognitively pt requires min cueing to follow swallow precautions. Recommend Dys 3 texture, thin liquids, straws allowed, pills whole in applesauce, full supervision and continued ST for safety and efficiency.    HPI HPI: 60 y.o. female with hx of COPD, DM, status epilepticus, UTI admitted with lethargy and found to have urinary incontinence. Her CXR showed streaky lingular and left basilar airspace disease could be due to atelectasis or pneumonia. CT showed nothing acute. Per chart she was felt to have another seizure, and possibly had aspiration. No prior ST notes.      SLP Plan  Continue with current plan of care     Recommendations  Diet recommendations: Dysphagia 3 (mechanical soft);Thin liquid Liquids provided via: Cup;Straw Medication Administration: Whole meds with puree Supervision: Patient able to self feed;Full supervision/cueing for compensatory strategies Compensations: Slow rate;Small sips/bites Postural Changes and/or Swallow Maneuvers: Seated upright 90 degrees             Oral Care Recommendations: Oral care BID Follow up Recommendations:  (TBD) Plan: Continue with current plan of care     GO                Houston Siren 08/31/2015, 9:00 AM  Orbie Pyo Colvin Caroli.[employer] [phone]

## 2015-09-01 LAB — GLUCOSE, CAPILLARY
GLUCOSE-CAPILLARY: 190 mg/dL — AB (ref 65–99)
GLUCOSE-CAPILLARY: 242 mg/dL — AB (ref 65–99)
GLUCOSE-CAPILLARY: 308 mg/dL — AB (ref 65–99)
GLUCOSE-CAPILLARY: 366 mg/dL — AB (ref 65–99)
Glucose-Capillary: 154 mg/dL — ABNORMAL HIGH (ref 65–99)
Glucose-Capillary: 259 mg/dL — ABNORMAL HIGH (ref 65–99)
Glucose-Capillary: 403 mg/dL — ABNORMAL HIGH (ref 65–99)

## 2015-09-01 LAB — CBC
HCT: 33.5 % — ABNORMAL LOW (ref 36.0–46.0)
HEMOGLOBIN: 11.1 g/dL — AB (ref 12.0–15.0)
MCH: 28.8 pg (ref 26.0–34.0)
MCHC: 33.1 g/dL (ref 30.0–36.0)
MCV: 86.8 fL (ref 78.0–100.0)
Platelets: 243 10*3/uL (ref 150–400)
RBC: 3.86 MIL/uL — AB (ref 3.87–5.11)
RDW: 13.8 % (ref 11.5–15.5)
WBC: 5.8 10*3/uL (ref 4.0–10.5)

## 2015-09-01 LAB — BASIC METABOLIC PANEL
ANION GAP: 9 (ref 5–15)
BUN: 15 mg/dL (ref 6–20)
CHLORIDE: 108 mmol/L (ref 101–111)
CO2: 21 mmol/L — ABNORMAL LOW (ref 22–32)
Calcium: 9.1 mg/dL (ref 8.9–10.3)
Creatinine, Ser: 0.81 mg/dL (ref 0.44–1.00)
GFR calc Af Amer: >60 mL/min (ref >60)
GFR calc non Af Amer: >60 mL/min (ref >60)
Glucose, Bld: 185 mg/dL — ABNORMAL HIGH (ref 65–99)
POTASSIUM: 3.7 mmol/L (ref 3.5–5.1)
SODIUM: 138 mmol/L (ref 135–145)

## 2015-09-01 LAB — CULTURE, BLOOD (ROUTINE X 2)
CULTURE: NO GROWTH
Culture: NO GROWTH

## 2015-09-01 LAB — T4, FREE: Free T4: 0.97 ng/dL (ref 0.61–1.12)

## 2015-09-01 LAB — VALPROIC ACID LEVEL: Valproic Acid Lvl: 109 ug/mL — ABNORMAL HIGH (ref 50.0–100.0)

## 2015-09-01 LAB — PROLACTIN: PROLACTIN: 17.1 ng/mL (ref 4.8–23.3)

## 2015-09-01 LAB — T3, FREE: T3 FREE: 1.1 pg/mL — AB (ref 2.0–4.4)

## 2015-09-01 MED ORDER — INSULIN ASPART 100 UNIT/ML ~~LOC~~ SOLN
0.0000 [IU] | SUBCUTANEOUS | Status: DC
  Administered 2015-09-02: 4 [IU] via SUBCUTANEOUS
  Administered 2015-09-02: 7 [IU] via SUBCUTANEOUS
  Administered 2015-09-02: 11 [IU] via SUBCUTANEOUS
  Administered 2015-09-02: 4 [IU] via SUBCUTANEOUS
  Administered 2015-09-02 – 2015-09-03 (×3): 11 [IU] via SUBCUTANEOUS
  Administered 2015-09-03: 4 [IU] via SUBCUTANEOUS
  Administered 2015-09-03: 11 [IU] via SUBCUTANEOUS
  Administered 2015-09-03: 3 [IU] via SUBCUTANEOUS
  Administered 2015-09-04: 11 [IU] via SUBCUTANEOUS
  Administered 2015-09-04 (×2): 4 [IU] via SUBCUTANEOUS
  Administered 2015-09-04 (×2): 3 [IU] via SUBCUTANEOUS
  Administered 2015-09-05: 4 [IU] via SUBCUTANEOUS
  Administered 2015-09-05: 15 [IU] via SUBCUTANEOUS

## 2015-09-01 MED ORDER — INSULIN ASPART 100 UNIT/ML ~~LOC~~ SOLN
21.0000 [IU] | Freq: Once | SUBCUTANEOUS | Status: AC
  Administered 2015-09-01: 21 [IU] via SUBCUTANEOUS

## 2015-09-01 MED ORDER — GABAPENTIN 100 MG PO CAPS
100.0000 mg | ORAL_CAPSULE | Freq: Every day | ORAL | Status: DC
  Administered 2015-09-01 – 2015-09-04 (×3): 100 mg via ORAL
  Filled 2015-09-01 (×3): qty 1

## 2015-09-01 MED ORDER — SENNOSIDES-DOCUSATE SODIUM 8.6-50 MG PO TABS
2.0000 | ORAL_TABLET | Freq: Every day | ORAL | Status: DC
  Administered 2015-09-01 – 2015-09-02 (×2): 2 via ORAL
  Filled 2015-09-01 (×2): qty 2

## 2015-09-01 MED ORDER — AMLODIPINE BESYLATE 5 MG PO TABS
5.0000 mg | ORAL_TABLET | Freq: Every day | ORAL | Status: DC
  Administered 2015-09-01 – 2015-09-03 (×3): 5 mg via ORAL
  Filled 2015-09-01 (×3): qty 1

## 2015-09-01 MED ORDER — POTASSIUM CHLORIDE CRYS ER 20 MEQ PO TBCR
40.0000 meq | EXTENDED_RELEASE_TABLET | Freq: Once | ORAL | Status: AC
  Administered 2015-09-01: 40 meq via ORAL
  Filled 2015-09-01: qty 2

## 2015-09-01 NOTE — Progress Notes (Signed)
PROGRESS NOTE  Veronica Moses [id number] DOB: 1955/08/21 DOA: 08/27/2015 PCP: Alonza Bogus, MD  HPI/Recap of past 24 hours:  Last seizure episode was 9pm on 6/15 per family Today she continue to improve, more alert and start to talk, oriented to person, not to time or place, following commands,  tolerating diet husband in room  Assessment/Plan: Principal Problem:   Seizure ([place]) Active Problems:   Diabetes ([place])   COPD (chronic obstructive pulmonary disease) ([place])   Protein-calorie malnutrition, severe ([place])   HCAP (healthcare-associated pneumonia)   Psychosocial stressors   Acute encephalopathy   Insulin dependent diabetes mellitus ([place])   Hypokalemia  Seizures:  With recent hospitalization from 6/5 to 6/10 at [facility] , she was intubation for airway protection during hospitalization at [facility]. initial concern for pseudoseizure due to family reported recent stressors at home, however EEG + focal seizure, meds adjusted, neurology and psychiatry input appreciated.she was transferred to stepdown on 6/15 due to frequent seizure episodes She was Kept  Npo on 6/14 and 6/15, she passed swallow eval on 6/16 ,started diet, continue seizure precaution and aspiration precaution  neuro adjusting seizure meds and started pulse dose of steroids for possible autoimmune etiology, seizure management and work up per neuro, appreciate neurology input   Insulin dependent dm2,  recent a1c 5.3, with hypoglycemic episodes , lowest 36, requiring d5 infusion briefly, current blood sugar elevated, on ssi Need to continue adjust insulin due to steroids use, started lantus 10units qhs on 6/16   aki: cr 1.27, baseline 0.7, ua no infection, adequate urine output, continue hydration, cr normalized.  Aspiration pna? No cough, no fever, no hypoxia, no leukocytosis, will d/c iv abx for now and observe  Hypokalemia: replace k,  Mag 1.9  HTN: steroid induced? Start norvasc.  Suppressed tsh,  free t4 wnl.   Code Status: full  Family Communication: patient and family  Disposition Plan: remain in stepdown for another 24hrs, likely able to med tele if remain seizure free   Consultants:  neurology  Procedures:  EEG  Antibiotics:  vanc /zosyn from admission to 6/14   Objective: BP 158/49 mmHg  Pulse 52  Temp(Src) 97.6 F (36.4 C) (Oral)  Resp 19  Ht 5\' 3"  (1.6 m)  Wt 55.7 kg (122 lb 12.7 oz)  BMI 21.76 kg/m2  SpO2 97%  Intake/Output Summary (Last 24 hours) at 09/01/15 0932 Last data filed at 09/01/15 0830  Gross per 24 hour  Intake 1887.5 ml  Output   1525 ml  Net  362.5 ml   Filed Weights   08/28/15 0408 08/28/15 1933 08/30/15 1203  Weight: 55.1 kg (121 lb 7.6 oz) 57.335 kg (126 lb 6.4 oz) 55.7 kg (122 lb 12.7 oz)    Exam:   General:  NAD, much more alert, talk more, oriented to person only, follow commands  Cardiovascular: RRR  Respiratory: CTABL  Abdomen: Soft/ND/NT, positive BS  Musculoskeletal: No Edema  Neuro: much more alert, talk more, oriented to person only, follow commands, no focal deficit appreciated   Data Reviewed: Basic Metabolic Panel:  Recent Labs Lab 08/27/15 0924 08/28/15 0434 08/29/15 0934 08/30/15 0611 08/31/15 0535 09/01/15 0458  NA 139 139 134* 137 139 138  K 3.0* 2.6* 4.6 3.4* 4.2 3.7  CL 101 107 101 107 109 108  CO2 29 26 17* 21* 19* 21*  GLUCOSE 193* 123* 414* 167* 216* 185*  BUN 10 8 12 9 13 15   CREATININE 0.79 0.73 1.27* 0.84 0.90 0.81  CALCIUM 8.3* 8.3* 8.8* 8.2* 8.6* 9.1  MG 1.5*  --  1.7  --  1.9  --    Liver Function Tests:  Recent Labs Lab 08/27/15 0924 08/29/15 1631 08/31/15 0535  AST 84* 32 22  ALT 54 41 25  ALKPHOS 25* 28* 22*  BILITOT 0.7 1.7* 0.5  PROT 6.0* 6.0* 5.0*  ALBUMIN 3.2* 3.0* 2.3*   No results for input(s): LIPASE, AMYLASE in the last 168 hours.  Recent Labs Lab 08/27/15 0924  AMMONIA 14   CBC:  Recent Labs Lab 08/27/15 0924 08/28/15 0434 08/31/15 0535  09/01/15 0458  WBC 5.4 5.9 5.1 5.8  HGB 12.0 11.5* 10.9* 11.1*  HCT 35.2* 33.0* 32.6* 33.5*  MCV 89.8 88.7 86.9 86.8  PLT 224 234 229 243   Cardiac Enzymes:    Recent Labs Lab 08/27/15 0924  TROPONINI 0.03   BNP (last 3 results) No results for input(s): BNP in the last 8760 hours.  ProBNP (last 3 results) No results for input(s): PROBNP in the last 8760 hours.  CBG:  Recent Labs Lab 08/31/15 1522 08/31/15 1955 08/31/15 2338 09/01/15 0316 09/01/15 0828  GLUCAP 350* 372* 308* 190* 154*    Recent Results (from the past 240 hour(s))  Blood culture (routine x 2)     Status: None (Preliminary result)   Collection Time: 08/27/15  9:24 AM  Result Value Ref Range Status   Specimen Description BLOOD LEFT ANTECUBITAL  Final   Special Requests BOTTLES DRAWN AEROBIC ONLY 6CC  Final   Culture NO GROWTH 3 DAYS  Final   Report Status PENDING  Incomplete  Blood culture (routine x 2)     Status: None (Preliminary result)   Collection Time: 08/27/15 11:28 AM  Result Value Ref Range Status   Specimen Description BLOOD LEFT ANTECUBITAL  Final   Special Requests BOTTLES DRAWN AEROBIC AND ANAEROBIC 6CC EACH  Final   Culture NO GROWTH 3 DAYS  Final   Report Status PENDING  Incomplete  MRSA PCR Screening     Status: None   Collection Time: 08/29/15  9:28 PM  Result Value Ref Range Status   MRSA by PCR NEGATIVE NEGATIVE Final    Comment:        The GeneXpert MRSA Assay (FDA approved for NASAL specimens only), is one component of a comprehensive MRSA colonization surveillance program. It is not intended to diagnose MRSA infection nor to guide or monitor treatment for MRSA infections.      Studies: No results found.  Scheduled Meds: . antiseptic oral rinse  7 mL Mouth Rinse q12n4p  . aspirin EC  81 mg Oral Daily  . chlorhexidine  15 mL Mouth Rinse BID  . cycloSPORINE  1 drop Both Eyes BID  . heparin  5,000 Units Subcutaneous Q8H  . insulin aspart  0-9 Units Subcutaneous  Q4H  . insulin glargine  10 Units Subcutaneous QHS  . levETIRAcetam  500 mg Intravenous Q12H  . methylPREDNISolone (SOLU-MEDROL) injection  1,000 mg Intravenous Daily  . sodium chloride flush  3 mL Intravenous Q12H  . valproate sodium  750 mg Intravenous Q8H    Continuous Infusions: . sodium chloride 75 mL/hr at 08/31/15 2017     Time spent: 3mins  Lamari Beckles MD, PhD  [facility] Pager [phone]. If 7PM-7AM, please contact night-coverage at [email or web address], password [facility] 09/01/2015, 9:32 AM  LOS: 3 days

## 2015-09-02 LAB — BASIC METABOLIC PANEL
Anion gap: 10 (ref 5–15)
BUN: 12 mg/dL (ref 6–20)
CHLORIDE: 106 mmol/L (ref 101–111)
CO2: 23 mmol/L (ref 22–32)
CREATININE: 0.8 mg/dL (ref 0.44–1.00)
Calcium: 8.9 mg/dL (ref 8.9–10.3)
GFR calc non Af Amer: >60 mL/min (ref >60)
Glucose, Bld: 147 mg/dL — ABNORMAL HIGH (ref 65–99)
Potassium: 3.6 mmol/L (ref 3.5–5.1)
SODIUM: 139 mmol/L (ref 135–145)

## 2015-09-02 LAB — VALPROIC ACID LEVEL: Valproic Acid Lvl: 114 ug/mL — ABNORMAL HIGH (ref 50.0–100.0)

## 2015-09-02 LAB — GLUCOSE, CAPILLARY
GLUCOSE-CAPILLARY: 187 mg/dL — AB (ref 65–99)
GLUCOSE-CAPILLARY: 267 mg/dL — AB (ref 65–99)
Glucose-Capillary: 184 mg/dL — ABNORMAL HIGH (ref 65–99)
Glucose-Capillary: 269 mg/dL — ABNORMAL HIGH (ref 65–99)
Glucose-Capillary: 289 mg/dL — ABNORMAL HIGH (ref 65–99)

## 2015-09-02 LAB — MAGNESIUM: Magnesium: 2 mg/dL (ref 1.7–2.4)

## 2015-09-02 MED ORDER — POTASSIUM CHLORIDE CRYS ER 20 MEQ PO TBCR
40.0000 meq | EXTENDED_RELEASE_TABLET | Freq: Once | ORAL | Status: AC
  Administered 2015-09-02: 40 meq via ORAL
  Filled 2015-09-02: qty 2

## 2015-09-02 MED ORDER — ACETAMINOPHEN 500 MG PO TABS
500.0000 mg | ORAL_TABLET | Freq: Four times a day (QID) | ORAL | Status: DC | PRN
  Administered 2015-09-05: 500 mg via ORAL
  Filled 2015-09-02 (×3): qty 1

## 2015-09-02 MED ORDER — POLYETHYLENE GLYCOL 3350 17 G PO PACK
17.0000 g | PACK | Freq: Every day | ORAL | Status: DC
  Administered 2015-09-03: 17 g via ORAL
  Filled 2015-09-02: qty 1

## 2015-09-02 NOTE — Progress Notes (Signed)
PROGRESS NOTE  Veronica Moses [id number] DOB: 06-23-1955 DOA: 08/27/2015 PCP: Alonza Bogus, MD  HPI/Recap of past 24 hours:  Last seizure episode was 9pm on 6/15   Today she continue to improve, more alert and start to talk, oriented to person and time, states she is in [facility], sitting up in chair,  tolerating diet  husband in room  Assessment/Plan: Principal Problem:   Seizure ([place]) Active Problems:   Diabetes ([place])   COPD (chronic obstructive pulmonary disease) ([place])   Protein-calorie malnutrition, severe ([place])   HCAP (healthcare-associated pneumonia)   Psychosocial stressors   Acute encephalopathy   Insulin dependent diabetes mellitus ([place])   Hypokalemia  Seizures:  With recent hospitalization from 6/5 to 6/10 at [facility] , she was intubation for airway protection during hospitalization at [facility]. initial concern for pseudoseizure due to family reported recent stressors at home, however EEG + focal seizure, meds adjusted, neurology and psychiatry input appreciated.she was transferred to stepdown on 6/15 due to frequent seizure episodes She was Kept  Npo on 6/14 and 6/15, she passed swallow eval on 6/16 ,started diet, continue seizure precaution and aspiration precaution  neuro adjusting seizure meds and started pulse dose of steroids for possible autoimmune etiology, seizure management and work up per neuro, appreciate neurology input   Insulin dependent dm2,  recent a1c 5.3, with hypoglycemic episodes , lowest 36, requiring d5 infusion briefly, current blood sugar elevated, on ssi Need to continue adjust insulin due to steroids use, started lantus 10units qhs on 6/16   aki: cr 1.27, baseline 0.7, ua no infection, adequate urine output, continue hydration, cr normalized.  Aspiration pna? No cough, no fever, no hypoxia, no leukocytosis, will d/c iv abx for now and observe  Hypokalemia: replace k,  Mag 1.9  HTN: steroid induced? Start  norvasc.  Suppressed tsh, free t4 wnl.   Code Status: full  Family Communication: patient and family  Disposition Plan: to med tele    Consultants:  neurology  Procedures:  EEG 6/7 , 6/19  Antibiotics:  vanc /zosyn from admission to 6/14   Objective: BP 162/56 mmHg  Pulse 56  Temp(Src) 98 F (36.7 C) (Oral)  Resp 15  Ht 5\' 3"  (1.6 m)  Wt 55.7 kg (122 lb 12.7 oz)  BMI 21.76 kg/m2  SpO2 95%  Intake/Output Summary (Last 24 hours) at 09/02/15 0841 Last data filed at 09/02/15 0600  Gross per 24 hour  Intake   1020 ml  Output   2225 ml  Net  -1205 ml   Filed Weights   08/28/15 0408 08/28/15 1933 08/30/15 1203  Weight: 55.1 kg (121 lb 7.6 oz) 57.335 kg (126 lb 6.4 oz) 55.7 kg (122 lb 12.7 oz)    Exam:   General:  NAD, much more alert, talk more, oriented to person and time, know she is in the hospital, but did not state the correct name, follow commands  Cardiovascular: RRR  Respiratory: CTABL  Abdomen: Soft/ND/NT, positive BS  Musculoskeletal: No Edema  Neuro: much more alert, talk more,  follow commands, no focal deficit appreciated   Data Reviewed: Basic Metabolic Panel:  Recent Labs Lab 08/27/15 0924  08/29/15 0934 08/30/15 0611 08/31/15 0535 09/01/15 0458 09/02/15 0516  NA 139  < > 134* 137 139 138 139  K 3.0*  < > 4.6 3.4* 4.2 3.7 3.6  CL 101  < > 101 107 109 108 106  CO2 29  < > 17* 21* 19* 21* 23  GLUCOSE 193*  < > 414* 167* 216* 185* 147*  BUN 10  < > 12 9 13 15 12   CREATININE 0.79  < > 1.27* 0.84 0.90 0.81 0.80  CALCIUM 8.3*  < > 8.8* 8.2* 8.6* 9.1 8.9  MG 1.5*  --  1.7  --  1.9  --  2.0  < > = values in this interval not displayed. Liver Function Tests:  Recent Labs Lab 08/27/15 0924 08/29/15 1631 08/31/15 0535  AST 84* 32 22  ALT 54 41 25  ALKPHOS 25* 28* 22*  BILITOT 0.7 1.7* 0.5  PROT 6.0* 6.0* 5.0*  ALBUMIN 3.2* 3.0* 2.3*   No results for input(s): LIPASE, AMYLASE in the last 168 hours.  Recent Labs Lab  08/27/15 0924  AMMONIA 14   CBC:  Recent Labs Lab 08/27/15 0924 08/28/15 0434 08/31/15 0535 09/01/15 0458  WBC 5.4 5.9 5.1 5.8  HGB 12.0 11.5* 10.9* 11.1*  HCT 35.2* 33.0* 32.6* 33.5*  MCV 89.8 88.7 86.9 86.8  PLT 224 234 229 243   Cardiac Enzymes:    Recent Labs Lab 08/27/15 0924  TROPONINI 0.03   BNP (last 3 results) No results for input(s): BNP in the last 8760 hours.  ProBNP (last 3 results) No results for input(s): PROBNP in the last 8760 hours.  CBG:  Recent Labs Lab 09/01/15 1541 09/01/15 1944 09/01/15 2359 09/02/15 0400 09/02/15 0817  GLUCAP 366* 403* 242* 187* 184*    Recent Results (from the past 240 hour(s))  Blood culture (routine x 2)     Status: None   Collection Time: 08/27/15  9:24 AM  Result Value Ref Range Status   Specimen Description BLOOD LEFT ANTECUBITAL  Final   Special Requests BOTTLES DRAWN AEROBIC ONLY 6CC  Final   Culture NO GROWTH 5 DAYS  Final   Report Status 09/01/2015 FINAL  Final  Blood culture (routine x 2)     Status: None   Collection Time: 08/27/15 11:28 AM  Result Value Ref Range Status   Specimen Description BLOOD LEFT ANTECUBITAL  Final   Special Requests BOTTLES DRAWN AEROBIC AND ANAEROBIC [place]  Final   Culture NO GROWTH 5 DAYS  Final   Report Status 09/01/2015 FINAL  Final  MRSA PCR Screening     Status: None   Collection Time: 08/29/15  9:28 PM  Result Value Ref Range Status   MRSA by PCR NEGATIVE NEGATIVE Final    Comment:        The GeneXpert MRSA Assay (FDA approved for NASAL specimens only), is one component of a comprehensive MRSA colonization surveillance program. It is not intended to diagnose MRSA infection nor to guide or monitor treatment for MRSA infections.      Studies: No results found.  Scheduled Meds: . amLODipine  5 mg Oral Daily  . antiseptic oral rinse  7 mL Mouth Rinse q12n4p  . aspirin EC  81 mg Oral Daily  . chlorhexidine  15 mL Mouth Rinse BID  . cycloSPORINE  1 drop  Both Eyes BID  . gabapentin  100 mg Oral QHS  . heparin  5,000 Units Subcutaneous Q8H  . insulin aspart  0-20 Units Subcutaneous Q4H  . insulin glargine  10 Units Subcutaneous QHS  . levETIRAcetam  500 mg Intravenous Q12H  . methylPREDNISolone (SOLU-MEDROL) injection  1,000 mg Intravenous Daily  . potassium chloride  40 mEq Oral Once  . senna-docusate  2 tablet Oral QHS  . sodium chloride flush  3 mL  Intravenous Q12H  . valproate sodium  750 mg Intravenous Q8H    Continuous Infusions: . sodium chloride 75 mL/hr at 09/01/15 1900     Time spent: 51mins  Orland Visconti MD, PhD  [facility] Pager [phone]. If 7PM-7AM, please contact night-coverage at [email or web address], password [facility] 09/02/2015, 8:41 AM  LOS: 4 days

## 2015-09-02 NOTE — Progress Notes (Signed)
Repeat EEG has been ordered for Monday to assess for improvement of possible autoimmune encephalopathy after completions of pulsed-dose steroid therapy (5 days).   Electronically signed: Dr. Kerney Elbe

## 2015-09-03 ENCOUNTER — Inpatient Hospital Stay (HOSPITAL_COMMUNITY): Payer: Medicaid Other

## 2015-09-03 ENCOUNTER — Inpatient Hospital Stay (HOSPITAL_COMMUNITY)
Admit: 2015-09-03 | Discharge: 2015-09-03 | Disposition: A | Discharge status: Home / Self Care | Payer: Medicaid Other | Attending: Neurology | Admitting: Neurology

## 2015-09-03 DIAGNOSIS — R569 Unspecified convulsions: Secondary | ICD-10-CM

## 2015-09-03 LAB — GLUCOSE, CAPILLARY
GLUCOSE-CAPILLARY: 132 mg/dL — AB (ref 65–99)
GLUCOSE-CAPILLARY: 117 mg/dL — AB (ref 65–99)
Glucose-Capillary: 161 mg/dL — ABNORMAL HIGH (ref 65–99)
Glucose-Capillary: 163 mg/dL — ABNORMAL HIGH (ref 65–99)
Glucose-Capillary: 198 mg/dL — ABNORMAL HIGH (ref 65–99)
Glucose-Capillary: 290 mg/dL — ABNORMAL HIGH (ref 65–99)
Glucose-Capillary: 294 mg/dL — ABNORMAL HIGH (ref 65–99)

## 2015-09-03 LAB — BASIC METABOLIC PANEL
Anion gap: 7 (ref 5–15)
BUN: 11 mg/dL (ref 6–20)
CALCIUM: 9 mg/dL (ref 8.9–10.3)
CO2: 29 mmol/L (ref 22–32)
CREATININE: 0.79 mg/dL (ref 0.44–1.00)
Chloride: 101 mmol/L (ref 101–111)
GFR calc Af Amer: >60 mL/min (ref >60)
GLUCOSE: 108 mg/dL — AB (ref 65–99)
Potassium: 3.6 mmol/L (ref 3.5–5.1)
Sodium: 137 mmol/L (ref 135–145)

## 2015-09-03 LAB — VALPROIC ACID LEVEL: VALPROIC ACID LVL: 118 ug/mL — AB (ref 50.0–100.0)

## 2015-09-03 MED ORDER — POTASSIUM CHLORIDE IN NACL 20-0.9 MEQ/L-% IV SOLN
INTRAVENOUS | Status: DC
  Administered 2015-09-03 – 2015-09-04 (×2): via INTRAVENOUS
  Filled 2015-09-03 (×3): qty 1000

## 2015-09-03 MED ORDER — DOXYCYCLINE HYCLATE 100 MG IV SOLR
100.0000 mg | Freq: Two times a day (BID) | INTRAVENOUS | Status: DC
  Administered 2015-09-03 – 2015-09-05 (×5): 100 mg via INTRAVENOUS
  Filled 2015-09-03 (×7): qty 100

## 2015-09-03 MED ORDER — SODIUM CHLORIDE 0.9 % IV SOLN
500.0000 mg | Freq: Two times a day (BID) | INTRAVENOUS | Status: DC
  Administered 2015-09-03 – 2015-09-04 (×2): 500 mg via INTRAVENOUS
  Filled 2015-09-03 (×3): qty 5

## 2015-09-03 MED ORDER — KETOROLAC TROMETHAMINE 30 MG/ML IJ SOLN
30.0000 mg | Freq: Once | INTRAMUSCULAR | Status: AC
  Administered 2015-09-03: 30 mg via INTRAVENOUS
  Filled 2015-09-03: qty 1

## 2015-09-03 MED ORDER — HYDRALAZINE HCL 20 MG/ML IJ SOLN: 10.0000 mg | Freq: Three times a day (TID) | INTRAMUSCULAR | Status: DC | PRN

## 2015-09-03 MED ORDER — SODIUM CHLORIDE 0.9 % IV SOLN
100.0000 mg | Freq: Two times a day (BID) | INTRAVENOUS | Status: DC
  Administered 2015-09-03 – 2015-09-04 (×2): 100 mg via INTRAVENOUS
  Filled 2015-09-03 (×4): qty 10

## 2015-09-03 MED ORDER — METOCLOPRAMIDE HCL 5 MG/ML IJ SOLN
10.0000 mg | Freq: Once | INTRAMUSCULAR | Status: AC
  Administered 2015-09-03: 10 mg via INTRAVENOUS
  Filled 2015-09-03: qty 2

## 2015-09-03 MED ORDER — DOXYCYCLINE HYCLATE 100 MG PO TABS: 100.0000 mg | ORAL_TABLET | Freq: Two times a day (BID) | ORAL | Status: DC

## 2015-09-03 MED ORDER — VALPROATE SODIUM 500 MG/5ML IV SOLN
500.0000 mg | Freq: Three times a day (TID) | INTRAVENOUS | Status: DC
  Filled 2015-09-03 (×2): qty 5

## 2015-09-03 MED ORDER — SODIUM CHLORIDE 0.9 % IV SOLN
1000.0000 mg | Freq: Two times a day (BID) | INTRAVENOUS | Status: DC
  Filled 2015-09-03: qty 10

## 2015-09-03 NOTE — Progress Notes (Signed)
CM met with the patient, her daughter and her sister. Patient has a PCP but according to the family he is retiring. He has been assisting the patient with her insulin but she still has been running out at home. Pt is waiting on her Medicaid to come through. CM asked about the [facility] as a PCP and to assist the patient to afford her medications. The patients daughter states that they would like to use the [facility] since they will be coming this way from [facility] for her Neurology appointments. CM was able to obtain the patient an appointment at the [facility] who is doing overflow for the [facility] and placed it on the AVS. CM went over with the family the use of the pharmacy at the [facility]. They voiced understanding. CM also informed them that if the patient were to discharge late in the day or over the weekend that we could also assist with a [place] letter if needed. CM will continue to follow for further d/c needs.

## 2015-09-03 NOTE — Procedures (Signed)
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    Description: The EEG background is symmetric, with generalized slowing of 3Hz  delta and 5-7 Hz theta frequencies.  Sharply-contoured waves are noted in the left frontal region.  No clear epileptiform discharges or electrographic seizures seen.  ECG revealed normal cardiac rate and rhythm.  Impression: This is an abnormal EEG due to generalized background slowing and left frontal sharp waves.  These findings are indicative of encephalopathy, as well as possible seizure focus from the left frontal region.  Adam R. Tomi Likens, DO

## 2015-09-03 NOTE — Discharge Summary (Signed)
NAMELAVOLA, CUTCHER NO.:  [id number]  MEDICAL RECORD NO.:  [id number]  LOCATION:  5M03C                        FACILITY:  [facility]  PHYSICIAN:  Ayelet Gruenewald L. Luan Pulling, M.D.DATE OF BIRTH:  Mar 01, 1956  DATE OF ADMISSION:  08/27/2015 DATE OF DISCHARGE:  LH                              DISCHARGE SUMMARY   ADDENDUM:  DISCHARGE DIAGNOSIS:  Respiratory failure related to status epilepticus.  FINAL DISCHARGE DIAGNOSIS:  Elevated blood sugar, but diabetic ketoacidosis ruled out.     Koury Roddy L. Luan Pulling, M.D.     ELH/MEDQ  D:  09/03/2015  T:  09/03/2015  Job:  [id number]

## 2015-09-03 NOTE — Progress Notes (Addendum)
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  sister in room  Assessment/Plan: Principal Problem:   Seizure ([place]) Active Problems:   Diabetes ([place])   COPD (chronic obstructive pulmonary disease) ([facility])   Protein-calorie malnutrition, severe ([facility])   HCAP (healthcare-associated pneumonia)   Psychosocial stressors   Acute encephalopathy   Insulin dependent diabetes mellitus ([place])   Hypokalemia  Seizures:  With recent hospitalization from 6/5 to 6/10 at [facility] , she was intubation for airway protection during hospitalization at [facility]. initial concern for pseudoseizure due to family reported recent stressors at home, however EEG + focal seizure, meds adjusted, neurology and psychiatry input appreciated.she was transferred to stepdown on 6/15 due to frequent seizure episodes She was Kept  Npo on 6/14 and 6/15, she passed swallow eval on 6/16 ,started diet, became drowsy and in deep sleep on 6/19, change diet order to npo, all meds per IV continue seizure precaution and aspiration precaution  neuro adjusting seizure meds and started pulse dose of steroids for possible autoimmune etiology, seizure management and work up per neuro, appreciate neurology input   Positive RMSF titer (both IgM and IgG), test was done at [facility] during last hospitalization:  will treat with iv doxycycline for now due to NPO, transition to oral doxycycline once able to take oral meds, plan to treat total of 21days with doxycycline.  Insulin dependent dm2,  recent a1c 5.3, with hypoglycemic episodes , lowest 36,  requiring d5 infusion briefly, current blood sugar elevated, on ssi Need to continue adjust insulin due to steroids use, started lantus 10units qhs on 6/16 , d/c lantus on 6/19 due to npo and finishing steroids   aki: cr 1.27, baseline 0.7, ua no infection, adequate urine output, continue hydration, cr normalized.  Aspiration pna? No cough, no fever, no hypoxia, no leukocytosis, will d/c iv abx for now and observe  Hypokalemia: replace k,  Mag 1.9 Start ns with k on 6/19 due to npo.  HTN: steroid induced? Started norvasc. Now npo, iv prn hydralazine for sbp>180  Suppressed tsh, free t4 wnl.   Skin rash mostly on her back, some are maculopapular with raised border, some are miliary, no rash on extremities, not petechial rash, less suspicious for RMSF rash, possible drug rash, neurology notified for possible meds adjustment  Code Status: full  Family Communication: patient and family  Disposition Plan: transfer out of stepdown on 6/18 to med tele    Consultants:  neurology  Procedures:  EEG 6/7 , 6/19  Antibiotics:  vanc /zosyn from admission to 6/14   Objective: BP 147/58 mmHg  Pulse 54  Temp(Src) 97.5 F (36.4 C) (Oral)  Resp 16  Ht 5\' 3"  (1.6 m)  Wt 55.7 kg (122 lb 12.7 oz)  BMI 21.76 kg/m2  SpO2 98%  Intake/Output Summary (Last 24 hours) at 09/03/15 0843 Last data filed at 09/02/15 0900  Gross per 24 hour  Intake    360 ml  Output    850 ml  Net   -490 ml   Filed Weights   08/28/15 0408 08/28/15 1933 08/30/15 1203  Weight: 55.1 kg (121 lb 7.6 oz) 57.335 kg (126 lb 6.4 oz) 55.7 kg (122 lb 12.7 oz)    Exam:   General:  In deep sleep, not able to be awaken, maintaining airway  Cardiovascular: RRR  Respiratory: CTABL  Abdomen: Soft/ND/NT, positive BS  Musculoskeletal: No Edema  Neuro:  In deep sleep, not able to be awaken, maintaining airway   Data Reviewed: Basic Metabolic Panel:  Recent Labs Lab 08/27/15 0924  08/29/15 0934  08/30/15 0611 08/31/15 0535 09/01/15 0458 09/02/15 0516  NA 139  < > 134* 137 139 138 139  K 3.0*  < > 4.6 3.4* 4.2 3.7 3.6  CL 101  < > 101 107 109 108 106  CO2 29  < > 17* 21* 19* 21* 23  GLUCOSE 193*  < > 414* 167* 216* 185* 147*  BUN 10  < > 12 9 13 15 12   CREATININE 0.79  < > 1.27* 0.84 0.90 0.81 0.80  CALCIUM 8.3*  < > 8.8* 8.2* 8.6* 9.1 8.9  MG 1.5*  --  1.7  --  1.9  --  2.0  < > = values in this interval not displayed. Liver Function Tests:  Recent Labs Lab 08/27/15 0924 08/29/15 1631 08/31/15 0535  AST 84* 32 22  ALT 54 41 25  ALKPHOS 25* 28* 22*  BILITOT 0.7 1.7* 0.5  PROT 6.0* 6.0* 5.0*  ALBUMIN 3.2* 3.0* 2.3*   No results for input(s): LIPASE, AMYLASE in the last 168 hours.  Recent Labs Lab 08/27/15 0924  AMMONIA 14   CBC:  Recent Labs Lab 08/27/15 0924 08/28/15 0434 08/31/15 0535 09/01/15 0458  WBC 5.4 5.9 5.1 5.8  HGB 12.0 11.5* 10.9* 11.1*  HCT 35.2* 33.0* 32.6* 33.5*  MCV 89.8 88.7 86.9 86.8  PLT 224 234 229 243   Cardiac Enzymes:    Recent Labs Lab 08/27/15 0924  TROPONINI 0.03   BNP (last 3 results) No results for input(s): BNP in the last 8760 hours.  ProBNP (last 3 results) No results for input(s): PROBNP in the last 8760 hours.  CBG:  Recent Labs Lab 09/02/15 1257 09/02/15 1700 09/02/15 2045 09/03/15 0022 09/03/15 0402  GLUCAP 267* 269* 289* 198* 132*    Recent Results (from the past 240 hour(s))  Blood culture (routine x 2)     Status: None   Collection Time: 08/27/15  9:24 AM  Result Value Ref Range Status   Specimen Description BLOOD LEFT ANTECUBITAL  Final   Special Requests BOTTLES DRAWN AEROBIC ONLY 6CC  Final   Culture NO GROWTH 5 DAYS  Final   Report Status 09/01/2015 FINAL  Final  Blood culture (routine x 2)     Status: None   Collection Time: 08/27/15 11:28 AM  Result Value Ref Range Status   Specimen Description BLOOD LEFT ANTECUBITAL  Final   Special Requests BOTTLES DRAWN AEROBIC AND ANAEROBIC  [place]  Final   Culture NO GROWTH 5 DAYS  Final   Report Status 09/01/2015 FINAL  Final  MRSA PCR Screening     Status: None   Collection Time: 08/29/15  9:28 PM  Result Value Ref Range Status   MRSA by PCR NEGATIVE NEGATIVE Final    Comment:        The GeneXpert MRSA Assay (FDA approved for NASAL specimens only), is one component of a comprehensive MRSA colonization surveillance program. It is not intended to diagnose MRSA infection nor to guide or monitor treatment for MRSA infections.  Studies: No results found.  Scheduled Meds: . amLODipine  5 mg Oral Daily  . antiseptic oral rinse  7 mL Mouth Rinse q12n4p  . aspirin EC  81 mg Oral Daily  . chlorhexidine  15 mL Mouth Rinse BID  . cycloSPORINE  1 drop Both Eyes BID  . gabapentin  100 mg Oral QHS  . heparin  5,000 Units Subcutaneous Q8H  . insulin aspart  0-20 Units Subcutaneous Q4H  . insulin glargine  10 Units Subcutaneous QHS  . levETIRAcetam  500 mg Intravenous Q12H  . methylPREDNISolone (SOLU-MEDROL) injection  1,000 mg Intravenous Daily  . polyethylene glycol  17 g Oral Daily  . senna-docusate  2 tablet Oral QHS  . sodium chloride flush  3 mL Intravenous Q12H  . valproate sodium  750 mg Intravenous Q8H    Continuous Infusions:     Time spent: 43mins  Renlee Floor MD, PhD  [facility] Pager [phone]. If 7PM-7AM, please contact night-coverage at [email or web address], password [facility] 09/03/2015, 8:43 AM  LOS: 5 days

## 2015-09-03 NOTE — Progress Notes (Signed)
EEG Completed; Results Pending  

## 2015-09-03 NOTE — Progress Notes (Signed)
SLP Cancellation Note  Patient Details Name: Veronica Moses MRN: [id number] DOB: Sep 08, 1955   Cancelled treatment:       Reason Eval/Treat Not Completed: Fatigue/lethargy limiting ability to participate;Patient's level of consciousness; Therapist noted that pt had been made NPO since last being seen by ST.  Spoke with pt's RN who informed therapist that pt was made NPO due to lethargy s/p seizure.  RN requested that SLP defer follow up until pt is more alert.  SLP will continue to follow as appropriate    Emilio Math 09/03/2015, 4:28 PM

## 2015-09-03 NOTE — Progress Notes (Signed)
Subjective: Patient is awake and believes she is in [facility]. Nurse at bedside states she suddenly was incontinent of stool and urine but husband who has just left room did not note seizure activity.  EEG is coming now to do planned F/U EEG.   Exam: Filed Vitals:   09/03/15 0500 09/03/15 0905  BP: 147/58 147/59  Pulse: 54 66  Temp: 97.5 F (36.4 C) 97.7 F (36.5 C)  Resp: 16 16     Gen: In bed, NAD MS: Alert able to follow commands but believes she is at [place] to follow 2 step commands. Able to name fingers and show me her thumb and small finger.  CN: 2-12 intact.  Motor: MAEW Sensory: intact throughout DTR: 2+  Pertinent Labs/Diagnostics: VPA level pending EEG pending RMSF, IGG, IFA Positive (08/21/2015) RMSF IgG, IgM Positive  (08/21/2015)  Etta Quill PA-C [facility] [phone]  Impression:59 yo F with AMS and frequent partial seizures. There has been concern for autoimmune encephalopathy and she has received empiric steroids. Seizures have now been controlled on Keppra 500 mg BID and Depakote 750 mg BID but she is very sleepy. I suspect her sedation may be related to depakote as she has a low albumin and level of 118, so is likely VERY supratheraputic.   RMSF can cause seizures, but typically this is due to encephalitis or meningitis and therefore I would expect abnormal CSF. There is some question of previous episodes of unresponsiveness and therefore there is a possibility that she has systemic infection lowering seizure threshold and already predispose patient. Finally, I think that the consideration of autoimmune encephalitis is valid and she seemed to respond some to steroids.  She does have a maculopapular rash which can be result of Depakote therapy, therefore I would recommend stopping this and changing to another agent.  Recommend: 1) discontinue Depakote 2) overnight EEG 3) continue keppra 500mg  BID.  4) Start Vimpat 100  mg   Roland Rack, MD [facility] [phone]  If 7pm- 7am, please page neurology on call as listed in [place].  09/03/2015, 10:24 AM

## 2015-09-03 NOTE — Progress Notes (Signed)
LTM day 1 started; all leads under 5 kohms and glued. Nurse and family educated on push button. Dr Deborah Chalk notified.

## 2015-09-04 LAB — CBC
HCT: 35.8 % — ABNORMAL LOW (ref 36.0–46.0)
HEMOGLOBIN: 11.8 g/dL — AB (ref 12.0–15.0)
MCH: 29.6 pg (ref 26.0–34.0)
MCHC: 33 g/dL (ref 30.0–36.0)
MCV: 89.7 fL (ref 78.0–100.0)
Platelets: 190 10*3/uL (ref 150–400)
RBC: 3.99 MIL/uL (ref 3.87–5.11)
RDW: 13.6 % (ref 11.5–15.5)
WBC: 6.2 10*3/uL (ref 4.0–10.5)

## 2015-09-04 LAB — VALPROIC ACID LEVEL: VALPROIC ACID LVL: 76 ug/mL (ref 50.0–100.0)

## 2015-09-04 LAB — BASIC METABOLIC PANEL
Anion gap: 4 — ABNORMAL LOW (ref 5–15)
BUN: 13 mg/dL (ref 6–20)
CHLORIDE: 104 mmol/L (ref 101–111)
CO2: 28 mmol/L (ref 22–32)
CREATININE: 0.89 mg/dL (ref 0.44–1.00)
Calcium: 8.6 mg/dL — ABNORMAL LOW (ref 8.9–10.3)
GFR calc non Af Amer: >60 mL/min (ref >60)
Glucose, Bld: 145 mg/dL — ABNORMAL HIGH (ref 65–99)
POTASSIUM: 3.9 mmol/L (ref 3.5–5.1)
SODIUM: 136 mmol/L (ref 135–145)

## 2015-09-04 LAB — GLUCOSE, CAPILLARY
GLUCOSE-CAPILLARY: 72 mg/dL (ref 65–99)
GLUCOSE-CAPILLARY: 167 mg/dL — AB (ref 65–99)
Glucose-Capillary: 127 mg/dL — ABNORMAL HIGH (ref 65–99)
Glucose-Capillary: 127 mg/dL — ABNORMAL HIGH (ref 65–99)
Glucose-Capillary: 280 mg/dL — ABNORMAL HIGH (ref 65–99)

## 2015-09-04 LAB — SEDIMENTATION RATE: SED RATE: 1 mm/h (ref 0–22)

## 2015-09-04 LAB — MAGNESIUM: MAGNESIUM: 2.1 mg/dL (ref 1.7–2.4)

## 2015-09-04 LAB — C-REACTIVE PROTEIN: CRP: <0.5 mg/dL (ref <1.0)

## 2015-09-04 MED ORDER — SODIUM CHLORIDE 0.9 % IV SOLN
1500.0000 mg | Freq: Two times a day (BID) | INTRAVENOUS | Status: DC
  Administered 2015-09-04 – 2015-09-05 (×3): 1500 mg via INTRAVENOUS
  Filled 2015-09-04 (×3): qty 15

## 2015-09-04 MED ORDER — SODIUM CHLORIDE 0.9 % IV SOLN
500.0000 mg | Freq: Once | INTRAVENOUS | Status: AC
  Administered 2015-09-04: 500 mg via INTRAVENOUS
  Filled 2015-09-04: qty 5

## 2015-09-04 MED ORDER — LEVETIRACETAM 500 MG/5ML IV SOLN
1000.0000 mg | Freq: Two times a day (BID) | INTRAVENOUS | Status: DC
  Filled 2015-09-04: qty 10

## 2015-09-04 MED ORDER — SODIUM CHLORIDE 0.9 % IV SOLN
100.0000 mg | Freq: Once | INTRAVENOUS | Status: AC
  Administered 2015-09-04: 100 mg via INTRAVENOUS
  Filled 2015-09-04: qty 10

## 2015-09-04 MED ORDER — SODIUM CHLORIDE 0.9 % IV SOLN
200.0000 mg | Freq: Two times a day (BID) | INTRAVENOUS | Status: DC
  Administered 2015-09-04 – 2015-09-05 (×2): 200 mg via INTRAVENOUS
  Filled 2015-09-04 (×5): qty 20

## 2015-09-04 NOTE — Progress Notes (Signed)
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 Consumption of thin liquids via straw and mastication of solid texture did not reveal deficits. Pt was on Dys 3 texture and made NPO yesterday due to significant lethargy. Recommend and received order from MD for regular texture, thin liquids. Eat when alert, small sips, straw sips allowed, pills whole in applesauce. ST will follow for safety and efficiency.   HPI HPI: 60 y.o. female with hx of COPD, DM, status epilepticus, UTI admitted with lethargy and found to have urinary incontinence. Her CXR showed streaky lingular and left basilar airspace disease could be due to atelectasis or pneumonia. CT showed nothing acute. Per chart she was felt to have another seizure, and possibly had aspiration. No prior ST notes.      SLP Plan  Continue with current plan of care     Recommendations  Diet recommendations: Regular;Thin liquid Liquids provided via: Cup;Straw Medication Administration: Whole meds with puree Supervision: Patient able to self feed;Intermittent supervision to cue for compensatory strategies Compensations: Slow rate;Small sips/bites Postural Changes and/or Swallow Maneuvers: Seated upright 90 degrees             Oral Care Recommendations: Oral care BID Follow up Recommendations:  (TBD) Plan: Continue with current plan of care     GO                Houston Siren 09/04/2015, 11:18 AM  Orbie Pyo Colvin Caroli.Ed [employer] [phone]

## 2015-09-04 NOTE — Procedures (Signed)
  Electroencephalogram report- LTM   Ordering Physician : Dr Leonel Ramsay  EEG number: LTM 17  Data acquisition: 10-20 electrode placement.  Additional T1, T2, and EKG electrodes; 26 channel digital referential acquisition reformatted to 18 channel/7 channel coronal bipolar   Beginning time: 6/19 17 at 16 34 23  Ending time: 09/04/15 at 07 30 21 am  Day of study: day 1   This 16 hours of intensive EEG monitoring with simultaneous video monitoring was performed for this patient with confusion to rule out clinical subclinical electrographic seizures to explain her symptoms. Medications: Depakote was changed to vimpat  There was no pushbutton activations events during this recording.   Waking background activities were marked by 7  cps posterior dominant symmetric synchronized alpha rhythm which tends to attenuate with eyes opening. Superimposed there is near continuous left anterior frontal sharp waves and spikes present throughout the recording with maximum negativity at Fp1.  Frequently throughout the recording this is a left anterior frontal spikes become rhythmic synchronized and involvement and occur in 10-20 seconds trains and consistent with electrographic seizures.  There were no obvious clinical accompaniment to this frequent electrographic seizures at least not evident on video provided.  Clinical interpretation: This 16 hours of intensive EEG monitoring with simultaneous monitoring recorded a continues left anterior frontal sharply since spikes as well as frequent electrographic seizures arising from left anterior frontal cortex.  There was no obvious clinical accompaniment to these seizures.  Clinical correlation is advised.  These  findings were discussed with ordering team.

## 2015-09-04 NOTE — Progress Notes (Signed)
Subjective: Appears improved to family. She had frequent left sided discharges, but appeared to improve with   Exam: Filed Vitals:   09/04/15 0109 09/04/15 0642  BP: 118/50 133/53  Pulse: 52 48  Temp: 97.9 F (36.6 C) 98.1 F (36.7 C)  Resp: 16 16     Gen: In bed, NAD MS: More awake, this morning, able to tell me # of quarters in a dollar, but not $2.75. Able to identify her sister.  CN: 2-12 intact.  Motor: MAEW Sensory: intact throughout DTR: 2+  Pertinent Labs/Diagnostics: VPA 42  Impression:59 yo F with AMS and frequent partial seizures. There has been concern for autoimmune encephalopathy and she has received empiric steroids with some possible improvement. Seizures have now been controlled on Keppra 500 mg BID and Depakote 750 mg BID but she is very sleepy. I suspect her sedation was related to supratheraputic depakote, but she is still encephalopathic.   She does have a maculopapular rash which can be result of Depakote therapy, therefore I have changed her to vimpat.  Recommend: 1) increase keppra to 1000mg  BID.  2) continue Vimpat 100 mg   Roland Rack, MD [facility] [phone]  If 7pm- 7am, please page neurology on call as listed in [place].  09/04/2015, 8:51 AM

## 2015-09-04 NOTE — Progress Notes (Signed)
vLTM EEG running. EEG maint complete. No skin breakdown at electrode site FP1 and FP2/ rewrapped head

## 2015-09-04 NOTE — Progress Notes (Addendum)
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  6/20 under 16hr EEG monitoring , more alert and interactive, oriented to person, know it is 2017, states she is at [place] [facility]  Husband in room  Assessment/Plan: Principal Problem:   Seizure ([place]) Active Problems:   Diabetes ([place])   COPD (chronic obstructive pulmonary disease) ([place])   Protein-calorie malnutrition, severe ([place])   HCAP (healthcare-associated pneumonia)   Psychosocial stressors   Acute encephalopathy   Insulin dependent diabetes mellitus ([place])   Hypokalemia  Seizures:  With recent hospitalization from 6/5 to 6/10 at [facility] , she was intubation for airway protection during hospitalization at [facility].  initial concern for pseudoseizure due to family reported recent stressors at home, however EEG + focal seizure, meds adjusted, neurology and psychiatry input appreciated.she was transferred to stepdown on 6/15 due to frequent seizure episodes, and transferred out on 6/18 with clinical improvement  She is now more alert and passed swallow eval on 6/20, started diet, continue seizure precaution and aspiration precaution,    meds adjustment per neurology: finished pulse dose of steroids x 5days  for possible autoimmune etiology,  was on keppra and depakote, but due to new onset of rash on 6/19 and supratherapeutic volproic acid level, meds changed to keppra and vimpat on 6/20   Positive RMSF titer (both  IgM and IgG), test was done at [facility] during last hospitalization:  She is started on  iv doxycycline  due to NPO, transition to oral doxycycline once able to take oral meds, plan to treat for total of 21days with doxycycline.  Insulin dependent dm2,  recent a1c 5.3, with hypoglycemic episodes , lowest 36, requiring d5 infusion briefly, current blood sugar elevated, on ssi She was treated with lantus 10units qhs  Due to steroids use,   lantus d/ed on 6/19 due to npo and finishing steroids Continue adjust insulin dose   aki: cr 1.27, baseline 0.7, ua no infection, adequate urine output, continue hydration, cr normalized. D/c ivf once able to take adequate oral intake in 1-2 days  Aspiration pna? No cough, no fever, no hypoxia, no leukocytosis, abx that was started from [facility] was d/ced once arrived to [place].   observe off abx  Hypokalemia: replace k,  Mag 1.9 Start ns with k on 6/19 due to npo.  HTN: steroid induced? Better,  iv prn hydralazine for sbp>180  Suppressed tsh, free t4 wnl.   Skin rash started her back on 6/19, spreading to abdomen on 6/20, Does not itch, some are maculopapular with raised border, some are miliary, no rash on extremities, not petechial rash, not consistent with RMSF rash, possible drug rash, neurology notified , depakote d/ced,  Continue monitor  Code Status: full  Family Communication: patient and family  Disposition Plan: transfer out of stepdown on 6/18 to med tele    Consultants:  neurology  Procedures:  EEG 6/7 , 6/19  Antibiotics:  vanc /  zosyn from admission to 6/14   Objective: BP 133/53 mmHg  Pulse 48  Temp(Src) 98.1 F (36.7 C) (Oral)  Resp 16  Ht 5\' 3"  (1.6 m)  Wt 55.7 kg (122 lb 12.7 oz)  BMI 21.76 kg/m2  SpO2 96% No intake or output data in the 24 hours ending 09/04/15 0922 Filed Weights   08/28/15 0408 08/28/15 1933 08/30/15 1203  Weight: 55.1 kg (121 lb 7.6 oz) 57.335 kg (126 lb 6.4 oz) 55.7 kg (122 lb 12.7  oz)    Exam:   General:  More alert and interactive  Cardiovascular: RRR  Respiratory: CTABL  Abdomen: Soft/ND/NT, positive BS  Musculoskeletal: No Edema  Neuro:  Alert, no focal deficit,   Data Reviewed: Basic Metabolic Panel:  Recent Labs Lab 08/29/15 0934  08/31/15 0535 09/01/15 0458 09/02/15 0516 09/03/15 0801 09/04/15  NA 134*  < > 139 138 139 137 136  K 4.6  < > 4.2 3.7 3.6 3.6 3.9  CL 101  < > 109 108 106 101 104  CO2 17*  < > 19* 21* 23 29 28   GLUCOSE 414*  < > 216* 185* 147* 108* 145*  BUN 12  < > 13 15 12 11 13   CREATININE 1.27*  < > 0.90 0.81 0.80 0.79 0.89  CALCIUM 8.8*  < > 8.6* 9.1 8.9 9.0 8.6*  MG 1.7  --  1.9  --  2.0  --  2.1  < > = values in this interval not displayed. Liver Function Tests:  Recent Labs Lab 08/29/15 1631 08/31/15 0535  AST 32 22  ALT 41 25  ALKPHOS 28* 22*  BILITOT 1.7* 0.5  PROT 6.0* 5.0*  ALBUMIN 3.0* 2.3*   No results for input(s): LIPASE, AMYLASE in the last 168 hours. No results for input(s): AMMONIA in the last 168 hours. CBC:  Recent Labs Lab 08/31/15 0535 09/01/15 0458 09/04/15  WBC 5.1 5.8 6.2  HGB 10.9* 11.1* 11.8*  HCT 32.6* 33.5* 35.8*  MCV 86.9 86.8 89.7  PLT 229 243 190   Cardiac Enzymes:   No results for input(s): CKTOTAL, CKMB, CKMBINDEX, TROPONINI in the last 168 hours. BNP (last 3 results) No results for input(s): BNP in the last 8760 hours.  ProBNP (last 3 results) No results for input(s): PROBNP in the last 8760 hours.  CBG:  Recent Labs Lab 09/03/15 1633 09/03/15 1951 09/03/15 2340 09/04/15 0348 09/04/15 0740  GLUCAP 294* 290* 163* 127* 72    Recent Results (from the past 240 hour(s))  Blood culture (routine x 2)     Status: None   Collection Time: 08/27/15  9:24 AM  Result Value Ref Range Status   Specimen Description BLOOD LEFT ANTECUBITAL  Final   Special Requests BOTTLES DRAWN AEROBIC ONLY 6CC  Final   Culture NO GROWTH 5 DAYS  Final   Report Status 09/01/2015  FINAL  Final  Blood culture (routine x 2)     Status: None   Collection Time: 08/27/15 11:28 AM  Result Value Ref Range Status   Specimen Description BLOOD LEFT ANTECUBITAL  Final   Special Requests BOTTLES DRAWN AEROBIC AND ANAEROBIC [place]  Final   Culture NO GROWTH 5 DAYS  Final   Report Status 09/01/2015 FINAL  Final  MRSA PCR Screening     Status: None   Collection Time: 08/29/15  9:28 PM  Result Value Ref Range Status   MRSA by PCR NEGATIVE NEGATIVE Final    Comment:  The GeneXpert MRSA Assay (FDA approved for NASAL specimens only), is one component of a comprehensive MRSA colonization surveillance program. It is not intended to diagnose MRSA infection nor to guide or monitor treatment for MRSA infections.      Studies: No results found.  Scheduled Meds: . antiseptic oral rinse  7 mL Mouth Rinse q12n4p  . aspirin EC  81 mg Oral Daily  . chlorhexidine  15 mL Mouth Rinse BID  . cycloSPORINE  1 drop Both Eyes BID  . doxycycline (VIBRAMYCIN) IV  100 mg Intravenous Q12H  . gabapentin  100 mg Oral QHS  . heparin  5,000 Units Subcutaneous Q8H  . insulin aspart  0-20 Units Subcutaneous Q4H  . lacosamide (VIMPAT) IV  100 mg Intravenous Q12H  . levETIRAcetam  1,000 mg Intravenous Q12H  . levETIRAcetam  500 mg Intravenous Once  . sodium chloride flush  3 mL Intravenous Q12H    Continuous Infusions: . 0.9 % NaCl with KCl 20 mEq / L 75 mL/hr at 09/04/15 [id number]     Time spent: 41mins  Axavier Pressley MD, PhD  [facility] Pager [phone]. If 7PM-7AM, please contact night-coverage at [email or web address], password [facility] 09/04/2015, 9:22 AM  LOS: 6 days

## 2015-09-05 ENCOUNTER — Inpatient Hospital Stay (HOSPITAL_COMMUNITY): Payer: Medicaid Other

## 2015-09-05 DIAGNOSIS — A770 Spotted fever due to Rickettsia rickettsii: Secondary | ICD-10-CM

## 2015-09-05 DIAGNOSIS — G9340 Encephalopathy, unspecified: Secondary | ICD-10-CM

## 2015-09-05 DIAGNOSIS — R21 Rash and other nonspecific skin eruption: Secondary | ICD-10-CM

## 2015-09-05 LAB — CBC WITH DIFFERENTIAL/PLATELET
BASOS ABS: 0 10*3/uL (ref 0.0–0.1)
Basophils Relative: 0 %
EOS ABS: 0 10*3/uL (ref 0.0–0.7)
EOS PCT: 0 %
HCT: 35.9 % — ABNORMAL LOW (ref 36.0–46.0)
Hemoglobin: 11.9 g/dL — ABNORMAL LOW (ref 12.0–15.0)
Lymphocytes Relative: 8 %
Lymphs Abs: 1.1 10*3/uL (ref 0.7–4.0)
MCH: 29.3 pg (ref 26.0–34.0)
MCHC: 33.1 g/dL (ref 30.0–36.0)
MCV: 88.4 fL (ref 78.0–100.0)
MONO ABS: 0.6 10*3/uL (ref 0.1–1.0)
Monocytes Relative: 5 %
Neutro Abs: 11.9 10*3/uL — ABNORMAL HIGH (ref 1.7–7.7)
Neutrophils Relative %: 87 %
PLATELETS: 207 10*3/uL (ref 150–400)
RBC: 4.06 MIL/uL (ref 3.87–5.11)
RDW: 13.9 % (ref 11.5–15.5)
WBC: 13.6 10*3/uL — ABNORMAL HIGH (ref 4.0–10.5)

## 2015-09-05 LAB — BASIC METABOLIC PANEL
Anion gap: 11 (ref 5–15)
BUN: 12 mg/dL (ref 6–20)
CALCIUM: 8.7 mg/dL — AB (ref 8.9–10.3)
CHLORIDE: 99 mmol/L — AB (ref 101–111)
CO2: 25 mmol/L (ref 22–32)
CREATININE: 0.95 mg/dL (ref 0.44–1.00)
GFR calc Af Amer: >60 mL/min (ref >60)
Glucose, Bld: 312 mg/dL — ABNORMAL HIGH (ref 65–99)
Potassium: 4.4 mmol/L (ref 3.5–5.1)
SODIUM: 135 mmol/L (ref 135–145)

## 2015-09-05 LAB — GLUCOSE, CAPILLARY
GLUCOSE-CAPILLARY: 109 mg/dL — AB (ref 65–99)
GLUCOSE-CAPILLARY: 238 mg/dL — AB (ref 65–99)
Glucose-Capillary: 343 mg/dL — ABNORMAL HIGH (ref 65–99)
Glucose-Capillary: 167 mg/dL — ABNORMAL HIGH (ref 65–99)
Glucose-Capillary: 305 mg/dL — ABNORMAL HIGH (ref 65–99)

## 2015-09-05 LAB — MAGNESIUM: MAGNESIUM: 1.7 mg/dL (ref 1.7–2.4)

## 2015-09-05 MED ORDER — FOSPHENYTOIN SODIUM 500 MG PE/10ML IJ SOLN
15.0000 mg/kg | Freq: Once | INTRAMUSCULAR | Status: AC
  Administered 2015-09-05: 835.5 mg via INTRAVENOUS
  Filled 2015-09-05: qty 16.71

## 2015-09-05 MED ORDER — INSULIN ASPART 100 UNIT/ML ~~LOC~~ SOLN
0.0000 [IU] | SUBCUTANEOUS | Status: DC
  Administered 2015-09-05: 11 [IU] via SUBCUTANEOUS
  Administered 2015-09-05: 5 [IU] via SUBCUTANEOUS

## 2015-09-05 MED ORDER — ONDANSETRON HCL 4 MG/2ML IJ SOLN
4.0000 mg | Freq: Four times a day (QID) | INTRAMUSCULAR | Status: DC | PRN
  Administered 2015-09-05: 4 mg via INTRAVENOUS
  Filled 2015-09-05: qty 2

## 2015-09-05 MED ORDER — CAMPHOR-MENTHOL 0.5-0.5 % EX LOTN
TOPICAL_LOTION | CUTANEOUS | Status: DC | PRN
  Administered 2015-09-05: 18:00:00 via TOPICAL
  Filled 2015-09-05: qty 222

## 2015-09-05 MED ORDER — PHENYTOIN SODIUM EXTENDED 100 MG PO CAPS
100.0000 mg | ORAL_CAPSULE | Freq: Three times a day (TID) | ORAL | Status: DC
  Administered 2015-09-05 (×2): 100 mg via ORAL
  Filled 2015-09-05 (×2): qty 1

## 2015-09-05 NOTE — Procedures (Signed)
  Electroencephalogram report- LTM   Ordering Physician : Dr Leonel Ramsay  EEG number: LTM 17  Data acquisition: 10-20 electrode placement.  Additional T1, T2, and EKG electrodes; 26 channel digital referential acquisition reformatted to 18 channel/7 channel coronal bipolar   Beginning time: 6/19 /17 at 16 34 23  Ending time: 09/05/15 at 07 34  22 am  Day of study: day 1, day 2   This intensive EEG monitoring with simultaneous video monitoring was performed for this patient with confusion to rule out clinical subclinical electrographic seizures to explain her symptoms. Medications:  vimpat  There was no pushbutton activations events during this recording.   Day 1 : Waking background activities were marked by 7  cps posterior dominant symmetric synchronized alpha rhythm which tends to attenuate with eyes opening. Superimposed there is near continuous left anterior frontal sharp waves and spikes present throughout the recording with maximum negativity at Fp1.  Frequently throughout the recording this is a left anterior frontal spikes become rhythmic synchronized and involvement and occur in 10-20 seconds trains and consistent with electrographic seizures.  There were no obvious clinical accompaniment to this frequent electrographic seizures at least not evident on video provided.  Day 2:  Waking background activities were marked by 8  cps posterior dominant symmetric synchronized alpha rhythm which tends to attenuate with eyes opening. Superimposed there is near continuous left anterior frontal sharp waves and spikes present   with maximum negativity at Fp1 as well as  electrographic seizures were present during the first half of the recording.  During the second half of the recording there is a gradual resolution of subclinical electrographic seizures and left anterior frontal spikes and sharp waves become significantly less frequent.  At times however intermittently left anterior frontal  spikes present at times short 2-4 seconds runs.  However this pattern did not demonstrates evolving  features and very short in duration, and  would not meet criteria for subclinical seizures.  Clinical interpretation: This ntensive EEG monitoring with simultaneous monitoring demonstrating improvement during second day of monitoring as there is a gradual resolution of subclinical electrographic seizures and left frontopolar spikes and sharp waves become less prominent.  However there is still significant cortical irritability in the left anterior frontal cortex.  Please see above discussion. Clinical correlation is advised

## 2015-09-05 NOTE — Progress Notes (Signed)
LTM EEG discontinued.  Pt being transferred to another facility.

## 2015-09-05 NOTE — Progress Notes (Signed)
Subjective: Was still having seizures on EEG, increased keppra and vimpat to maximum doses. Clinically, she seems better this AM, though still with some confusion.   Exam: Filed Vitals:   09/05/15 0237 09/05/15 0606  BP: 154/62 145/59  Pulse: 79 86  Temp: 98.8 F (37.1 C) 100.5 F (38.1 C)  Resp: 16 16   Gen: In bed, NAD Resp: non-labored breathing, no acute distress Abd: soft, nt  Neuro: MS: awake, alert, able to give some history today. Answers are more spontaneous this morning. Still unable to perform simple math.  CN: VFF, EOMI Motor: MAEW Sensory:intact to LT  Pertinent Labs: Elevated glucose.   Impression: 60 yo F with AMS and frequent partial seizures. There has been concern for autoimmune encephalopathy and she has received empiric steroids with some possible improvement. She developed a rash on depakote and therefore was changed to vimpat. She clinically appears to have improved, but conitnues to have frequent left frontal epileptiform activity.   She has not had a definite response to IV steroids, and I would not favor further immunosuppression without clearer evidence. Significance of RMSF antibodies I feel is unclear with normal CSF, as this typically causes encephalitis if it is going to cause seizures. Also, with episodes of unresponsiveness(though not clearly seizure) prior to this, unclear if she had some seizure predisposition prior to this.   Recommendations: 1) Add dilantin today, will give 15 PE/kg cerebyx load followed by dilantin 100mg  TID 2) continue keppra 1500mg  BId 3) continue vimpat 200mg  BID 4) autoimmune epilepsy panel pending.   Roland Rack, MD [facility] [phone]  If 7pm- 7am, please page neurology on call as listed in [place].

## 2015-09-05 NOTE — Plan of Care (Signed)
RN, Webb Silversmith, paged this NP secondary to pt having a bed at [facility], but paperwork not completed. Family was not happy with the prospect of pt staying here another night. Webb Silversmith touched base several times with [facility], and they agreed to take the pt without a d/c summary. EMTALA completed. Webb Silversmith to call for transport. Pt will be taken to [place] with accepting MD, Elby Showers. VSS-BP 107-140s. HR 80-90s. RR 18. O2 sats upper 90s on room air.  Clance Boll, NP [facility]

## 2015-09-05 NOTE — Progress Notes (Signed)
Speech Language Pathology Treatment: Dysphagia  Patient Details Name: SUESAN MOHRMANN MRN: [id number] DOB: December 21, 1955 Today's Date: 09/05/2015 Time: 1050-1100 SLP Time Calculation (min) (ACUTE ONLY): 10 min  Assessment / Plan / Recommendation Clinical Impression  Dysphagia treatment provided for diet tolerance check. Pt showed no s/s of aspiration with multiple trials of thin liquids by straw. Pt with low appetite today but did consume 1 bite of regular solid without overt difficulty. Husband reports that pt "not having a good day today"- did not eat breakfast but consumed meals yesterday without difficulty. Current recommendations of regular diet/ thin liquids appear to be appropriate as long as pt is alert for meals. Continue intermittent supervision to ensure pt is upright for meals and having small bites/ sips. SLP will sign off at this time; please re-consult if needs arise.   HPI HPI: 60 y.o. female with hx of COPD, DM, status epilepticus, UTI admitted with lethargy and found to have urinary incontinence. Her CXR showed streaky lingular and left basilar airspace disease could be due to atelectasis or pneumonia. CT showed nothing acute. Per chart she was felt to have another seizure, and possibly had aspiration. No prior ST notes.      SLP Plan  All goals met     Recommendations  Diet recommendations: Regular;Thin liquid Liquids provided via: Cup;Straw Medication Administration: Whole meds with puree Supervision: Patient able to self feed;Intermittent supervision to cue for compensatory strategies Compensations: Slow rate;Small sips/bites Postural Changes and/or Swallow Maneuvers: Seated upright 90 degrees             Oral Care Recommendations: Oral care BID Follow up Recommendations: None Plan: All goals met     GO                Kern Reap, MA, CCC-SLP 09/05/2015, 11:03 AM [id number]

## 2015-09-05 NOTE — Progress Notes (Signed)
Received call from [place] at [facility] transfer center.Stated received inadequate information from [facility] and that patient could transfer without completed discharge summary.Text paged Forrest Moron NP with this updated information.NP Baltazar Najjar to fill out EMTLA form.

## 2015-09-05 NOTE — Progress Notes (Signed)
Patient to transfer to [facility] tonight bed received on day shift and report called by day shift nurse Florene Route trying to get paperwork together noticed EMTLA form and discharge summary not complete.Paged on call NP Forrest Moron to complete forms.This nurse was told by Einar Pheasant from transfer center that patient could not come without completed forms and would loose bed if did not arrive tonight.

## 2015-09-05 NOTE — Consult Note (Signed)
[facility]  Total days of antibiotics 3         Day 3 doxycycline               Reason for Consult: interpreation of RMSF titers and significance to seizures    Referring Physician: tat  Principal Problem:   Seizure (HCC) Active Problems:   Diabetes (HCC)   COPD (chronic obstructive pulmonary disease) (HCC)   Protein-calorie malnutrition, severe (HCC)   HCAP (healthcare-associated pneumonia)   Psychosocial stressors   Acute encephalopathy   Insulin dependent diabetes mellitus (HCC)   Hypokalemia    HPI: Veronica Moses is a 60 y.o. female  With COPD, IDDM, who was recently admitted for new onset seizure on June 6th-June 10th, requiring intubation to protect airway due to status epilepticus.  She underwent MRI of brain that showed some scattered white matter disease,  As well as underwent LP that had no WBC, numerous RBC that suggested traumatic tap. HSV and aerobic cultures were negative.  Initially, her family reported to have fevers at home, denied any tick bite history. On admit her WBC was 14.5 with 70%N, Plt 271, mild aki of cr 1.39 (BL of 0.9), sodium of 133. She was initially placed on vanco/piptazo for sepsis, and discontinued at 48hr since cultures were negative. She was started also on acyclovir but HSV PCR was negative. She was started on low dose keppra at 500mg  bid and was discharged on June 10th. She was subsequently readmitted in 48hrs on 6/12 to [facility] for recurrent AMS and seizures with repeated tonic clonic seizure. Her anti epileptic regimen was escalated in order to control seizures. Since there was less likely suspicion for CNS infection causing seizure, she did undergo a trial of methylpred 1000mg  x 5days to see if suppress seizure with the though this maybe auto-immune related seizures. She had partial effect, last dose was yesterday, and was alert last night but no longer this morning, and LTM still showed evidence of seizure activity.   On  6/19 it is noted that she developed a macular papular rash initial on torso and back that is coalesced. It  Was thought to be drug rash, possibly due to depatoke thus this was discontinued. In addition to her rash, she has had intermittent fever, on 6/13 and 6/14 then again on 6/20, (she may have had temp suppression while being on solumedrol from 6/15-6/20.  In reviewing her labs, it appears that she had RMSF titers sent, where she has positive IgM ab and equivocal IgG (IFA) 1:64. She was started on doxycycline on afternoon of 6/19, after onset of rash. ID has been asked to weigh in whether she needs treatment based upon studies and clinical history.   Today the patient appears to be more encephalopathic per her family's report. They are very concerned regarding her prognosis since she is still having seizures. They are concerned that her rash has continue to evolve throughout whole body including face but sparing palms and soles.  Her family reports she had seizure as a child due to bouncehouse, possible head trauma, but none in the last 50 yrs    Past Medical History  Diagnosis Date  . COPD (chronic obstructive pulmonary disease) (HCC)   . Diabetes mellitus   . UTI (lower urinary tract infection)   . Seizures (HCC)     Allergies:  Allergies  Allergen Reactions  . Demerol Other (See Comments)    hallucinations  . Dilaudid [Hydromorphone Hcl]  Nausea And Vomiting    Current antibiotics:   MEDICATIONS: . antiseptic oral rinse  7 mL Mouth Rinse q12n4p  . aspirin EC  81 mg Oral Daily  . chlorhexidine  15 mL Mouth Rinse BID  . cycloSPORINE  1 drop Both Eyes BID  . gabapentin  100 mg Oral QHS  . heparin  5,000 Units Subcutaneous Q8H  . insulin aspart  0-15 Units Subcutaneous Q4H  . lacosamide (VIMPAT) IV  200 mg Intravenous Q12H  . levETIRAcetam  1,500 mg Intravenous Q12H  . phenytoin  100 mg Oral TID  . sodium chloride flush  3 mL Intravenous Q12H    Social History  Substance Use  Topics  . Smoking status: Former Smoker -- 1.00 packs/day for 26 years    Types: Cigarettes  . Smokeless tobacco: Former Systems developer    Quit date: 05/15/2012  . Alcohol Use: No    Family History  Problem Relation Age of Onset  . Colon cancer Neg Hx   . CAD      family history  . Diabetes Father   . Diabetes Brother     Review of Systems -  Unable to obtain since patient is encephalopathic  OBJECTIVE: Temp:  [98.3 F (36.8 C)-100.5 F (38.1 C)] 98.9 F (37.2 C) (06/21 1408) Pulse Rate:  [78-91] 91 (06/21 1408) Resp:  [16-18] 18 (06/21 1408) BP: (117-162)/(50-63) 117/50 mmHg (06/21 1408) SpO2:  [96 %-98 %] 98 % (06/21 1408) Physical Exam  Constitutional:  oriented to person.  appears well-developed and well-nourished. No distress.  HENT: [place]/AT, PERRLA, no scleral icterus. Left ear erythema and swelling Mouth/Throat: Oropharynx is clear and moist. No oropharyngeal exudate.  Cardiovascular: Normal rate, regular rhythm and normal heart sounds. Exam reveals no gallop and no friction rub.  No murmur heard.  Pulmonary/Chest: Effort normal and breath sounds normal. No respiratory distress.  has no wheezes.  Neck = supple, no nuchal rigidity Abdominal: Soft. Bowel sounds are normal.  exhibits no distension. There is no tenderness.  Lymphadenopathy: no cervical adenopathy. No axillary adenopathy Neurological: alert and oriented to person, place, and time.  Skin: Skin is warm and dry. Diffuse macular papular rash to arms, legs, coalesced into large erythamatous plaque on her back. She has large macules with raised edges on torso/abdomen/legs. No petechaie Psychiatric: + does not answer questions consistently   LABS: Results for orders placed or performed during the hospital encounter of 08/27/15 (from the past 48 hour(s))  Glucose, capillary     Status: Abnormal   Collection Time: 09/03/15  4:33 PM  Result Value Ref Range   Glucose-Capillary 294 (H) 65 - 99 mg/dL   Comment 1 Notify RN     Comment 2 Document in Chart   Glucose, capillary     Status: Abnormal   Collection Time: 09/03/15  7:51 PM  Result Value Ref Range   Glucose-Capillary 290 (H) 65 - 99 mg/dL  Glucose, capillary     Status: Abnormal   Collection Time: 09/03/15 11:40 PM  Result Value Ref Range   Glucose-Capillary 163 (H) 65 - 99 mg/dL  Valproic acid level     Status: None   Collection Time: 09/04/15 12:00 AM  Result Value Ref Range   Valproic Acid Lvl 76 50.0 - 100.0 ug/mL  CBC     Status: Abnormal   Collection Time: 09/04/15 12:00 AM  Result Value Ref Range   WBC 6.2 4.0 - 10.5 K/uL   RBC 3.99 3.87 - 5.11 MIL/uL  Hemoglobin 11.8 (L) 12.0 - 15.0 g/dL   HCT 95.0 (L) 93.2 - 67.1 %   MCV 89.7 78.0 - 100.0 fL   MCH 29.6 26.0 - 34.0 pg   MCHC 33.0 30.0 - 36.0 g/dL   RDW 24.5 80.9 - 98.3 %   Platelets 190 150 - 400 K/uL  Basic metabolic panel     Status: Abnormal   Collection Time: 09/04/15 12:00 AM  Result Value Ref Range   Sodium 136 135 - 145 mmol/L   Potassium 3.9 3.5 - 5.1 mmol/L   Chloride 104 101 - 111 mmol/L   CO2 28 22 - 32 mmol/L   Glucose, Bld 145 (H) 65 - 99 mg/dL   BUN 13 6 - 20 mg/dL   Creatinine, Ser 3.82 0.44 - 1.00 mg/dL   Calcium 8.6 (L) 8.9 - 10.3 mg/dL   GFR calc non Af Amer >60 >60 mL/min   GFR calc Af Amer >60 >60 mL/min    Comment: (NOTE) The eGFR has been calculated using the CKD EPI equation. This calculation has not been validated in all clinical situations. eGFR's persistently <60 mL/min signify possible Chronic Kidney Disease.    Anion gap 4 (L) 5 - 15  Magnesium     Status: None   Collection Time: 09/04/15 12:00 AM  Result Value Ref Range   Magnesium 2.1 1.7 - 2.4 mg/dL  Sedimentation rate     Status: None   Collection Time: 09/04/15 12:00 AM  Result Value Ref Range   Sed Rate 1 0 - 22 mm/hr  C-reactive protein     Status: None   Collection Time: 09/04/15 12:00 AM  Result Value Ref Range   CRP <0.5 <1.0 mg/dL  Glucose, capillary     Status: Abnormal    Collection Time: 09/04/15  3:48 AM  Result Value Ref Range   Glucose-Capillary 127 (H) 65 - 99 mg/dL  Glucose, capillary     Status: None   Collection Time: 09/04/15  7:40 AM  Result Value Ref Range   Glucose-Capillary 72 65 - 99 mg/dL  Glucose, capillary     Status: Abnormal   Collection Time: 09/04/15 11:08 AM  Result Value Ref Range   Glucose-Capillary 127 (H) 65 - 99 mg/dL  Glucose, capillary     Status: Abnormal   Collection Time: 09/04/15  4:43 PM  Result Value Ref Range   Glucose-Capillary 280 (H) 65 - 99 mg/dL  Glucose, capillary     Status: Abnormal   Collection Time: 09/04/15  9:56 PM  Result Value Ref Range   Glucose-Capillary 167 (H) 65 - 99 mg/dL   Comment 1 Notify RN    Comment 2 Document in Chart   Glucose, capillary     Status: Abnormal   Collection Time: 09/05/15  4:49 AM  Result Value Ref Range   Glucose-Capillary 343 (H) 65 - 99 mg/dL  Basic metabolic panel     Status: Abnormal   Collection Time: 09/05/15  5:19 AM  Result Value Ref Range   Sodium 135 135 - 145 mmol/L   Potassium 4.4 3.5 - 5.1 mmol/L   Chloride 99 (L) 101 - 111 mmol/L   CO2 25 22 - 32 mmol/L   Glucose, Bld 312 (H) 65 - 99 mg/dL   BUN 12 6 - 20 mg/dL   Creatinine, Ser 5.05 0.44 - 1.00 mg/dL   Calcium 8.7 (L) 8.9 - 10.3 mg/dL   GFR calc non Af Amer >60 >60 mL/min   GFR calc  Af Amer >60 >60 mL/min    Comment: (NOTE) The eGFR has been calculated using the CKD EPI equation. This calculation has not been validated in all clinical situations. eGFR's persistently <60 mL/min signify possible Chronic Kidney Disease.    Anion gap 11 5 - 15  Magnesium     Status: None   Collection Time: 09/05/15  5:19 AM  Result Value Ref Range   Magnesium 1.7 1.7 - 2.4 mg/dL  Glucose, capillary     Status: Abnormal   Collection Time: 09/05/15  7:55 AM  Result Value Ref Range   Glucose-Capillary 167 (H) 65 - 99 mg/dL  Glucose, capillary     Status: Abnormal   Collection Time: 09/05/15 12:24 PM  Result  Value Ref Range   Glucose-Capillary 109 (H) 65 - 99 mg/dL    MICRO: 6/21 blood cx pending 6/12 blood cx ngtd 6/6 csf cx negative. hsv pcr negative IMAGING: Mri on 6/8 scattered white matter changes   Assessment/Plan:  60yo F with refractory status epilepticus, intermittent fever, evolving rash with possibly recent RMSF exposure. The RMSF is not known to be associated with status epilepticus, more often with meningoencephalitis but would expect to have seen WBC on CSF on presentation back on 6/6. It is possible that she was exposed in the last 3 wk and now seroconverting. Her presentation of her rash is not classic for RMSF, especially with timeline since it was documented to have started on 6/18-6/19 but she has been essentially hospitalized since 6/6. That being said, we don't have good explanation for intermittent fevers or her rash, that does not appear c/w leukocytoclastic vasculitis.  - will repeat her RMSF titers to see if change from June 6th, if seeing increase in IgG titer, maybe worth treating for RMSF - continue on doxycycline '100mg'$  bid. i feel her rash had started prior to starting doxycycline, so this is not a drug rash due to doxycycline. The only alternative would be chlormphenicol which would have to be special order  Rash - recommend getting cbc with diff to see if there is eosinophilia that would explain allergic reaction, (though she has been on methylpred as the rash continue to evolve). Recommend to get skin biopsy and picture/telephone consultation with derm  Left ear lesion = unclear from excoriation. Can due mupirocin and nonadherence dressing to avoid constant manipulation. Interestingly, relapsing polychondritis could have this feature, which is auto-immune driven., also assoc with rash. 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 Recommend to check auto immune labs such as ana, anca, sssogren's - but her sed rate is normal.  Will discuss with Dr. Leonel Ramsay if she warrants going to ICU to being placed on continuous drips while on LTM to suppress seizure. She is day 10 of still poorly controlled seizure activity vs. Consider need to transfer to academic institution for neuro unit.   - recommend to repeating mri with contrast to see if any changes from prior study - may consider other interventions such as IVIG or cyclophosphomide if auto-immune is still part of differential.  Health maintenance - will check hiv and hcv and rpr  Anthonio Mizzell B. [facility] [phone]

## 2015-09-05 NOTE — Progress Notes (Signed)
PROGRESS NOTE  Veronica Moses [id number] DOB: 01/25/1956 DOA: 08/27/2015 PCP: Alonza Bogus, MD  Brief History:  60 year old female with a history of COPD, diabetes mellitus, and seizure disorder presented to [facility] due to lethargy and urinary incontinence. The patient was recently discharged from the hospital on 08/25/2015 after being intubated secondary to status epilepticus. The patient was discharged home on Keppra during that admission. At the time of admission, the patient was started on intravenous vancomycin and Zosyn because of concerns due to aspiration secondary to her seizure. Antibiotics were ultimately discontinued on 08/29/2015, and the patient was observed clinically.  The patient had remained afebrile and hemodynamically stable. Unfortunately, the patient continued to have seizure activity for which neurology has been consulted. Depacon was started on 08/29/15, but stopped on 6/19 due to concerns of rash. The patient was continued on Keppra.  Because of rash,depacon was discontinued, and the patient was started on Vimpat 6/20. The patient had actually had some clinical improvement on Keppra and Depacon. Unfortunately, the patient began having seizure activity again on keppra and depacon. In the interim, the patient received 5 days of high-dose methylprednisolone, 1 g daily for 5 days which finished on 09/03/2015.  Neurology was reconsulted.  CSF was sent off for vasculitis workup. Since that time, the patient has developed an increasing rash on her back and upper extremities. The patient was started on doxycycline on 09/03/2015 at 1400 hrs.  Around 1800 hours, a new rash was noted on the patient's back which has since spread to her trunk and extremities.  Doxycycline was stopped on 09/05/15.  Unfortunately, continues EEG monitoring continues to show left anterior frontal sharp waves and seizure activity. Dilantin was added on 09/05/2015.  Assessment/Plan: Uncontrolled  epileptic seizures/acute encephalopathy -Antiepileptic drugs per neurology -valproate level was actually supratherapeutic prior to its discontinuation -continue EEG monitoring -09/05/2015--case was discussed with neurology, Dr. Leonel Ramsay -MRI brain negative for abnormal cortical hyperintensity -contacted [facility] at [facility] bed available-->call back 6/22 -contacted [facility]--placed on wait list  Positive RMSF serology -was on doxy 6/19-6/21 -d/c doxy 6/21--cause of rash  Fixed maculopapular rash -?drug reaction -d/c doxy -consult ID  COPD -stable on RA  DM2 -CBGs  -08/21/2015 hemoglobin A1c 5.3 -Continue NovoLog sliding scale  Euthyroid sick syndrome -TSH 0.192 -Free T4 0.97   Disposition Plan:   Not stable for discharge Family Communication:   Family updated at bedside--total time 90 min; >50% spent counseling and coordinating care   Consultants:  neurology  Code Status:  FULL / DNR  DVT Prophylaxis:  [place] Heparin / [place] Lovenox   Procedures: As Listed in Progress Note Above  Antibiotics: None    Subjective: Patient denies fevers, chills, headache, chest pain, dyspnea, nausea, vomiting, diarrhea, abdominal pain, dysuria   Objective: Filed Vitals:   09/05/15 0237 09/05/15 0606 09/05/15 0946 09/05/15 1247  BP: 154/62 145/59 139/54 162/63  Pulse: 79 86 84 87  Temp: 98.8 F (37.1 C) 100.5 F (38.1 C) 100 F (37.8 C) 99 F (37.2 C)  TempSrc: Oral Oral Oral Oral  Resp: 16 16 16 18   Height:      Weight:      SpO2: 96% 97% 97% 97%    Intake/Output Summary (Last 24 hours) at 09/05/15 1327 Last data filed at 09/05/15 0853  Gross per 24 hour  Intake      0 ml  Output      1 ml  Net     -1 ml   Weight change:  Exam:   General:  Pt is alert, follows commands appropriately, not in acute distress  HEENT: No icterus, No thrush, No neck mass, [place]/AT  Cardiovascular: RRR, S1/S2, no rubs, no gallops  Respiratory:  Diminished breath sounds. Bibasilar crackles. Good air movement.  Abdomen: Soft/+BS, non tender, non distended, no guarding  Extremities: No edema, No lymphangitis, No petechiae,  no synovitis; diffuse maculopapular rash on her trunk, back, arms, legs.   Data Reviewed: I have personally reviewed following labs and imaging studies Basic Metabolic Panel:  Recent Labs Lab 08/31/15 0535 09/01/15 0458 09/02/15 0516 09/03/15 0801 09/04/15 09/05/15 0519  NA 139 138 139 137 136 135  K 4.2 3.7 3.6 3.6 3.9 4.4  CL 109 108 106 101 104 99*  CO2 19* 21* 23 29 28 25   GLUCOSE 216* 185* 147* 108* 145* 312*  BUN 13 15 12 11 13 12   CREATININE 0.90 0.81 0.80 0.79 0.89 0.95  CALCIUM 8.6* 9.1 8.9 9.0 8.6* 8.7*  MG 1.9  --  2.0  --  2.1 1.7   Liver Function Tests:  Recent Labs Lab 08/29/15 1631 08/31/15 0535  AST 32 22  ALT 41 25  ALKPHOS 28* 22*  BILITOT 1.7* 0.5  PROT 6.0* 5.0*  ALBUMIN 3.0* 2.3*   No results for input(s): LIPASE, AMYLASE in the last 168 hours. No results for input(s): AMMONIA in the last 168 hours. Coagulation Profile: No results for input(s): INR, PROTIME in the last 168 hours. CBC:  Recent Labs Lab 08/31/15 0535 09/01/15 0458 09/04/15  WBC 5.1 5.8 6.2  HGB 10.9* 11.1* 11.8*  HCT 32.6* 33.5* 35.8*  MCV 86.9 86.8 89.7  PLT 229 243 190   Cardiac Enzymes: No results for input(s): CKTOTAL, CKMB, CKMBINDEX, TROPONINI in the last 168 hours. BNP: Invalid input(s): POCBNP CBG:  Recent Labs Lab 09/04/15 1643 09/04/15 2156 09/05/15 0449 09/05/15 0755 09/05/15 1224  GLUCAP 280* 167* 343* 167* 109*   HbA1C: No results for input(s): HGBA1C in the last 72 hours. Urine analysis:    Component Value Date/Time   COLORURINE YELLOW 08/27/2015 [place] 08/27/2015 1131   LABSPEC 1.010 08/27/2015 1131   PHURINE 6.5 08/27/2015 1131   GLUCOSEU NEGATIVE 08/27/2015 1131   HGBUR NEGATIVE 08/27/2015 1131   BILIRUBINUR NEGATIVE 08/27/2015 1131    KETONESUR NEGATIVE 08/27/2015 1131   PROTEINUR NEGATIVE 08/27/2015 1131   UROBILINOGEN 0.2 07/13/2014 1507   NITRITE NEGATIVE 08/27/2015 1131   LEUKOCYTESUR NEGATIVE 08/27/2015 1131   Sepsis Labs: @LABRCNTIP (procalcitonin:4,lacticidven:4) ) Recent Results (from the past 240 hour(s))  Blood culture (routine x 2)     Status: None   Collection Time: 08/27/15  9:24 AM  Result Value Ref Range Status   Specimen Description BLOOD LEFT ANTECUBITAL  Final   Special Requests BOTTLES DRAWN AEROBIC ONLY 6CC  Final   Culture NO GROWTH 5 DAYS  Final   Report Status 09/01/2015 FINAL  Final  Blood culture (routine x 2)     Status: None   Collection Time: 08/27/15 11:28 AM  Result Value Ref Range Status   Specimen Description BLOOD LEFT ANTECUBITAL  Final   Special Requests BOTTLES DRAWN AEROBIC AND ANAEROBIC [place]  Final   Culture NO GROWTH 5 DAYS  Final   Report Status 09/01/2015 FINAL  Final  MRSA PCR Screening     Status: None   Collection Time: 08/29/15  9:28 PM  Result Value Ref Range  Status   MRSA by PCR NEGATIVE NEGATIVE Final    Comment:        The GeneXpert MRSA Assay (FDA approved for NASAL specimens only), is one component of a comprehensive MRSA colonization surveillance program. It is not intended to diagnose MRSA infection nor to guide or monitor treatment for MRSA infections.      Scheduled Meds: . antiseptic oral rinse  7 mL Mouth Rinse q12n4p  . aspirin EC  81 mg Oral Daily  . chlorhexidine  15 mL Mouth Rinse BID  . cycloSPORINE  1 drop Both Eyes BID  . doxycycline (VIBRAMYCIN) IV  100 mg Intravenous Q12H  . gabapentin  100 mg Oral QHS  . heparin  5,000 Units Subcutaneous Q8H  . insulin aspart  0-20 Units Subcutaneous Q4H  . lacosamide (VIMPAT) IV  200 mg Intravenous Q12H  . levETIRAcetam  1,500 mg Intravenous Q12H  . phenytoin  100 mg Oral TID  . sodium chloride flush  3 mL Intravenous Q12H   Continuous Infusions: . 0.9 % NaCl with KCl 20 mEq / L 75 mL/hr  at 09/04/15 [id number]    Procedures/Studies: Dg Chest 2 View  08/27/2015  CLINICAL DATA:  Altered mental status. Crackles in the left lung base. EXAM: CHEST  2 VIEW COMPARISON:  Single-view of the chest 08/22/2015 and 08/20/2015. PA and lateral chest 05/21/2015. FINDINGS: Streaky airspace disease is seen in the lingula and left lung base. Minimal right basilar atelectasis is noted. Heart size is normal. No pneumothorax or pleural effusion. IMPRESSION: Streaky lingular and left basilar airspace disease could be due to atelectasis or pneumonia. Electronically Signed   By: Inge Rise M.D.   On: 08/27/2015 10:15   Ct Head Wo Contrast  08/27/2015  CLINICAL DATA:  Altered mental status for 1 day. History of seizures. EXAM: CT HEAD WITHOUT CONTRAST TECHNIQUE: Contiguous axial images were obtained from the base of the skull through the vertex without intravenous contrast. COMPARISON:  Head CT May 31, 2014 and brain MRI August 23, 2015 FINDINGS: The ventricles are normal in size and configuration. There is slight frontal atrophy bilaterally. There is no intracranial mass, hemorrhage, extra-axial fluid collection, or midline shift. The gray-white compartments appear normal. No acute infarct evident. The bony calvarium appears intact. Mastoids on the left are clear. The mastoids on the right are largely clear with several opacified inferior right mastoid air cells. No intraorbital lesions are evident in the visualized orbital regions. IMPRESSION: Mild frontal atrophy bilaterally. Ventricles normal in size and configuration. No intracranial mass, hemorrhage, or evidence of focal infarct. Mild inferior mastoid disease on the right. Mastoids elsewhere clear. Electronically Signed   By: Lowella Grip III M.D.   On: 08/27/2015 10:43   Ct Head Wo Contrast  08/20/2015  CLINICAL DATA:  Seizure today. EXAM: CT HEAD WITHOUT CONTRAST TECHNIQUE: Contiguous axial images were obtained from the base of the skull through the  vertex without intravenous contrast. COMPARISON:  05/31/2014 FINDINGS: A small left frontal scalp hematoma is noted but no underlying skull fracture. The ventricles are in the midline without mass effect or shift. They are normal in size and configuration. No extra-axial fluid collections are identified. No findings for hemispheric infarction an or intracranial hemorrhage. No mass lesion. The brainstem and cerebellum are grossly normal. IMPRESSION: No acute intracranial findings or skull fracture. No change since prior study. Electronically Signed   By: Marijo Sanes M.D.   On: 08/20/2015 18:42   Mr Jeri Cos [id number] Contrast  08/23/2015  CLINICAL DATA:  60 year old female with seizure, status epilepticus. Unresponsive. Initial encounter. EXAM: MRI HEAD WITHOUT AND WITH CONTRAST TECHNIQUE: Multiplanar, multiecho pulse sequences of the brain and surrounding structures were obtained without and with intravenous contrast. CONTRAST:  74mL MULTIHANCE GADOBENATE DIMEGLUMINE 529 MG/ML IV SOLN COMPARISON:  Head CTs without contrast 08/20/2015 and earlier. [facility] Brain MRI 03/06/2010. FINDINGS: Major intracranial vascular flow voids are stable and normal. Cerebral volume is stable and normal for age. No restricted diffusion or evidence of acute infarction. No midline shift, mass effect, evidence of mass lesion, ventriculomegaly, extra-axial collection or acute intracranial hemorrhage. Cervicomedullary junction and pituitary are within normal limits. [place] Cables and white matter signal throughout the brain appears stable and largely unremarkable for age; there are mild nonspecific white matter signal changes. No edema or acute signal abnormality in the hippocampal formations. The left hippocampal formation has a more vertical configuration chronically (better seen on series 9, image 17 in 2011). No cortical encephalomalacia or chronic cerebral blood products. No abnormal enhancement identified. No dural thickening. Mild  mastoid effusions are new, greater on the right. Negative nasopharynx. Otherwise negative visualized internal auditory structures. Trace ethmoid sinus mucosal thickening. Stable and negative orbit and scalp soft tissues. Negative visualized cervical spine. Bone marrow signal is stable and within normal limits. IMPRESSION: No acute intracranial abnormality. Stable since 2011 and largely unremarkable for age MRI appearance of the brain. Electronically Signed   By: Genevie Ann M.D.   On: 08/23/2015 10:12   US Carotid Bilateral  08/21/2015  CLINICAL DATA:  Seizures and altered mental status EXAM: BILATERAL CAROTID DUPLEX ULTRASOUND TECHNIQUE: [place] Cables scale imaging, color Doppler and duplex ultrasound were performed of bilateral carotid and vertebral arteries in the neck. COMPARISON:  None. FINDINGS: Criteria: Quantification of carotid stenosis is based on velocity parameters that correlate the residual internal carotid diameter with NASCET-based stenosis levels, using the diameter of the distal internal carotid lumen as the denominator for stenosis measurement. The following velocity measurements were obtained: RIGHT ICA:  148 cm/sec CCA:  [id number] cm/sec SYSTOLIC ICA/CCA RATIO:  1.2 DIASTOLIC ICA/CCA RATIO:  1.2 ECA:  101 cm/sec LEFT ICA:  159 cm/sec CCA:  [id number] cm/sec SYSTOLIC ICA/CCA RATIO:  1.2 DIASTOLIC ICA/CCA RATIO:  1.1 ECA:  135 cm/sec RIGHT CAROTID ARTERY: Moderate heterogeneous soft plaque in the bulb. Low resistance internal carotid Doppler pattern. RIGHT VERTEBRAL ARTERY:  Antegrade. LEFT CAROTID ARTERY: Moderate mixed plaque in the bulb. Low resistance internal carotid Doppler pattern. LEFT VERTEBRAL ARTERY:  Antegrade. IMPRESSION: 50-69% stenosis in the right and left internal carotid arteries. Electronically Signed   By: Marybelle Killings M.D.   On: 08/21/2015 10:37   Dg Chest Port 1 View  08/22/2015  CLINICAL DATA:  Intubated patient, respiratory failure, COPD, DKA, seizure disorder, former smoker. EXAM: PORTABLE  CHEST 1 VIEW COMPARISON:  Chest x-ray of August 21, 2015 FINDINGS: The lungs remain well-expanded. There is no infiltrate, pneumothorax, or pleural effusion. The heart is normal in size. The pulmonary vascularity is mildly prominent. The endotracheal tube tip lies 4.1 cm above the carina. The esophagogastric tube tip projects below the inferior margin of the image. The observed bony thorax exhibits no acute abnormality. IMPRESSION: Intubated patient with support tubes in reasonable position. COPD. No definite acute cardiopulmonary abnormality. Electronically Signed   By: Ilona Colley  [place] M.D.   On: 08/22/2015 07:50   Dg Chest Port 1 View  08/21/2015  CLINICAL DATA:  Respiratory failure EXAM: PORTABLE CHEST 1 VIEW COMPARISON:  Chest radiograph from one  day prior. FINDINGS: Endotracheal tube tip is 4.3 cm above the carina. Enteric tube enters stomach with the tip not seen on this image. Stable cardiomediastinal silhouette with normal heart size. No pneumothorax. No pleural effusion. Lungs appear clear, with no acute consolidative airspace disease and no pulmonary edema. IMPRESSION: Well-positioned endotracheal and enteric tubes. No active cardiopulmonary disease. Electronically Signed   By: Ilona Sorrel M.D.   On: 08/21/2015 09:08   Dg Chest Port 1 View  08/20/2015  CLINICAL DATA:  Check endotracheal tube placement EXAM: PORTABLE CHEST 1 VIEW COMPARISON:  05/21/2015 FINDINGS: Cardiac shadow is stable. An endotracheal tube is now seen 5.8 cm above the carina. A nasogastric catheter is seen within the stomach. The lungs are well-aerated without focal infiltrate or sizable effusion. No acute bony abnormality is seen. IMPRESSION: Tubes and lines in satisfactory position. No acute abnormality noted. Electronically Signed   By: Inez Catalina M.D.   On: 08/20/2015 18:18    Carnetta Losada, DO  [facility] Pager [phone]  If 7PM-7AM, please contact night-coverage [email or web address] Password [email or web address] 09/05/2015, 1:27 PM    LOS: 7 days

## 2015-09-06 DIAGNOSIS — A770 Spotted fever due to Rickettsia rickettsii: Secondary | ICD-10-CM | POA: Insufficient documentation

## 2015-09-06 DIAGNOSIS — R21 Rash and other nonspecific skin eruption: Secondary | ICD-10-CM | POA: Insufficient documentation

## 2015-09-06 NOTE — Progress Notes (Signed)
09/06/2015 0155 Received phone call from [place] asking for last MD progress notes on this patient since discharge summary not completed by Dr.Tat. Dr.Karmakar also asking that Dr.Tat send discharge summary in morning on this patient. This nurse will fax over last MD progress notes to Dr.Karmakar at [phone].

## 2015-09-06 NOTE — Discharge Summary (Signed)
Physician Discharge Summary  Veronica Moses [id number] DOB: 1955-08-06 DOA: 08/27/2015  PCP: Alonza Bogus, MD  Admit date: 08/27/2015 Discharge date: 09/05/15  Admitted From: Home Disposition: Transfer to [facility]   Discharge Condition:Stable CODE STATUS:FULL Diet recommendation: Heart Healthy   Brief/Interim Summary: 60 year old female with a history of COPD, diabetes mellitus, and seizure disorder presented to [facility] due to lethargy and urinary incontinence. The patient was recently discharged from the hospital on 08/25/2015 after being intubated secondary to status epilepticus. The patient was discharged home on Keppra during that admission. At the time of admission, the patient was started on intravenous vancomycin and Zosyn because of concerns due to aspiration secondary to her seizure. Antibiotics were ultimately discontinued on 08/29/2015, and the patient was observed clinically. The patient had remained afebrile and hemodynamically stable. Unfortunately, the patient continued to have seizure activity for which neurology has been consulted. Depacon was started on 08/29/15, but stopped on 6/19 due to concerns of rash. The patient had actually had some clinical improvement on Keppra and Depacon (for 2-3 days) prior to development of rash. The patient was continued on Keppra. Because of rash,depacon was discontinued, and the patient was started on Vimpat 09/04/15.  Unfortunately, the patient began having clinical seizure activity again on keppra and depacon. Neurology was reconsulted.There was concern for CNS vasculitis and CSF was sent for autoantibodies. In the interim, Neurology started pt on 5 days of high-dose methylprednisolone, 1 g daily for 5 days which finished on 09/03/2015. CSF was sent off for vasculitis workup. Since that time, the patient has developed an increasing rash on her back and upper extremities. The patient was started on doxycycline on 09/03/2015 at  1400 hrs. Around 1800 hours, a new rash was noted on the patient's back which has since spread to her trunk and extremities. Doxycycline was stopped on 09/05/15. Unfortunately, continues EEG monitoring continues to show left anterior frontal sharp waves and seizure activity. Dilantin was added on 09/05/2015.  ID was consulted for positive RMSF IgM and IgG.  Discharge Diagnoses:  Uncontrolled epileptic seizures/acute encephalopathy -Antiepileptic drugs per neurology -valproate level was actually supratherapeutic prior to its discontinuation -continue EEG monitoring -09/05/2015--case was discussed with neurology, Dr. Leonel Ramsay -08/23/15--MRI brain negative for abnormal cortical hyperintensity -contacted [facility] bed available-->call back 6/22 -contacted [facility]--placed on wait list initially and subsequently transferred on 09/05/15 evening -continue dilantin 100 mg tid, vimpat 200 q 12, keppra 1500 q 12per neurology  Positive RMSF serology -was on doxy 6/19-6/21 -d/c doxy 6/21--cause of rash --ID consulted and recommended skin bx on 6/22--wanted to start chloramphenicol as pt continue to have intermitten low grade fevers, all <101.0  Fixed maculopapular rash -?drug reaction -d/c doxy -consult ID--discussed with Dr. Carlyle Basques  COPD -stable on RA  DM2 -CBGs  -08/21/2015 hemoglobin A1c 5.3 -hold home dose levemir 22 units due to poor po intake and CBGs 110-167 -Continue NovoLog sliding scale  Euthyroid sick syndrome -TSH 0.192 -Free T4 0.97   MEDICATION LIST PRIOR TO ADMISSION     Medication List    ASK your doctor about these medications        acetaminophen 500 MG tablet  Commonly known as:  TYLENOL  Take 1,000 mg by mouth every 6 (six) hours as needed for mild pain or moderate pain.     ALPRAZolam 1 MG tablet  Commonly known as:  XANAX  Take 1 mg by mouth 3 (three) times daily as needed. For anxiety  aspirin EC 81  MG tablet  Take 81 mg by mouth daily.     cyclobenzaprine 10 MG tablet  Commonly known as:  FLEXERIL  Take 1 tablet (10 mg total) by mouth 3 (three) times daily as needed for muscle spasms.     HYDROcodone-acetaminophen 5-325 MG tablet  Commonly known as:  NORCO/VICODIN  Take 1 tablet by mouth every 6 (six) hours as needed for moderate pain.     ibuprofen 200 MG tablet  Commonly known as:  ADVIL  Take 1 tablet (200 mg total) by mouth every 6 (six) hours as needed.     insulin glargine 100 UNIT/ML injection  Commonly known as:  LANTUS  Inject 22 Units into the skin every morning.     levETIRAcetam 500 MG tablet  Commonly known as:  KEPPRA  Take 1 tablet (500 mg total) by mouth 2 (two) times daily.     NOVOLOG FLEXPEN 100 UNIT/ML FlexPen  Generic drug:  insulin aspart  Please use the hospital sliding scale provided for you     RESTASIS 0.05 % ophthalmic emulsion  Generic drug:  cycloSPORINE  Apply 1 drop to eye 2 (two) times daily.           Follow-up Information    Follow up with [facility] On 10/09/2015.   Why:  Your appointment time is 2pm. Please arrive 15 min early and bring a picture ID and your current medications.   Contact information:   [place] [phone]      Allergies  Allergen Reactions  . Demerol Other (See Comments)    hallucinations  . Dilaudid [Hydromorphone Hcl] Nausea And Vomiting    Consultations:  Neurology, ID   Procedures/Studies: Dg Chest 2 View  08/27/2015  CLINICAL DATA:  Altered mental status. Crackles in the left lung base. EXAM: CHEST  2 VIEW COMPARISON:  Single-view of the chest 08/22/2015 and 08/20/2015. PA and lateral chest 05/21/2015. FINDINGS: Streaky airspace disease is seen in the lingula and left lung base. Minimal right basilar atelectasis is noted. Heart size is normal. No pneumothorax or pleural effusion. IMPRESSION: Streaky lingular and left basilar airspace disease  could be due to atelectasis or pneumonia. Electronically Signed   By: Inge Rise M.D.   On: 08/27/2015 10:15   Ct Head Wo Contrast  08/27/2015  CLINICAL DATA:  Altered mental status for 1 day. History of seizures. EXAM: CT HEAD WITHOUT CONTRAST TECHNIQUE: Contiguous axial images were obtained from the base of the skull through the vertex without intravenous contrast. COMPARISON:  Head CT May 31, 2014 and brain MRI August 23, 2015 FINDINGS: The ventricles are normal in size and configuration. There is slight frontal atrophy bilaterally. There is no intracranial mass, hemorrhage, extra-axial fluid collection, or midline shift. The gray-white compartments appear normal. No acute infarct evident. The bony calvarium appears intact. Mastoids on the left are clear. The mastoids on the right are largely clear with several opacified inferior right mastoid air cells. No intraorbital lesions are evident in the visualized orbital regions. IMPRESSION: Mild frontal atrophy bilaterally. Ventricles normal in size and configuration. No intracranial mass, hemorrhage, or evidence of focal infarct. Mild inferior mastoid disease on the right. Mastoids elsewhere clear. Electronically Signed   By: Lowella Grip III M.D.   On: 08/27/2015 10:43   Ct Head Wo Contrast  08/20/2015  CLINICAL DATA:  Seizure today. EXAM: CT HEAD WITHOUT CONTRAST TECHNIQUE: Contiguous axial images were obtained from the  base of the skull through the vertex without intravenous contrast. COMPARISON:  05/31/2014 FINDINGS: A small left frontal scalp hematoma is noted but no underlying skull fracture. The ventricles are in the midline without mass effect or shift. They are normal in size and configuration. No extra-axial fluid collections are identified. No findings for hemispheric infarction an or intracranial hemorrhage. No mass lesion. The brainstem and cerebellum are grossly normal. IMPRESSION: No acute intracranial findings or skull fracture. No  change since prior study. Electronically Signed   By: Marijo Sanes M.D.   On: 08/20/2015 18:42   Mr Jeri Cos [id number] Contrast  08/23/2015  CLINICAL DATA:  60 year old female with seizure, status epilepticus. Unresponsive. Initial encounter. EXAM: MRI HEAD WITHOUT AND WITH CONTRAST TECHNIQUE: Multiplanar, multiecho pulse sequences of the brain and surrounding structures were obtained without and with intravenous contrast. CONTRAST:  71mL MULTIHANCE GADOBENATE DIMEGLUMINE 529 MG/ML IV SOLN COMPARISON:  Head CTs without contrast 08/20/2015 and earlier.  Imaging Brain MRI 03/06/2010. FINDINGS: Major intracranial vascular flow voids are stable and normal. Cerebral volume is stable and normal for age. No restricted diffusion or evidence of acute infarction. No midline shift, mass effect, evidence of mass lesion, ventriculomegaly, extra-axial collection or acute intracranial hemorrhage. Cervicomedullary junction and pituitary are within normal limits. Pearline Cables and white matter signal throughout the brain appears stable and largely unremarkable for age; there are mild nonspecific white matter signal changes. No edema or acute signal abnormality in the hippocampal formations. The left hippocampal formation has a more vertical configuration chronically (better seen on series 9, image 17 in 2011). No cortical encephalomalacia or chronic cerebral blood products. No abnormal enhancement identified. No dural thickening. Mild mastoid effusions are new, greater on the right. Negative nasopharynx. Otherwise negative visualized internal auditory structures. Trace ethmoid sinus mucosal thickening. Stable and negative orbit and scalp soft tissues. Negative visualized cervical spine. Bone marrow signal is stable and within normal limits. IMPRESSION: No acute intracranial abnormality. Stable since 2011 and largely unremarkable for age MRI appearance of the brain. Electronically Signed   By: Genevie Ann M.D.   On: 08/23/2015 10:12   US  Carotid Bilateral  08/21/2015  CLINICAL DATA:  Seizures and altered mental status EXAM: BILATERAL CAROTID DUPLEX ULTRASOUND TECHNIQUE: Pearline Cables scale imaging, color Doppler and duplex ultrasound were performed of bilateral carotid and vertebral arteries in the neck. COMPARISON:  None. FINDINGS: Criteria: Quantification of carotid stenosis is based on velocity parameters that correlate the residual internal carotid diameter with NASCET-based stenosis levels, using the diameter of the distal internal carotid lumen as the denominator for stenosis measurement. The following velocity measurements were obtained: RIGHT ICA:  148 cm/sec CCA:  [id number] cm/sec SYSTOLIC ICA/CCA RATIO:  1.2 DIASTOLIC ICA/CCA RATIO:  1.2 ECA:  101 cm/sec LEFT ICA:  159 cm/sec CCA:  [id number] cm/sec SYSTOLIC ICA/CCA RATIO:  1.2 DIASTOLIC ICA/CCA RATIO:  1.1 ECA:  135 cm/sec RIGHT CAROTID ARTERY: Moderate heterogeneous soft plaque in the bulb. Low resistance internal carotid Doppler pattern. RIGHT VERTEBRAL ARTERY:  Antegrade. LEFT CAROTID ARTERY: Moderate mixed plaque in the bulb. Low resistance internal carotid Doppler pattern. LEFT VERTEBRAL ARTERY:  Antegrade. IMPRESSION: 50-69% stenosis in the right and left internal carotid arteries. Electronically Signed   By: Marybelle Killings M.D.   On: 08/21/2015 10:37   Dg Chest Port 1 View  09/05/2015  CLINICAL DATA:  Intermittent fever, seizure, possible aspiration EXAM: PORTABLE CHEST 1 VIEW COMPARISON:  08/27/2015 FINDINGS: Cardiomediastinal silhouette is stable. No acute infiltrate or pleural effusion. No pulmonary  edema. Bony thorax is unremarkable. IMPRESSION: No active disease. Electronically Signed   By: Lahoma Crocker M.D.   On: 09/05/2015 16:07   Dg Chest Port 1 View  08/22/2015  CLINICAL DATA:  Intubated patient, respiratory failure, COPD, DKA, seizure disorder, former smoker. EXAM: PORTABLE CHEST 1 VIEW COMPARISON:  Chest x-ray of August 21, 2015 FINDINGS: The lungs remain well-expanded. There is no infiltrate,  pneumothorax, or pleural effusion. The heart is normal in size. The pulmonary vascularity is mildly prominent. The endotracheal tube tip lies 4.1 cm above the carina. The esophagogastric tube tip projects below the inferior margin of the image. The observed bony thorax exhibits no acute abnormality. IMPRESSION: Intubated patient with support tubes in reasonable position. COPD. No definite acute cardiopulmonary abnormality. Electronically Signed   By: Daiwik Buffalo  [place] M.D.   On: 08/22/2015 07:50   Dg Chest Port 1 View  08/21/2015  CLINICAL DATA:  Respiratory failure EXAM: PORTABLE CHEST 1 VIEW COMPARISON:  Chest radiograph from one day prior. FINDINGS: Endotracheal tube tip is 4.3 cm above the carina. Enteric tube enters stomach with the tip not seen on this image. Stable cardiomediastinal silhouette with normal heart size. No pneumothorax. No pleural effusion. Lungs appear clear, with no acute consolidative airspace disease and no pulmonary edema. IMPRESSION: Well-positioned endotracheal and enteric tubes. No active cardiopulmonary disease. Electronically Signed   By: Ilona Sorrel M.D.   On: 08/21/2015 09:08   Dg Chest Port 1 View  08/20/2015  CLINICAL DATA:  Check endotracheal tube placement EXAM: PORTABLE CHEST 1 VIEW COMPARISON:  05/21/2015 FINDINGS: Cardiac shadow is stable. An endotracheal tube is now seen 5.8 cm above the carina. A nasogastric catheter is seen within the stomach. The lungs are well-aerated without focal infiltrate or sizable effusion. No acute bony abnormality is seen. IMPRESSION: Tubes and lines in satisfactory position. No acute abnormality noted. Electronically Signed   By: Inez Catalina M.D.   On: 08/20/2015 18:18         Discharge Exam: Filed Vitals:   09/05/15 2257 09/05/15 2303  BP: 131/60 131/60  Pulse: 99 99  Temp: 98.4 F (36.9 C) 98.4 F (36.9 C)  Resp: 18 18   Filed Vitals:   09/05/15 1408 09/05/15 2110 09/05/15 2257 09/05/15 2303  BP: 117/50 107/47 131/60  131/60  Pulse: 91 93 99 99  Temp: 98.9 F (37.2 C) 98.3 F (36.8 C) 98.4 F (36.9 C) 98.4 F (36.9 C)  TempSrc: Oral Oral  Oral  Resp: 18 18 18 18   Height:      Weight:      SpO2: 98% 96% 96% 96%    General: Pt is alert, awake, not in acute distress Cardiovascular: RRR, S1/S2 +, no rubs, no gallops Respiratory: diminished BS, no wheeze,  Abdominal: Soft, NT, ND, bowel sounds + Extremities: no edema, no cyanosis   The results of significant diagnostics from this hospitalization (including imaging, microbiology, ancillary and laboratory) are listed below for reference.    Significant Diagnostic Studies: Dg Chest 2 View  08/27/2015  CLINICAL DATA:  Altered mental status. Crackles in the left lung base. EXAM: CHEST  2 VIEW COMPARISON:  Single-view of the chest 08/22/2015 and 08/20/2015. PA and lateral chest 05/21/2015. FINDINGS: Streaky airspace disease is seen in the lingula and left lung base. Minimal right basilar atelectasis is noted. Heart size is normal. No pneumothorax or pleural effusion. IMPRESSION: Streaky lingular and left basilar airspace disease could be due to atelectasis or pneumonia. Electronically Signed   By: Marcello Moores  Dalessio M.D.   On: 08/27/2015 10:15   Ct Head Wo Contrast  08/27/2015  CLINICAL DATA:  Altered mental status for 1 day. History of seizures. EXAM: CT HEAD WITHOUT CONTRAST TECHNIQUE: Contiguous axial images were obtained from the base of the skull through the vertex without intravenous contrast. COMPARISON:  Head CT May 31, 2014 and brain MRI August 23, 2015 FINDINGS: The ventricles are normal in size and configuration. There is slight frontal atrophy bilaterally. There is no intracranial mass, hemorrhage, extra-axial fluid collection, or midline shift. The gray-white compartments appear normal. No acute infarct evident. The bony calvarium appears intact. Mastoids on the left are clear. The mastoids on the right are largely clear with several opacified inferior  right mastoid air cells. No intraorbital lesions are evident in the visualized orbital regions. IMPRESSION: Mild frontal atrophy bilaterally. Ventricles normal in size and configuration. No intracranial mass, hemorrhage, or evidence of focal infarct. Mild inferior mastoid disease on the right. Mastoids elsewhere clear. Electronically Signed   By: Lowella Grip III M.D.   On: 08/27/2015 10:43   Ct Head Wo Contrast  08/20/2015  CLINICAL DATA:  Seizure today. EXAM: CT HEAD WITHOUT CONTRAST TECHNIQUE: Contiguous axial images were obtained from the base of the skull through the vertex without intravenous contrast. COMPARISON:  05/31/2014 FINDINGS: A small left frontal scalp hematoma is noted but no underlying skull fracture. The ventricles are in the midline without mass effect or shift. They are normal in size and configuration. No extra-axial fluid collections are identified. No findings for hemispheric infarction an or intracranial hemorrhage. No mass lesion. The brainstem and cerebellum are grossly normal. IMPRESSION: No acute intracranial findings or skull fracture. No change since prior study. Electronically Signed   By: Marijo Sanes M.D.   On: 08/20/2015 18:42   Mr Jeri Cos [id number] Contrast  08/23/2015  CLINICAL DATA:  60 year old female with seizure, status epilepticus. Unresponsive. Initial encounter. EXAM: MRI HEAD WITHOUT AND WITH CONTRAST TECHNIQUE: Multiplanar, multiecho pulse sequences of the brain and surrounding structures were obtained without and with intravenous contrast. CONTRAST:  6mL MULTIHANCE GADOBENATE DIMEGLUMINE 529 MG/ML IV SOLN COMPARISON:  Head CTs without contrast 08/20/2015 and earlier. [facility] Brain MRI 03/06/2010. FINDINGS: Major intracranial vascular flow voids are stable and normal. Cerebral volume is stable and normal for age. No restricted diffusion or evidence of acute infarction. No midline shift, mass effect, evidence of mass lesion, ventriculomegaly, extra-axial  collection or acute intracranial hemorrhage. Cervicomedullary junction and pituitary are within normal limits. Pearline Cables and white matter signal throughout the brain appears stable and largely unremarkable for age; there are mild nonspecific white matter signal changes. No edema or acute signal abnormality in the hippocampal formations. The left hippocampal formation has a more vertical configuration chronically (better seen on series 9, image 17 in 2011). No cortical encephalomalacia or chronic cerebral blood products. No abnormal enhancement identified. No dural thickening. Mild mastoid effusions are new, greater on the right. Negative nasopharynx. Otherwise negative visualized internal auditory structures. Trace ethmoid sinus mucosal thickening. Stable and negative orbit and scalp soft tissues. Negative visualized cervical spine. Bone marrow signal is stable and within normal limits. IMPRESSION: No acute intracranial abnormality. Stable since 2011 and largely unremarkable for age MRI appearance of the brain. Electronically Signed   By: Genevie Ann M.D.   On: 08/23/2015 10:12   US Carotid Bilateral  08/21/2015  CLINICAL DATA:  Seizures and altered mental status EXAM: BILATERAL CAROTID DUPLEX ULTRASOUND TECHNIQUE: Pearline Cables scale imaging, color Doppler and duplex  ultrasound were performed of bilateral carotid and vertebral arteries in the neck. COMPARISON:  None. FINDINGS: Criteria: Quantification of carotid stenosis is based on velocity parameters that correlate the residual internal carotid diameter with NASCET-based stenosis levels, using the diameter of the distal internal carotid lumen as the denominator for stenosis measurement. The following velocity measurements were obtained: RIGHT ICA:  148 cm/sec CCA:  Q000111Q cm/sec SYSTOLIC ICA/CCA RATIO:  1.2 DIASTOLIC ICA/CCA RATIO:  1.2 ECA:  101 cm/sec LEFT ICA:  159 cm/sec CCA:  Q000111Q cm/sec SYSTOLIC ICA/CCA RATIO:  1.2 DIASTOLIC ICA/CCA RATIO:  1.1 ECA:  135 cm/sec RIGHT CAROTID  ARTERY: Moderate heterogeneous soft plaque in the bulb. Low resistance internal carotid Doppler pattern. RIGHT VERTEBRAL ARTERY:  Antegrade. LEFT CAROTID ARTERY: Moderate mixed plaque in the bulb. Low resistance internal carotid Doppler pattern. LEFT VERTEBRAL ARTERY:  Antegrade. IMPRESSION: 50-69% stenosis in the right and left internal carotid arteries. Electronically Signed   By: Marybelle Killings M.D.   On: 08/21/2015 10:37   Dg Chest Port 1 View  09/05/2015  CLINICAL DATA:  Intermittent fever, seizure, possible aspiration EXAM: PORTABLE CHEST 1 VIEW COMPARISON:  08/27/2015 FINDINGS: Cardiomediastinal silhouette is stable. No acute infiltrate or pleural effusion. No pulmonary edema. Bony thorax is unremarkable. IMPRESSION: No active disease. Electronically Signed   By: Lahoma Crocker M.D.   On: 09/05/2015 16:07   Dg Chest Port 1 View  08/22/2015  CLINICAL DATA:  Intubated patient, respiratory failure, COPD, DKA, seizure disorder, former smoker. EXAM: PORTABLE CHEST 1 VIEW COMPARISON:  Chest x-ray of August 21, 2015 FINDINGS: The lungs remain well-expanded. There is no infiltrate, pneumothorax, or pleural effusion. The heart is normal in size. The pulmonary vascularity is mildly prominent. The endotracheal tube tip lies 4.1 cm above the carina. The esophagogastric tube tip projects below the inferior margin of the image. The observed bony thorax exhibits no acute abnormality. IMPRESSION: Intubated patient with support tubes in reasonable position. COPD. No definite acute cardiopulmonary abnormality. Electronically Signed   By: Balinda Heacock  [place] M.D.   On: 08/22/2015 07:50   Dg Chest Port 1 View  08/21/2015  CLINICAL DATA:  Respiratory failure EXAM: PORTABLE CHEST 1 VIEW COMPARISON:  Chest radiograph from one day prior. FINDINGS: Endotracheal tube tip is 4.3 cm above the carina. Enteric tube enters stomach with the tip not seen on this image. Stable cardiomediastinal silhouette with normal heart size. No pneumothorax. No  pleural effusion. Lungs appear clear, with no acute consolidative airspace disease and no pulmonary edema. IMPRESSION: Well-positioned endotracheal and enteric tubes. No active cardiopulmonary disease. Electronically Signed   By: Ilona Sorrel M.D.   On: 08/21/2015 09:08   Dg Chest Port 1 View  08/20/2015  CLINICAL DATA:  Check endotracheal tube placement EXAM: PORTABLE CHEST 1 VIEW COMPARISON:  05/21/2015 FINDINGS: Cardiac shadow is stable. An endotracheal tube is now seen 5.8 cm above the carina. A nasogastric catheter is seen within the stomach. The lungs are well-aerated without focal infiltrate or sizable effusion. No acute bony abnormality is seen. IMPRESSION: Tubes and lines in satisfactory position. No acute abnormality noted. Electronically Signed   By: Inez Catalina M.D.   On: 08/20/2015 18:18     Microbiology: Recent Results (from the past 240 hour(s))  Blood culture (routine x 2)     Status: None   Collection Time: 08/27/15  9:24 AM  Result Value Ref Range Status   Specimen Description BLOOD LEFT ANTECUBITAL  Final   Special Requests BOTTLES DRAWN AEROBIC ONLY [place]  Final   Culture NO GROWTH 5 DAYS  Final   Report Status 09/01/2015 FINAL  Final  Blood culture (routine x 2)     Status: None   Collection Time: 08/27/15 11:28 AM  Result Value Ref Range Status   Specimen Description BLOOD LEFT ANTECUBITAL  Final   Special Requests BOTTLES DRAWN AEROBIC AND ANAEROBIC 6CC EACH  Final   Culture NO GROWTH 5 DAYS  Final   Report Status 09/01/2015 FINAL  Final  MRSA PCR Screening     Status: None   Collection Time: 08/29/15  9:28 PM  Result Value Ref Range Status   MRSA by PCR NEGATIVE NEGATIVE Final    Comment:        The GeneXpert MRSA Assay (FDA approved for NASAL specimens only), is one component of a comprehensive MRSA colonization surveillance program. It is not intended to diagnose MRSA infection nor to guide or monitor treatment for MRSA infections.      Labs: Basic  Metabolic Panel:  Recent Labs Lab 08/31/15 0535 09/01/15 0458 09/02/15 0516 09/03/15 0801 09/04/15 09/05/15 0519  NA 139 138 139 137 136 135  K 4.2 3.7 3.6 3.6 3.9 4.4  CL 109 108 106 101 104 99*  CO2 19* 21* 23 29 28 25   GLUCOSE 216* 185* 147* 108* 145* 312*  BUN 13 15 12 11 13 12   CREATININE 0.90 0.81 0.80 0.79 0.89 0.95  CALCIUM 8.6* 9.1 8.9 9.0 8.6* 8.7*  MG 1.9  --  2.0  --  2.1 1.7   Liver Function Tests:  Recent Labs Lab 08/31/15 0535  AST 22  ALT 25  ALKPHOS 22*  BILITOT 0.5  PROT 5.0*  ALBUMIN 2.3*   No results for input(s): LIPASE, AMYLASE in the last 168 hours. No results for input(s): AMMONIA in the last 168 hours. CBC:  Recent Labs Lab 08/31/15 0535 09/01/15 0458 09/04/15 09/05/15 2014  WBC 5.1 5.8 6.2 13.6*  NEUTROABS  --   --   --  11.9*  HGB 10.9* 11.1* 11.8* 11.9*  HCT 32.6* 33.5* 35.8* 35.9*  MCV 86.9 86.8 89.7 88.4  PLT 229 243 190 207   Cardiac Enzymes: No results for input(s): CKTOTAL, CKMB, CKMBINDEX, TROPONINI in the last 168 hours. BNP: Invalid input(s): POCBNP CBG:  Recent Labs Lab 09/05/15 0449 09/05/15 0755 09/05/15 1224 09/05/15 1638 09/05/15 2011  GLUCAP 343* 167* 109* 305* 238*    Time coordinating discharge:  Greater than 30 minutes  Signed:  Jory Welke, DO [facility] Pager: [phone] 09/06/2015, 7:40 AM

## 2015-09-06 NOTE — Progress Notes (Signed)
Discharge Summary successfully faxed to [facility] unit 6 Neuroscience on 6/22 @ [phone]

## 2015-09-06 NOTE — Procedures (Signed)
  Electroencephalogram report- LTM   Ordering Physician : Dr Leonel Ramsay  EEG number: [id number]  Data acquisition: 10-20 electrode placement.  Additional T1, T2, and EKG electrodes; 26 channel digital referential acquisition reformatted to 18 channel/7 channel coronal bipolar   Beginning time: 6/19 /17 at 16 34 23  Ending time: 09/06/15 at 07 34  22 am  Day of study: day 1, day 2, day 3    This intensive EEG monitoring with simultaneous video monitoring was performed for this patient with confusion to rule out clinical subclinical electrographic seizures to explain her symptoms. Medications:  vimpat  There was no pushbutton activations events during this recording.   Day 1 : Waking background activities were marked by 7  cps posterior dominant symmetric synchronized alpha rhythm which tends to attenuate with eyes opening. Superimposed there is near continuous left anterior frontal sharp waves and spikes present throughout the recording with maximum negativity at Fp1.  Frequently throughout the recording this is a left anterior frontal spikes become rhythmic synchronized and involvement and occur in 10-20 seconds trains and consistent with electrographic seizures.  There were no obvious clinical accompaniment to this frequent electrographic seizures at least not evident on video provided.  Day 2:  Waking background activities were marked by 7 cps posterior dominant symmetric synchronized alpha rhythm which tends to attenuate with eyes opening. Superimposed there is near continuous left anterior frontal sharp waves and spikes present   with maximum negativity at Fp1 as well as  electrographic seizures were present during the first half of the recording.  During the second half of the recording there is a gradual resolution of subclinical electrographic seizures and left anterior frontal spikes and sharp waves become significantly less frequent.  At times however intermittently left anterior  frontal spikes present at times short 2-4 seconds runs.  However this pattern did not demonstrates evolving  features and very short in duration, and  would not meet criteria for subclinical seizures.  Day 3 : no clinical or subclinical seizures. Infrequent left fronto polar sharp waves are present.  Clinical interpretation: This intensive EEG monitoring with simultaneous monitoring demonstrating improvement with  resolution of subclinical electrographic seizures now infrequent  left frontopolar  sharp waves. Clinical correlation is advised.

## 2015-09-10 LAB — CULTURE, BLOOD (ROUTINE X 2)
CULTURE: NO GROWTH
CULTURE: NO GROWTH

## 2015-09-10 LAB — RMSF, IGG, IFA

## 2015-09-10 LAB — ROCKY MTN SPOTTED FVR ABS PNL(IGG+IGM)
RMSF IGM: 0.49 {index} (ref 0.00–0.89)
RMSF IgG: POSITIVE — AB

## 2015-10-09 ENCOUNTER — Ambulatory Visit: Payer: Self-pay | Admitting: Family Medicine

## 2015-10-18 ENCOUNTER — Other Ambulatory Visit (HOSPITAL_COMMUNITY): Payer: Self-pay | Admitting: Pulmonary Disease

## 2015-10-18 ENCOUNTER — Ambulatory Visit (HOSPITAL_COMMUNITY)
Admission: RE | Admit: 2015-10-18 | Discharge: 2015-10-18 | Disposition: A | Discharge status: Home / Self Care | Payer: Medicaid Other | Source: Ambulatory Visit | Attending: Pulmonary Disease | Admitting: Pulmonary Disease

## 2015-10-18 DIAGNOSIS — M810 Age-related osteoporosis without current pathological fracture: Secondary | ICD-10-CM | POA: Diagnosis not present

## 2015-10-18 DIAGNOSIS — M79671 Pain in right foot: Secondary | ICD-10-CM | POA: Insufficient documentation

## 2016-01-26 ENCOUNTER — Emergency Department (HOSPITAL_COMMUNITY)
Admission: EM | Admit: 2016-01-26 | Discharge: 2016-01-26 | Disposition: A | Discharge status: Home / Self Care | Payer: Medicaid Other | Attending: Emergency Medicine | Admitting: Emergency Medicine

## 2016-01-26 ENCOUNTER — Encounter (HOSPITAL_COMMUNITY): Payer: Self-pay | Admitting: Cardiology

## 2016-01-26 ENCOUNTER — Emergency Department (HOSPITAL_COMMUNITY): Payer: Medicaid Other

## 2016-01-26 DIAGNOSIS — Z7982 Long term (current) use of aspirin: Secondary | ICD-10-CM | POA: Insufficient documentation

## 2016-01-26 DIAGNOSIS — Z794 Long term (current) use of insulin: Secondary | ICD-10-CM | POA: Insufficient documentation

## 2016-01-26 DIAGNOSIS — R109 Unspecified abdominal pain: Secondary | ICD-10-CM | POA: Insufficient documentation

## 2016-01-26 DIAGNOSIS — Z79899 Other long term (current) drug therapy: Secondary | ICD-10-CM | POA: Insufficient documentation

## 2016-01-26 DIAGNOSIS — R110 Nausea: Secondary | ICD-10-CM | POA: Diagnosis not present

## 2016-01-26 DIAGNOSIS — G40909 Epilepsy, unspecified, not intractable, without status epilepticus: Secondary | ICD-10-CM | POA: Diagnosis not present

## 2016-01-26 DIAGNOSIS — E119 Type 2 diabetes mellitus without complications: Secondary | ICD-10-CM | POA: Diagnosis not present

## 2016-01-26 DIAGNOSIS — J449 Chronic obstructive pulmonary disease, unspecified: Secondary | ICD-10-CM | POA: Insufficient documentation

## 2016-01-26 DIAGNOSIS — Z87891 Personal history of nicotine dependence: Secondary | ICD-10-CM | POA: Diagnosis not present

## 2016-01-26 DIAGNOSIS — R569 Unspecified convulsions: Secondary | ICD-10-CM

## 2016-01-26 HISTORY — DX: Cerebral infarction, unspecified: I63.9

## 2016-01-26 LAB — CBC WITH DIFFERENTIAL/PLATELET
BASOS ABS: 0 10*3/uL (ref 0.0–0.1)
Basophils Relative: 0 %
EOS PCT: 1 %
Eosinophils Absolute: 0.1 10*3/uL (ref 0.0–0.7)
HEMATOCRIT: 39.5 % (ref 36.0–46.0)
Hemoglobin: 13.1 g/dL (ref 12.0–15.0)
LYMPHS ABS: 2.9 10*3/uL (ref 0.7–4.0)
LYMPHS PCT: 30 %
MCH: 29.3 pg (ref 26.0–34.0)
MCHC: 33.2 g/dL (ref 30.0–36.0)
MCV: 88.4 fL (ref 78.0–100.0)
MONO ABS: 0.5 10*3/uL (ref 0.1–1.0)
Monocytes Relative: 5 %
NEUTROS ABS: 5.9 10*3/uL (ref 1.7–7.7)
Neutrophils Relative %: 64 %
PLATELETS: 271 10*3/uL (ref 150–400)
RBC: 4.47 MIL/uL (ref 3.87–5.11)
RDW: 13.6 % (ref 11.5–15.5)
WBC: 9.5 10*3/uL (ref 4.0–10.5)

## 2016-01-26 LAB — URINALYSIS, ROUTINE W REFLEX MICROSCOPIC
BILIRUBIN URINE: NEGATIVE
Glucose, UA: NEGATIVE mg/dL
HGB URINE DIPSTICK: NEGATIVE
KETONES UR: NEGATIVE mg/dL
NITRITE: NEGATIVE
PROTEIN: NEGATIVE mg/dL
Specific Gravity, Urine: 1.01 (ref 1.005–1.030)
pH: 6.5 (ref 5.0–8.0)

## 2016-01-26 LAB — COMPREHENSIVE METABOLIC PANEL
ALT: 19 U/L (ref 14–54)
AST: 22 U/L (ref 15–41)
Albumin: 4.3 g/dL (ref 3.5–5.0)
Alkaline Phosphatase: 50 U/L (ref 38–126)
Anion gap: 9 (ref 5–15)
BILIRUBIN TOTAL: 0.4 mg/dL (ref 0.3–1.2)
BUN: 18 mg/dL (ref 6–20)
CALCIUM: 9.5 mg/dL (ref 8.9–10.3)
CHLORIDE: 105 mmol/L (ref 101–111)
CO2: 27 mmol/L (ref 22–32)
CREATININE: 0.89 mg/dL (ref 0.44–1.00)
Glucose, Bld: 97 mg/dL (ref 65–99)
Potassium: 4 mmol/L (ref 3.5–5.1)
Sodium: 141 mmol/L (ref 135–145)
TOTAL PROTEIN: 7.3 g/dL (ref 6.5–8.1)

## 2016-01-26 LAB — URINE MICROSCOPIC-ADD ON

## 2016-01-26 LAB — LIPASE, BLOOD: LIPASE: 21 U/L (ref 11–51)

## 2016-01-26 LAB — AMMONIA: AMMONIA: 24 umol/L (ref 9–35)

## 2016-01-26 LAB — CBG MONITORING, ED: GLUCOSE-CAPILLARY: 92 mg/dL (ref 65–99)

## 2016-01-26 MED ORDER — ONDANSETRON 8 MG PO TBDP: 8.0000 mg | ORAL_TABLET | Freq: Three times a day (TID) | ORAL | 0 refills | Status: DC | PRN

## 2016-01-26 MED ORDER — ACETAMINOPHEN 500 MG PO TABS
1000.0000 mg | ORAL_TABLET | Freq: Once | ORAL | Status: AC
  Administered 2016-01-26: 1000 mg via ORAL
  Filled 2016-01-26: qty 2

## 2016-01-26 MED ORDER — ONDANSETRON 4 MG PO TBDP
4.0000 mg | ORAL_TABLET | Freq: Once | ORAL | Status: AC
  Administered 2016-01-26: 4 mg via ORAL
  Filled 2016-01-26: qty 1

## 2016-01-26 MED ORDER — IOPAMIDOL (ISOVUE-300) INJECTION 61%
100.0000 mL | Freq: Once | INTRAVENOUS | Status: AC | PRN
  Administered 2016-01-26: 100 mL via INTRAVENOUS

## 2016-01-26 MED ORDER — ONDANSETRON HCL 4 MG/2ML IJ SOLN: 4.0000 mg | Freq: Once | INTRAMUSCULAR | Status: DC

## 2016-01-26 MED ORDER — LEVETIRACETAM IN NACL 500 MG/100ML IV SOLN
500.0000 mg | Freq: Once | INTRAVENOUS | Status: DC
  Filled 2016-01-26: qty 100

## 2016-01-26 MED ORDER — IOPAMIDOL (ISOVUE-300) INJECTION 61%
INTRAVENOUS | Status: AC
  Filled 2016-01-26: qty 30

## 2016-01-26 NOTE — ED Notes (Signed)
Two attempts made to get IV. Right AC and Left AC.

## 2016-01-26 NOTE — ED Notes (Signed)
Drinking po contrast. Tolerating well.

## 2016-01-26 NOTE — ED Notes (Signed)
Bloodsugar tested resulted 92

## 2016-01-26 NOTE — ED Notes (Signed)
Patient transported to CT 

## 2016-01-26 NOTE — ED Provider Notes (Signed)
[facility] DEPT Provider Note   CSN: [id number] Arrival date & time: 01/26/16  [id number]     History   Chief Complaint Chief Complaint  Patient presents with  . Abdominal Pain    HPI Veronica Moses is a 60 y.o. female.  The history is provided by the patient. No language interpreter was used.  Abdominal Pain   This is a new problem. The problem has been gradually worsening. The pain is associated with eating. The pain is located in the generalized abdominal region. The pain is at a severity of 7/10. The pain is moderate. Associated symptoms include nausea, arthralgias and myalgias. Nothing aggravates the symptoms. Nothing relieves the symptoms. Past workup does not include surgery.  Pt has a history of poorly controlled diabetes.  Pt reports she has seizures.  Family reports they are concerned that pt could be having seizures because she complains of bodyaches when she has seixures.  Pt's MD Dr. Phineas Douglas in [place] is tapering her off Keppra because it caused her to lose her hair.  Past Medical History:  Diagnosis Date  . COPD (chronic obstructive pulmonary disease) ([place])   . Diabetes mellitus   . Seizures ([place])   . Stroke ([place])   . UTI (lower urinary tract infection)     Patient Active Problem List   Diagnosis Date Noted  . Hypokalemia   . Psychosocial stressors   . Acute encephalopathy   . Insulin dependent diabetes mellitus ([place])   . HCAP (healthcare-associated pneumonia) 08/27/2015  . Status epilepticus ([place]) 08/20/2015  . Seizure ([place]) 08/20/2015  . DKA (diabetic ketoacidoses) ([place]) 08/20/2015  . Fall 05/07/2014  . History of sprained wrist 05/07/2014  . Protein-calorie malnutrition, severe ([place]) 10/25/2013  . Numbness, limb 10/24/2013  . Chest pain 05/30/2013  . Diabetes ([place]) 02/08/2013  . COPD (chronic obstructive pulmonary disease) ([place]) 02/08/2013  . GERD (gastroesophageal reflux disease) 02/08/2013  . Abdominal pain, chronic, epigastric 02/08/2013     Past Surgical History:  Procedure Laterality Date  . ABDOMINAL HYSTERECTOMY    . BREAST SURGERY    . CHOLECYSTECTOMY    . COLONOSCOPY WITH ESOPHAGOGASTRODUODENOSCOPY (EGD) N/A 03/04/2013   Procedure: COLONOSCOPY WITH ESOPHAGOGASTRODUODENOSCOPY (EGD);  Surgeon: Rogene Houston, MD;  Location[facility];  Service: Endoscopy;  Laterality: N/A;  1200-moved to [place] notified pt  . NEPHRECTOMY      OB History    No data available       Home Medications    Prior to Admission medications   Medication Sig Start Date End Date Taking? Authorizing Provider  acetaminophen (TYLENOL) 500 MG tablet Take 1,000 mg by mouth every 6 (six) hours as needed for mild pain or moderate pain.   Yes Historical Provider, MD  ALPRAZolam Duanne Moron) 1 MG tablet Take 1 mg by mouth daily as needed for anxiety.   Yes Historical Provider, MD  aspirin EC 81 MG tablet Take 81 mg by mouth daily.   Yes Historical Provider, MD  gabapentin (NEURONTIN) 100 MG capsule Take 100 mg by mouth at bedtime.   Yes Historical Provider, MD  ibuprofen (ADVIL,MOTRIN) 200 MG tablet Take 200 mg by mouth every 6 (six) hours as needed for moderate pain.   Yes Historical Provider, MD  insulin glargine (LANTUS) 100 UNIT/ML injection Inject 11 Units into the skin daily.   Yes Historical Provider, MD  lacosamide (VIMPAT) 200 MG TABS tablet Take 200 mg by mouth 2 (two) times daily.   Yes Historical Provider, MD  levETIRAcetam (  KEPPRA) 250 MG tablet Take 3 tablets by mouth 2 (two) times daily. 12/26/15 03/05/16 Yes Historical Provider, MD  NOVOLOG FLEXPEN 100 UNIT/ML FlexPen Please use the hospital sliding scale provided for you Patient taking differently: Inject 10 Units into the skin 3 (three) times daily with meals. Please use the hospital sliding scale provided for you 08/25/15  Yes Sinda Du, MD  RESTASIS 0.05 % ophthalmic emulsion Apply 1 drop to eye 2 (two) times daily. 04/29/14  Yes Historical Provider, MD    Family  History Family History  Problem Relation Age of Onset  . Diabetes Father   . CAD      family history  . Diabetes Brother   . Colon cancer Neg Hx     Social History Social History  Substance Use Topics  . Smoking status: Former Smoker    Packs/day: 1.00    Years: 26.00    Types: Cigarettes  . Smokeless tobacco: Former Systems developer    Quit date: 05/15/2012  . Alcohol use No     Allergies   Demerol; Dilaudid [hydromorphone hcl]; Depakote [divalproex sodium]; and Doxycycline   Review of Systems Review of Systems  Gastrointestinal: Positive for abdominal pain and nausea.  Musculoskeletal: Positive for arthralgias and myalgias.  Psychiatric/Behavioral: Positive for confusion.  All other systems reviewed and are negative.    Physical Exam Updated Vital Signs BP 168/67   Pulse 67   Resp 17   Ht 5\' 2"  (1.575 m)   Wt 50.3 kg   SpO2 97%   BMI 20.30 kg/m   Physical Exam  Constitutional: She is oriented to person, place, and time. She appears well-developed and well-nourished.  HENT:  Head: Normocephalic.  Right Ear: External ear normal.  Left Ear: External ear normal.  Mouth/Throat: Oropharynx is clear and moist.  Eyes: Conjunctivae and EOM are normal. Pupils are equal, round, and reactive to light.  Neck: Normal range of motion.  Cardiovascular: Normal rate and regular rhythm.   Pulmonary/Chest: Effort normal.  Abdominal: Soft. Bowel sounds are normal. She exhibits no distension.  Musculoskeletal: Normal range of motion.  Neurological: She is alert and oriented to person, place, and time.  Skin: Skin is warm.  Psychiatric: She has a normal mood and affect.  Nursing note and vitals reviewed.    ED Treatments / Results  Labs (all labs ordered are listed, but only abnormal results are displayed) Labs Reviewed  URINALYSIS, ROUTINE W REFLEX MICROSCOPIC (NOT AT [facility]) - Abnormal; Notable for the following:       Result Value   Leukocytes, UA LARGE (*)    All other  components within normal limits  URINE MICROSCOPIC-ADD ON - Abnormal; Notable for the following:    Squamous Epithelial / LPF 6-30 (*)    Bacteria, UA FEW (*)    All other components within normal limits  URINE CULTURE  CBC WITH DIFFERENTIAL/PLATELET  COMPREHENSIVE METABOLIC PANEL  AMMONIA  LIPASE, BLOOD  CBG MONITORING, ED    EKG  EKG Interpretation  Date/Time:  Saturday January 26 2016 10:21:20 EST Ventricular Rate:  65 PR Interval:    QRS Duration: 85 QT Interval:  387 QTC Calculation: 403 R Axis:   19 Text Interpretation:  Sinus rhythm Borderline short PR interval No significant change was found Confirmed by CAMPOS  MD, KEVIN ([id number]) on 01/26/2016 11:30:34 AM       Radiology Dg Chest 2 View  Result Date: 01/26/2016 CLINICAL DATA:  Pain all over. EXAM: CHEST  2 VIEW COMPARISON:  September 05, 2015 FINDINGS: The heart size remains borderline. No other interval changes or acute abnormalities. IMPRESSION: No active cardiopulmonary disease. Electronically Signed   By: Dorise Bullion III M.D   On: 01/26/2016 12:25   Ct Head Wo Contrast  Result Date: 01/26/2016 CLINICAL DATA:  Confusion.  History of CVA and seizures. EXAM: CT HEAD WITHOUT CONTRAST TECHNIQUE: Contiguous axial images were obtained from the base of the skull through the vertex without intravenous contrast. COMPARISON:  08/27/2015 head CT. FINDINGS: Brain: No evidence of parenchymal hemorrhage or extra-axial fluid collection. No mass lesion, mass effect, or midline shift. No CT evidence of acute infarction. Intracranial atherosclerosis. Nonspecific mild subcortical and periventricular white matter hypodensity, most in keeping with chronic small vessel ischemic change. Mild generalized cerebral volume loss. No ventriculomegaly. Vascular: No hyperdense vessel or unexpected calcification. Skull: No evidence of calvarial fracture. Sinuses/Orbits: The visualized paranasal sinuses are essentially clear. Other:  The mastoid air  cells are unopacified. IMPRESSION: 1.  No evidence of acute intracranial abnormality. 2. Generalized cerebral volume loss and chronic small vessel ischemia. Electronically Signed   By: Ilona Sorrel M.D.   On: 01/26/2016 11:01   Ct Abdomen Pelvis W Contrast  Result Date: 01/26/2016 CLINICAL DATA:  Patient with diffuse abdominal pain. History of diabetes. EXAM: CT ABDOMEN AND PELVIS WITH CONTRAST TECHNIQUE: Multidetector CT imaging of the abdomen and pelvis was performed using the standard protocol following bolus administration of intravenous contrast. CONTRAST:  145mL ISOVUE-300 IOPAMIDOL (ISOVUE-300) INJECTION 61% COMPARISON:  Abdomen pelvis CT 01/04/2013. FINDINGS: Lower chest: Normal heart size. Minimal atelectasis within the lung bases bilaterally. No pleural effusion. Hepatobiliary: Re- demonstrated flash filling hemangioma within the hepatic dome and right hepatic lobe. Liver is normal in size and contour. Patient status post cholecystectomy. No intrahepatic or extrahepatic biliary ductal dilatation given post cholecystectomy state. Pancreas: Unremarkable Spleen: Unremarkable Adrenals/Urinary Tract: The adrenal glands are normal. Surgically absent left kidney. Right kidney is normal in appearance. Urinary bladder is unremarkable. Stomach/Bowel: No abnormal bowel wall thickening or evidence for bowel obstruction. No free fluid or free intraperitoneal air. Normal morphology of the stomach. Vascular/Lymphatic: Peripheral calcified atherosclerotic plaque involving the abdominal aorta. No retroperitoneal lymphadenopathy. Reproductive: Status post hysterectomy. Other: None. Musculoskeletal: No aggressive or acute appearing osseous lesions. Lumbar spine degenerative changes. IMPRESSION: No acute process within the abdomen or pelvis. Electronically Signed   By: Lovey Newcomer M.D.   On: 01/26/2016 15:03    Procedures Procedures (including critical care time)  Medications Ordered in ED Medications  iopamidol  (ISOVUE-300) 61 % injection (not administered)  ondansetron (ZOFRAN-ODT) disintegrating tablet 4 mg (4 mg Oral Given 01/26/16 1122)  acetaminophen (TYLENOL) tablet 1,000 mg (1,000 mg Oral Given 01/26/16 1151)  iopamidol (ISOVUE-300) 61 % injection 100 mL (100 mLs Intravenous Contrast Given 01/26/16 1445)     Initial Impression / Assessment and Plan / ED Course  I have reviewed the triage vital signs and the nursing notes.  Pertinent labs & imaging results that were available during my care of the patient were reviewed by me and considered in my medical decision making (see chart for details).  Clinical Course     Dr. Venora Maples in to see and examine  Final Clinical Impressions(s) / ED Diagnoses   Final diagnoses:  Seizure ([place])  Abdominal pain, unspecified abdominal location    New Prescriptions New Prescriptions   No medications on file     Fransico Meadow, PA-C 01/27/16 [place], MD 01/27/16 1152

## 2016-01-26 NOTE — ED Triage Notes (Signed)
Pain "all over" since last night. States pain is worse in her head and abdomen.

## 2016-01-26 NOTE — ED Notes (Signed)
Ambulated pt to BR and back to bed with moderate assist, pt unsteady with gait

## 2016-01-26 NOTE — ED Notes (Signed)
Bambi with radiology  in with pt. Taking her to CT

## 2016-01-28 LAB — URINE CULTURE

## 2016-02-18 ENCOUNTER — Emergency Department (HOSPITAL_COMMUNITY)
Admission: EM | Admit: 2016-02-18 | Discharge: 2016-02-18 | Disposition: A | Discharge status: Home / Self Care | Payer: Medicaid Other | Attending: Emergency Medicine | Admitting: Emergency Medicine

## 2016-02-18 ENCOUNTER — Encounter (HOSPITAL_COMMUNITY): Payer: Self-pay | Admitting: Emergency Medicine

## 2016-02-18 DIAGNOSIS — Y999 Unspecified external cause status: Secondary | ICD-10-CM | POA: Diagnosis not present

## 2016-02-18 DIAGNOSIS — Z79899 Other long term (current) drug therapy: Secondary | ICD-10-CM | POA: Diagnosis not present

## 2016-02-18 DIAGNOSIS — Z23 Encounter for immunization: Secondary | ICD-10-CM | POA: Diagnosis not present

## 2016-02-18 DIAGNOSIS — Z794 Long term (current) use of insulin: Secondary | ICD-10-CM | POA: Diagnosis not present

## 2016-02-18 DIAGNOSIS — Z7982 Long term (current) use of aspirin: Secondary | ICD-10-CM | POA: Diagnosis not present

## 2016-02-18 DIAGNOSIS — Y9389 Activity, other specified: Secondary | ICD-10-CM | POA: Insufficient documentation

## 2016-02-18 DIAGNOSIS — S61239A Puncture wound without foreign body of unspecified finger without damage to nail, initial encounter: Secondary | ICD-10-CM

## 2016-02-18 DIAGNOSIS — J449 Chronic obstructive pulmonary disease, unspecified: Secondary | ICD-10-CM | POA: Diagnosis not present

## 2016-02-18 DIAGNOSIS — W461XXA Contact with contaminated hypodermic needle, initial encounter: Secondary | ICD-10-CM | POA: Insufficient documentation

## 2016-02-18 DIAGNOSIS — W273XXA Contact with needle (sewing), initial encounter: Secondary | ICD-10-CM

## 2016-02-18 DIAGNOSIS — Y929 Unspecified place or not applicable: Secondary | ICD-10-CM | POA: Insufficient documentation

## 2016-02-18 DIAGNOSIS — E119 Type 2 diabetes mellitus without complications: Secondary | ICD-10-CM | POA: Diagnosis not present

## 2016-02-18 DIAGNOSIS — IMO0001 Reserved for inherently not codable concepts without codable children: Secondary | ICD-10-CM

## 2016-02-18 DIAGNOSIS — Z7721 Contact with and (suspected) exposure to potentially hazardous body fluids: Secondary | ICD-10-CM | POA: Insufficient documentation

## 2016-02-18 DIAGNOSIS — Z87891 Personal history of nicotine dependence: Secondary | ICD-10-CM | POA: Diagnosis not present

## 2016-02-18 MED ORDER — AMOXICILLIN-POT CLAVULANATE 875-125 MG PO TABS
1.0000 | ORAL_TABLET | Freq: Once | ORAL | Status: AC
  Administered 2016-02-18: 1 via ORAL
  Filled 2016-02-18: qty 1

## 2016-02-18 MED ORDER — AMOXICILLIN-POT CLAVULANATE 875-125 MG PO TABS: 1.0000 | ORAL_TABLET | Freq: Once | ORAL | Status: DC

## 2016-02-18 MED ORDER — AMOXICILLIN-POT CLAVULANATE 875-125 MG PO TABS: 1.0000 | ORAL_TABLET | Freq: Two times a day (BID) | ORAL | 0 refills | Status: DC

## 2016-02-18 MED ORDER — TETANUS-DIPHTH-ACELL PERTUSSIS 5-2.5-18.5 LF-MCG/0.5 IM SUSP
0.5000 mL | Freq: Once | INTRAMUSCULAR | Status: AC
  Administered 2016-02-18: 0.5 mL via INTRAMUSCULAR
  Filled 2016-02-18: qty 0.5

## 2016-02-18 NOTE — Discharge Instructions (Signed)
Please cleanse the wound with soap and water. Please use Augmentin 2 times daily with food until all taken. This medication may cause diarrhea. Please observe for signs of infection, see Dr. Luan Pulling for additional evaluation if not improving. Someone from the flow managers office at the [facility] will call you if any abnormality of your labs.

## 2016-02-18 NOTE — ED Provider Notes (Signed)
[facility] DEPT Provider Note   CSN: [id number] Arrival date & time: 02/18/16  [id number]  By signing my name below, I, Veronica Moses, attest that this documentation has been prepared under the direction and in the presence of Lily Kocher, PA-C. Electronically Signed: Rayna Moses, ED Scribe. 02/18/16. 7:35 PM.   History   Chief Complaint Chief Complaint  Patient presents with  . Body Fluid Exposure    HPI HPI Comments: Veronica Moses is a 60 y.o. female who presents to the Emergency Department due to exposure with a dirty syringe which occurred two days ago. She states that she found a syringe with a dirty, bent needle and when she grabbed it she was stuck in the left index finger. She denies any bleeding occurred at the time. Pt states it belongs to her granddaughter with a known h/o IVDA who is currently incarcerated. Pt is sure that the needle was used by both her granddaughter as well as her friends. Her tetanus vaccination is UTD. She has a h/o IDDM. Pt denies a h/o HIV or hepatitis. She has no h/o transplants. No other associated symptoms at this time.   The history is provided by the patient. No language interpreter was used.  Body Fluid Exposure  Type of exposure:  Needle stick Exposure substance: unknown   Exposure location:  Finger Finger exposure location:  L index finger Time since exposure:  2 days Context:  Family member Patient immunocompromised: yes   Patient HIV status:  Unknown Patient Hepatitis B status:  Unknown Patient Hepatitis C status:  Unknown Known source: yes   Source HIV status:  Unknown Source hepatitis B status:  Unknown Source hepatitis C status:  Unknown   Past Medical History:  Diagnosis Date  . COPD (chronic obstructive pulmonary disease) ([place])   . Diabetes mellitus   . Seizures ([place])   . Stroke ([place])   . UTI (lower urinary tract infection)     Patient Active Problem List   Diagnosis Date Noted  . Hypokalemia   . Psychosocial  stressors   . Acute encephalopathy   . Insulin dependent diabetes mellitus ([place])   . HCAP (healthcare-associated pneumonia) 08/27/2015  . Status epilepticus ([place]) 08/20/2015  . Seizure ([place]) 08/20/2015  . DKA (diabetic ketoacidoses) ([place]) 08/20/2015  . Fall 05/07/2014  . History of sprained wrist 05/07/2014  . Protein-calorie malnutrition, severe ([place]) 10/25/2013  . Numbness, limb 10/24/2013  . Chest pain 05/30/2013  . Diabetes ([place]) 02/08/2013  . COPD (chronic obstructive pulmonary disease) ([place]) 02/08/2013  . GERD (gastroesophageal reflux disease) 02/08/2013  . Abdominal pain, chronic, epigastric 02/08/2013    Past Surgical History:  Procedure Laterality Date  . ABDOMINAL HYSTERECTOMY    . BREAST SURGERY    . CHOLECYSTECTOMY    . COLONOSCOPY WITH ESOPHAGOGASTRODUODENOSCOPY (EGD) N/A 03/04/2013   Procedure: COLONOSCOPY WITH ESOPHAGOGASTRODUODENOSCOPY (EGD);  Surgeon: Rogene Houston, MD;  Location[facility];  Service: Endoscopy;  Laterality: N/A;  1200-moved to [place] notified pt  . NEPHRECTOMY      OB History    No data available       Home Medications    Prior to Admission medications   Medication Sig Start Date End Date Taking? Authorizing Provider  acetaminophen (TYLENOL) 500 MG tablet Take 1,000 mg by mouth every 6 (six) hours as needed for mild pain or moderate pain.    Historical Provider, MD  ALPRAZolam Duanne Moron) 1 MG tablet Take 1 mg by mouth daily as needed for anxiety.  Historical Provider, MD  aspirin EC 81 MG tablet Take 81 mg by mouth daily.    Historical Provider, MD  gabapentin (NEURONTIN) 100 MG capsule Take 100 mg by mouth at bedtime.    Historical Provider, MD  ibuprofen (ADVIL,MOTRIN) 200 MG tablet Take 200 mg by mouth every 6 (six) hours as needed for moderate pain.    Historical Provider, MD  insulin glargine (LANTUS) 100 UNIT/ML injection Inject 11 Units into the skin daily.    Historical Provider, MD  lacosamide (VIMPAT) 200 MG TABS tablet  Take 200 mg by mouth 2 (two) times daily.    Historical Provider, MD  levETIRAcetam (KEPPRA) 250 MG tablet Take 3 tablets by mouth 2 (two) times daily. 12/26/15 03/05/16  Historical Provider, MD  NOVOLOG FLEXPEN 100 UNIT/ML FlexPen Please use the hospital sliding scale provided for you Patient taking differently: Inject 10 Units into the skin 3 (three) times daily with meals. Please use the hospital sliding scale provided for you 08/25/15   Sinda Du, MD  ondansetron (ZOFRAN ODT) 8 MG disintegrating tablet Take 1 tablet (8 mg total) by mouth every 8 (eight) hours as needed for nausea or vomiting. 01/26/16   Jola Schmidt, MD  RESTASIS 0.05 % ophthalmic emulsion Apply 1 drop to eye 2 (two) times daily. 04/29/14   Historical Provider, MD    Family History Family History  Problem Relation Age of Onset  . Diabetes Father   . CAD      family history  . Diabetes Brother   . Colon cancer Neg Hx     Social History Social History  Substance Use Topics  . Smoking status: Former Smoker    Packs/day: 1.00    Years: 26.00    Types: Cigarettes  . Smokeless tobacco: Former Systems developer    Quit date: 05/15/2012  . Alcohol use No     Allergies   Demerol; Dilaudid [hydromorphone hcl]; Depakote [divalproex sodium]; and Doxycycline   Review of Systems Review of Systems  Skin: Negative for color change and wound.  All other systems reviewed and are negative.  Physical Exam Updated Vital Signs BP (!) 169/54 (BP Location: Left Arm)   Pulse 99   Temp 97.8 F (36.6 C) (Oral)   Resp 20   Ht 5\' 1"  (1.549 m)   Wt 111 lb (50.3 kg)   SpO2 98%   BMI 20.97 kg/m   Physical Exam  Constitutional: She is oriented to person, place, and time. She appears well-developed and well-nourished.  HENT:  Head: Normocephalic and atraumatic.  Eyes: EOM are normal.  Neck: Normal range of motion.  Cardiovascular: Normal rate, regular rhythm and intact distal pulses.   Murmur heard.  Systolic murmur is present  with a grade of 2/6  Pulses:      Radial pulses are 2+ on the left side.  Pulmonary/Chest: Effort normal and breath sounds normal. No respiratory distress. She has no wheezes. She has no rales.  Symmetrical rise and fall of the chest. LCTA.   Abdominal: Soft.  Musculoskeletal: Normal range of motion.  FROM of the left index finger. Degenerative joint changes are present. No palpable nodes of the axilla on the left side. Radial pulse 2+. Capillary refill <2 seconds.   Neurological: She is alert and oriented to person, place, and time.  Skin: Skin is warm and dry. Capillary refill takes less than 2 seconds.  Psychiatric: She has a normal mood and affect.  Nursing note and vitals reviewed.  ED Treatments / Results  Labs (all labs ordered are listed, but only abnormal results are displayed) Labs Reviewed - No data to display  EKG  EKG Interpretation None       Radiology No results found.  Procedures Procedures  DIAGNOSTIC STUDIES: Oxygen Saturation is 98% on RA, normal by my interpretation.    COORDINATION OF CARE: 7:34 PM Discussed next steps with pt. Pt verbalized understanding and is agreeable with the plan.    Medications Ordered in ED Medications - No data to display   Initial Impression / Assessment and Plan / ED Course  I have reviewed the triage vital signs and the nursing notes.  Pertinent labs & imaging results that were available during my care of the patient were reviewed by me and considered in my medical decision making (see chart for details).  Clinical Course     **I have reviewed nursing notes, vital signs, and all appropriate lab and imaging results for this patient.*  **I personally performed the services described in this documentation, which was scribed in my presence. The recorded information has been reviewed and is accurate.*  Final Clinical Impressions(s) / ED Diagnoses  Patient sustained a needlestick from a needle of a IV drug user. The  needle had not been used for over a week. The incident happened 2 or 3 days ago. The patient's tetanus was updated. Vital signs are within normal limits. Patient placed on Augmentin prophylactically, with instructions to take it with food. I've also instructed the patient of possibility of diarrhea that comes with this antibiotic, and several other antibiotics. The patient will follow with her primary physician if any changes, problems, or concerns. Hepatitis profile and HIV sent to the lab.    Final diagnoses:  Needlestick injury of finger, initial encounter    New Prescriptions New Prescriptions   No medications on file     Lily Kocher, PA-C 02/18/16 2014    Fredia Sorrow, MD 02/20/16 [phone]

## 2016-02-18 NOTE — ED Triage Notes (Signed)
Patient states "my granddaughter is on drugs really bad and evidently she left a dirty syringe laying there. I picked it up today and my daughter also picked it up and we both got stuck with it." Daughter states she called PCP and was told to go to ER for prophylactic medication. Patient states she was stuck in left first finger.

## 2016-02-20 LAB — HEPATITIS PANEL, ACUTE
HCV AB: 0.1 {s_co_ratio} (ref 0.0–0.9)
HEP B S AG: NEGATIVE
Hep A IgM: NEGATIVE
Hep B C IgM: NEGATIVE

## 2016-02-20 LAB — HIV ANTIBODY (ROUTINE TESTING W REFLEX): HIV SCREEN 4TH GENERATION: NONREACTIVE

## 2016-05-29 ENCOUNTER — Encounter (HOSPITAL_COMMUNITY): Payer: Self-pay | Admitting: *Deleted

## 2016-05-29 NOTE — Progress Notes (Addendum)
Mrs Rindfleisch states that she usually takes 14 units of Insulin in the morning and she takes 7 units of Novolog before meals and at 9 pm.  Patient stated that her endocrinologist , Dr Ronnald Ramp at [facility] has instructed her to take 10 units of [place] Insulin when she is not eating.  Mrs Harm reports that she has not had any low `CBG's since Dr Ronnald Ramp decreased Insulin.  Patient does not usually take Novolog when she is not eating.  I paged Diabetic coordinator to discuss patient and ask for recommendations regarding in am.  Mrs Mcilwain is not sure what medication she takes for HTN, she is going to call me when her sister arrives ferom the pharmacy with medications.  I spoke with Fredrich Birks, diabetis coordinator who instructed me to have patient not take Novolog Insulin at 9 pm and only take Novolog in am if CBG > 220 and then to take 1/2 of SS dose.  I instructed patient to check CBG to check CBG and if it is less than 70 to treat it with Glucose Gel, Glucose tablets or 1/2 cup of clear juice like apple juice or cranberry juice.  I instructed patient to recheck CBG in 15 minutes and if CBG is not greater than 70, to  Call [phone] (pre- op). If it is before pre-op opens to retreat as before and recheck CBG in 15 minutes. I told patient to make note of time that liquid is taken and amount, that surgical time may have to be adjusted.

## 2016-05-30 ENCOUNTER — Encounter (HOSPITAL_COMMUNITY): Payer: Self-pay | Admitting: *Deleted

## 2016-05-30 ENCOUNTER — Ambulatory Visit (HOSPITAL_COMMUNITY): Payer: Medicaid Other | Admitting: Certified Registered"

## 2016-05-30 ENCOUNTER — Encounter (HOSPITAL_COMMUNITY)
Admission: RE | Disposition: A | Discharge status: Home / Self Care | Payer: Self-pay | Source: Ambulatory Visit | Attending: Oral Surgery

## 2016-05-30 ENCOUNTER — Ambulatory Visit (HOSPITAL_COMMUNITY)
Admission: RE | Admit: 2016-05-30 | Discharge: 2016-05-30 | Disposition: A | Discharge status: Home / Self Care | Payer: Medicaid Other | Source: Ambulatory Visit | Attending: Oral Surgery | Admitting: Oral Surgery

## 2016-05-30 DIAGNOSIS — J449 Chronic obstructive pulmonary disease, unspecified: Secondary | ICD-10-CM | POA: Insufficient documentation

## 2016-05-30 DIAGNOSIS — Z794 Long term (current) use of insulin: Secondary | ICD-10-CM | POA: Insufficient documentation

## 2016-05-30 DIAGNOSIS — K0530 Chronic periodontitis, unspecified: Secondary | ICD-10-CM | POA: Insufficient documentation

## 2016-05-30 DIAGNOSIS — I10 Essential (primary) hypertension: Secondary | ICD-10-CM | POA: Diagnosis not present

## 2016-05-30 DIAGNOSIS — K029 Dental caries, unspecified: Secondary | ICD-10-CM | POA: Insufficient documentation

## 2016-05-30 DIAGNOSIS — Z8673 Personal history of transient ischemic attack (TIA), and cerebral infarction without residual deficits: Secondary | ICD-10-CM | POA: Insufficient documentation

## 2016-05-30 DIAGNOSIS — R569 Unspecified convulsions: Secondary | ICD-10-CM | POA: Insufficient documentation

## 2016-05-30 DIAGNOSIS — Z881 Allergy status to other antibiotic agents status: Secondary | ICD-10-CM | POA: Insufficient documentation

## 2016-05-30 DIAGNOSIS — E119 Type 2 diabetes mellitus without complications: Secondary | ICD-10-CM | POA: Insufficient documentation

## 2016-05-30 DIAGNOSIS — Z885 Allergy status to narcotic agent status: Secondary | ICD-10-CM | POA: Diagnosis not present

## 2016-05-30 HISTORY — DX: Cardiac murmur, unspecified: R01.1

## 2016-05-30 HISTORY — DX: Dyspnea, unspecified: R06.00

## 2016-05-30 HISTORY — PX: MULTIPLE EXTRACTIONS WITH ALVEOLOPLASTY: SHX5342

## 2016-05-30 HISTORY — DX: Other amnesia: R41.3

## 2016-05-30 HISTORY — DX: Essential (primary) hypertension: I10

## 2016-05-30 LAB — CBC
HCT: 42.1 % (ref 36.0–46.0)
Hemoglobin: 14.2 g/dL (ref 12.0–15.0)
MCH: 30.3 pg (ref 26.0–34.0)
MCHC: 33.7 g/dL (ref 30.0–36.0)
MCV: 89.8 fL (ref 78.0–100.0)
Platelets: 242 10*3/uL (ref 150–400)
RBC: 4.69 MIL/uL (ref 3.87–5.11)
RDW: 13.5 % (ref 11.5–15.5)
WBC: 8.3 10*3/uL (ref 4.0–10.5)

## 2016-05-30 LAB — BASIC METABOLIC PANEL
ANION GAP: 11 (ref 5–15)
BUN: 34 mg/dL — ABNORMAL HIGH (ref 6–20)
CALCIUM: 9.9 mg/dL (ref 8.9–10.3)
CHLORIDE: 103 mmol/L (ref 101–111)
CO2: 22 mmol/L (ref 22–32)
Creatinine, Ser: 0.86 mg/dL (ref 0.44–1.00)
GFR calc non Af Amer: >60 mL/min (ref >60)
Glucose, Bld: 194 mg/dL — ABNORMAL HIGH (ref 65–99)
Potassium: 4.4 mmol/L (ref 3.5–5.1)
SODIUM: 136 mmol/L (ref 135–145)

## 2016-05-30 LAB — GLUCOSE, CAPILLARY
Glucose-Capillary: 193 mg/dL — ABNORMAL HIGH (ref 65–99)
Glucose-Capillary: 198 mg/dL — ABNORMAL HIGH (ref 65–99)

## 2016-05-30 SURGERY — MULTIPLE EXTRACTION WITH ALVEOLOPLASTY
Anesthesia: General | Site: Mouth

## 2016-05-30 MED ORDER — 0.9 % SODIUM CHLORIDE (POUR BTL) OPTIME
TOPICAL | Status: DC | PRN
  Administered 2016-05-30: 1000 mL

## 2016-05-30 MED ORDER — ROCURONIUM BROMIDE 100 MG/10ML IV SOLN
INTRAVENOUS | Status: DC | PRN
  Administered 2016-05-30: 50 mg via INTRAVENOUS

## 2016-05-30 MED ORDER — ROCURONIUM BROMIDE 50 MG/5ML IV SOSY
PREFILLED_SYRINGE | INTRAVENOUS | Status: AC
  Filled 2016-05-30: qty 5

## 2016-05-30 MED ORDER — DEXMEDETOMIDINE HCL IN NACL 200 MCG/50ML IV SOLN
INTRAVENOUS | Status: AC
  Filled 2016-05-30: qty 50

## 2016-05-30 MED ORDER — LACTATED RINGERS IV SOLN
INTRAVENOUS | Status: DC
  Administered 2016-05-30 (×2): via INTRAVENOUS

## 2016-05-30 MED ORDER — OXYMETAZOLINE HCL 0.05 % NA SOLN
NASAL | Status: DC | PRN
  Administered 2016-05-30: 1 via NASAL

## 2016-05-30 MED ORDER — AMOXICILLIN 500 MG PO CAPS: 500.0000 mg | ORAL_CAPSULE | Freq: Three times a day (TID) | ORAL | 0 refills | Status: DC

## 2016-05-30 MED ORDER — PROPOFOL 10 MG/ML IV BOLUS
INTRAVENOUS | Status: AC
  Filled 2016-05-30: qty 20

## 2016-05-30 MED ORDER — LIDOCAINE-EPINEPHRINE 2 %-1:100000 IJ SOLN
INTRAMUSCULAR | Status: DC | PRN
  Administered 2016-05-30: 20 mL

## 2016-05-30 MED ORDER — PROPOFOL 10 MG/ML IV BOLUS
INTRAVENOUS | Status: DC | PRN
  Administered 2016-05-30: 130 mg via INTRAVENOUS

## 2016-05-30 MED ORDER — ONDANSETRON HCL 4 MG/2ML IJ SOLN
INTRAMUSCULAR | Status: AC
  Filled 2016-05-30: qty 12

## 2016-05-30 MED ORDER — EPHEDRINE SULFATE 50 MG/ML IJ SOLN
INTRAMUSCULAR | Status: DC | PRN
  Administered 2016-05-30: 15 mg via INTRAVENOUS

## 2016-05-30 MED ORDER — FENTANYL CITRATE (PF) 100 MCG/2ML IJ SOLN
INTRAMUSCULAR | Status: DC | PRN
  Administered 2016-05-30 (×3): 50 ug via INTRAVENOUS
  Administered 2016-05-30: 100 ug via INTRAVENOUS

## 2016-05-30 MED ORDER — FENTANYL CITRATE (PF) 100 MCG/2ML IJ SOLN
INTRAMUSCULAR | Status: AC
  Filled 2016-05-30: qty 2

## 2016-05-30 MED ORDER — EPHEDRINE 5 MG/ML INJ
INTRAVENOUS | Status: AC
  Filled 2016-05-30: qty 10

## 2016-05-30 MED ORDER — PHENYLEPHRINE HCL 10 MG/ML IJ SOLN
INTRAMUSCULAR | Status: DC | PRN
  Administered 2016-05-30: 80 ug via INTRAVENOUS
  Administered 2016-05-30: 120 ug via INTRAVENOUS

## 2016-05-30 MED ORDER — MIDAZOLAM HCL 2 MG/2ML IJ SOLN
INTRAMUSCULAR | Status: AC
  Filled 2016-05-30: qty 2

## 2016-05-30 MED ORDER — DEXAMETHASONE SODIUM PHOSPHATE 10 MG/ML IJ SOLN
INTRAMUSCULAR | Status: AC
  Filled 2016-05-30: qty 1

## 2016-05-30 MED ORDER — OXYCODONE-ACETAMINOPHEN 5-325 MG PO TABS: 1.0000 | ORAL_TABLET | ORAL | 0 refills | Status: DC | PRN

## 2016-05-30 MED ORDER — OXYMETAZOLINE HCL 0.05 % NA SOLN
NASAL | Status: AC
  Filled 2016-05-30: qty 15

## 2016-05-30 MED ORDER — SUGAMMADEX SODIUM 200 MG/2ML IV SOLN
INTRAVENOUS | Status: DC | PRN
  Administered 2016-05-30: 100 mg via INTRAVENOUS

## 2016-05-30 MED ORDER — FENTANYL CITRATE (PF) 100 MCG/2ML IJ SOLN
INTRAMUSCULAR | Status: AC
  Filled 2016-05-30: qty 4

## 2016-05-30 MED ORDER — SUCCINYLCHOLINE CHLORIDE 200 MG/10ML IV SOSY
PREFILLED_SYRINGE | INTRAVENOUS | Status: AC
  Filled 2016-05-30: qty 30

## 2016-05-30 MED ORDER — DEXAMETHASONE SODIUM PHOSPHATE 10 MG/ML IJ SOLN
INTRAMUSCULAR | Status: DC | PRN
  Administered 2016-05-30: 5 mg via INTRAVENOUS

## 2016-05-30 MED ORDER — SODIUM CHLORIDE 0.9 % IR SOLN
Status: DC | PRN
  Administered 2016-05-30: 1000 mL

## 2016-05-30 MED ORDER — LIDOCAINE-EPINEPHRINE 2 %-1:100000 IJ SOLN
INTRAMUSCULAR | Status: AC
  Filled 2016-05-30: qty 1

## 2016-05-30 MED ORDER — PHENYLEPHRINE 40 MCG/ML (10ML) SYRINGE FOR IV PUSH (FOR BLOOD PRESSURE SUPPORT)
PREFILLED_SYRINGE | INTRAVENOUS | Status: AC
  Filled 2016-05-30: qty 30

## 2016-05-30 MED ORDER — CEFAZOLIN SODIUM 1 G IJ SOLR
INTRAMUSCULAR | Status: DC | PRN
  Administered 2016-05-30: 2 g via INTRAMUSCULAR

## 2016-05-30 MED ORDER — MIDAZOLAM HCL 5 MG/5ML IJ SOLN
INTRAMUSCULAR | Status: DC | PRN
  Administered 2016-05-30: 2 mg via INTRAVENOUS

## 2016-05-30 MED ORDER — LIDOCAINE HCL (CARDIAC) 20 MG/ML IV SOLN
INTRAVENOUS | Status: DC | PRN
  Administered 2016-05-30: 60 mg via INTRAVENOUS

## 2016-05-30 SURGICAL SUPPLY — 30 items
BUR CROSS CUT FISSURE 1.6 (BURR) ×2 IMPLANT
BUR CROSS CUT FISSURE 1.6MM (BURR) ×1
BUR EGG ELITE 4.0 (BURR) ×2 IMPLANT
BUR EGG ELITE 4.0MM (BURR) ×1
CANISTER SUCT 3000ML PPV (MISCELLANEOUS) ×3 IMPLANT
COVER SURGICAL LIGHT HANDLE (MISCELLANEOUS) ×3 IMPLANT
CRADLE DONUT ADULT HEAD (MISCELLANEOUS) ×3 IMPLANT
DECANTER SPIKE VIAL GLASS SM (MISCELLANEOUS) ×2 IMPLANT
FLUID NSS /IRRIG 1000 ML XXX (MISCELLANEOUS) ×3 IMPLANT
GAUZE PACKING FOLDED 2  STR (GAUZE/BANDAGES/DRESSINGS) ×2
GAUZE PACKING FOLDED 2 STR (GAUZE/BANDAGES/DRESSINGS) ×1 IMPLANT
GLOVE BIO SURGEON STRL SZ 6.5 (GLOVE) ×2 IMPLANT
GLOVE BIO SURGEON STRL SZ7.5 (GLOVE) ×3 IMPLANT
GLOVE BIO SURGEONS STRL SZ 6.5 (GLOVE) ×1
GLOVE BIOGEL PI IND STRL 7.0 (GLOVE) IMPLANT
GLOVE BIOGEL PI INDICATOR 7.0 (GLOVE) ×2
GOWN STRL REUS W/ TWL LRG LVL3 (GOWN DISPOSABLE) ×1 IMPLANT
GOWN STRL REUS W/ TWL XL LVL3 (GOWN DISPOSABLE) ×1 IMPLANT
GOWN STRL REUS W/TWL LRG LVL3 (GOWN DISPOSABLE) ×3
GOWN STRL REUS W/TWL XL LVL3 (GOWN DISPOSABLE) ×3
KIT BASIN OR (CUSTOM PROCEDURE TRAY) ×3 IMPLANT
KIT ROOM TURNOVER OR (KITS) ×3 IMPLANT
NEEDLE 22X1 1/2 (OR ONLY) (NEEDLE) ×6 IMPLANT
NS IRRIG 1000ML POUR BTL (IV SOLUTION) ×3 IMPLANT
PAD ARMBOARD 7.5X6 YLW CONV (MISCELLANEOUS) ×3 IMPLANT
SUT CHROMIC 3 0 PS 2 (SUTURE) ×5 IMPLANT
SYR CONTROL 10ML LL (SYRINGE) ×5 IMPLANT
TRAY ENT MC OR (CUSTOM PROCEDURE TRAY) ×3 IMPLANT
TUBING IRRIGATION (MISCELLANEOUS) ×3 IMPLANT
YANKAUER SUCT BULB TIP NO VENT (SUCTIONS) ×3 IMPLANT

## 2016-05-30 NOTE — Op Note (Signed)
NAME:  Veronica Moses, Veronica Moses NO.:  [id number]  MEDICAL RECORD NO.:  [id number]  LOCATION:                                FACILITY:  [facility]  PHYSICIAN:  Gae Bon, M.D.  DATE OF BIRTH:  05/09/1955  DATE OF PROCEDURE:  05/30/2016 DATE OF DISCHARGE:  05/30/2016                              OPERATIVE REPORT   PREOPERATIVE DIAGNOSIS:  Nonrestorable teeth numbers 1, 3, 5, 6, 7, 8, 9, 10, 11, 12, 14, 20, 21, 22, 23, 24, 25, 26, 27, 28, 29, 30, secondary to dental caries and periodontitis.  POSTOPERATIVE DIAGNOSIS:  Nonrestorable teeth numbers 1, 3, 5, 6, 7, 8, 9, 10, 11, 12, 14, 20, 21, 22, 23, 24, 25, 26, 27, 28, 29, 30, secondary to dental caries and periodontitis.  PROCEDURE:  Extraction of teeth numbers 1, 3, 5, 6, 7, 8, 9, 10, 11, 12, 14, 20, 21, 22, 23, 24, 25, 26, 27, 28, 29, 30, and alveoplasty of right and left maxilla and mandible.  ANESTHESIA:  General, nasal intubation.  Dr. Linna Caprice, attending.  DESCRIPTION OF PROCEDURE:  The patient was taken to the operating room and placed on the table in supine position.  General anesthesia was administered intravenously and a nasal endotracheal tube was placed and secured.  The eyes were protected and the patient was draped for the procedure.  Time-out was performed.  The posterior pharynx was suctioned.  A throat pack was placed.  A 2% lidocaine with 1:100,000 epinephrine was infiltrated in an inferior alveolar block on the right and left side, and buccal and palatal infiltration of maxilla.  A total of 20 mL of solution was utilized.  A bite block was placed in the right side of the mouth and a Sweetheart retractor was used to retract the tongue.  A #15 blade was used to make an incision, beginning in the left mandible around tooth #20 and carried forward to tooth #26.  This was done, and both the buccal and gingival sulcus on the buccal and lingual sides, and then in the maxilla, a #15 blade was used to make an  incision beginning at tooth #14 and carried forward in both the buccal and palatal gingival sulcus to tooth #6 was encountered.  The periosteum was reflected from around these teeth and then the 301 elevator was used to elevate the teeth.  The dental forceps was used to extract the teeth. Then, the sockets were curetted and then the periosteum was reflected in the left mandible and left maxilla to expose the alveolar crest.  Then, an egg-shaped bur under irrigation followed by a bone file were used to perform an alveoplasty.  Then, the areas were irrigated and closed with 3-0 chromic.  The Sweetheart retractor and bite block were repositioned in either side of the mouth and a #15 blade was used to make an incision in similar fashion around teeth numbers 1, 3, 5, and 6 in the maxilla and around teeth numbers 27, 28, 29, 30 in the mandible.  The periosteum was reflected with a periosteal elevator.  The teeth were elevated with a 301 elevator and removed the mouth with a dental forceps.  Then, the  sockets were curetted.  The cystic lesion was removed consistent with periapical cyst in the area of teeth #5 and #6 were less than 1 cm in diameter.  Then, the periosteum was reflected to expose the alveolar crest and the alveoplasty was performed using the egg-shaped bur and bone file.  Then the areas were irrigated and closed with 3-0 chromic. Then, the oral cavity was irrigated and suctioned.  Throat pack was removed.  The patient was awakened and taken to the recovery room, breathing spontaneously in good condition.  ESTIMATED BLOOD LOSS:  Minimal.  COMPLICATIONS:  None.  SPECIMENS:  None.     Gae Bon, M.D.     SMJ/MEDQ  D:  05/30/2016  T:  05/30/2016  Job:  [id number]

## 2016-05-30 NOTE — Op Note (Signed)
05/30/2016  9:29 AM  PATIENT:  Veronica Moses  61 y.o. female  PRE-OPERATIVE DIAGNOSIS:  Nonrestorable teeth # 1, 3, 5, 6, 7, 8, 9, 10, 11, 12, 14, 20, 21, 22, 23, 24, 25, 26, 27, 28, 29, 30, secondary to Dental Caries, Periodontitis   POST-OPERATIVE DIAGNOSIS:  SAME  PROCEDURE:  Procedure(s): MULTIPLE EXTRACTION teeth # 1, 3, 5, 6, 7, 8, 9, 10, 11, 12, 14, 20, 21, 22, 23, 24, 25, 26, 27, 28, 29, 30, ALVEOLOPLASTY  SURGEON:  Surgeon(s): Diona Browner, DDS  ANESTHESIA:   local and general  EBL:  minimal  DRAINS: none   SPECIMEN:  No Specimen  COUNTS:  YES  PLAN OF CARE: Discharge to home after PACU  PATIENT DISPOSITION:  PACU - hemodynamically stable.   PROCEDURE DETAILS: Dictation # [id number]  Gae Bon, DMD 05/30/2016 9:29 AM

## 2016-05-30 NOTE — Anesthesia Preprocedure Evaluation (Signed)
Anesthesia Evaluation  Patient identified by MRN, date of birth, ID band Patient awake    Reviewed: Allergy & Precautions, NPO status , Patient's Chart, lab work & pertinent test results  Airway Mallampati: II  TM Distance: >3 FB Neck ROM: Full    Dental  (+) Teeth Intact, Loose,    Pulmonary former smoker,    breath sounds clear to auscultation       Cardiovascular hypertension,  Rhythm:Regular Rate:Normal     Neuro/Psych    GI/Hepatic   Endo/Other  diabetes  Renal/GU      Musculoskeletal   Abdominal   Peds  Hematology   Anesthesia Other Findings   Reproductive/Obstetrics                             Anesthesia Physical Anesthesia Plan  ASA: III  Anesthesia Plan: General   Post-op Pain Management:    Induction: Intravenous  Airway Management Planned: Nasal ETT  Additional Equipment:   Intra-op Plan:   Post-operative Plan: Extubation in OR  Informed Consent: I have reviewed the patients History and Physical, chart, labs and discussed the procedure including the risks, benefits and alternatives for the proposed anesthesia with the patient or authorized representative who has indicated his/her understanding and acceptance.     Plan Discussed with: CRNA and Anesthesiologist  Anesthesia Plan Comments: (Periodontal disease 2 loose upper teeth Hypertension  08/2015  status epilepticus  Type 1 DM per records glucose 193 COPD  Plan GA with nasal ETT )        Anesthesia Quick Evaluation

## 2016-05-30 NOTE — H&P (Signed)
HISTORY AND PHYSICAL  Veronica Moses is a 61 y.o. female patient with CC: painful teeth. Referred by general dentist for full dental extractions in preparation for dentures.  No diagnosis found.  Past Medical History:  Diagnosis Date  . COPD (chronic obstructive pulmonary disease) ([place])   . Diabetes mellitus    Insulin Dependent. Adult onset  . Dyspnea    with exertion  . Heart murmur   . Hypertension   . Memory changes    since being on vent June 2017- "coming back some."  . Seizures ([place]) 08/2015  . Stroke ([place])   . UTI (lower urinary tract infection)     Current Facility-Administered Medications  Medication Dose Route Frequency Provider Last Rate Last Dose  . lactated ringers infusion   Intravenous Continuous Roberts Gaudy, MD 10 mL/hr at 05/30/16 3825     Allergies  Allergen Reactions  . Demerol Other (See Comments)    hallucinations  . Depakote [Divalproex Sodium] Other (See Comments)    Sore/blisters  . Dilaudid [Hydromorphone Hcl] Nausea And Vomiting  . Doxycycline Other (See Comments)    "Blistered"  . Valproic Acid Rash   Active Problems:   * No active hospital problems. *  Vitals: Blood pressure (!) 131/50, pulse 92, temperature 98.3 F (36.8 C), temperature source Oral, resp. rate 20, SpO2 98 %. Lab results: Results for orders placed or performed during the hospital encounter of 05/30/16 (from the past 24 hour(s))  Glucose, capillary     Status: Abnormal   Collection Time: 05/30/16  6:23 AM  Result Value Ref Range   Glucose-Capillary 193 (H) 65 - 99 mg/dL  CBC     Status: None   Collection Time: 05/30/16  6:31 AM  Result Value Ref Range   WBC 8.3 4.0 - 10.5 K/uL   RBC 4.69 3.87 - 5.11 MIL/uL   Hemoglobin 14.2 12.0 - 15.0 g/dL   HCT 42.1 36.0 - 46.0 %   MCV 89.8 78.0 - 100.0 fL   MCH 30.3 26.0 - 34.0 pg   MCHC 33.7 30.0 - 36.0 g/dL   RDW 13.5 11.5 - 15.5 %   Platelets 242 150 - 400 K/uL  Basic metabolic panel     Status: Abnormal   Collection  Time: 05/30/16  6:31 AM  Result Value Ref Range   Sodium 136 135 - 145 mmol/L   Potassium 4.4 3.5 - 5.1 mmol/L   Chloride 103 101 - 111 mmol/L   CO2 22 22 - 32 mmol/L   Glucose, Bld 194 (H) 65 - 99 mg/dL   BUN 34 (H) 6 - 20 mg/dL   Creatinine, Ser 0.86 0.44 - 1.00 mg/dL   Calcium 9.9 8.9 - 10.3 mg/dL   GFR calc non Af Amer >60 >60 mL/min   GFR calc Af Amer >60 >60 mL/min   Anion gap 11 5 - 15   Radiology Results: No results found. General appearance: alert and no distress Head: Normocephalic, without obvious abnormality, atraumatic Eyes: negative Nose: Nares normal. Septum midline. Mucosa normal. No drainage or sinus tenderness. Throat: multiple carious teeth and retained roots. chronic generalized periodontitis. pharynx clear. Neck: no adenopathy, supple, symmetrical, trachea midline and thyroid not enlarged, symmetric, no tenderness/mass/nodules Resp: clear to auscultation bilaterally Cardio: regular rate and rhythm, S1, S2 normal, no murmur, click, rub or gallop  Assessment: All teeth nonrestorable secondary to dental caries and periodontitis Plan:Full mouth extractions with alveoloplasy. GA. Day surgery.   Gae Bon 05/30/2016

## 2016-05-30 NOTE — Op Note (Deleted)
  The note originally documented on this encounter has been moved the the encounter in which it belongs.  

## 2016-05-30 NOTE — Transfer of Care (Signed)
Immediate Anesthesia Transfer of Care Note  Patient: Veronica Moses  Procedure(s) Performed: Procedure(s): MULTIPLE EXTRACTION WITH ALVEOLOPLASTY (N/A)  Patient Location: PACU  Anesthesia Type:General  Level of Consciousness: awake and patient cooperative  Airway & Oxygen Therapy: Patient Spontanous Breathing and Patient connected to face mask oxygen  Post-op Assessment: Report given to RN and Post -op Vital signs reviewed and stable  Post vital signs: Reviewed and stable  Last Vitals:  Vitals:   05/30/16 0623  BP: (!) 131/50  Pulse: 92  Resp: 20  Temp: 36.8 C    Last Pain:  Vitals:   05/30/16 0632  TempSrc:   PainSc: 9       Patients Stated Pain Goal: 3 (93/71/69 6789)  Complications: No apparent anesthesia complications

## 2016-05-30 NOTE — Anesthesia Procedure Notes (Addendum)
Procedure Name: Intubation Date/Time: 05/30/2016 8:46 AM Performed by: Lance Coon Pre-anesthesia Checklist: Patient identified, Emergency Drugs available, Suction available and Patient being monitored Patient Re-evaluated:Patient Re-evaluated prior to inductionOxygen Delivery Method: Circle System Utilized Preoxygenation: Pre-oxygenation with 100% oxygen Intubation Type: IV induction Ventilation: Mask ventilation without difficulty Laryngoscope Size: Miller and 3 Grade View: Grade I Nasal Tubes: Right, Nasal prep performed and Nasal Rae Tube size: 7.0 mm Number of attempts: 1 Airway Equipment and Method: Stylet and Oral airway Placement Confirmation: ETT inserted through vocal cords under direct vision,  positive ETCO2 and breath sounds checked- equal and bilateral Tube secured with: Tape Dental Injury: Teeth and Oropharynx as per pre-operative assessment

## 2016-05-30 NOTE — Addendum Note (Signed)
Addendum  created 05/30/16 1457 by Josephine Igo, CRNA   Anesthesia Intra Meds edited

## 2016-05-30 NOTE — Anesthesia Postprocedure Evaluation (Addendum)
Anesthesia Post Note  Patient: Veronica Moses  Procedure(s) Performed: Procedure(s) (LRB): MULTIPLE EXTRACTION WITH ALVEOLOPLASTY (N/A)  Patient location during evaluation: PACU Anesthesia Type: General Level of consciousness: awake, awake and alert and oriented Pain management: pain level controlled Vital Signs Assessment: post-procedure vital signs reviewed and stable Respiratory status: spontaneous breathing, nonlabored ventilation and respiratory function stable Cardiovascular status: blood pressure returned to baseline Anesthetic complications: no       Last Vitals:  Vitals:   05/30/16 1026 05/30/16 1030  BP:  133/61  Pulse: 93 92  Resp: 15   Temp: 36.1 C     Last Pain:  Vitals:   05/30/16 0632  TempSrc:   PainSc: 9                  Ambreen Tufte COKER

## 2016-05-31 ENCOUNTER — Encounter (HOSPITAL_COMMUNITY): Payer: Self-pay | Admitting: Oral Surgery

## 2016-05-31 LAB — HEMOGLOBIN A1C
Hgb A1c MFr Bld: 8.6 % — ABNORMAL HIGH (ref 4.8–5.6)
Mean Plasma Glucose: 200 mg/dL

## 2016-08-31 ENCOUNTER — Emergency Department (HOSPITAL_COMMUNITY)
Admission: EM | Admit: 2016-08-31 | Discharge: 2016-08-31 | Disposition: A | Discharge status: Home / Self Care | Payer: Medicaid Other | Attending: Emergency Medicine | Admitting: Emergency Medicine

## 2016-08-31 ENCOUNTER — Encounter (HOSPITAL_COMMUNITY): Payer: Self-pay | Admitting: Emergency Medicine

## 2016-08-31 DIAGNOSIS — Z794 Long term (current) use of insulin: Secondary | ICD-10-CM | POA: Insufficient documentation

## 2016-08-31 DIAGNOSIS — Z7982 Long term (current) use of aspirin: Secondary | ICD-10-CM | POA: Diagnosis not present

## 2016-08-31 DIAGNOSIS — E119 Type 2 diabetes mellitus without complications: Secondary | ICD-10-CM | POA: Diagnosis not present

## 2016-08-31 DIAGNOSIS — Z87891 Personal history of nicotine dependence: Secondary | ICD-10-CM | POA: Diagnosis not present

## 2016-08-31 DIAGNOSIS — I10 Essential (primary) hypertension: Secondary | ICD-10-CM | POA: Diagnosis not present

## 2016-08-31 DIAGNOSIS — J449 Chronic obstructive pulmonary disease, unspecified: Secondary | ICD-10-CM | POA: Insufficient documentation

## 2016-08-31 DIAGNOSIS — R1030 Lower abdominal pain, unspecified: Secondary | ICD-10-CM | POA: Diagnosis present

## 2016-08-31 DIAGNOSIS — K529 Noninfective gastroenteritis and colitis, unspecified: Secondary | ICD-10-CM | POA: Insufficient documentation

## 2016-08-31 DIAGNOSIS — Z79899 Other long term (current) drug therapy: Secondary | ICD-10-CM | POA: Diagnosis not present

## 2016-08-31 DIAGNOSIS — E876 Hypokalemia: Secondary | ICD-10-CM | POA: Diagnosis not present

## 2016-08-31 LAB — COMPREHENSIVE METABOLIC PANEL
ALT: 28 U/L (ref 14–54)
AST: 28 U/L (ref 15–41)
Albumin: 3.8 g/dL (ref 3.5–5.0)
Alkaline Phosphatase: 30 U/L — ABNORMAL LOW (ref 38–126)
Anion gap: 11 (ref 5–15)
BILIRUBIN TOTAL: 0.8 mg/dL (ref 0.3–1.2)
BUN: 13 mg/dL (ref 6–20)
CHLORIDE: 107 mmol/L (ref 101–111)
CO2: 23 mmol/L (ref 22–32)
CREATININE: 0.75 mg/dL (ref 0.44–1.00)
Calcium: 9 mg/dL (ref 8.9–10.3)
Glucose, Bld: 229 mg/dL — ABNORMAL HIGH (ref 65–99)
POTASSIUM: 3 mmol/L — AB (ref 3.5–5.1)
Sodium: 141 mmol/L (ref 135–145)
TOTAL PROTEIN: 6.7 g/dL (ref 6.5–8.1)

## 2016-08-31 LAB — CBC WITH DIFFERENTIAL/PLATELET
BASOS ABS: 0 10*3/uL (ref 0.0–0.1)
BASOS PCT: 0 %
EOS ABS: 0.2 10*3/uL (ref 0.0–0.7)
EOS PCT: 2 %
HCT: 39.3 % (ref 36.0–46.0)
HEMOGLOBIN: 13.1 g/dL (ref 12.0–15.0)
LYMPHS ABS: 1.6 10*3/uL (ref 0.7–4.0)
Lymphocytes Relative: 15 %
MCH: 30.1 pg (ref 26.0–34.0)
MCHC: 33.3 g/dL (ref 30.0–36.0)
MCV: 90.3 fL (ref 78.0–100.0)
Monocytes Absolute: 0.5 10*3/uL (ref 0.1–1.0)
Monocytes Relative: 5 %
NEUTROS PCT: 78 %
Neutro Abs: 7.9 10*3/uL — ABNORMAL HIGH (ref 1.7–7.7)
PLATELETS: 248 10*3/uL (ref 150–400)
RBC: 4.35 MIL/uL (ref 3.87–5.11)
RDW: 14.1 % (ref 11.5–15.5)
WBC: 10.2 10*3/uL (ref 4.0–10.5)

## 2016-08-31 LAB — C DIFFICILE QUICK SCREEN W PCR REFLEX
C DIFFICLE (CDIFF) ANTIGEN: NEGATIVE
C Diff interpretation: NOT DETECTED
C Diff toxin: NEGATIVE

## 2016-08-31 LAB — LIPASE, BLOOD: LIPASE: 23 U/L (ref 11–51)

## 2016-08-31 MED ORDER — LOPERAMIDE HCL 2 MG PO CAPS: 2.0000 mg | ORAL_CAPSULE | Freq: Four times a day (QID) | ORAL | 0 refills | Status: DC | PRN

## 2016-08-31 MED ORDER — POTASSIUM CHLORIDE CRYS ER 20 MEQ PO TBCR
40.0000 meq | EXTENDED_RELEASE_TABLET | Freq: Once | ORAL | Status: AC
  Administered 2016-08-31: 40 meq via ORAL
  Filled 2016-08-31: qty 2

## 2016-08-31 MED ORDER — LOPERAMIDE HCL 2 MG PO CAPS
4.0000 mg | ORAL_CAPSULE | Freq: Once | ORAL | Status: AC
  Administered 2016-08-31: 4 mg via ORAL
  Filled 2016-08-31: qty 2

## 2016-08-31 MED ORDER — POTASSIUM CHLORIDE CRYS ER 20 MEQ PO TBCR: 20.0000 meq | EXTENDED_RELEASE_TABLET | Freq: Two times a day (BID) | ORAL | 0 refills | Status: DC

## 2016-08-31 NOTE — ED Notes (Signed)
Pt has been unable to provide adequate amount of stool sample for GI panel.

## 2016-08-31 NOTE — Discharge Instructions (Signed)
Your lab tests are ok today except for your potassium being low.  This is being replaced with the potassium supplied.  This is not the cause of your diarrhea, but may be an effect from having diarrhea.  You may use the imodium as prescribed.  Plan to see your doctor in one week for a recheck if your symptoms persist.

## 2016-08-31 NOTE — ED Triage Notes (Signed)
Patient complains of diarrhea, abdominal pain and cramping x 1 month

## 2016-08-31 NOTE — ED Provider Notes (Signed)
[facility] DEPT Provider Note   CSN: [id number] Arrival date & time: 08/31/16  0810     History   Chief Complaint Chief Complaint  Patient presents with  . Diarrhea  . Abdominal Pain    HPI Veronica Moses is a 61 y.o. female with past medical history of COPD, insulin dependent diabetes, HTN , seizure disorder and h/o cva with total dental extractions performed 6 weeks ago secondary to decay and gingivitis presenting with diarrhea which started one month ago, and shortly after completing 2 rounds of amoxil for her dental infection.  She endorses water light to dark brown stools, often times uncontrolled for the past month, stating anytime she eats or drinks anything she has to have an urgent BM.  She denies bloody or mucoid stools, states she passes "just colored water".  She denies fevers, chills, n/v and has maintained a good appetite, but believes she has lost 5 lbs since the diarrhea began.  She has lower abdominal cramping pain which improves after a bm.  She presents due to increased diarrhea, stating she had 3 episodes this am before arrival, then had to stop on the way here to go again.  She has not contacted her pcp with this problem nor has she taken any anti diarrheal medicines.   HPI  Past Medical History:  Diagnosis Date  . COPD (chronic obstructive pulmonary disease) ([place])   . Diabetes mellitus    Insulin Dependent. Adult onset  . Dyspnea    with exertion  . Heart murmur   . Hypertension   . Memory changes    since being on vent June 2017- "coming back some."  . Seizures ([place]) 08/2015  . Stroke ([place])   . UTI (lower urinary tract infection)     Patient Active Problem List   Diagnosis Date Noted  . Hypokalemia   . Psychosocial stressors   . Acute encephalopathy   . Insulin dependent diabetes mellitus ([place])   . HCAP (healthcare-associated pneumonia) 08/27/2015  . Status epilepticus ([place]) 08/20/2015  . Seizure ([place]) 08/20/2015  . DKA (diabetic ketoacidoses)  ([place]) 08/20/2015  . Fall 05/07/2014  . History of sprained wrist 05/07/2014  . Protein-calorie malnutrition, severe ([place]) 10/25/2013  . Numbness, limb 10/24/2013  . Chest pain 05/30/2013  . Diabetes ([place]) 02/08/2013  . COPD (chronic obstructive pulmonary disease) ([place]) 02/08/2013  . GERD (gastroesophageal reflux disease) 02/08/2013  . Abdominal pain, chronic, epigastric 02/08/2013    Past Surgical History:  Procedure Laterality Date  . ABDOMINAL HYSTERECTOMY    . BREAST SURGERY Left    cyst-  . CHOLECYSTECTOMY    . COLONOSCOPY WITH ESOPHAGOGASTRODUODENOSCOPY (EGD) N/A 03/04/2013   Procedure: COLONOSCOPY WITH ESOPHAGOGASTRODUODENOSCOPY (EGD);  Surgeon: Rogene Houston, MD;  Location: [facility];  Service: Endoscopy;  Laterality: N/A;  1200-moved to [place] notified pt  . MULTIPLE EXTRACTIONS WITH ALVEOLOPLASTY N/A 05/30/2016   Procedure: MULTIPLE EXTRACTION WITH ALVEOLOPLASTY;  Surgeon: Diona Browner, DDS;  Location: [facility];  Service: Oral Surgery;  Laterality: N/A;  . 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 Authorizing Provider  aspirin 81 MG chewable tablet Chew 81 mg by mouth daily.   Yes [provider]  atorvastatin (LIPITOR) 20 MG tablet Take 20 mg by mouth daily.   Yes [provider]  FLUoxetine (PROZAC) 20 MG capsule Take 20 mg by mouth daily. 04/07/16 10/04/16 Yes [provider]  furosemide (LASIX) 40 MG tablet Take 40 mg by mouth daily.   Yes [provider]  gabapentin (NEURONTIN) 100 MG capsule Take 100 mg by mouth at bedtime.   Yes [provider]  insulin glargine (LANTUS) 100 UNIT/ML injection Inject 14 Units into the skin daily.    Yes [provider]  lacosamide (VIMPAT) 200 MG TABS tablet Take 200 mg by mouth 2 (two) times daily.   Yes [provider]    levETIRAcetam (KEPPRA) 1000 MG tablet Take 1,000 mg by mouth 2 (two) times daily. 02/20/16 02/19/17 Yes [provider]  lisinopril (PRINIVIL,ZESTRIL) 10 MG tablet Take 10 mg by mouth daily.   Yes [provider]  NOVOLOG FLEXPEN 100 UNIT/ML FlexPen Please use the hospital sliding scale provided for you Patient taking differently: Please use the hospital sliding scale provided for you take with each meal 08/25/15  Yes Sinda Du, MD  RESTASIS 0.05 % ophthalmic emulsion Apply 1 drop to eye 2 (two) times daily. 04/29/14  Yes [provider]  loperamide (IMODIUM) 2 MG capsule Take 1 capsule (2 mg total) by mouth 4 (four) times daily as needed for diarrhea or loose stools. 08/31/16   Evalee Jefferson, PA-C  oxyCODONE-acetaminophen (PERCOCET) 5-325 MG tablet Take 1-2 tablets by mouth every 4 (four) hours as needed. Patient not taking: Reported on 08/31/2016 05/30/16   Diona Browner, DDS  potassium chloride SA (K-DUR,KLOR-CON) 20 MEQ tablet Take 1 tablet (20 mEq total) by mouth 2 (two) times daily. 08/31/16 09/05/16  Evalee Jefferson, PA-C    Family History Family History  Problem Relation Age of Onset  . Diabetes Father   . CAD Unknown        family history  . Diabetes Brother   . Colon cancer Neg Hx     Social History Social History  Substance Use Topics  . Smoking status: Former Smoker    Packs/day: 1.00    Years: 26.00    Types: Cigarettes    Quit date: 08/27/2015  . Smokeless tobacco: Never Used  . Alcohol use No     Allergies   Demerol; Depakote [divalproex sodium]; Dilaudid [hydromorphone hcl]; Doxycycline; and Valproic acid   Review of Systems Review of Systems  Constitutional: Negative for chills and fever.  HENT: Negative for congestion and sore throat.   Eyes: Negative.   Respiratory: Negative for chest tightness and shortness of breath.   Cardiovascular: Negative for chest pain.  Gastrointestinal: Positive for abdominal pain and diarrhea. Negative for  nausea and vomiting.  Genitourinary: Negative.   Musculoskeletal: Negative for arthralgias, joint swelling and neck pain.  Skin: Negative.  Negative for rash and wound.  Neurological: Negative for dizziness, weakness, light-headedness, numbness and headaches.  Psychiatric/Behavioral: Negative.      Physical Exam Updated Vital Signs BP (!) 161/66 (BP Location: Left Arm)   Pulse 74   Temp 98.3 F (36.8 C) (Oral)   Resp 16   Ht 5\' 3"  (1.6 m)   Wt 50.3 kg (111 lb)   SpO2 95%   BMI 19.66 kg/m   Physical Exam  Constitutional: She appears well-developed and well-nourished.  HENT:  Head: Normocephalic and atraumatic.  Eyes: Conjunctivae are normal.  Neck: Normal range of motion.  Cardiovascular: Normal rate, regular rhythm, normal heart sounds and intact distal pulses.   Pulmonary/Chest: Effort normal and breath sounds normal. She has no wheezes.  Abdominal:  Soft. She exhibits no distension. Bowel sounds are increased. There is no hepatosplenomegaly. There is no tenderness.  Increased bowel sounds lower abdomen. Abdomen is soft.  Musculoskeletal: Normal range of motion.  Neurological: She is alert.  Skin: Skin is warm and dry.  Psychiatric: She has a normal mood and affect.  Nursing note and vitals reviewed.    ED Treatments / Results  Labs (all labs ordered are listed, but only abnormal results are displayed) Labs Reviewed  COMPREHENSIVE METABOLIC PANEL - Abnormal; Notable for the following:       Result Value   Potassium 3.0 (*)    Glucose, Bld 229 (*)    Alkaline Phosphatase 30 (*)    All other components within normal limits  CBC WITH DIFFERENTIAL/PLATELET - Abnormal; Notable for the following:    Neutro Abs 7.9 (*)    All other components within normal limits  C DIFFICILE QUICK SCREEN W PCR REFLEX  GASTROINTESTINAL PANEL BY PCR, STOOL (REPLACES STOOL CULTURE)  LIPASE, BLOOD  URINALYSIS, ROUTINE W REFLEX MICROSCOPIC    EKG  EKG Interpretation None        Radiology No results found.  Procedures Procedures (including critical care time)  Medications Ordered in ED Medications  potassium chloride SA (K-DUR,KLOR-CON) CR tablet 40 mEq (40 mEq Oral Given 08/31/16 1023)  loperamide (IMODIUM) capsule 4 mg (4 mg Oral Given 08/31/16 1034)     Initial Impression / Assessment and Plan / ED Course  I have reviewed the triage vital signs and the nursing notes.  Pertinent labs & imaging results that were available during my care of the patient were reviewed by me and considered in my medical decision making (see chart for details).     Labs reviewed, stable except for hypokalemia.  This was replaced, prescription for 5 day course also given.  Imodium.  C dif negative.  Pt was unable to provide enough stool for remaining GI culture order.  She will be sent home with bottles to collect and take to lab.  Plan f/u with pcp in one week for a recheck if sx persist.   Final Clinical Impressions(s) / ED Diagnoses   Final diagnoses:  Chronic diarrhea  Hypokalemia    New Prescriptions New Prescriptions   LOPERAMIDE (IMODIUM) 2 MG CAPSULE    Take 1 capsule (2 mg total) by mouth 4 (four) times daily as needed for diarrhea or loose stools.   POTASSIUM CHLORIDE SA (K-DUR,KLOR-CON) 20 MEQ TABLET    Take 1 tablet (20 mEq total) by mouth 2 (two) times daily.     Evalee Jefferson, PA-C 08/31/16 1056    Julianne Rice, MD 09/02/16 1944

## 2016-09-01 LAB — GASTROINTESTINAL PANEL BY PCR, STOOL (REPLACES STOOL CULTURE)
Adenovirus F40/41: NOT DETECTED
Astrovirus: NOT DETECTED
CRYPTOSPORIDIUM: NOT DETECTED
CYCLOSPORA CAYETANENSIS: NOT DETECTED
Campylobacter species: NOT DETECTED
ENTEROAGGREGATIVE E COLI (EAEC): NOT DETECTED
ENTEROPATHOGENIC E COLI (EPEC): NOT DETECTED
Entamoeba histolytica: NOT DETECTED
Enterotoxigenic E coli (ETEC): NOT DETECTED
GIARDIA LAMBLIA: NOT DETECTED
Norovirus GI/GII: NOT DETECTED
Plesimonas shigelloides: NOT DETECTED
ROTAVIRUS A: NOT DETECTED
SALMONELLA SPECIES: NOT DETECTED
SHIGELLA/ENTEROINVASIVE E COLI (EIEC): NOT DETECTED
Sapovirus (I, II, IV, and V): NOT DETECTED
Shiga like toxin producing E coli (STEC): NOT DETECTED
VIBRIO SPECIES: NOT DETECTED
Vibrio cholerae: NOT DETECTED
YERSINIA ENTEROCOLITICA: NOT DETECTED

## 2016-10-08 ENCOUNTER — Ambulatory Visit (HOSPITAL_COMMUNITY)
Admission: RE | Admit: 2016-10-08 | Discharge: 2016-10-08 | Disposition: A | Discharge status: Home / Self Care | Payer: Medicaid Other | Source: Ambulatory Visit | Attending: Pulmonary Disease | Admitting: Pulmonary Disease

## 2016-10-08 ENCOUNTER — Other Ambulatory Visit (HOSPITAL_COMMUNITY): Payer: Self-pay | Admitting: Pulmonary Disease

## 2016-10-08 DIAGNOSIS — I10 Essential (primary) hypertension: Secondary | ICD-10-CM | POA: Diagnosis not present

## 2016-10-08 DIAGNOSIS — I080 Rheumatic disorders of both mitral and aortic valves: Secondary | ICD-10-CM | POA: Diagnosis not present

## 2016-10-08 DIAGNOSIS — I420 Dilated cardiomyopathy: Secondary | ICD-10-CM | POA: Insufficient documentation

## 2016-10-08 LAB — ECHOCARDIOGRAM COMPLETE
AO mean calculated velocity dopler: 126 cm/s
AOVTI: 42.1 cm
AV Area VTI index: 1.27 cm2/m2
AV Area VTI: 1.96 cm2
AV Mean grad: 8 mmHg
AV Peak grad: 17 mmHg
AV VEL mean LVOT/AV: 0.98
AV area mean vel ind: 1.33 cm2/m2
AV vel: 1.89
AVA: 1.89 cm2
AVAREAMEANV: 1.98 cm2
AVPKVEL: 207 cm/s
Ao pk vel: 0.98 m/s
CHL CUP AV PEAK INDEX: 1.31
CHL CUP DOP CALC LVOT VTI: 39.5 cm
E decel time: 173 msec
E/e' ratio: 9.12
FS: 37 % (ref 28–44)
IV/PV OW: 1.14
LA ID, A-P, ES: 35 mm
LA diam end sys: 35 mm
LA diam index: 2.34 cm/m2
LA vol index: 35.7 mL/m2
LAVOL: 53.3 mL
LAVOLA4C: 56.5 mL
LV E/e' medial: 9.12
LV SIMPSON'S DISK: 62
LV dias vol: 64 mL (ref 46–106)
LVDIAVOLIN: 43 mL/m2
LVEEAVG: 9.12
LVELAT: 11.3 cm/s
LVOT SV: 79 mL
LVOT area: 2.01 cm2
LVOT diameter: 16 mm
LVOT peak VTI: 0.94 cm
LVOT peak grad rest: 16 mmHg
LVOTPV: 202 cm/s
LVSYSVOL: 24 mL
LVSYSVOLIN: 16 mL/m2
Lateral S' vel: 11.1 cm/s
MV Dec: 173
MV pk E vel: 103 m/s
MVPG: 4 mmHg
MVPKAVEL: 80.6 m/s
P 1/2 time: 354 ms
PW: 10.7 mm — AB (ref 0.6–1.1)
RV sys press: 31 mmHg
Reg peak vel: 241 cm/s
Stroke v: 40 ml
TAPSE: 24 mm
TDI e' lateral: 11.3
TDI e' medial: 8.92
TRMAXVEL: 241 cm/s
Valve area index: 1.27

## 2016-10-08 NOTE — Progress Notes (Signed)
*  PRELIMINARY RESULTS* Echocardiogram 2D Echocardiogram has been performed.  Veronica Moses 10/08/2016, 11:22 AM

## 2016-10-14 ENCOUNTER — Telehealth (HOSPITAL_COMMUNITY): Payer: Self-pay | Admitting: *Deleted

## 2016-10-14 NOTE — Telephone Encounter (Signed)
left voice message regarding appointment. 
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
## 2016-10-29 ENCOUNTER — Ambulatory Visit (INDEPENDENT_AMBULATORY_CARE_PROVIDER_SITE_OTHER): Payer: Medicaid Other | Admitting: Psychiatry

## 2016-10-29 ENCOUNTER — Encounter (HOSPITAL_COMMUNITY): Payer: Self-pay | Admitting: Psychiatry

## 2016-10-29 VITALS — BP 157/77 | HR 75 | Ht 63.0 in | Wt 115.4 lb

## 2016-10-29 DIAGNOSIS — Z818 Family history of other mental and behavioral disorders: Secondary | ICD-10-CM | POA: Diagnosis not present

## 2016-10-29 DIAGNOSIS — Z87891 Personal history of nicotine dependence: Secondary | ICD-10-CM | POA: Diagnosis not present

## 2016-10-29 DIAGNOSIS — Z814 Family history of other substance abuse and dependence: Secondary | ICD-10-CM

## 2016-10-29 DIAGNOSIS — Z79899 Other long term (current) drug therapy: Secondary | ICD-10-CM

## 2016-10-29 DIAGNOSIS — F331 Major depressive disorder, recurrent, moderate: Secondary | ICD-10-CM | POA: Diagnosis not present

## 2016-10-29 DIAGNOSIS — F4310 Post-traumatic stress disorder, unspecified: Secondary | ICD-10-CM

## 2016-10-29 MED ORDER — FLUOXETINE HCL 40 MG PO CAPS: 40.0000 mg | ORAL_CAPSULE | Freq: Every day | ORAL | 1 refills | Status: DC

## 2016-10-29 NOTE — Patient Instructions (Signed)
1. Increase fluoxetine 40 mg daily 2. Referral to therapy 3. Return to clinic in one month for 30 mins

## 2016-10-30 ENCOUNTER — Emergency Department (HOSPITAL_COMMUNITY): Payer: Medicaid Other

## 2016-10-30 ENCOUNTER — Encounter (HOSPITAL_COMMUNITY): Payer: Self-pay | Admitting: Emergency Medicine

## 2016-10-30 ENCOUNTER — Emergency Department (HOSPITAL_COMMUNITY)
Admission: EM | Admit: 2016-10-30 | Discharge: 2016-10-30 | Disposition: A | Discharge status: Home / Self Care | Payer: Medicaid Other | Attending: Emergency Medicine | Admitting: Emergency Medicine

## 2016-10-30 ENCOUNTER — Ambulatory Visit (HOSPITAL_COMMUNITY): Payer: Self-pay | Admitting: Licensed Clinical Social Worker

## 2016-10-30 DIAGNOSIS — I10 Essential (primary) hypertension: Secondary | ICD-10-CM | POA: Insufficient documentation

## 2016-10-30 DIAGNOSIS — Z794 Long term (current) use of insulin: Secondary | ICD-10-CM | POA: Insufficient documentation

## 2016-10-30 DIAGNOSIS — R51 Headache: Secondary | ICD-10-CM

## 2016-10-30 DIAGNOSIS — G44209 Tension-type headache, unspecified, not intractable: Secondary | ICD-10-CM | POA: Insufficient documentation

## 2016-10-30 DIAGNOSIS — Z79899 Other long term (current) drug therapy: Secondary | ICD-10-CM | POA: Insufficient documentation

## 2016-10-30 DIAGNOSIS — Z7982 Long term (current) use of aspirin: Secondary | ICD-10-CM | POA: Diagnosis not present

## 2016-10-30 DIAGNOSIS — J449 Chronic obstructive pulmonary disease, unspecified: Secondary | ICD-10-CM | POA: Diagnosis not present

## 2016-10-30 DIAGNOSIS — E119 Type 2 diabetes mellitus without complications: Secondary | ICD-10-CM | POA: Diagnosis not present

## 2016-10-30 DIAGNOSIS — R519 Headache, unspecified: Secondary | ICD-10-CM

## 2016-10-30 DIAGNOSIS — Z87891 Personal history of nicotine dependence: Secondary | ICD-10-CM | POA: Insufficient documentation

## 2016-10-30 LAB — CBC WITH DIFFERENTIAL/PLATELET
BASOS PCT: 1 %
Basophils Absolute: 0 10*3/uL (ref 0.0–0.1)
EOS PCT: 2 %
Eosinophils Absolute: 0.2 10*3/uL (ref 0.0–0.7)
HCT: 41.4 % (ref 36.0–46.0)
Hemoglobin: 13.8 g/dL (ref 12.0–15.0)
LYMPHS PCT: 31 %
Lymphs Abs: 2.4 10*3/uL (ref 0.7–4.0)
MCH: 30.4 pg (ref 26.0–34.0)
MCHC: 33.3 g/dL (ref 30.0–36.0)
MCV: 91.2 fL (ref 78.0–100.0)
MONO ABS: 0.6 10*3/uL (ref 0.1–1.0)
MONOS PCT: 8 %
NEUTROS ABS: 4.6 10*3/uL (ref 1.7–7.7)
Neutrophils Relative %: 58 %
PLATELETS: 252 10*3/uL (ref 150–400)
RBC: 4.54 MIL/uL (ref 3.87–5.11)
RDW: 13.5 % (ref 11.5–15.5)
WBC: 7.8 10*3/uL (ref 4.0–10.5)

## 2016-10-30 LAB — COMPREHENSIVE METABOLIC PANEL
ALT: 37 U/L (ref 14–54)
ANION GAP: 8 (ref 5–15)
AST: 31 U/L (ref 15–41)
Albumin: 3.9 g/dL (ref 3.5–5.0)
Alkaline Phosphatase: 45 U/L (ref 38–126)
BILIRUBIN TOTAL: 0.6 mg/dL (ref 0.3–1.2)
BUN: 11 mg/dL (ref 6–20)
CHLORIDE: 102 mmol/L (ref 101–111)
CO2: 31 mmol/L (ref 22–32)
Calcium: 9.1 mg/dL (ref 8.9–10.3)
Creatinine, Ser: 0.75 mg/dL (ref 0.44–1.00)
GFR calc Af Amer: >60 mL/min (ref >60)
Glucose, Bld: 238 mg/dL — ABNORMAL HIGH (ref 65–99)
POTASSIUM: 4 mmol/L (ref 3.5–5.1)
Sodium: 141 mmol/L (ref 135–145)
Total Protein: 6.6 g/dL (ref 6.5–8.1)

## 2016-10-30 LAB — TROPONIN I: Troponin I: <0.03 ng/mL (ref <0.03)

## 2016-10-30 MED ORDER — IPRATROPIUM-ALBUTEROL 0.5-2.5 (3) MG/3ML IN SOLN
3.0000 mL | Freq: Once | RESPIRATORY_TRACT | Status: AC
  Administered 2016-10-30: 3 mL via RESPIRATORY_TRACT
  Filled 2016-10-30: qty 3

## 2016-10-30 MED ORDER — ONDANSETRON HCL 4 MG/2ML IJ SOLN
4.0000 mg | Freq: Once | INTRAMUSCULAR | Status: AC
  Administered 2016-10-30: 4 mg via INTRAVENOUS
  Filled 2016-10-30: qty 2

## 2016-10-30 MED ORDER — LORAZEPAM 2 MG/ML IJ SOLN
1.0000 mg | Freq: Once | INTRAMUSCULAR | Status: AC
  Administered 2016-10-30: 1 mg via INTRAVENOUS
  Filled 2016-10-30: qty 1

## 2016-10-30 MED ORDER — CLONIDINE HCL 0.1 MG PO TABS: ORAL_TABLET | ORAL | 0 refills | Status: DC

## 2016-10-30 MED ORDER — CLONIDINE HCL 0.2 MG PO TABS
0.2000 mg | ORAL_TABLET | Freq: Once | ORAL | Status: AC
  Administered 2016-10-30: 0.2 mg via ORAL
  Filled 2016-10-30: qty 1

## 2016-10-30 NOTE — ED Notes (Signed)
ED Provider at bedside. 

## 2016-10-30 NOTE — ED Provider Notes (Signed)
[facility] DEPT Provider Note   CSN: [id number] Arrival date & time: 10/30/16  1133     History   Chief Complaint Chief Complaint  Patient presents with  . Hypertension    HPI Veronica Moses is a 61 y.o. female.  Patient is a 62 year old female with history of hypertension, solitary kidney, peripheral artery disease, and seizures. She presents today for evaluation of elevated blood pressure, headache, and weakness. She was found to have elevated blood pressure at a neurology appointment last week. She was told to follow-up with her doctor who in turn increased her dose of lisinopril. Today she began to feel poorly and presents for evaluation. She denies any chest pain or difficulty breathing. She denies any fevers or chills.   The history is provided by the patient.  Hypertension  This is a new problem. Episode onset: One week ago. The problem occurs constantly. The problem has been rapidly worsening. Associated symptoms include headaches. Pertinent negatives include no chest pain and no shortness of breath. Nothing aggravates the symptoms. Nothing relieves the symptoms. Treatments tried: Increased lisinopril. The treatment provided no relief.    Past Medical History:  Diagnosis Date  . COPD (chronic obstructive pulmonary disease) ([place])   . Diabetes mellitus    Insulin Dependent. Adult onset  . Dyspnea    with exertion  . Heart murmur   . Hypertension   . Memory changes    since being on vent June 2017- "coming back some."  . Seizures ([place]) 08/2015  . Stroke ([place])   . UTI (lower urinary tract infection)     Patient Active Problem List   Diagnosis Date Noted  . MDD (major depressive disorder), recurrent episode, moderate ([place]) 10/29/2016  . Hypokalemia   . Psychosocial stressors   . Acute encephalopathy   . Insulin dependent diabetes mellitus ([place])   . HCAP (healthcare-associated pneumonia) 08/27/2015  . Status epilepticus ([place]) 08/20/2015  . Seizure ([place]) 08/20/2015   . DKA (diabetic ketoacidoses) ([employer]) 08/20/2015  . Fall 05/07/2014  . History of sprained wrist 05/07/2014  . Protein-calorie malnutrition, severe ([place]) 10/25/2013  . Numbness, limb 10/24/2013  . Chest pain 05/30/2013  . Diabetes ([place]) 02/08/2013  . COPD (chronic obstructive pulmonary disease) ([place]) 02/08/2013  . GERD (gastroesophageal reflux disease) 02/08/2013  . Abdominal pain, chronic, epigastric 02/08/2013    Past Surgical History:  Procedure Laterality Date  . ABDOMINAL HYSTERECTOMY    . BREAST SURGERY Left    cyst-  . CHOLECYSTECTOMY    . COLONOSCOPY WITH ESOPHAGOGASTRODUODENOSCOPY (EGD) N/A 03/04/2013   Procedure: COLONOSCOPY WITH ESOPHAGOGASTRODUODENOSCOPY (EGD);  Surgeon: Rogene Houston, MD;  [facility];  Service: Endoscopy;  Laterality: N/A;  1200-moved to [place] notified pt  . MULTIPLE EXTRACTIONS WITH ALVEOLOPLASTY N/A 05/30/2016   Procedure: MULTIPLE EXTRACTION WITH ALVEOLOPLASTY;  Surgeon: Diona Browner, DDS;  Location: [facility];  Service: Oral Surgery;  Laterality: N/A;  . 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 Authorizing Provider  amLODipine (NORVASC) 5 MG tablet Take 5 mg by mouth daily.    [provider]  aspirin 81 MG chewable tablet Chew 81 mg by mouth daily.    [provider]  atorvastatin (LIPITOR) 20 MG tablet Take 20 mg by mouth daily.    [provider]  FLUoxetine (  PROZAC) 40 MG capsule Take 1 capsule (40 mg total) by mouth daily. 10/29/16 05/22/17  Norman Clay, MD  furosemide (LASIX) 40 MG tablet Take 40 mg by mouth daily.    [provider]  gabapentin (NEURONTIN) 100 MG capsule Take 100 mg by mouth at bedtime.    [provider]  insulin glargine (LANTUS) 100 UNIT/ML injection Inject 15 Units into the skin daily.     [provider]    lacosamide (VIMPAT) 200 MG TABS tablet Take 200 mg by mouth 2 (two) times daily.    [provider]  levETIRAcetam (KEPPRA) 1000 MG tablet Take 1,000 mg by mouth 2 (two) times daily. 02/20/16 02/19/17  [provider]  lisinopril (PRINIVIL,ZESTRIL) 40 MG tablet Take 40 mg by mouth daily.    [provider]  loperamide (IMODIUM) 2 MG capsule Take 1 capsule (2 mg total) by mouth 4 (four) times daily as needed for diarrhea or loose stools. 08/31/16   Idol, Almyra Free, PA-C  NOVOLOG FLEXPEN 100 UNIT/ML FlexPen Please use the hospital sliding scale provided for you Patient taking differently: Please use the hospital sliding scale provided for you take with each meal 08/25/15   Sinda Du, MD  RESTASIS 0.05 % ophthalmic emulsion Apply 1 drop to eye 2 (two) times daily. 04/29/14   [provider]    Family History Family History  Problem Relation Age of Onset  . Diabetes Father   . CAD Unknown        family history  . Diabetes Brother   . Colon cancer Neg Hx     Social History Social History  Substance Use Topics  . Smoking status: Former Smoker    Packs/day: 1.00    Years: 26.00    Types: Cigarettes    Quit date: 08/27/2015  . Smokeless tobacco: Never Used     Comment: 10-29-2016 Per pt   . Alcohol use No     Comment: 10-29-2016 per pt use to not no more     Allergies   Demerol; Depakote [divalproex sodium]; Dilaudid [hydromorphone hcl]; Doxycycline; and Valproic acid   Review of Systems Review of Systems  Respiratory: Negative for shortness of breath.   Cardiovascular: Negative for chest pain.  Neurological: Positive for headaches.  All other systems reviewed and are negative.    Physical Exam Updated Vital Signs BP (!) 171/56   Pulse 85   Temp 98.9 F (37.2 C)   Resp 18   Ht 5\' 1"  (1.549 m)   Wt 53.1 kg (117 lb)   SpO2 96%   BMI 22.11 kg/m   Physical Exam  Constitutional: She is oriented to person, place, and time. She appears  well-developed and well-nourished. No distress.  HENT:  Head: Normocephalic and atraumatic.  Mouth/Throat: Oropharynx is clear and moist.  Eyes: Pupils are equal, round, and reactive to light. EOM are normal.  Neck: Normal range of motion. Neck supple.  Cardiovascular: Normal rate and regular rhythm.  Exam reveals no gallop and no friction rub.   No murmur heard. Pulmonary/Chest: Effort normal and breath sounds normal. No respiratory distress. She has no wheezes.  Abdominal: Soft. Bowel sounds are normal. She exhibits no distension. There is no tenderness.  Musculoskeletal: Normal range of motion.  Neurological: She is alert and oriented to person, place, and time. No cranial nerve deficit. She exhibits normal muscle tone. Coordination normal.  Skin: Skin is warm and dry. She is not diaphoretic.  Nursing note and vitals reviewed.  ED Treatments / Results  Labs (all labs ordered are listed, but only abnormal results are displayed) Labs Reviewed  CBC WITH DIFFERENTIAL/PLATELET  COMPREHENSIVE METABOLIC PANEL  TROPONIN I    EKG  EKG Interpretation None       Radiology No results found.  Procedures Procedures (including critical care time)  Medications Ordered in ED Medications  cloNIDine (CATAPRES) tablet 0.2 mg (not administered)     Initial Impression / Assessment and Plan / ED Course  I have reviewed the triage vital signs and the nursing notes.  Pertinent labs & imaging results that were available during my care of the patient were reviewed by me and considered in my medical decision making (see chart for details).  Patient is a 61 year old female with past medical history of complicated seizure disorder presenting with complaints of headache and not feeling well. Her neurologic exam is nonfocal with the exception of her being somewhat somnolent. Her head CT was negative and initial laboratory studies were unremarkable.  After long discussion with the family, the  decision was made to perform an MRI to rule out the possibility of CVA. This was performed and also was negative. An MRA was obtained revealing no significant vessel occlusion.  She is now feeling better. I see no indication for admission at this time. She will be discharged with instructions to continue her home medications and return as needed for any problems.  Final Clinical Impressions(s) / ED Diagnoses   Final diagnoses:  None    New Prescriptions New Prescriptions   No medications on file     Veryl Speak, MD 10/30/16 1851

## 2016-10-30 NOTE — ED Notes (Signed)
Pt returned from CT °

## 2016-10-30 NOTE — ED Notes (Signed)
NT Bullins reported that pt was unsteady with ambulation

## 2016-10-30 NOTE — ED Triage Notes (Signed)
Pt c/o elevated bp x 1 week. Pt reports intermittent ha. Denies cp/blurred vision.

## 2016-10-30 NOTE — ED Notes (Signed)
Patient transported to CT 

## 2016-10-30 NOTE — ED Notes (Signed)
Pt ambulated with staff she was unsteady on her feet, walking sideways, had to stop several times to rest. Pt walked back to room and is resting in bed.

## 2016-10-30 NOTE — ED Notes (Signed)
Daughter concerned about pt and requesting chest xray due to cough has gotten worse since pt has been here, EDP is aware and spoke with pt, EDP ordered chest xray and recommends a LP, pt verbally refused LP, pt's daughter and sister in room and are speaking with pt now

## 2016-10-30 NOTE — Discharge Instructions (Signed)
Continue your medications as before.  Return to the emergency department if you develop worsening headache, increased weakness, high fevers, or other new and concerning symptoms.  Clonidine as prescribed as needed for blood pressure greater than 735 systolic.  Keep a record of your blood pressure and take this with you to your next doctor's appointment for review.

## 2016-11-06 NOTE — Addendum Note (Signed)
Addendum  created 11/06/16 1143 by Roberts Gaudy, MD   Sign clinical note

## 2016-11-21 ENCOUNTER — Other Ambulatory Visit (HOSPITAL_COMMUNITY)
Admission: RE | Admit: 2016-11-21 | Discharge: 2016-11-21 | Disposition: A | Discharge status: Home / Self Care | Payer: Medicaid Other | Source: Ambulatory Visit | Attending: Pulmonary Disease | Admitting: Pulmonary Disease

## 2016-11-21 DIAGNOSIS — E1121 Type 2 diabetes mellitus with diabetic nephropathy: Secondary | ICD-10-CM | POA: Insufficient documentation

## 2016-11-21 DIAGNOSIS — I10 Essential (primary) hypertension: Secondary | ICD-10-CM | POA: Diagnosis present

## 2016-11-21 DIAGNOSIS — J449 Chronic obstructive pulmonary disease, unspecified: Secondary | ICD-10-CM | POA: Insufficient documentation

## 2016-11-21 DIAGNOSIS — F419 Anxiety disorder, unspecified: Secondary | ICD-10-CM | POA: Insufficient documentation

## 2016-11-21 LAB — COMPREHENSIVE METABOLIC PANEL WITH GFR
ALT: 24 U/L (ref 14–54)
AST: 29 U/L (ref 15–41)
Albumin: 3.9 g/dL (ref 3.5–5.0)
Alkaline Phosphatase: 31 U/L — ABNORMAL LOW (ref 38–126)
Anion gap: 10 (ref 5–15)
BUN: 9 mg/dL (ref 6–20)
CO2: 30 mmol/L (ref 22–32)
Calcium: 8.8 mg/dL — ABNORMAL LOW (ref 8.9–10.3)
Chloride: 100 mmol/L — ABNORMAL LOW (ref 101–111)
Creatinine, Ser: 0.64 mg/dL (ref 0.44–1.00)
GFR calc Af Amer: >60 mL/min
GFR calc non Af Amer: >60 mL/min
Glucose, Bld: 62 mg/dL — ABNORMAL LOW (ref 65–99)
Potassium: 2.3 mmol/L — CL (ref 3.5–5.1)
Sodium: 140 mmol/L (ref 135–145)
Total Bilirubin: 0.5 mg/dL (ref 0.3–1.2)
Total Protein: 6.6 g/dL (ref 6.5–8.1)

## 2016-11-21 LAB — LIPID PANEL
Cholesterol: 131 mg/dL (ref 0–200)
HDL: 51 mg/dL
LDL Cholesterol: 74 mg/dL (ref 0–99)
Total CHOL/HDL Ratio: 2.6 ratio
Triglycerides: 29 mg/dL
VLDL: 6 mg/dL (ref 0–40)

## 2016-11-22 LAB — HEMOGLOBIN A1C
Hgb A1c MFr Bld: 7.4 % — ABNORMAL HIGH (ref 4.8–5.6)
MEAN PLASMA GLUCOSE: 165.68 mg/dL
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
## 2016-11-27 ENCOUNTER — Other Ambulatory Visit (HOSPITAL_COMMUNITY)
Admission: RE | Admit: 2016-11-27 | Discharge: 2016-11-27 | Disposition: A | Discharge status: Home / Self Care | Payer: Medicaid Other | Source: Ambulatory Visit | Attending: Pulmonary Disease | Admitting: Pulmonary Disease

## 2016-11-27 ENCOUNTER — Ambulatory Visit (HOSPITAL_COMMUNITY): Payer: Self-pay | Admitting: Psychiatry

## 2016-11-27 DIAGNOSIS — E876 Hypokalemia: Secondary | ICD-10-CM | POA: Insufficient documentation

## 2016-11-27 LAB — POTASSIUM: Potassium: 2.7 mmol/L — CL (ref 3.5–5.1)

## 2016-12-03 ENCOUNTER — Other Ambulatory Visit (HOSPITAL_COMMUNITY)
Admission: RE | Admit: 2016-12-03 | Discharge: 2016-12-03 | Disposition: A | Discharge status: Home / Self Care | Payer: Medicaid Other | Source: Ambulatory Visit | Attending: Pulmonary Disease | Admitting: Pulmonary Disease

## 2016-12-03 DIAGNOSIS — E876 Hypokalemia: Secondary | ICD-10-CM | POA: Diagnosis not present

## 2016-12-03 LAB — POTASSIUM: POTASSIUM: 4.1 mmol/L (ref 3.5–5.1)

## 2016-12-23 ENCOUNTER — Encounter (HOSPITAL_COMMUNITY): Payer: Self-pay | Admitting: Psychiatry

## 2017-06-05 ENCOUNTER — Emergency Department (HOSPITAL_COMMUNITY): Payer: Medicaid Other

## 2017-06-05 ENCOUNTER — Emergency Department (HOSPITAL_COMMUNITY)
Admission: EM | Admit: 2017-06-05 | Discharge: 2017-06-06 | Disposition: A | Discharge status: Home / Self Care | Payer: Medicaid Other | Attending: Emergency Medicine | Admitting: Emergency Medicine

## 2017-06-05 ENCOUNTER — Encounter (HOSPITAL_COMMUNITY): Payer: Self-pay | Admitting: Cardiology

## 2017-06-05 DIAGNOSIS — Z794 Long term (current) use of insulin: Secondary | ICD-10-CM | POA: Diagnosis not present

## 2017-06-05 DIAGNOSIS — J449 Chronic obstructive pulmonary disease, unspecified: Secondary | ICD-10-CM | POA: Insufficient documentation

## 2017-06-05 DIAGNOSIS — I10 Essential (primary) hypertension: Secondary | ICD-10-CM | POA: Insufficient documentation

## 2017-06-05 DIAGNOSIS — Z79899 Other long term (current) drug therapy: Secondary | ICD-10-CM | POA: Diagnosis not present

## 2017-06-05 DIAGNOSIS — Z7982 Long term (current) use of aspirin: Secondary | ICD-10-CM | POA: Insufficient documentation

## 2017-06-05 DIAGNOSIS — E119 Type 2 diabetes mellitus without complications: Secondary | ICD-10-CM | POA: Diagnosis not present

## 2017-06-05 DIAGNOSIS — K529 Noninfective gastroenteritis and colitis, unspecified: Secondary | ICD-10-CM

## 2017-06-05 DIAGNOSIS — R1084 Generalized abdominal pain: Secondary | ICD-10-CM | POA: Diagnosis present

## 2017-06-05 DIAGNOSIS — N3001 Acute cystitis with hematuria: Secondary | ICD-10-CM | POA: Diagnosis not present

## 2017-06-05 DIAGNOSIS — Z87891 Personal history of nicotine dependence: Secondary | ICD-10-CM | POA: Diagnosis not present

## 2017-06-05 LAB — COMPREHENSIVE METABOLIC PANEL
ALT: 37 U/L (ref 14–54)
AST: 40 U/L (ref 15–41)
Albumin: 3.2 g/dL — ABNORMAL LOW (ref 3.5–5.0)
Alkaline Phosphatase: 28 U/L — ABNORMAL LOW (ref 38–126)
Anion gap: 11 (ref 5–15)
BUN: 31 mg/dL — AB (ref 6–20)
CHLORIDE: 108 mmol/L (ref 101–111)
CO2: 22 mmol/L (ref 22–32)
CREATININE: 0.92 mg/dL (ref 0.44–1.00)
Calcium: 8.1 mg/dL — ABNORMAL LOW (ref 8.9–10.3)
GFR calc non Af Amer: >60 mL/min (ref >60)
Glucose, Bld: 114 mg/dL — ABNORMAL HIGH (ref 65–99)
POTASSIUM: 4.9 mmol/L (ref 3.5–5.1)
SODIUM: 141 mmol/L (ref 135–145)
Total Bilirubin: 0.5 mg/dL (ref 0.3–1.2)
Total Protein: 5.8 g/dL — ABNORMAL LOW (ref 6.5–8.1)

## 2017-06-05 LAB — CBC WITH DIFFERENTIAL/PLATELET
BASOS PCT: 0 %
Basophils Absolute: 0 10*3/uL (ref 0.0–0.1)
EOS ABS: 0 10*3/uL (ref 0.0–0.7)
Eosinophils Relative: 1 %
HCT: 39.6 % (ref 36.0–46.0)
HEMOGLOBIN: 12.9 g/dL (ref 12.0–15.0)
Lymphocytes Relative: 18 %
Lymphs Abs: 1.1 10*3/uL (ref 0.7–4.0)
MCH: 30.4 pg (ref 26.0–34.0)
MCHC: 32.6 g/dL (ref 30.0–36.0)
MCV: 93.4 fL (ref 78.0–100.0)
Monocytes Absolute: 0.6 10*3/uL (ref 0.1–1.0)
Monocytes Relative: 10 %
NEUTROS PCT: 71 %
Neutro Abs: 4.2 10*3/uL (ref 1.7–7.7)
Platelets: 195 10*3/uL (ref 150–400)
RBC: 4.24 MIL/uL (ref 3.87–5.11)
RDW: 12.7 % (ref 11.5–15.5)
WBC: 5.9 10*3/uL (ref 4.0–10.5)

## 2017-06-05 LAB — URINALYSIS, ROUTINE W REFLEX MICROSCOPIC
Bilirubin Urine: NEGATIVE
GLUCOSE, UA: NEGATIVE mg/dL
KETONES UR: 20 mg/dL — AB
NITRITE: POSITIVE — AB
PH: 5 (ref 5.0–8.0)
PROTEIN: NEGATIVE mg/dL
Specific Gravity, Urine: 1.021 (ref 1.005–1.030)

## 2017-06-05 LAB — CBG MONITORING, ED: Glucose-Capillary: 162 mg/dL — ABNORMAL HIGH (ref 65–99)

## 2017-06-05 LAB — LIPASE, BLOOD: Lipase: 23 U/L (ref 11–51)

## 2017-06-05 MED ORDER — SODIUM CHLORIDE 0.9 % IV BOLUS (SEPSIS)
1000.0000 mL | Freq: Once | INTRAVENOUS | Status: AC
  Administered 2017-06-05: 1000 mL via INTRAVENOUS

## 2017-06-05 MED ORDER — ONDANSETRON HCL 4 MG/2ML IJ SOLN
4.0000 mg | Freq: Once | INTRAMUSCULAR | Status: AC
  Administered 2017-06-05: 4 mg via INTRAVENOUS
  Filled 2017-06-05: qty 2

## 2017-06-05 MED ORDER — SODIUM CHLORIDE 0.9 % IV SOLN
1.0000 g | Freq: Once | INTRAVENOUS | Status: AC
  Administered 2017-06-05: 1 g via INTRAVENOUS
  Filled 2017-06-05: qty 10

## 2017-06-05 MED ORDER — ONDANSETRON 4 MG PO TBDP: 4.0000 mg | ORAL_TABLET | Freq: Three times a day (TID) | ORAL | 0 refills | Status: DC | PRN

## 2017-06-05 MED ORDER — SODIUM CHLORIDE 0.9 % IV SOLN
INTRAVENOUS | Status: DC
  Administered 2017-06-05: 10:00:00 via INTRAVENOUS

## 2017-06-05 MED ORDER — CEPHALEXIN 500 MG PO CAPS: 500.0000 mg | ORAL_CAPSULE | Freq: Four times a day (QID) | ORAL | 0 refills | Status: DC

## 2017-06-05 NOTE — ED Triage Notes (Signed)
Abdominal pain,  Vomiting and diarrhea since Wednesday night

## 2017-06-05 NOTE — ED Notes (Signed)
Pt states she is not able to urinate at this time, aware of DO  

## 2017-06-05 NOTE — Discharge Instructions (Addendum)
As discussed, I suspect you may have the same virus that caused her grandchildren's vomiting and diarrhea, possibly Norovirus which is in the community currently.  Continue taking the nausea medicine if needed for continued nausea or vomiting.  You have also been prescribed an antibiotic for a urinary tract infection.  Complete the entire course.  Get rechecked for any persistent or worsening symptoms.  You may want to consider a bland diet, no spicy, fatty or rich foods for the next several days.  A good diet for vomiting and diarrhea is the B.r.a.t.  diet which includes bananas, rice, applesauce and dry toast.

## 2017-06-05 NOTE — ED Provider Notes (Addendum)
complains of vomiting and diarrhea diffuse myalgias diffuse abdominal pain onset 2 or 3 days ago.  She has not vomited at all today.  She has had 3 episodes of diarrhea today.  On exam appears older than stated age.  Lungs clear to auscultation heart regular rate and rhythm abdomen nondistended normal active bowel sounds nontender   Orlie Dakin, MD 06/05/17 3810    Orlie Dakin, MD 06/07/17 1217

## 2017-06-05 NOTE — ED Provider Notes (Signed)
[facility] EMERGENCY DEPARTMENT Provider Note   CSN: [id number] Arrival date & time: 06/05/17  8182     History   Chief Complaint Chief Complaint  Patient presents with  . Abdominal Pain    HPI Veronica Moses is a 62 y.o. female with past medical history as outlined below, most significant for diabetes, hypertension, seizure disorder, COPD and chronic abdominal pain with a history of GERD presenting with a 2-day history of generalized abdominal pain in association with nausea, vomiting and diarrhea.  Her symptoms started several hours after eating a [employer] sandwich 2 evenings ago which she states was fresh and tasted fine.  She does endorse taking care of several of her grandchildren who had similar symptoms late last week, 1 of which continues to have diarrhea.  She has had no fevers or chills, denies chest pain, shortness of breath.  Her  abdominal pain is constant and cramping in character.  She denies hematemesis and is seeing no blood in her stool which has been liquid and brown.  She reports generalized fatigue and weakness.  She has been unable to maintain any p.o. intake for the past 24 hours.  She did check her CBG prior to arrival and it was 204.  She therefore took 3 units of her NovoLog.    The history is provided by the patient and the spouse.    Past Medical History:  Diagnosis Date  . COPD (chronic obstructive pulmonary disease) ([place])   . Diabetes mellitus    Insulin Dependent. Adult onset  . Dyspnea    with exertion  . Heart murmur   . Hypertension   . Memory changes    since being on vent June 2017- "coming back some."  . Seizures ([place]) 08/2015  . Stroke ([place])   . UTI (lower urinary tract infection)     Patient Active Problem List   Diagnosis Date Noted  . MDD (major depressive disorder), recurrent episode, moderate ([place]) 10/29/2016  . Hypokalemia   . Psychosocial stressors   . Acute encephalopathy   . Insulin dependent diabetes mellitus ([place])   . HCAP  (healthcare-associated pneumonia) 08/27/2015  . Status epilepticus ([place]) 08/20/2015  . Seizure ([place]) 08/20/2015  . DKA (diabetic ketoacidoses) ([place]) 08/20/2015  . Fall 05/07/2014  . History of sprained wrist 05/07/2014  . Protein-calorie malnutrition, severe ([place]) 10/25/2013  . Numbness, limb 10/24/2013  . Chest pain 05/30/2013  . Diabetes ([place]) 02/08/2013  . COPD (chronic obstructive pulmonary disease) ([place]) 02/08/2013  . GERD (gastroesophageal reflux disease) 02/08/2013  . Abdominal pain, chronic, epigastric 02/08/2013    Past Surgical History:  Procedure Laterality Date  . ABDOMINAL HYSTERECTOMY    . BREAST SURGERY Left    cyst-  . CHOLECYSTECTOMY    . COLONOSCOPY WITH ESOPHAGOGASTRODUODENOSCOPY (EGD) N/A 03/04/2013   Procedure: COLONOSCOPY WITH ESOPHAGOGASTRODUODENOSCOPY (EGD);  Surgeon: Rogene Houston, MD;  [facility];  Service: Endoscopy;  Laterality: N/A;  1200-moved to [place] notified pt  . MULTIPLE EXTRACTIONS WITH ALVEOLOPLASTY N/A 05/30/2016   Procedure: MULTIPLE EXTRACTION WITH ALVEOLOPLASTY;  Surgeon: Diona Browner, DDS;  Location: [facility];  Service: Oral Surgery;  Laterality: N/A;  . NEPHRECTOMY Left    Donatated to brother    OB History   None      Home Medications    Prior to Admission medications   Medication Sig Start Date End Date Taking? Authorizing Provider  acetaminophen (TYLENOL) 325 MG tablet Take 325 mg by mouth every 6 (six) hours as needed  for mild pain or moderate pain.    [provider]  amLODipine (NORVASC) 5 MG tablet Take 5 mg by mouth daily.    [provider]  aspirin 81 MG chewable tablet Chew 81 mg by mouth daily.    [provider]  atorvastatin (LIPITOR) 20 MG tablet Take 20 mg by mouth daily.    [provider]  cephALEXin (KEFLEX) 500 MG capsule Take 1 capsule (500 mg total) by mouth 4 (four) times daily. 06/05/17   Evalee Jefferson, PA-C  cloNIDine (CATAPRES) 0.1 MG tablet Take one tablet  every 12 hours if your blood pressure is greater than 160 systolic (upper number). 10/30/16   Veryl Speak, MD  FLUoxetine (PROZAC) 40 MG capsule Take 1 capsule (40 mg total) by mouth daily. 10/29/16 05/22/17  Norman Clay, MD  furosemide (LASIX) 40 MG tablet Take 40 mg by mouth daily.    [provider]  gabapentin (NEURONTIN) 100 MG capsule Take 100 mg by mouth at bedtime.    [provider]  ibuprofen (ADVIL,MOTRIN) 200 MG tablet Take 200 mg by mouth every 6 (six) hours as needed for headache.    [provider]  insulin glargine (LANTUS) 100 UNIT/ML injection Inject 15 Units into the skin daily.     [provider]  lacosamide (VIMPAT) 200 MG TABS tablet Take 200 mg by mouth 2 (two) times daily.    [provider]  lisinopril (PRINIVIL,ZESTRIL) 40 MG tablet Take 40 mg by mouth daily.    [provider]  NOVOLOG FLEXPEN 100 UNIT/ML FlexPen Please use the hospital sliding scale provided for you Patient taking differently: Please use the hospital sliding scale provided for you take with each meal 08/25/15   Sinda Du, MD  ondansetron (ZOFRAN ODT) 4 MG disintegrating tablet Take 1 tablet (4 mg total) by mouth every 8 (eight) hours as needed for nausea or vomiting. 06/05/17   Birney Belshe, Almyra Free, PA-C  RESTASIS 0.05 % ophthalmic emulsion Apply 1 drop to eye 2 (two) times daily. 04/29/14   [provider]    Family History Family History  Problem Relation Age of Onset  . Diabetes Father   . CAD Unknown        family history  . Diabetes Brother   . Colon cancer Neg Hx     Social History Social History   Tobacco Use  . Smoking status: Former Smoker    Packs/day: 1.00    Years: 26.00    Pack years: 26.00    Types: Cigarettes    Last attempt to quit: 08/27/2015    Years since quitting: 1.7  . Smokeless tobacco: Never Used  Substance Use Topics  . Alcohol use: No  . Drug use: No     Allergies   Demerol; Depakote [divalproex  sodium]; Dilaudid [hydromorphone hcl]; Doxycycline; and Valproic acid   Review of Systems Review of Systems  Constitutional: Positive for fatigue. Negative for chills and fever.  HENT: Negative for congestion and sore throat.   Eyes: Negative.   Respiratory: Negative for chest tightness and shortness of breath.   Cardiovascular: Negative for chest pain.  Gastrointestinal: Positive for abdominal pain, diarrhea, nausea and vomiting. Negative for blood in stool.  Genitourinary: Negative.   Musculoskeletal: Negative for arthralgias, joint swelling and neck pain.  Skin: Negative.  Negative for rash and wound.  Neurological: Negative for dizziness, weakness, light-headedness, numbness and headaches.  Psychiatric/Behavioral: Negative.      Physical Exam Updated Vital Signs BP 113/67 (  BP Location: Right Arm)   Pulse 70   Temp 97.7 F (36.5 C) (Oral)   Resp 19   Ht 5\' 2"  (1.575 m)   Wt 50.8 kg (112 lb)   SpO2 97%   BMI 20.49 kg/m   Physical Exam  Constitutional: She appears well-developed and well-nourished.  HENT:  Head: Normocephalic and atraumatic.  Mouth/Throat: Mucous membranes are dry.  Eyes: Conjunctivae are normal.  Neck: Normal range of motion.  Cardiovascular: Normal rate, regular rhythm, normal heart sounds and intact distal pulses.  Pulmonary/Chest: Effort normal and breath sounds normal. She has no wheezes.  Abdominal: Soft. Bowel sounds are increased. There is no hepatosplenomegaly. There is generalized tenderness. There is no rebound and no guarding.  Well healed surgical incision left upper abd.  Musculoskeletal: Normal range of motion.  Neurological: She is alert.  Skin: Skin is warm and dry.  Psychiatric: She has a normal mood and affect.  Nursing note and vitals reviewed.    ED Treatments / Results  Labs (all labs ordered are listed, but only abnormal results are displayed) Labs Reviewed  COMPREHENSIVE METABOLIC PANEL - Abnormal; Notable for the  following components:      Result Value   Glucose, Bld 114 (*)    BUN 31 (*)    Calcium 8.1 (*)    Total Protein 5.8 (*)    Albumin 3.2 (*)    Alkaline Phosphatase 28 (*)    All other components within normal limits  URINALYSIS, ROUTINE W REFLEX MICROSCOPIC - Abnormal; Notable for the following components:   APPearance CLOUDY (*)    Hgb urine dipstick MODERATE (*)    Ketones, ur 20 (*)    Nitrite POSITIVE (*)    Leukocytes, UA LARGE (*)    Bacteria, UA MANY (*)    Squamous Epithelial / LPF 0-5 (*)    All other components within normal limits  CBG MONITORING, ED - Abnormal; Notable for the following components:   Glucose-Capillary 162 (*)    All other components within normal limits  URINE CULTURE  LIPASE, BLOOD  CBC WITH DIFFERENTIAL/PLATELET    EKG  EKG Interpretation None       Radiology Dg Abd Acute W/chest  Result Date: 06/05/2017 CLINICAL DATA:  Vomiting and diarrhea for several days, abdominal pain EXAM: DG ABDOMEN ACUTE W/ 1V CHEST COMPARISON:  10/30/2016 FINDINGS: Cardiac shadow is within normal limits. The lungs are clear bilaterally. Scattered large and small bowel gas is noted. No abnormal mass or abnormal calcifications are seen. No obstructive changes are noted. Postsurgical changes are seen in the left abdomen. No bony abnormality is noted. IMPRESSION: No acute abnormality noted. Electronically Signed   By: Inez Catalina M.D.   On: 06/05/2017 10:16    Procedures Procedures (including critical care time)  Medications Ordered in ED Medications  sodium chloride 0.9 % bolus 1,000 mL (0 mLs Intravenous Stopped 06/05/17 1002)    And  0.9 %  sodium chloride infusion ( Intravenous New Bag/Given 06/05/17 1003)  ondansetron (ZOFRAN) injection 4 mg (4 mg Intravenous Given 06/05/17 0907)  cefTRIAXone (ROCEPHIN) 1 g in sodium chloride 0.9 % 100 mL IVPB (0 g Intravenous Stopped 06/05/17 1415)     Initial Impression / Assessment and Plan / ED Course  I have reviewed the  triage vital signs and the nursing notes.  Pertinent labs & imaging results that were available during my care of the patient were reviewed by me and considered in my medical decision making (see chart  for details).    Labs reviewed and discussed with patient.  Imaging negative for signs of abdominal obstruction.  Pt was exposed to grandchildren with similar sx, suggesting viral GI infection.  Pt was given IV fluids and zofran with improvement in n/v.  She had one episode of diarrhea while here upon first arrival, but none since. She felt much improved after receiving IV fluids and tolerated a PO challenge of fluids. Labs revealing uti, cx sent.  Pt endorses has been having intermittent episodes of dysuria the past several week, denies current sx. She was given IV rocephin. Plan for zofran, keflex at home, letting diarrhea run its course since this sx has already improved.    Discussed return precautions and/or f/u with pcp if sx persist or worsen.  Discussed B.r.a.t diet.  Final Clinical Impressions(s) / ED Diagnoses   Final diagnoses:  Gastroenteritis  Acute cystitis with hematuria    ED Discharge Orders        Ordered    cephALEXin (KEFLEX) 500 MG capsule  4 times daily     06/05/17 1538    ondansetron (ZOFRAN ODT) 4 MG disintegrating tablet  Every 8 hours PRN     06/05/17 1538       Evalee Jefferson, PA-C 06/05/17 1727    Orlie Dakin, MD 06/07/17 1217

## 2017-06-05 NOTE — ED Notes (Signed)
Pt returned from xray

## 2017-06-08 LAB — URINE CULTURE
Culture: 80000 — AB
Special Requests: NORMAL

## 2017-06-09 ENCOUNTER — Telehealth: Payer: Self-pay | Admitting: Emergency Medicine

## 2017-06-09 NOTE — Telephone Encounter (Signed)
Post ED Visit - Positive Culture Follow-up  Culture report reviewed by antimicrobial stewardship pharmacist:  []  Elenor Quinones, Pharm.D. []  Heide Guile, Pharm.D., BCPS AQ-ID []  Parks Neptune, Pharm.D., BCPS []  Alycia Rossetti, Pharm.D., BCPS []  [place], Pharm.D., BCPS, AAHIVP [x]  Legrand Como, Pharm.D., BCPS, AAHIVP []  Salome Arnt, PharmD, BCPS []  Jalene Mullet, PharmD []  Vincenza Hews, PharmD, BCPS  Positive urine culture Treated with cephalexin, organism sensitive to the same and no further patient follow-up is required at this time.  Hazle Nordmann 06/09/2017, 3:52 PM

## 2019-04-13 ENCOUNTER — Emergency Department (HOSPITAL_COMMUNITY): Payer: Medicaid Other

## 2019-04-13 ENCOUNTER — Other Ambulatory Visit: Payer: Self-pay

## 2019-04-13 ENCOUNTER — Emergency Department (HOSPITAL_COMMUNITY)
Admission: EM | Admit: 2019-04-13 | Discharge: 2019-04-13 | Disposition: A | Discharge status: Home / Self Care | Payer: Medicaid Other | Attending: Emergency Medicine | Admitting: Emergency Medicine

## 2019-04-13 ENCOUNTER — Encounter (HOSPITAL_COMMUNITY): Payer: Self-pay | Admitting: Emergency Medicine

## 2019-04-13 DIAGNOSIS — Z7982 Long term (current) use of aspirin: Secondary | ICD-10-CM | POA: Insufficient documentation

## 2019-04-13 DIAGNOSIS — Z87891 Personal history of nicotine dependence: Secondary | ICD-10-CM | POA: Insufficient documentation

## 2019-04-13 DIAGNOSIS — M79662 Pain in left lower leg: Secondary | ICD-10-CM | POA: Insufficient documentation

## 2019-04-13 DIAGNOSIS — Z794 Long term (current) use of insulin: Secondary | ICD-10-CM | POA: Insufficient documentation

## 2019-04-13 DIAGNOSIS — C4372 Malignant melanoma of left lower limb, including hip: Secondary | ICD-10-CM | POA: Insufficient documentation

## 2019-04-13 DIAGNOSIS — N12 Tubulo-interstitial nephritis, not specified as acute or chronic: Secondary | ICD-10-CM | POA: Insufficient documentation

## 2019-04-13 DIAGNOSIS — Z79899 Other long term (current) drug therapy: Secondary | ICD-10-CM | POA: Insufficient documentation

## 2019-04-13 DIAGNOSIS — E119 Type 2 diabetes mellitus without complications: Secondary | ICD-10-CM | POA: Diagnosis not present

## 2019-04-13 DIAGNOSIS — J449 Chronic obstructive pulmonary disease, unspecified: Secondary | ICD-10-CM | POA: Diagnosis not present

## 2019-04-13 DIAGNOSIS — I10 Essential (primary) hypertension: Secondary | ICD-10-CM | POA: Insufficient documentation

## 2019-04-13 DIAGNOSIS — R1030 Lower abdominal pain, unspecified: Secondary | ICD-10-CM | POA: Diagnosis present

## 2019-04-13 LAB — URINALYSIS, ROUTINE W REFLEX MICROSCOPIC
Bilirubin Urine: NEGATIVE
Glucose, UA: NEGATIVE mg/dL
Hgb urine dipstick: NEGATIVE
Ketones, ur: NEGATIVE mg/dL
Nitrite: POSITIVE — AB
Protein, ur: NEGATIVE mg/dL
Specific Gravity, Urine: 1.03 (ref 1.005–1.030)
pH: 6 (ref 5.0–8.0)

## 2019-04-13 LAB — CBC WITH DIFFERENTIAL/PLATELET
Abs Immature Granulocytes: 0.03 10*3/uL (ref 0.00–0.07)
Basophils Absolute: 0 10*3/uL (ref 0.0–0.1)
Basophils Relative: 1 %
Eosinophils Absolute: 0.1 10*3/uL (ref 0.0–0.5)
Eosinophils Relative: 1 %
HCT: 39.2 % (ref 36.0–46.0)
Hemoglobin: 12.8 g/dL (ref 12.0–15.0)
Immature Granulocytes: 1 %
Lymphocytes Relative: 31 %
Lymphs Abs: 2 10*3/uL (ref 0.7–4.0)
MCH: 30.8 pg (ref 26.0–34.0)
MCHC: 32.7 g/dL (ref 30.0–36.0)
MCV: 94.2 fL (ref 80.0–100.0)
Monocytes Absolute: 0.5 10*3/uL (ref 0.1–1.0)
Monocytes Relative: 8 %
Neutro Abs: 3.8 10*3/uL (ref 1.7–7.7)
Neutrophils Relative %: 58 %
Platelets: 262 10*3/uL (ref 150–400)
RBC: 4.16 MIL/uL (ref 3.87–5.11)
RDW: 13.7 % (ref 11.5–15.5)
WBC: 6.4 10*3/uL (ref 4.0–10.5)
nRBC: 0 % (ref 0.0–0.2)

## 2019-04-13 LAB — CBG MONITORING, ED
Glucose-Capillary: 37 mg/dL — CL (ref 70–99)
Glucose-Capillary: 186 mg/dL — ABNORMAL HIGH (ref 70–99)

## 2019-04-13 LAB — COMPREHENSIVE METABOLIC PANEL
ALT: 31 U/L (ref 0–44)
AST: 33 U/L (ref 15–41)
Albumin: 3.5 g/dL (ref 3.5–5.0)
Alkaline Phosphatase: 47 U/L (ref 38–126)
Anion gap: 5 (ref 5–15)
BUN: 14 mg/dL (ref 8–23)
CO2: 28 mmol/L (ref 22–32)
Calcium: 9.2 mg/dL (ref 8.9–10.3)
Chloride: 102 mmol/L (ref 98–111)
Creatinine, Ser: 0.83 mg/dL (ref 0.44–1.00)
GFR calc Af Amer: >60 mL/min (ref >60)
GFR calc non Af Amer: >60 mL/min (ref >60)
Glucose, Bld: 122 mg/dL — ABNORMAL HIGH (ref 70–99)
Potassium: 4.7 mmol/L (ref 3.5–5.1)
Sodium: 135 mmol/L (ref 135–145)
Total Bilirubin: 0.4 mg/dL (ref 0.3–1.2)
Total Protein: 6.9 g/dL (ref 6.5–8.1)

## 2019-04-13 MED ORDER — IOHEXOL 300 MG/ML  SOLN
100.0000 mL | Freq: Once | INTRAMUSCULAR | Status: AC | PRN
  Administered 2019-04-13: 18:00:00 100 mL via INTRAVENOUS

## 2019-04-13 MED ORDER — DEXTROSE 50 % IV SOLN
INTRAVENOUS | Status: AC
  Administered 2019-04-13: 19:00:00 50 mL via INTRAVENOUS
  Filled 2019-04-13: qty 50

## 2019-04-13 MED ORDER — ACETAMINOPHEN-CODEINE #3 300-30 MG PO TABS: 1.0000 | ORAL_TABLET | Freq: Four times a day (QID) | ORAL | 0 refills | Status: DC | PRN

## 2019-04-13 MED ORDER — ONDANSETRON 4 MG PO TBDP: 4.0000 mg | ORAL_TABLET | Freq: Three times a day (TID) | ORAL | 0 refills | Status: DC | PRN

## 2019-04-13 MED ORDER — ONDANSETRON HCL 4 MG/2ML IJ SOLN
4.0000 mg | Freq: Once | INTRAMUSCULAR | Status: AC
  Administered 2019-04-13: 17:00:00 4 mg via INTRAVENOUS
  Filled 2019-04-13: qty 2

## 2019-04-13 MED ORDER — DEXTROSE 50 % IV SOLN: 50.0000 mL | Freq: Once | INTRAVENOUS | Status: AC

## 2019-04-13 MED ORDER — FENTANYL CITRATE (PF) 100 MCG/2ML IJ SOLN
50.0000 ug | Freq: Once | INTRAMUSCULAR | Status: AC
  Administered 2019-04-13: 17:00:00 50 ug via INTRAVENOUS
  Filled 2019-04-13: qty 2

## 2019-04-13 MED ORDER — SODIUM CHLORIDE 0.9 % IV SOLN
1.0000 g | Freq: Once | INTRAVENOUS | Status: AC
  Administered 2019-04-13: 22:00:00 1 g via INTRAVENOUS
  Filled 2019-04-13: qty 10

## 2019-04-13 MED ORDER — SODIUM CHLORIDE 0.9 % IV BOLUS
500.0000 mL | Freq: Once | INTRAVENOUS | Status: AC
  Administered 2019-04-13: 17:00:00 500 mL via INTRAVENOUS

## 2019-04-13 MED ORDER — CEPHALEXIN 500 MG PO CAPS: 500.0000 mg | ORAL_CAPSULE | Freq: Four times a day (QID) | ORAL | 0 refills | Status: DC

## 2019-04-13 MED ORDER — SODIUM CHLORIDE 0.9 % IV BOLUS
500.0000 mL | Freq: Once | INTRAVENOUS | Status: AC
  Administered 2019-04-13: 20:00:00 500 mL via INTRAVENOUS

## 2019-04-13 NOTE — ED Triage Notes (Signed)
Pt states " I had a mole removed on my right leg and immediately after I had dizziness, lower abdominal pain and insomnia"  S/s have last over a month.

## 2019-04-13 NOTE — ED Provider Notes (Addendum)
[facility] EMERGENCY DEPARTMENT Provider Note   CSN: [id number] Arrival date & time: 04/13/19  1438     History Chief Complaint  Patient presents with  . Abdominal Pain    Veronica Moses is a 64 y.o. female.  HPI     64 year old female with history of insulin-dependent diabetes, COPD, seizures and strokes comes in a chief complaint of abdominal pain.  She reports that she was having abdominal discomfort for the last month.  Her pain has progressed and it is fairly constant or at the lower part of her abdomen with radiation to the back.  She denies any associated nausea, vomiting, fevers, chills, UTI-like symptoms.  She has no history of similar pain and is status post appendectomy and total hysterectomy.  Additionally patient is complaining of some pain in her left leg.  She was recently diagnosed with melanoma and had skin resection.  She has pain not only at the resection site but also in her left lower leg below the knee.  Past Medical History:  Diagnosis Date  . COPD (chronic obstructive pulmonary disease) ([place])   . Diabetes mellitus    Insulin Dependent. Adult onset  . Dyspnea    with exertion  . Heart murmur   . Hypertension   . Memory changes    since being on vent June 2017- "coming back some."  . Seizures ([place]) 08/2015  . Stroke ([place])   . UTI (lower urinary tract infection)     Patient Active Problem List   Diagnosis Date Noted  . MDD (major depressive disorder), recurrent episode, moderate ([place]) 10/29/2016  . Hypokalemia   . Psychosocial stressors   . Acute encephalopathy   . Insulin dependent diabetes mellitus   . HCAP (healthcare-associated pneumonia) 08/27/2015  . Status epilepticus ([place]) 08/20/2015  . Seizure ([place]) 08/20/2015  . DKA (diabetic ketoacidoses) ([place]) 08/20/2015  . Fall 05/07/2014  . History of sprained wrist 05/07/2014  . Protein-calorie malnutrition, severe ([place]) 10/25/2013  . Numbness, limb 10/24/2013  . Chest pain 05/30/2013  .  Diabetes ([place]) 02/08/2013  . COPD (chronic obstructive pulmonary disease) ([place]) 02/08/2013  . GERD (gastroesophageal reflux disease) 02/08/2013  . Abdominal pain, chronic, epigastric 02/08/2013    Past Surgical History:  Procedure Laterality Date  . ABDOMINAL HYSTERECTOMY    . BREAST SURGERY Left    cyst-  . CHOLECYSTECTOMY    . COLONOSCOPY WITH ESOPHAGOGASTRODUODENOSCOPY (EGD) N/A 03/04/2013   Procedure: COLONOSCOPY WITH ESOPHAGOGASTRODUODENOSCOPY (EGD);  Surgeon: Rogene Houston, MD;  Location[facility];  Service: Endoscopy;  Laterality: N/A;  1200-moved to [place] notified pt  . MULTIPLE EXTRACTIONS WITH ALVEOLOPLASTY N/A 05/30/2016   Procedure: MULTIPLE EXTRACTION WITH ALVEOLOPLASTY;  Surgeon: Diona Browner, DDS;  Location: [place];  Service: Oral Surgery;  Laterality: N/A;  . NEPHRECTOMY Left    Donatated to brother     OB History   No obstetric history on file.     Family History  Problem Relation Age of Onset  . Diabetes Father   . CAD Other        family history  . Diabetes Brother   . Colon cancer Neg Hx     Social History   Tobacco Use  . Smoking status: Former Smoker    Packs/day: 1.00    Years: 26.00    Pack years: 26.00    Types: Cigarettes    Quit date: 08/27/2015    Years since quitting: 3.6  . Smokeless tobacco: Never Used  Substance Use Topics  .  Alcohol use: No  . Drug use: No    Home Medications Prior to Admission medications   Medication Sig Start Date End Date Taking? Authorizing Provider  acetaminophen (TYLENOL) 325 MG tablet Take 325 mg by mouth every 6 (six) hours as needed for mild pain or moderate pain.   Yes [provider]  amLODipine (NORVASC) 5 MG tablet Take 5 mg by mouth daily.   Yes [provider]  aspirin 81 MG chewable tablet Chew 81 mg by mouth daily.   Yes [provider]  atorvastatin (LIPITOR) 20 MG tablet Take 20 mg by mouth daily.   Yes [provider]  gabapentin (NEURONTIN) 100 MG  capsule Take 100 mg by mouth at bedtime.   Yes [provider]  ibuprofen (ADVIL,MOTRIN) 200 MG tablet Take 200 mg by mouth every 6 (six) hours as needed for headache.   Yes [provider]  Insulin Glargine (LANTUS) 100 UNIT/ML Solostar Pen Inject 24 Units into the skin daily. 06/02/18  Yes [provider]  lacosamide (VIMPAT) 200 MG TABS tablet Take 200 mg by mouth 2 (two) times daily.   Yes [provider]  NOVOLOG FLEXPEN 100 UNIT/ML FlexPen Please use the hospital sliding scale provided for you Patient taking differently: Please use the hospital sliding scale provided for you take with each meal 08/25/15  Yes Sinda Du, MD  RESTASIS 0.05 % ophthalmic emulsion Apply 1 drop to eye 2 (two) times daily. 04/29/14  Yes [provider]  acetaminophen-codeine (TYLENOL #3) 300-30 MG tablet Take 1-2 tablets by mouth every 6 (six) hours as needed for moderate pain. 04/13/19   Varney Biles, MD  cephALEXin (KEFLEX) 500 MG capsule Take 1 capsule (500 mg total) by mouth 4 (four) times daily. 04/13/19   Varney Biles, MD  cloNIDine (CATAPRES) 0.1 MG tablet Take one tablet every 12 hours if your blood pressure is greater than 99991111 systolic (upper number). 10/30/16   Veryl Speak, MD  furosemide (LASIX) 40 MG tablet Take 40 mg by mouth daily.    [provider]  lisinopril (PRINIVIL,ZESTRIL) 40 MG tablet Take 40 mg by mouth daily.    [provider]  ondansetron (ZOFRAN ODT) 4 MG disintegrating tablet Take 1 tablet (4 mg total) by mouth every 8 (eight) hours as needed for nausea or vomiting. 04/13/19   Varney Biles, MD    Allergies    Demerol, Depakote [divalproex sodium], Dilaudid [hydromorphone hcl], Doxycycline, and Valproic acid  Review of Systems   Review of Systems  Constitutional: Positive for activity change.  Cardiovascular: Negative for chest pain.  Gastrointestinal: Positive for abdominal pain. Negative for nausea and vomiting.   Genitourinary: Negative for dysuria.  Skin: Positive for rash.  Allergic/Immunologic: Negative for immunocompromised state.  Hematological: Does not bruise/bleed easily.  All other systems reviewed and are negative.   Physical Exam Updated Vital Signs BP (!) 102/45   Pulse 70   Temp 97.8 F (36.6 C) (Oral)   Resp 16   Ht 5\' 1"  (1.549 m)   Wt 50.8 kg   SpO2 97%   BMI 21.16 kg/m   Physical Exam Vitals and nursing note reviewed.  Constitutional:      Appearance: She is well-developed.  HENT:     Head: Normocephalic and atraumatic.  Cardiovascular:     Rate and Rhythm: Normal rate.  Pulmonary:     Effort: Pulmonary effort is normal.  Abdominal:     General: Bowel sounds are normal.  Tenderness: There is abdominal tenderness in the right lower quadrant and suprapubic area. There is guarding. There is no rebound.  Musculoskeletal:     Cervical back: Normal range of motion and neck supple.  Skin:    General: Skin is warm and dry.     Comments: Left calf tenderness  Neurological:     Mental Status: She is alert and oriented to person, place, and time.     ED Results / Procedures / Treatments   Labs (all labs ordered are listed, but only abnormal results are displayed) Labs Reviewed  COMPREHENSIVE METABOLIC PANEL - Abnormal; Notable for the following components:      Result Value   Glucose, Bld 122 (*)    All other components within normal limits  URINALYSIS, ROUTINE W REFLEX MICROSCOPIC - Abnormal; Notable for the following components:   Nitrite POSITIVE (*)    Leukocytes,Ua TRACE (*)    Bacteria, UA FEW (*)    All other components within normal limits  CBG MONITORING, ED - Abnormal; Notable for the following components:   Glucose-Capillary 37 (*)    All other components within normal limits  CBG MONITORING, ED - Abnormal; Notable for the following components:   Glucose-Capillary 186 (*)    All other components within normal limits  URINE CULTURE  CBC WITH  DIFFERENTIAL/PLATELET    EKG None  Radiology CT ABDOMEN PELVIS W CONTRAST  Result Date: 04/13/2019 CLINICAL DATA:  Abdominal distension nausea and vomiting with lower quadrant pain EXAM: CT ABDOMEN AND PELVIS WITH CONTRAST TECHNIQUE: Multidetector CT imaging of the abdomen and pelvis was performed using the standard protocol following bolus administration of intravenous contrast. CONTRAST:  176mL OMNIPAQUE IOHEXOL 300 MG/ML  SOLN COMPARISON:  01/26/2016 FINDINGS: Lower chest: Basilar atelectasis. No signs of effusion or dense consolidation. No pericardial fluid. Heart is incompletely imaged. Hepatobiliary: Small flash fill hemangioma in the right hepatic lobe, patent subsegment VIII another smaller similar lesion in the posterior right hepatic lobe, areas are unchanged since 2017. Portal vein is patent. Stable post cholecystectomy biliary ductal distension. Pancreas: Pancreas is normal. Spleen: Spleen is normal size without focal lesion. Adrenals/Urinary Tract: Adrenal glands are normal. Post left nephrectomy. Tiny low-attenuation lesion in the adrenal gland is unchanged, approximately 5 mm. Right ureteral distension and dilation of the renal pelvis with mild distension of the collecting systems. Transition occurring over the pelvic brim. Question of eccentric filling defect in the distal right ureter (image 42, series 5 and image 54, series 2). A 1.9 x 1.8 cm area of low attenuation seen on the nephrogram phase of the evaluation tracks to the periphery of the kidney and is located in the anterior hilar lip. Suggestion of renal cortical scarring and striated nephrogram. Arterial phase with loss of normal corticomedullary differentiation in this area. Mixed density in the urinary bladder is of uncertain significance. There is low-density with amorphous features in the dependent portion of the urinary bladder. Contrast is present in the urinary tract, perhaps administered during a different examination or  incidentally administered early for this study. This is not clear. Urinary bladder is otherwise unremarkable. Stomach/Bowel: Small hiatal hernia. No signs of acute bowel process. Stool throughout much of the colon. Appendix not visualized, surgically absent. Vascular/Lymphatic: Calcific and noncalcific atherosclerotic changes throughout the abdominal aorta. No signs of aneurysm. No signs of pelvic lymphadenopathy. Reproductive: Post hysterectomy, no signs of adnexal mass. Other: No signs of free air. No free fluid. No significant hernia. Musculoskeletal: No acute bone finding or destructive  bone process. IMPRESSION: 1. Low-density lesion in the anterior hilar lip on the nephrogram phase is suggestive of focal nephritis with other areas of cortical scarring and striated nephrogram. Correlate with urine studies. Suggest follow-up hematuria/renal protocol CT to ensure resolution of above finding after treatment. 2. Eccentric filling defect along the ureter is suggested on today's study this could be further evaluated as well with a dedicated follow-up hematuria study after resolution of symptoms to ensure that this does not represent debris though though it is non dependent and raises the question of small urothelial neoplasm. 3. mixed density in the dependent portion of the urinary bladder is of uncertain significance. This could represent blood products or debris. 4. Post left nephrectomy. 5. Small hiatal hernia. 6. Stable hepatic hemangiomas. 7. Stable 5 mm low-attenuation lesion in the right adrenal gland. 8. Aortic atherosclerosis. 9. These results were called by telephone at the time of interpretation on 04/13/2019 at 7:11 pm to provider [facility] , who verbally acknowledged these results. Aortic Atherosclerosis (ICD10-I70.0). Electronically Signed   By: Zetta Bills M.D.   On: 04/13/2019 19:11   US Venous Img Lower Unilateral Left  Result Date: 04/13/2019 CLINICAL DATA:  Calf pain x3 days EXAM: LEFT  LOWER EXTREMITY VENOUS DOPPLER ULTRASOUND TECHNIQUE: Gray-scale sonography with compression, as well as color and duplex ultrasound, were performed to evaluate the deep venous system(s) from the level of the common femoral vein through the popliteal and proximal calf veins. COMPARISON:  None. FINDINGS: VENOUS Normal compressibility of the common femoral, superficial femoral, and popliteal veins, as well as the visualized calf veins. Visualized portions of profunda femoral vein and great saphenous vein unremarkable. No filling defects to suggest DVT on grayscale or color Doppler imaging. Doppler waveforms show normal direction of venous flow, normal respiratory phasicity and response to augmentation. Limited views of the contralateral common femoral vein are unremarkable. OTHER None. Limitations: none IMPRESSION: No femoropopliteal DVT nor evidence of DVT within the visualized calf veins. If clinical symptoms are inconsistent or if there are persistent or worsening symptoms, further imaging (possibly involving the iliac veins) may be warranted. Electronically Signed   By: Lucrezia Europe M.D.   On: 04/13/2019 18:10    Procedures Procedures (including critical care time)  Medications Ordered in ED Medications  ondansetron (ZOFRAN) injection 4 mg (4 mg Intravenous Given 04/13/19 1708)  fentaNYL (SUBLIMAZE) injection 50 mcg (50 mcg Intravenous Given 04/13/19 1710)  sodium chloride 0.9 % bolus 500 mL (0 mLs Intravenous Stopped 04/13/19 1824)  iohexol (OMNIPAQUE) 300 MG/ML solution 100 mL (100 mLs Intravenous Contrast Given 04/13/19 1829)  sodium chloride 0.9 % bolus 500 mL (0 mLs Intravenous Stopped 04/13/19 2046)  dextrose 50 % solution 50 mL (50 mLs Intravenous Given 04/13/19 1924)  cefTRIAXone (ROCEPHIN) 1 g in sodium chloride 0.9 % 100 mL IVPB (1 g Intravenous New Bag/Given 04/13/19 2213)    ED Course  I have reviewed the triage vital signs and the nursing notes.  Pertinent labs & imaging results that were  available during my care of the patient were reviewed by me and considered in my medical decision making (see chart for details).  Clinical Course as of Apr 13 2243  Wed Apr 13, 2019  2244 CT scan shows findings that are concerning for possible pyelonephritis.  There is no perinephric abscess.  I spoke with the radiologist and they recommend that patient be provided with urology follow-up if concerns for infection is low.  CT ABDOMEN PELVIS W CONTRAST [AN]  2244 UA is positive for nitrite.  Patient is having lower quadrant abdominal pain and back pain but she does not have burning with urination or urinary frequency.  It is unclear if she actually has pyelonephritis or not.  Given that she has known melanoma history we will advise that she follow-up with urology to ensure there is no malignancy that is showing up with pyelonephritis-like finding and revealing filling defect in the ureters.  Patient advised to take the antibiotics as prescribed and follow-up with urology in 2 weeks.  She is comfortable with the plan.  Nitrite(!): POSITIVE [AN]    Clinical Course User Index [AN] Varney Biles, MD   MDM Rules/Calculators/A&P                      64 year old female comes in a chief complaint of abdominal pain and leg pain.  Both of her pains have been present for several days now.  On abdominal exam her tenderness is significant and she has some guarding.  She is diabetic and has history of hysterectomy and appendectomy.  No UTI-like symptoms, no systemic symptoms.   Differential diagnosis includes diverticulitis, intra-abdominal abscess, AAA.  She smokes 1 pack a day.  Vascular exam reveals 2+ femoral and 1+ dorsalis pedis pulse bilaterally.  Doubt that this is mesenteric ischemia.  We also considered DVT in the differential.  There is no signs of cellulitis.  Plan is to get basic labs and a CT scan given the intensity of her pain in her abdomen.  We will also need to get an ultrasound of her  legs given history of melanoma and calf tenderness.  Final Clinical Impression(s) / ED Diagnoses Final diagnoses:  Pyelonephritis    Rx / DC Orders ED Discharge Orders         Ordered    cephALEXin (KEFLEX) 500 MG capsule  4 times daily     04/13/19 2240    ondansetron (ZOFRAN ODT) 4 MG disintegrating tablet  Every 8 hours PRN     04/13/19 2240    acetaminophen-codeine (TYLENOL #3) 300-30 MG tablet  Every 6 hours PRN     04/13/19 2243           Varney Biles, MD 04/13/19 1647    Varney Biles, MD 04/13/19 2245

## 2019-04-13 NOTE — ED Notes (Signed)
Dr Kathrynn Humble notified of CBG 37, orders obtained and carried out

## 2019-04-13 NOTE — Discharge Instructions (Signed)
You are seen in the ER for abdominal discomfort. Our work-up in the ER shows that you likely have kidney infection.  The urine also shows signs of infection.  However, you are not having any typical symptoms of urinary tract infection therefore we recommend that you follow-up with urologist in 2 weeks.  Please return to the ER if your symptoms worsen; you have increased pain, fevers, chills, inability to keep any medications down, confusion. Otherwise see the outpatient doctor as requested.

## 2019-04-16 LAB — URINE CULTURE: Culture: 50000 — AB

## 2019-04-17 ENCOUNTER — Telehealth: Payer: Self-pay | Admitting: Emergency Medicine

## 2019-04-17 NOTE — Telephone Encounter (Signed)
Post ED Visit - Positive Culture Follow-up  Culture report reviewed by antimicrobial stewardship pharmacist: [facility] Team []  Elenor Quinones, Pharm.D. []  Heide Guile, Pharm.D., BCPS AQ-ID []  Parks Neptune, Pharm.D., BCPS []  Alycia Rossetti, Pharm.D., BCPS []  [place], [place].D., BCPS, AAHIVP []  Legrand Como, Pharm.D., BCPS, AAHIVP []  Salome Arnt, PharmD, BCPS []  Johnnette Gourd, PharmD, BCPS []  Hughes Better, PharmD, BCPS [x]  Duanne Limerick, PharmD []  Laqueta Linden, PharmD, BCPS []  Albertina Parr, PharmD  [facility] []  Leodis Sias, PharmD []  Lindell Spar, PharmD []  Royetta Asal, PharmD []  Graylin Shiver, Rph []  Rema Fendt) Glennon Mac, PharmD []  Arlyn Dunning, PharmD []  Netta Cedars, PharmD []  Dia Sitter, PharmD []  Leone Haven, PharmD []  Gretta Arab, PharmD []  Theodis Shove, PharmD []  Peggyann Juba, PharmD []  Reuel Boom, PharmD   Positive urine culture Treated with Cephalexin, organism sensitive to the same and no further patient follow-up is required at this time.  Sandi Raveling [place] Zirkelbach 04/17/2019, 4:32 PM

## 2019-11-08 DIAGNOSIS — G40911 Epilepsy, unspecified, intractable, with status epilepticus: Secondary | ICD-10-CM | POA: Diagnosis not present

## 2019-11-08 DIAGNOSIS — Z8673 Personal history of transient ischemic attack (TIA), and cerebral infarction without residual deficits: Secondary | ICD-10-CM | POA: Diagnosis not present

## 2019-11-08 DIAGNOSIS — Z0000 Encounter for general adult medical examination without abnormal findings: Secondary | ICD-10-CM | POA: Diagnosis not present

## 2019-11-08 DIAGNOSIS — F339 Major depressive disorder, recurrent, unspecified: Secondary | ICD-10-CM | POA: Diagnosis not present

## 2019-11-08 DIAGNOSIS — Z6821 Body mass index (BMI) 21.0-21.9, adult: Secondary | ICD-10-CM | POA: Diagnosis not present

## 2019-11-08 DIAGNOSIS — E1121 Type 2 diabetes mellitus with diabetic nephropathy: Secondary | ICD-10-CM | POA: Diagnosis not present

## 2019-11-08 DIAGNOSIS — R05 Cough: Secondary | ICD-10-CM | POA: Diagnosis not present

## 2019-12-06 DIAGNOSIS — D1801 Hemangioma of skin and subcutaneous tissue: Secondary | ICD-10-CM | POA: Diagnosis not present

## 2019-12-06 DIAGNOSIS — Z85828 Personal history of other malignant neoplasm of skin: Secondary | ICD-10-CM | POA: Diagnosis not present

## 2019-12-06 DIAGNOSIS — L821 Other seborrheic keratosis: Secondary | ICD-10-CM | POA: Diagnosis not present

## 2019-12-06 DIAGNOSIS — L905 Scar conditions and fibrosis of skin: Secondary | ICD-10-CM | POA: Diagnosis not present

## 2019-12-06 DIAGNOSIS — L814 Other melanin hyperpigmentation: Secondary | ICD-10-CM | POA: Diagnosis not present

## 2019-12-06 DIAGNOSIS — D225 Melanocytic nevi of trunk: Secondary | ICD-10-CM | POA: Diagnosis not present

## 2020-02-06 DIAGNOSIS — F339 Major depressive disorder, recurrent, unspecified: Secondary | ICD-10-CM | POA: Diagnosis not present

## 2020-02-06 DIAGNOSIS — E1121 Type 2 diabetes mellitus with diabetic nephropathy: Secondary | ICD-10-CM | POA: Diagnosis not present

## 2020-02-06 DIAGNOSIS — G40911 Epilepsy, unspecified, intractable, with status epilepticus: Secondary | ICD-10-CM | POA: Diagnosis not present

## 2020-02-06 DIAGNOSIS — R059 Cough, unspecified: Secondary | ICD-10-CM | POA: Diagnosis not present

## 2020-02-06 DIAGNOSIS — Z0000 Encounter for general adult medical examination without abnormal findings: Secondary | ICD-10-CM | POA: Diagnosis not present

## 2020-02-06 DIAGNOSIS — Z6823 Body mass index (BMI) 23.0-23.9, adult: Secondary | ICD-10-CM | POA: Diagnosis not present

## 2020-02-06 DIAGNOSIS — Z8673 Personal history of transient ischemic attack (TIA), and cerebral infarction without residual deficits: Secondary | ICD-10-CM | POA: Diagnosis not present

## 2020-03-10 ENCOUNTER — Emergency Department (HOSPITAL_COMMUNITY): Payer: Medicare HMO

## 2020-03-10 ENCOUNTER — Other Ambulatory Visit: Payer: Self-pay

## 2020-03-10 ENCOUNTER — Encounter (HOSPITAL_COMMUNITY): Payer: Self-pay | Admitting: *Deleted

## 2020-03-10 ENCOUNTER — Emergency Department (HOSPITAL_COMMUNITY)
Admission: EM | Admit: 2020-03-10 | Discharge: 2020-03-10 | Disposition: A | Discharge status: Home / Self Care | Payer: Medicare HMO | Attending: Emergency Medicine | Admitting: Emergency Medicine

## 2020-03-10 DIAGNOSIS — W01198A Fall on same level from slipping, tripping and stumbling with subsequent striking against other object, initial encounter: Secondary | ICD-10-CM | POA: Insufficient documentation

## 2020-03-10 DIAGNOSIS — Z79899 Other long term (current) drug therapy: Secondary | ICD-10-CM | POA: Diagnosis not present

## 2020-03-10 DIAGNOSIS — Z87891 Personal history of nicotine dependence: Secondary | ICD-10-CM | POA: Diagnosis not present

## 2020-03-10 DIAGNOSIS — S52571A Other intraarticular fracture of lower end of right radius, initial encounter for closed fracture: Secondary | ICD-10-CM | POA: Diagnosis not present

## 2020-03-10 DIAGNOSIS — J449 Chronic obstructive pulmonary disease, unspecified: Secondary | ICD-10-CM | POA: Insufficient documentation

## 2020-03-10 DIAGNOSIS — S62101A Fracture of unspecified carpal bone, right wrist, initial encounter for closed fracture: Secondary | ICD-10-CM | POA: Diagnosis not present

## 2020-03-10 DIAGNOSIS — Z7982 Long term (current) use of aspirin: Secondary | ICD-10-CM | POA: Diagnosis not present

## 2020-03-10 DIAGNOSIS — S199XXA Unspecified injury of neck, initial encounter: Secondary | ICD-10-CM | POA: Diagnosis not present

## 2020-03-10 DIAGNOSIS — M7989 Other specified soft tissue disorders: Secondary | ICD-10-CM | POA: Diagnosis not present

## 2020-03-10 DIAGNOSIS — E119 Type 2 diabetes mellitus without complications: Secondary | ICD-10-CM | POA: Diagnosis not present

## 2020-03-10 DIAGNOSIS — S6291XA Unspecified fracture of right wrist and hand, initial encounter for closed fracture: Secondary | ICD-10-CM | POA: Diagnosis not present

## 2020-03-10 DIAGNOSIS — Z794 Long term (current) use of insulin: Secondary | ICD-10-CM | POA: Diagnosis not present

## 2020-03-10 DIAGNOSIS — M47812 Spondylosis without myelopathy or radiculopathy, cervical region: Secondary | ICD-10-CM | POA: Diagnosis not present

## 2020-03-10 DIAGNOSIS — M542 Cervicalgia: Secondary | ICD-10-CM | POA: Diagnosis not present

## 2020-03-10 DIAGNOSIS — Z7984 Long term (current) use of oral hypoglycemic drugs: Secondary | ICD-10-CM | POA: Insufficient documentation

## 2020-03-10 DIAGNOSIS — S52572A Other intraarticular fracture of lower end of left radius, initial encounter for closed fracture: Secondary | ICD-10-CM | POA: Diagnosis not present

## 2020-03-10 DIAGNOSIS — I10 Essential (primary) hypertension: Secondary | ICD-10-CM | POA: Insufficient documentation

## 2020-03-10 DIAGNOSIS — R519 Headache, unspecified: Secondary | ICD-10-CM | POA: Insufficient documentation

## 2020-03-10 DIAGNOSIS — S52501A Unspecified fracture of the lower end of right radius, initial encounter for closed fracture: Secondary | ICD-10-CM | POA: Diagnosis not present

## 2020-03-10 DIAGNOSIS — S0990XA Unspecified injury of head, initial encounter: Secondary | ICD-10-CM | POA: Diagnosis not present

## 2020-03-10 DIAGNOSIS — M25531 Pain in right wrist: Secondary | ICD-10-CM | POA: Diagnosis not present

## 2020-03-10 DIAGNOSIS — W19XXXA Unspecified fall, initial encounter: Secondary | ICD-10-CM

## 2020-03-10 DIAGNOSIS — S4991XA Unspecified injury of right shoulder and upper arm, initial encounter: Secondary | ICD-10-CM | POA: Diagnosis not present

## 2020-03-10 DIAGNOSIS — S6991XA Unspecified injury of right wrist, hand and finger(s), initial encounter: Secondary | ICD-10-CM | POA: Diagnosis present

## 2020-03-10 LAB — CBC WITH DIFFERENTIAL/PLATELET
Abs Immature Granulocytes: 0.07 10*3/uL (ref 0.00–0.07)
Basophils Absolute: 0.1 10*3/uL (ref 0.0–0.1)
Basophils Relative: 1 %
Eosinophils Absolute: 0.2 10*3/uL (ref 0.0–0.5)
Eosinophils Relative: 2 %
HCT: 41 % (ref 36.0–46.0)
Hemoglobin: 13.6 g/dL (ref 12.0–15.0)
Immature Granulocytes: 1 %
Lymphocytes Relative: 28 %
Lymphs Abs: 3 10*3/uL (ref 0.7–4.0)
MCH: 32.9 pg (ref 26.0–34.0)
MCHC: 33.2 g/dL (ref 30.0–36.0)
MCV: 99 fL (ref 80.0–100.0)
Monocytes Absolute: 0.9 10*3/uL (ref 0.1–1.0)
Monocytes Relative: 8 %
Neutro Abs: 6.5 10*3/uL (ref 1.7–7.7)
Neutrophils Relative %: 60 %
Platelets: 327 10*3/uL (ref 150–400)
RBC: 4.14 MIL/uL (ref 3.87–5.11)
RDW: 13.2 % (ref 11.5–15.5)
WBC: 10.8 10*3/uL — ABNORMAL HIGH (ref 4.0–10.5)
nRBC: 0 % (ref 0.0–0.2)

## 2020-03-10 LAB — BASIC METABOLIC PANEL
Anion gap: 13 (ref 5–15)
BUN: 17 mg/dL (ref 8–23)
CO2: 26 mmol/L (ref 22–32)
Calcium: 9.4 mg/dL (ref 8.9–10.3)
Chloride: 98 mmol/L (ref 98–111)
Creatinine, Ser: 0.92 mg/dL (ref 0.44–1.00)
GFR, Estimated: >60 mL/min (ref >60)
Glucose, Bld: 69 mg/dL — ABNORMAL LOW (ref 70–99)
Potassium: 3.6 mmol/L (ref 3.5–5.1)
Sodium: 137 mmol/L (ref 135–145)

## 2020-03-10 MED ORDER — KETAMINE HCL 10 MG/ML IJ SOLN
INTRAMUSCULAR | Status: AC | PRN
  Administered 2020-03-10: 54 mg via INTRAVENOUS

## 2020-03-10 MED ORDER — HYDROMORPHONE HCL 1 MG/ML IJ SOLN
1.0000 mg | Freq: Once | INTRAMUSCULAR | Status: DC
  Administered 2020-03-10: 17:00:00 1 mg via INTRAVENOUS
  Filled 2020-03-10: qty 1

## 2020-03-10 MED ORDER — PROMETHAZINE HCL 25 MG/ML IJ SOLN
12.5000 mg | Freq: Once | INTRAMUSCULAR | Status: AC
  Administered 2020-03-10: 18:00:00 12.5 mg via INTRAVENOUS
  Filled 2020-03-10: qty 1

## 2020-03-10 MED ORDER — FENTANYL CITRATE (PF) 100 MCG/2ML IJ SOLN: 100.0000 ug | Freq: Once | INTRAMUSCULAR | Status: DC

## 2020-03-10 MED ORDER — ONDANSETRON HCL 4 MG/2ML IJ SOLN: 4.0000 mg | Freq: Once | INTRAMUSCULAR | Status: DC

## 2020-03-10 MED ORDER — ONDANSETRON 4 MG PO TBDP: 4.0000 mg | ORAL_TABLET | Freq: Three times a day (TID) | ORAL | 0 refills | Status: DC | PRN

## 2020-03-10 MED ORDER — ONDANSETRON HCL 4 MG/2ML IJ SOLN
4.0000 mg | Freq: Once | INTRAMUSCULAR | Status: AC
  Administered 2020-03-10: 17:00:00 4 mg via INTRAVENOUS
  Filled 2020-03-10: qty 2

## 2020-03-10 MED ORDER — HYDROMORPHONE HCL 1 MG/ML IJ SOLN: 1.0000 mg | Freq: Once | INTRAMUSCULAR | Status: DC

## 2020-03-10 MED ORDER — KETAMINE HCL 10 MG/ML IJ SOLN
1.0000 mg/kg | Freq: Once | INTRAMUSCULAR | Status: AC
  Administered 2020-03-10: 22:00:00 54 mg via INTRAVENOUS
  Filled 2020-03-10: qty 1

## 2020-03-10 MED ORDER — HYDROCODONE-ACETAMINOPHEN 5-325 MG PO TABS: 1.0000 | ORAL_TABLET | ORAL | 0 refills | Status: DC | PRN

## 2020-03-10 NOTE — ED Triage Notes (Signed)
Pt tripped over her grandson's toy and fell, hitting her head on the wall after falling backwards landing her bottom. Pt states she hit right wrist, c/o pain to right arm and not able to straighten it out.  Deformity noted to right wrist.  Takes ASA daily.

## 2020-03-10 NOTE — Discharge Instructions (Signed)
Keep your splint on.  Do not get this wet.  Take the pain medicine as prescribed.  Do not drive or operate heavy machinery while taking this medication.  It may become addictive.  Follow-up with orthopedics.  Call on Tuesday to schedule appointment.  Emergency department if you have any new or worsening symptoms such as increased pain, changes in color of your fingertips, numbness to your arm.  Return for new or worsening symptoms.

## 2020-03-10 NOTE — ED Provider Notes (Signed)
.  Sedation  Date/Time: 03/10/2020 11:21 PM Performed by: Maudie Flakes, MD Authorized by: Maudie Flakes, MD   Consent:    Consent obtained:  Verbal and written   Consent given by:  Patient   Risks discussed:  Allergic reaction, inadequate sedation, dysrhythmia, nausea, prolonged hypoxia resulting in organ damage, respiratory compromise necessitating ventilatory assistance and intubation and vomiting Universal protocol:    Immediately prior to procedure, a time out was called: yes     Patient identity confirmed:  Arm band and hospital-assigned identification number Indications:    Procedure performed:  Fracture reduction   Procedure necessitating sedation performed by:  Physician performing sedation Pre-sedation assessment:    Time since last food or drink:  4 hours   ASA classification: class 2 - patient with mild systemic disease     Mouth opening:  3 or more finger widths   Mallampati score:  I - soft palate, uvula, fauces, pillars visible   Neck mobility: normal     Pre-sedation assessments completed and reviewed: airway patency, cardiovascular function, hydration status, mental status, nausea/vomiting, pain level, respiratory function and temperature   Immediate pre-procedure details:    Reviewed: vital signs and relevant labs/tests     Verified: bag valve mask available, emergency equipment available, intubation equipment available, IV patency confirmed, oxygen available and suction available   Procedure details (see MAR for exact dosages):    Preoxygenation:  Nasal cannula   Sedation:  Ketamine   Intended level of sedation: deep   Analgesia:  Hydromorphone   Intra-procedure monitoring:  Blood pressure monitoring, cardiac monitor, continuous pulse oximetry, continuous capnometry, frequent LOC assessments and frequent vital sign checks   Intra-procedure events: none     Total Provider sedation time (minutes):  18 Post-procedure details:    Attendance: Constant attendance by  certified staff until patient recovered     Recovery: Patient returned to pre-procedure baseline     Post-sedation assessments completed and reviewed: airway patency, cardiovascular function, hydration status, mental status, nausea/vomiting, pain level, respiratory function and temperature     Patient is stable for discharge or admission: yes     Procedure completion:  Tolerated well, no immediate complications      Maudie Flakes, MD 03/10/20 2323

## 2020-03-10 NOTE — ED Provider Notes (Signed)
[facility]   CSN: [id number] Arrival date & time: 03/10/20  1644     History Chief Complaint  Patient presents with  . Wrist Pain    Veronica HumblesJean Moses is a 64 y.o. female with history significant for diabetes, COPD, seizure, CVA who presents for evaluation after fall.  Tripped and fell over her grandsons toys during Christmas this morning falling backwards and hitting her head.  She fell with outstretched right wrist.  Obvious deformity to her wrist.  Takes aspirin.  Has headache, lightheadedness, dizziness, chest pain, shortness of breath, abdominal pain, diarrhea, dysuria, paresthesias, redness or warmth.  Has noted some swelling to her right wrist.  Rates her pain a 10/10.  Denies additional aggravating or alleviating factors.  History obtained from patient past medical records.  No interpreter used  HPI     Past Medical History:  Diagnosis Date  . COPD (chronic obstructive pulmonary disease) (HCC)   . Diabetes mellitus    Insulin Dependent. Adult onset  . Dyspnea    with exertion  . Heart murmur   . Hypertension   . Memory changes    since being on vent June 2017- "coming back some."  . Seizures (HCC) 08/2015  . Stroke (HCC)   . UTI (lower urinary tract infection)     Patient Active Problem List   Diagnosis Date Noted  . MDD (major depressive disorder), recurrent episode, moderate (HCC) 10/29/2016  . Hypokalemia   . Psychosocial stressors   . Acute encephalopathy   . Insulin dependent diabetes mellitus   . HCAP (healthcare-associated pneumonia) 08/27/2015  . Status epilepticus (HCC) 08/20/2015  . Seizure (HCC) 08/20/2015  . DKA (diabetic ketoacidoses) 08/20/2015  . Fall 05/07/2014  . History of sprained wrist 05/07/2014  . Protein-calorie malnutrition, severe (HCC) 10/25/2013  . Numbness, limb 10/24/2013  . Chest pain 05/30/2013  . Diabetes (HCC) 02/08/2013  . COPD (chronic obstructive pulmonary disease) (HCC) 02/08/2013  .  GERD (gastroesophageal reflux disease) 02/08/2013  . Abdominal pain, chronic, epigastric 02/08/2013    Past Surgical History:  Procedure Laterality Date  . ABDOMINAL HYSTERECTOMY    . BREAST SURGERY Left    cyst-  . CHOLECYSTECTOMY    . COLONOSCOPY WITH ESOPHAGOGASTRODUODENOSCOPY (EGD) N/A 03/04/2013   Procedure: COLONOSCOPY WITH ESOPHAGOGASTRODUODENOSCOPY (EGD);  Surgeon: Malissa HippoNajeeb U Rehman, MD;  Location: [facility] SUITE;  Service: Endoscopy;  Laterality: N/A;  1200-moved to 1535 Ann notified pt  . MULTIPLE EXTRACTIONS WITH ALVEOLOPLASTY N/A 05/30/2016   Procedure: MULTIPLE EXTRACTION WITH ALVEOLOPLASTY;  Surgeon: Ocie DoyneScott Jensen, DDS;  Location: [facility] OR;  Service: Oral Surgery;  Laterality: N/A;  . NEPHRECTOMY Left    Donatated to brother     OB History   No obstetric history on file.     Family History  Problem Relation Age of Onset  . Diabetes Father   . CAD Other        family history  . Diabetes Brother   . Colon cancer Neg Hx     Social History   Tobacco Use  . Smoking status: Former Smoker    Packs/day: 1.00    Years: 26.00    Pack years: 26.00    Types: Cigarettes    Quit date: 08/27/2015    Years since quitting: 4.5  . Smokeless tobacco: Never Used  Substance Use Topics  . Alcohol use: No  . Drug use: No    Home Medications Prior to Admission medications   Medication Sig Start Date End Date  Taking? Authorizing Provider  ALPRAZolam Prudy Feeler) 0.5 MG tablet Take 0.5 mg by mouth 3 (three) times daily as needed. 02/17/20  Yes [provider]  amLODipine (NORVASC) 5 MG tablet Take 5 mg by mouth daily.   Yes [provider]  aspirin 81 MG chewable tablet Chew 81 mg by mouth daily.   Yes [provider]  atorvastatin (LIPITOR) 20 MG tablet Take 20 mg by mouth daily.   Yes [provider]  doxylamine, Sleep, (UNISOM) 25 MG tablet Take 25 mg by mouth at bedtime as needed for sleep.   Yes [provider]  gabapentin (NEURONTIN) 100  MG capsule Take 100 mg by mouth at bedtime.   Yes [provider]  Insulin Glargine (LANTUS) 100 UNIT/ML Solostar Pen Inject 24 Units into the skin daily. 06/02/18  Yes [provider]  lacosamide (VIMPAT) 200 MG TABS tablet Take 200 mg by mouth 2 (two) times daily.   Yes [provider]  levETIRAcetam (KEPPRA) 1000 MG tablet Take 1,000 mg by mouth 2 (two) times daily. 02/29/20  Yes [provider]  lisinopril (ZESTRIL) 20 MG tablet Take 20 mg by mouth daily. 10/13/19  Yes [provider]  NOVOLOG FLEXPEN 100 UNIT/ML FlexPen Please use the hospital sliding scale provided for you Patient taking differently: Please use the hospital sliding scale provided for you take with each meal 08/25/15  Yes Kari Baars, MD  acetaminophen (TYLENOL) 325 MG tablet Take 325 mg by mouth every 6 (six) hours as needed for mild pain or moderate pain.    [provider]  acetaminophen-codeine (TYLENOL #3) 300-30 MG tablet Take 1-2 tablets by mouth every 6 (six) hours as needed for moderate pain. Patient not taking: No sig reported 04/13/19   Derwood Kaplan, MD  cephALEXin (KEFLEX) 500 MG capsule Take 1 capsule (500 mg total) by mouth 4 (four) times daily. Patient not taking: No sig reported 04/13/19   Derwood Kaplan, MD  cloNIDine (CATAPRES) 0.1 MG tablet Take one tablet every 12 hours if your blood pressure is greater than 180 systolic (upper number). Patient not taking: Reported on 03/10/2020 10/30/16   Geoffery Lyons, MD  HYDROcodone-acetaminophen (NORCO/VICODIN) 5-325 MG tablet Take 1 tablet by mouth every 4 (four) hours as needed. 03/10/20   Ellarie Picking A, PA-C  HYDROcodone-acetaminophen (NORCO/VICODIN) 5-325 MG tablet Take 1 tablet by mouth every 4 (four) hours as needed. 03/10/20   Gwenna Fuston A, PA-C  ibuprofen (ADVIL,MOTRIN) 200 MG tablet Take 200 mg by mouth every 6 (six) hours as needed for headache.    [provider]  lisinopril  (PRINIVIL,ZESTRIL) 40 MG tablet Take 40 mg by mouth daily. Patient not taking: Reported on 03/10/2020    [provider]  ondansetron (ZOFRAN ODT) 4 MG disintegrating tablet Take 1 tablet (4 mg total) by mouth every 8 (eight) hours as needed for nausea or vomiting. 03/10/20   Jaretzy Lhommedieu A, PA-C  RESTASIS 0.05 % ophthalmic emulsion Apply 1 drop to eye 2 (two) times daily. 04/29/14   [provider]    Allergies    Demerol, Depakote [divalproex sodium], Dilaudid [hydromorphone hcl], Doxycycline, and Valproic acid  Review of Systems   Review of Systems  Constitutional: Negative.   HENT: Negative.   Respiratory: Negative.   Cardiovascular: Negative.   Gastrointestinal: Negative.   Genitourinary: Negative.   Musculoskeletal:       Wrist pain  Skin: Negative.   Neurological: Negative.   All other systems reviewed and are negative.  Physical Exam Updated Vital Signs BP (!) 156/58   Pulse 72   Temp 98.1 F (36.7 C) (Oral)   Resp 13   Ht  (1.549 m)   Wt 54.4 kg   SpO2 92%   BMI 22.67 kg/m   Physical Exam Vitals and nursing note reviewed.  Constitutional:      General: She is not in acute distress.    Appearance: She is well-developed and well-nourished. She is not ill-appearing, toxic-appearing or diaphoretic.  HENT:     Head: Normocephalic and atraumatic.     Nose: Nose normal.     Mouth/Throat:     Mouth: Mucous membranes are moist.  Eyes:     Pupils: Pupils are equal, round, and reactive to light.  Neck:     Comments: Mild spinal tenderness.  Patient placed in c-collar. Cardiovascular:     Rate and Rhythm: Normal rate.     Pulses: Normal pulses and intact distal pulses.          Radial pulses are 2+ on the right side and 2+ on the left side.       Dorsalis pedis pulses are 2+ on the right side and 2+ on the left side.     Heart sounds: Normal heart sounds.  Pulmonary:     Effort: Pulmonary effort is normal. No respiratory distress.      Breath sounds: Normal breath sounds.     Comments: Clear to auscultation bilateral wheeze, rhonchi or rales.  Speaks in full sentences without difficulty Abdominal:     General: Bowel sounds are normal. There is no distension.     Palpations: Abdomen is soft. There is no mass.     Tenderness: There is no abdominal tenderness. There is no right CVA tenderness, left CVA tenderness or guarding.     Hernia: No hernia is present.     Comments: Soft, nontender without rebound or guarding.  Musculoskeletal:        General: Swelling, tenderness, deformity and signs of injury present.     Right wrist: Swelling, deformity, tenderness and bony tenderness present. Decreased range of motion.     Left wrist: Normal.       Hands:     Cervical back: Normal range of motion. Tenderness present.     Right lower leg: No edema.     Left lower leg: No edema.     Comments: Moves bilateral lower extremities at difficulty.  Full range of motion left upper extremity.  Diffuse tenderness to right forearm.  Obvious deformity to wrist.  Mild tenderness midshaft humerus.  No bony tenderness to right shoulder.  Able to full range of motion shoulder without difficulty.  No midline thoracic, lumbar spinal tenderness.  Skin:    General: Skin is warm and dry.     Capillary Refill: Capillary refill takes less than 2 seconds.     Comments: Soft tissue swelling to right wrist.  No lacerations, abrasions or contusions.  Neurological:     Mental Status: She is alert.     Comments: Equal handgrip.  Wiggles fingers.  Intact sensation bilateral upper extremities without difficulty  Psychiatric:        Mood and Affect: Mood and affect normal.    ED Results / Procedures / Treatments   Labs (all labs ordered are listed, but only abnormal results are displayed) Labs Reviewed  CBC WITH DIFFERENTIAL/PLATELET - Abnormal; Notable for the following components:      Result Value   WBC  10.8 (*)    All other components within normal  limits  BASIC METABOLIC PANEL - Abnormal; Notable for the following components:   Glucose, Bld 69 (*)    All other components within normal limits    EKG Veronica Moses  Radiology DG Forearm Right  Result Date: 03/10/2020 CLINICAL DATA:  Tripped over a child stool a and fell today landing on outstretched RIGHT upper extremity, RIGHT wrist pain and deformity, pain from RIGHT wrist up RIGHT arm EXAM: RIGHT FOREARM - 2 VIEW COMPARISON:  RIGHT wrist radiographs 03/10/2020 FINDINGS: Osseous demineralization. Comminuted displaced intra-articular distal RIGHT radial metaphyseal fracture with intra-articular extension at radiocarpal joint. Ulnar styloid fracture seen on wrist radiographic exam is not well visualized. Elbow joint alignment normal. No additional fracture, dislocation or bone destruction. IMPRESSION: Distal radial metaphyseal fracture, reported separately. Ulnar styloid fracture seen on wrist radiograph exam is not well visualized. No additional abnormalities. Electronically Signed   By: Lavonia Dana M.D.   On: 03/10/2020 18:52   DG Wrist Complete Right  Result Date: 03/10/2020 CLINICAL DATA:  64 year old female with right wrist fracture status post reduction. EXAM: RIGHT WRIST - COMPLETE 3+ VIEW COMPARISON:  Radiograph dated 03/10/2020. FINDINGS: Evaluation of the bones is limited due to overlying cast. Slight interval reduction of the angulation of the distal radial fracture. No new fracture. The bones are osteopenic. A cast is noted over the forearm. IMPRESSION: Interval reduction of the angulation of the distal radial fracture. Electronically Signed   By: Anner Crete M.D.   On: 03/10/2020 22:27   DG Wrist Complete Right  Result Date: 03/10/2020 CLINICAL DATA:  Tripped and fell over a toy landing on outstretched, pain and deformity RIGHT wrist, RIGHT wrist pain extending to RIGHT elbow and RIGHT shoulder RIGHT upper extremity today; associated nausea and vomiting EXAM: RIGHT WRIST -  COMPLETE 3+ VIEW COMPARISON:  Veronica Moses FINDINGS: Osseous demineralization. Comminuted displaced intra-articular distal radial metaphyseal fracture with apex volar angulation, dorsal displacement and marked dorsal tilt of distal radial articular surface. Ulnar styloid fracture. Acquired ulnar plus variance. Associated soft tissue swelling. No additional fracture or dislocation. IMPRESSION: Comminuted displaced and angulated distal RIGHT radial metaphyseal fracture with intra-articular extension at radiocarpal joint. Ulnar styloid fracture. Osseous demineralization. Electronically Signed   By: Lavonia Dana M.D.   On: 03/10/2020 18:50   CT Head Wo Contrast  Result Date: 03/10/2020 CLINICAL DATA:  64 year old female with head trauma. EXAM: CT HEAD WITHOUT CONTRAST CT CERVICAL SPINE WITHOUT CONTRAST TECHNIQUE: Multidetector CT imaging of the head and cervical spine was performed following the standard protocol without intravenous contrast. Multiplanar CT image reconstructions of the cervical spine were also generated. COMPARISON:  Head CT dated 10/30/2016. FINDINGS: CT HEAD FINDINGS Brain: Mild age-related atrophy and chronic microvascular ischemic changes. Fifty there is no acute intracranial hemorrhage. No mass effect or midline shift. No extra-axial fluid collection. Vascular: No hyperdense vessel or unexpected calcification. Skull: Normal. Negative for fracture or focal lesion. Sinuses/Orbits: No acute finding. Other: Veronica Moses CT CERVICAL SPINE FINDINGS Alignment: No acute subluxation. There is straightening of normal cervical lordosis which may be positional or due to muscle spasm. Skull base and vertebrae: No acute fracture. Osteopenia. Soft tissues and spinal canal: No prevertebral fluid or swelling. No visible canal hematoma. Disc levels:  Degenerative changes primarily at C5-C6. Upper chest: Negative. Other: Veronica Moses IMPRESSION: 1. No acute intracranial pathology. Mild age-related atrophy and chronic microvascular  ischemic changes. 2. No acute/traumatic cervical spine pathology. Electronically Signed   By: Anner Crete  M.D.   On: 03/10/2020 18:53   CT Cervical Spine Wo Contrast  Result Date: 03/10/2020 CLINICAL DATA:  64 year old female with head trauma. EXAM: CT HEAD WITHOUT CONTRAST CT CERVICAL SPINE WITHOUT CONTRAST TECHNIQUE: Multidetector CT imaging of the head and cervical spine was performed following the standard protocol without intravenous contrast. Multiplanar CT image reconstructions of the cervical spine were also generated. COMPARISON:  Head CT dated 10/30/2016. FINDINGS: CT HEAD FINDINGS Brain: Mild age-related atrophy and chronic microvascular ischemic changes. Fifty there is no acute intracranial hemorrhage. No mass effect or midline shift. No extra-axial fluid collection. Vascular: No hyperdense vessel or unexpected calcification. Skull: Normal. Negative for fracture or focal lesion. Sinuses/Orbits: No acute finding. Other: Veronica Moses CT CERVICAL SPINE FINDINGS Alignment: No acute subluxation. There is straightening of normal cervical lordosis which may be positional or due to muscle spasm. Skull base and vertebrae: No acute fracture. Osteopenia. Soft tissues and spinal canal: No prevertebral fluid or swelling. No visible canal hematoma. Disc levels:  Degenerative changes primarily at C5-C6. Upper chest: Negative. Other: Veronica Moses IMPRESSION: 1. No acute intracranial pathology. Mild age-related atrophy and chronic microvascular ischemic changes. 2. No acute/traumatic cervical spine pathology. Electronically Signed   By: Elgie Collard M.D.   On: 03/10/2020 18:53   DG Humerus Right  Result Date: 03/10/2020 CLINICAL DATA:  Tripped and fell today landing on outstretched RIGHT upper extremity, RIGHT wrist pain and deformity, pain from RIGHT wrist up RIGHT arm EXAM: RIGHT HUMERUS - 2+ VIEW COMPARISON:  Veronica Moses FINDINGS: Osseous demineralization. Shoulder and elbow joint alignments normal. No fracture,  dislocation, or bone destruction. IMPRESSION: No acute abnormalities. Electronically Signed   By: Ulyses Southward M.D.   On: 03/10/2020 18:51    Procedures .Splint Application  Date/Time: 03/10/2020 11:20 PM Performed by: Ralph Leyden A, PA-C Authorized by: Linwood Dibbles, PA-C   Consent:    Consent obtained:  Verbal   Consent given by:  Patient   Risks, benefits, and alternatives were discussed: yes     Risks discussed:  Discoloration, numbness, pain and swelling   Alternatives discussed:  Referral, observation, alternative treatment, delayed treatment and no treatment Universal protocol:    Procedure explained and questions answered to patient or proxy's satisfaction: yes     Relevant documents present and verified: yes     Test results available: yes     Imaging studies available: yes     Required blood products, implants, devices, and special equipment available: yes     Site/side marked: yes     Immediately prior to procedure a time out was called: yes     Patient identity confirmed:  Verbally with patient and arm band Pre-procedure details:    Distal neurologic exam:  Normal   Distal perfusion: distal pulses strong and brisk capillary refill   Procedure details:    Location:  Wrist   Wrist location:  R wrist   Strapping: yes     Cast type:  Short arm   Splint type:  Sugar tong   Supplies:  Elastic bandage, fiberglass, cotton padding and sling   Attestation: Splint applied and adjusted personally by me   Post-procedure details:    Distal neurologic exam:  Normal   Distal perfusion: distal pulses strong and brisk capillary refill     Procedure completion:  Tolerated well, no immediate complications   Post-procedure imaging: reviewed   Reduction of fracture  Date/Time: 03/10/2020 11:21 PM Performed by: Charyl Bigger Zakee Deerman A, PA-C Authorized by: Linwood Dibbles, PA-C  Consent: Verbal consent obtained. Written consent obtained. Risks and benefits: risks, benefits  and alternatives were discussed Consent given by: patient Patient understanding: patient states understanding of the procedure being performed Patient consent: the patient's understanding of the procedure matches consent given Procedure consent: procedure consent matches procedure scheduled Relevant documents: relevant documents present and verified Test results: test results available and properly labeled Site marked: the operative site was marked Imaging studies: imaging studies available Required items: required blood products, implants, devices, and special equipment available Patient identity confirmed: arm band and verbally with patient Time out: Immediately prior to procedure a "time out" was called to verify the correct patient, procedure, equipment, support staff and site/side marked as required. Preparation: Patient was prepped and draped in the usual sterile fashion. Local anesthesia used: no (See attending note)  Anesthesia: Local anesthesia used: no (See attending note)  Sedation: Patient sedated: yes Sedation type: moderate (conscious) sedation(See attending note for sedation documentation) Vitals: Vital signs were monitored during sedation.  Patient tolerance: patient tolerated the procedure well with no immediate complications    (including critical care time)  Medications Ordered in ED Medications  ondansetron (ZOFRAN) injection 4 mg (4 mg Intravenous Given 03/10/20 1725)  promethazine (PHENERGAN) injection 12.5 mg (12.5 mg Intravenous Given 03/10/20 1753)  ketamine (KETALAR) injection 54 mg (54 mg Intravenous Given 03/10/20 2203)  ketamine (KETALAR) injection (54 mg Intravenous Given 03/10/20 2212)   ED Course  I have reviewed the triage vital signs and the nursing notes.  Pertinent labs & imaging results that were available during my care of the patient were reviewed by me and considered in my medical decision making (see chart for details).  64 year old  presents for evaluation of mechanical fall.  Hit posterior aspect of her head.  Obvious deformity to right wrist however neurovascularly intact.  No lacerations, contusions or abrasions.  She is not anticoagulated.  No preceding symptoms prior to fall.  Heart lungs clear.  Abdomen soft, nontender.  Placed in c-collar due to midline spinal tenderness.  Labs, imaging and reassess  Labs and imaging personally reviewed and interpreted:  CBC with leukocytosis at 10.8 Metabolic panel with mildly low glucose  CT head without significant findings CT cervical spine no significant abnormality, removed c-collar Plain film right humerus without acute abnormality Plain film right wrist, forearm with comminuted, displaced, angulated distal right radial metaphyseal fracture with intra-articular extension.  CONSULT with Dr. Romeo Apple with Ortho, recommends attempt at reduction in the ED, splint and he will follow-up in office.  Patient did get sleepy and oxygen dropped to 89% with pain medicine.  Came up with 1 L via nasal cannula.  Resolved without hypoxia after pain medication wore off.  Patient with interval reduction in angulation of fracture post reduction films.  See procedure note for sedation from attending, Dr. Pilar Plate.  Splint in place.  Neurovascularly intact after splint placement.  We will have her follow-up with orthopedics.  The patient has been appropriately medically screened and/or stabilized in the ED. I have low suspicion for any other emergent medical condition which would require further screening, evaluation or treatment in the ED or require inpatient management.  Patient is hemodynamically stable and in no acute distress.  Patient able to ambulate in department prior to ED.  Evaluation does not show acute pathology that would require ongoing or additional emergent interventions while in the emergency department or further inpatient treatment.  I have discussed the diagnosis with the patient  and answered all questions.  Pain  is been managed while in the emergency department and patient has no further complaints prior to discharge.  Patient is comfortable with plan discussed in room and is stable for discharge at this time.  I have discussed strict return precautions for returning to the emergency department.  Patient was encouraged to follow-up with PCP/specialist refer to at discharge.    MDM Rules/Calculators/A&P                           Final Clinical Impression(s) / ED Diagnoses Final diagnoses:  Fall, initial encounter  Other closed intra-articular fracture of distal end of right radius, initial encounter    Rx / DC Orders ED Discharge Orders         Ordered    HYDROcodone-acetaminophen (NORCO/VICODIN) 5-325 MG tablet  Every 4 hours PRN        03/10/20 2251    HYDROcodone-acetaminophen (NORCO/VICODIN) 5-325 MG tablet  Every 4 hours PRN        03/10/20 2253    ondansetron (ZOFRAN ODT) 4 MG disintegrating tablet  Every 8 hours PRN        03/10/20 2253           Jakeem Grape A, PA-C 03/10/20 2324    Maudie Flakes, MD 03/10/20 [phone]

## 2020-03-10 NOTE — ED Notes (Signed)
Valentino Hue PA gave verbal order to give dilaudid 1 mg and will d/c fentanyl. PA spoke with pt about allergies and verbalized zofran will be given for nausea.

## 2020-03-12 MED FILL — Hydrocodone-Acetaminophen Tab 5-325 MG: ORAL | Qty: 6 | Status: AC

## 2020-03-13 ENCOUNTER — Ambulatory Visit (INDEPENDENT_AMBULATORY_CARE_PROVIDER_SITE_OTHER): Payer: Medicare HMO | Admitting: Orthopedic Surgery

## 2020-03-13 ENCOUNTER — Other Ambulatory Visit: Payer: Self-pay

## 2020-03-13 ENCOUNTER — Encounter: Payer: Self-pay | Admitting: Orthopedic Surgery

## 2020-03-13 VITALS — BP 139/96 | HR 107 | Ht 61.0 in | Wt 120.0 lb

## 2020-03-13 DIAGNOSIS — S52531A Colles' fracture of right radius, initial encounter for closed fracture: Secondary | ICD-10-CM | POA: Diagnosis not present

## 2020-03-13 MED ORDER — HYDROCODONE-ACETAMINOPHEN 5-325 MG PO TABS: 1.0000 | ORAL_TABLET | ORAL | 0 refills | Status: AC | PRN

## 2020-03-13 NOTE — Patient Instructions (Signed)
Keep elevated   A referral is being made to a hand specialist for surgery

## 2020-03-13 NOTE — Progress Notes (Signed)
Patient ID: Veronica Moses, female   DOB: [birth year], 64 y.o.   MRN: [id number]  ASSESSMENT AND PLAN  64 year old female right-hand-dominant with comminuted right wrist fracture, I feel the patient would do better with hand surgery to reconstruct her wrist based on the comminution and angulation and the horizontal split between the distal fragment  The splint was changed the skin was intact and volar splint was applied  Medication was refilled  Surgery required  Chief Complaint  Patient presents with  . Wrist Injury    03/10/20 fall wrist fracture closed reduction      HPI Veronica Moses is a 64 y.o. female.   64 year old right-hand-dominant female disabled no history of osteoporosis fell over a toy on Christmas Day injured her right wrist presented to the ER with a comminuted displaced angulated fracture of the right distal radius which was provisionally reduced closed and placed in a sugar tong splint presents with pain swelling and continued deformity on x-ray   Review of Systems Review of Systems  Neurological: Negative for numbness.  All other systems reviewed and are negative.    Past Medical History:  Diagnosis Date  . COPD (chronic obstructive pulmonary disease) ([place])   . Diabetes mellitus    Insulin Dependent. Adult onset  . Dyspnea    with exertion  . Heart murmur   . Hypertension   . Memory changes    since being on vent June 2017- "coming back some."  . Seizures ([place]) 08/2015  . Stroke ([place])   . UTI (lower urinary tract infection)     Past Surgical History:  Procedure Laterality Date  . ABDOMINAL HYSTERECTOMY    . BREAST SURGERY Left    cyst-  . CHOLECYSTECTOMY    . COLONOSCOPY WITH ESOPHAGOGASTRODUODENOSCOPY (EGD) N/A 03/04/2013   Procedure: COLONOSCOPY WITH ESOPHAGOGASTRODUODENOSCOPY (EGD);  Surgeon: Rogene Houston, MD;  [facility];  Service: Endoscopy;  Laterality: N/A;  1200-moved to [place] notified pt  . MULTIPLE EXTRACTIONS WITH  ALVEOLOPLASTY N/A 05/30/2016   Procedure: MULTIPLE EXTRACTION WITH ALVEOLOPLASTY;  Surgeon: Diona Browner, DDS;  Location: [facility];  Service: Oral Surgery;  Laterality: N/A;  . NEPHRECTOMY Left    Donatated to brother      Physical Exam 1 BP (!) 139/96   Pulse (!) 107   Ht 5\' 1"  (1.549 m)   Wt 120 lb (54.4 kg)   BMI 22.67 kg/m   2 The patient is well developed well nourished and well groomed. 3 Orientation to person place and time is normal  4 Mood is pleasant.  5 Ambulatory status normal no assist devices   6 Inspection of the right  wrist reveals  tenderness  Mild  swelling mild  deformity 7 Range of motion assessment: The range of motion is diminished primarily secondary to pain 8 Stability tests are deferred because of pain but the x-ray shows no subluxation of the joint 9 Strength assessment muscle tone is normal resistance testing is deferred because of pain and swelling  10 Nerve function normal  11 Vascular function normal  12 Local lymphatic system neg   Opposite extremity left arm  there is no alignment abnormality, no contracture, no subluxation, no atrophy and neurovascular exam is intact  MEDICAL DECISION MAKING  A.  Encounter Diagnosis  Name Primary?  . Closed Colles' fracture of right radius, initial encounter Yes    B. DATA ANALYSED:  Er record closed reduction   IMAGING: Independent interpretation of images: After looking at the  x-rays I interpret that this is a comminuted fracture of the left distal radius with persistent dorsal angulation of the distal fragment with a horizontal split in the distal fragment.  The post reduction films did not indicate that the fracture was reduced in an acceptable manner   Orders: none  Outside records reviewed: er record   C. MANAGEMENT referral for surgery   Meds ordered this encounter  Medications  . HYDROcodone-acetaminophen (NORCO/VICODIN) 5-325 MG tablet    Sig: Take 1 tablet by mouth every 4 (four)  hours as needed for up to 5 days.    Dispense:  30 tablet    Refill:  0

## 2020-03-14 ENCOUNTER — Other Ambulatory Visit: Payer: Self-pay | Admitting: Orthopedic Surgery

## 2020-03-14 DIAGNOSIS — S52591A Other fractures of lower end of right radius, initial encounter for closed fracture: Secondary | ICD-10-CM | POA: Diagnosis not present

## 2020-03-15 ENCOUNTER — Encounter (HOSPITAL_BASED_OUTPATIENT_CLINIC_OR_DEPARTMENT_OTHER): Payer: Self-pay | Admitting: Orthopedic Surgery

## 2020-03-15 ENCOUNTER — Other Ambulatory Visit: Payer: Self-pay

## 2020-03-16 DIAGNOSIS — Z6823 Body mass index (BMI) 23.0-23.9, adult: Secondary | ICD-10-CM | POA: Diagnosis not present

## 2020-03-16 DIAGNOSIS — S52561K Barton's fracture of right radius, subsequent encounter for closed fracture with nonunion: Secondary | ICD-10-CM | POA: Diagnosis not present

## 2020-03-19 ENCOUNTER — Other Ambulatory Visit (HOSPITAL_COMMUNITY): Payer: Medicare HMO

## 2020-03-20 ENCOUNTER — Other Ambulatory Visit (HOSPITAL_COMMUNITY)
Admission: RE | Admit: 2020-03-20 | Discharge: 2020-03-20 | Disposition: A | Discharge status: Home / Self Care | Payer: Medicare HMO | Source: Ambulatory Visit | Attending: Orthopedic Surgery | Admitting: Orthopedic Surgery

## 2020-03-20 ENCOUNTER — Encounter (HOSPITAL_BASED_OUTPATIENT_CLINIC_OR_DEPARTMENT_OTHER)
Admission: RE | Admit: 2020-03-20 | Discharge: 2020-03-20 | Disposition: A | Discharge status: Home / Self Care | Payer: Medicare HMO | Source: Ambulatory Visit | Attending: Orthopedic Surgery | Admitting: Orthopedic Surgery

## 2020-03-20 DIAGNOSIS — Z8673 Personal history of transient ischemic attack (TIA), and cerebral infarction without residual deficits: Secondary | ICD-10-CM | POA: Diagnosis not present

## 2020-03-20 DIAGNOSIS — Z01812 Encounter for preprocedural laboratory examination: Secondary | ICD-10-CM | POA: Diagnosis not present

## 2020-03-20 DIAGNOSIS — Z20822 Contact with and (suspected) exposure to covid-19: Secondary | ICD-10-CM | POA: Insufficient documentation

## 2020-03-20 DIAGNOSIS — Z905 Acquired absence of kidney: Secondary | ICD-10-CM | POA: Diagnosis not present

## 2020-03-20 DIAGNOSIS — Z01818 Encounter for other preprocedural examination: Secondary | ICD-10-CM | POA: Insufficient documentation

## 2020-03-20 DIAGNOSIS — S52551A Other extraarticular fracture of lower end of right radius, initial encounter for closed fracture: Secondary | ICD-10-CM | POA: Diagnosis not present

## 2020-03-20 DIAGNOSIS — F17210 Nicotine dependence, cigarettes, uncomplicated: Secondary | ICD-10-CM | POA: Diagnosis not present

## 2020-03-20 DIAGNOSIS — Z794 Long term (current) use of insulin: Secondary | ICD-10-CM | POA: Diagnosis not present

## 2020-03-20 DIAGNOSIS — W19XXXA Unspecified fall, initial encounter: Secondary | ICD-10-CM | POA: Diagnosis not present

## 2020-03-20 DIAGNOSIS — E119 Type 2 diabetes mellitus without complications: Secondary | ICD-10-CM | POA: Diagnosis not present

## 2020-03-20 LAB — BASIC METABOLIC PANEL
Anion gap: 9 (ref 5–15)
BUN: 14 mg/dL (ref 8–23)
CO2: 24 mmol/L (ref 22–32)
Calcium: 9.5 mg/dL (ref 8.9–10.3)
Chloride: 102 mmol/L (ref 98–111)
Creatinine, Ser: 0.88 mg/dL (ref 0.44–1.00)
GFR, Estimated: >60 mL/min (ref >60)
Glucose, Bld: 240 mg/dL — ABNORMAL HIGH (ref 70–99)
Potassium: 4.2 mmol/L (ref 3.5–5.1)
Sodium: 135 mmol/L (ref 135–145)

## 2020-03-20 LAB — SARS CORONAVIRUS 2 (TAT 6-24 HRS): SARS Coronavirus 2: NEGATIVE

## 2020-03-20 NOTE — Progress Notes (Signed)

## 2020-03-22 ENCOUNTER — Ambulatory Visit (HOSPITAL_BASED_OUTPATIENT_CLINIC_OR_DEPARTMENT_OTHER): Payer: Medicare HMO | Admitting: Certified Registered"

## 2020-03-22 ENCOUNTER — Encounter (HOSPITAL_BASED_OUTPATIENT_CLINIC_OR_DEPARTMENT_OTHER)
Admission: RE | Disposition: A | Discharge status: Home / Self Care | Payer: Self-pay | Source: Home / Self Care | Attending: Orthopedic Surgery

## 2020-03-22 ENCOUNTER — Other Ambulatory Visit: Payer: Self-pay

## 2020-03-22 ENCOUNTER — Ambulatory Visit (HOSPITAL_BASED_OUTPATIENT_CLINIC_OR_DEPARTMENT_OTHER)
Admission: RE | Admit: 2020-03-22 | Discharge: 2020-03-22 | Disposition: A | Discharge status: Home / Self Care | Payer: Medicare HMO | Attending: Orthopedic Surgery | Admitting: Orthopedic Surgery

## 2020-03-22 ENCOUNTER — Encounter (HOSPITAL_BASED_OUTPATIENT_CLINIC_OR_DEPARTMENT_OTHER): Payer: Self-pay | Admitting: Orthopedic Surgery

## 2020-03-22 DIAGNOSIS — S52551A Other extraarticular fracture of lower end of right radius, initial encounter for closed fracture: Secondary | ICD-10-CM | POA: Insufficient documentation

## 2020-03-22 DIAGNOSIS — F17210 Nicotine dependence, cigarettes, uncomplicated: Secondary | ICD-10-CM | POA: Insufficient documentation

## 2020-03-22 DIAGNOSIS — Z8673 Personal history of transient ischemic attack (TIA), and cerebral infarction without residual deficits: Secondary | ICD-10-CM | POA: Diagnosis not present

## 2020-03-22 DIAGNOSIS — J449 Chronic obstructive pulmonary disease, unspecified: Secondary | ICD-10-CM | POA: Diagnosis not present

## 2020-03-22 DIAGNOSIS — K219 Gastro-esophageal reflux disease without esophagitis: Secondary | ICD-10-CM | POA: Diagnosis not present

## 2020-03-22 DIAGNOSIS — Z794 Long term (current) use of insulin: Secondary | ICD-10-CM | POA: Insufficient documentation

## 2020-03-22 DIAGNOSIS — W19XXXA Unspecified fall, initial encounter: Secondary | ICD-10-CM | POA: Insufficient documentation

## 2020-03-22 DIAGNOSIS — E119 Type 2 diabetes mellitus without complications: Secondary | ICD-10-CM | POA: Insufficient documentation

## 2020-03-22 DIAGNOSIS — Z905 Acquired absence of kidney: Secondary | ICD-10-CM | POA: Insufficient documentation

## 2020-03-22 DIAGNOSIS — E876 Hypokalemia: Secondary | ICD-10-CM | POA: Diagnosis not present

## 2020-03-22 DIAGNOSIS — S52501A Unspecified fracture of the lower end of right radius, initial encounter for closed fracture: Secondary | ICD-10-CM | POA: Diagnosis not present

## 2020-03-22 HISTORY — DX: Anxiety disorder, unspecified: F41.9

## 2020-03-22 HISTORY — DX: Unspecified fracture of the lower end of right radius, initial encounter for closed fracture: S52.501A

## 2020-03-22 HISTORY — DX: Depression, unspecified: F32.A

## 2020-03-22 HISTORY — PX: OPEN REDUCTION INTERNAL FIXATION (ORIF) DISTAL RADIAL FRACTURE: SHX5989

## 2020-03-22 LAB — GLUCOSE, CAPILLARY
Glucose-Capillary: 137 mg/dL — ABNORMAL HIGH (ref 70–99)
Glucose-Capillary: 42 mg/dL — CL (ref 70–99)
Glucose-Capillary: 85 mg/dL (ref 70–99)

## 2020-03-22 SURGERY — OPEN REDUCTION INTERNAL FIXATION (ORIF) DISTAL RADIUS FRACTURE
Anesthesia: Monitor Anesthesia Care | Site: Wrist | Laterality: Right

## 2020-03-22 MED ORDER — CEFAZOLIN SODIUM-DEXTROSE 2-4 GM/100ML-% IV SOLN
INTRAVENOUS | Status: AC
  Filled 2020-03-22: qty 100

## 2020-03-22 MED ORDER — FENTANYL CITRATE (PF) 100 MCG/2ML IJ SOLN
100.0000 ug | Freq: Once | INTRAMUSCULAR | Status: AC
  Administered 2020-03-22: 50 ug via INTRAVENOUS

## 2020-03-22 MED ORDER — LACTATED RINGERS IV SOLN: INTRAVENOUS | Status: DC

## 2020-03-22 MED ORDER — ONDANSETRON HCL 4 MG/2ML IJ SOLN: 4.0000 mg | Freq: Once | INTRAMUSCULAR | Status: DC | PRN

## 2020-03-22 MED ORDER — MIDAZOLAM HCL 2 MG/2ML IJ SOLN
2.0000 mg | Freq: Once | INTRAMUSCULAR | Status: AC
  Administered 2020-03-22: 2 mg via INTRAVENOUS

## 2020-03-22 MED ORDER — FENTANYL CITRATE (PF) 100 MCG/2ML IJ SOLN
INTRAMUSCULAR | Status: AC
  Filled 2020-03-22: qty 2

## 2020-03-22 MED ORDER — PROPOFOL 500 MG/50ML IV EMUL
INTRAVENOUS | Status: DC | PRN
  Administered 2020-03-22: 75 ug/kg/min via INTRAVENOUS

## 2020-03-22 MED ORDER — HYDROCODONE-ACETAMINOPHEN 5-325 MG PO TABS: 1.0000 | ORAL_TABLET | Freq: Four times a day (QID) | ORAL | 0 refills | Status: DC | PRN

## 2020-03-22 MED ORDER — CEFAZOLIN SODIUM-DEXTROSE 2-4 GM/100ML-% IV SOLN
2.0000 g | INTRAVENOUS | Status: AC
  Administered 2020-03-22: 2 g via INTRAVENOUS

## 2020-03-22 MED ORDER — FENTANYL CITRATE (PF) 100 MCG/2ML IJ SOLN: 25.0000 ug | INTRAMUSCULAR | Status: DC | PRN

## 2020-03-22 MED ORDER — ONDANSETRON HCL 4 MG/2ML IJ SOLN
INTRAMUSCULAR | Status: DC | PRN
  Administered 2020-03-22: 4 mg via INTRAVENOUS

## 2020-03-22 MED ORDER — LIDOCAINE HCL (CARDIAC) PF 100 MG/5ML IV SOSY
PREFILLED_SYRINGE | INTRAVENOUS | Status: DC | PRN
  Administered 2020-03-22: 30 mg via INTRAVENOUS

## 2020-03-22 MED ORDER — MIDAZOLAM HCL 2 MG/2ML IJ SOLN
INTRAMUSCULAR | Status: AC
  Filled 2020-03-22: qty 2

## 2020-03-22 SURGICAL SUPPLY — 62 items
APL PRP STRL LF DISP 70% ISPRP (MISCELLANEOUS) ×1
BIT DRILL 2.0 LNG QUCK RELEASE (BIT) IMPLANT
BIT DRILL QC 2.8X5 (BIT) ×1 IMPLANT
BLADE MINI RND TIP GREEN BEAV (BLADE) IMPLANT
BLADE SURG 15 STRL LF DISP TIS (BLADE) ×1 IMPLANT
BLADE SURG 15 STRL SS (BLADE) ×2
BNDG CMPR 9X4 STRL LF SNTH (GAUZE/BANDAGES/DRESSINGS) ×1
BNDG COHESIVE 3X5 TAN STRL LF (GAUZE/BANDAGES/DRESSINGS) ×2 IMPLANT
BNDG ESMARK 4X9 LF (GAUZE/BANDAGES/DRESSINGS) ×2 IMPLANT
BNDG GAUZE ELAST 4 BULKY (GAUZE/BANDAGES/DRESSINGS) ×2 IMPLANT
CHLORAPREP W/TINT 26 (MISCELLANEOUS) ×2 IMPLANT
CORD BIPOLAR FORCEPS 12FT (ELECTRODE) ×2 IMPLANT
COVER BACK TABLE 60X90IN (DRAPES) ×2 IMPLANT
COVER MAYO STAND STRL (DRAPES) ×2 IMPLANT
COVER WAND RF STERILE (DRAPES) IMPLANT
CUFF TOURN SGL QUICK 18X4 (TOURNIQUET CUFF) ×1 IMPLANT
DECANTER SPIKE VIAL GLASS SM (MISCELLANEOUS) IMPLANT
DRAPE EXTREMITY T 121X128X90 (DISPOSABLE) ×2 IMPLANT
DRAPE OEC MINIVIEW 54X84 (DRAPES) ×2 IMPLANT
DRAPE SURG 17X23 STRL (DRAPES) ×2 IMPLANT
DRILL 2.0 LNG QUICK RELEASE (BIT) ×2
GAUZE SPONGE 4X4 12PLY STRL (GAUZE/BANDAGES/DRESSINGS) ×2 IMPLANT
GAUZE XEROFORM 1X8 LF (GAUZE/BANDAGES/DRESSINGS) ×2 IMPLANT
GLOVE BIO SURGEON STRL SZ7 (GLOVE) ×2 IMPLANT
GLOVE BIOGEL PI IND STRL 7.5 (GLOVE) IMPLANT
GLOVE BIOGEL PI IND STRL 8 (GLOVE) IMPLANT
GLOVE BIOGEL PI IND STRL 8.5 (GLOVE) ×1 IMPLANT
GLOVE BIOGEL PI INDICATOR 7.5 (GLOVE) ×1
GLOVE BIOGEL PI INDICATOR 8 (GLOVE) ×1
GLOVE BIOGEL PI INDICATOR 8.5 (GLOVE) ×1
GLOVE SURG ORTHO 8.0 STRL STRW (GLOVE) ×2 IMPLANT
GOWN STRL REUS W/ TWL LRG LVL3 (GOWN DISPOSABLE) ×1 IMPLANT
GOWN STRL REUS W/TWL LRG LVL3 (GOWN DISPOSABLE) ×2
GOWN STRL REUS W/TWL XL LVL3 (GOWN DISPOSABLE) ×3 IMPLANT
GUIDEWIRE ORTHO 0.054X6 (WIRE) ×3 IMPLANT
NDL PRECISIONGLIDE 27X1.5 (NEEDLE) IMPLANT
NEEDLE PRECISIONGLIDE 27X1.5 (NEEDLE) IMPLANT
NS IRRIG 1000ML POUR BTL (IV SOLUTION) ×2 IMPLANT
PACK BASIN DAY SURGERY FS (CUSTOM PROCEDURE TRAY) ×2 IMPLANT
PAD CAST 3X4 CTTN HI CHSV (CAST SUPPLIES) ×1 IMPLANT
PADDING CAST COTTON 3X4 STRL (CAST SUPPLIES) ×2
PLATE R NARROW PROC VDR (Plate) ×1 IMPLANT
SCREW BN FT 16X2.3XLCK HEX CRT (Screw) IMPLANT
SCREW CORT FT 18X2.3XLCK HEX (Screw) IMPLANT
SCREW CORTICAL LOCKING 2.3X16M (Screw) ×6 IMPLANT
SCREW CORTICAL LOCKING 2.3X18M (Screw) ×8 IMPLANT
SCREW FX16X2.3XLCK SMTH NS CRT (Screw) IMPLANT
SCREW FX18X2.3XSMTH LCK NS CRT (Screw) IMPLANT
SCREW NLCKG 13 3.5X13 HEXA (Screw) IMPLANT
SCREW NON LOCK 3.5X10MM (Screw) ×2 IMPLANT
SCREW NON TOGG 2.3X20MM (Screw) ×2 IMPLANT
SCREW NON-LOCK 3.5X13 (Screw) ×2 IMPLANT
SLEEVE SCD COMPRESS KNEE MED (MISCELLANEOUS) ×2 IMPLANT
SPLINT PLASTER CAST XFAST 3X15 (CAST SUPPLIES) IMPLANT
SPLINT PLASTER XTRA FASTSET 3X (CAST SUPPLIES)
STOCKINETTE 4X48 STRL (DRAPES) ×2 IMPLANT
SUT ETHILON 4 0 PS 2 18 (SUTURE) ×2 IMPLANT
SUT VICRYL 4-0 PS2 18IN ABS (SUTURE) ×2 IMPLANT
SYR BULB EAR ULCER 3OZ GRN STR (SYRINGE) ×2 IMPLANT
SYR CONTROL 10ML LL (SYRINGE) IMPLANT
TOWEL GREEN STERILE FF (TOWEL DISPOSABLE) ×2 IMPLANT
UNDERPAD 30X36 HEAVY ABSORB (UNDERPADS AND DIAPERS) ×2 IMPLANT

## 2020-03-22 NOTE — Anesthesia Postprocedure Evaluation (Signed)
Anesthesia Post Note  Patient: Veronica Moses  Procedure(s) Performed: OPEN REDUCTION INTERNAL FIXATION (ORIF) DISTAL RADIAL FRACTURE (Right Wrist)     Patient location during evaluation: PACU Anesthesia Type: Regional Level of consciousness: awake and alert Pain management: pain level controlled Vital Signs Assessment: post-procedure vital signs reviewed and stable Respiratory status: spontaneous breathing, nonlabored ventilation, respiratory function stable and patient connected to nasal cannula oxygen Cardiovascular status: stable and blood pressure returned to baseline Postop Assessment: no apparent nausea or vomiting Anesthetic complications: no   No complications documented.  Last Vitals:  Vitals:   03/22/20 1120 03/22/20 1239  BP:  (!) 144/45  Pulse: 64 68  Resp:  16  Temp:  36.4 C  SpO2: 98% 98%    Last Pain:  Vitals:   03/22/20 1239  TempSrc:   PainSc: 0-No pain                 Hani Patnode COKER

## 2020-03-22 NOTE — Transfer of Care (Signed)
Immediate Anesthesia Transfer of Care Note  Patient: Veronica Moses  Procedure(s) Performed: OPEN REDUCTION INTERNAL FIXATION (ORIF) DISTAL RADIAL FRACTURE (Right Wrist)  Patient Location: PACU  Anesthesia Type:MAC combined with regional for post-op pain  Level of Consciousness: awake, alert , oriented and patient cooperative  Airway & Oxygen Therapy: Patient Spontanous Breathing and Patient connected to face mask oxygen  Post-op Assessment: Report given to RN and Post -op Vital signs reviewed and stable  Post vital signs: Reviewed and stable  Last Vitals:  Vitals Value Taken Time  BP    Temp    Pulse    Resp    SpO2      Last Pain:  Vitals:   03/22/20 0902  TempSrc: Oral  PainSc: 5       Patients Stated Pain Goal: 3 (30/07/62 2633)  Complications: No complications documented.

## 2020-03-22 NOTE — Progress Notes (Signed)
Assisted Dr. Joslin with right, ultrasound guided, interscalene  block. Side rails up, monitors on throughout procedure. See vital signs in flow sheet. Tolerated Procedure well. 

## 2020-03-22 NOTE — Anesthesia Preprocedure Evaluation (Signed)
Anesthesia Evaluation  Patient identified by MRN, date of birth, ID band Patient awake    Reviewed: Allergy & Precautions, NPO status , Patient's Chart, lab work & pertinent test results  Airway Mallampati: II  TM Distance: >3 FB Neck ROM: Full    Dental  (+) Edentulous Upper, Edentulous Lower   Pulmonary Current Smoker,    breath sounds clear to auscultation       Cardiovascular hypertension,  Rhythm:Regular Rate:Normal     Neuro/Psych    GI/Hepatic   Endo/Other  diabetes  Renal/GU      Musculoskeletal   Abdominal   Peds  Hematology   Anesthesia Other Findings   Reproductive/Obstetrics                             Anesthesia Physical Anesthesia Plan  ASA: III  Anesthesia Plan: MAC and Regional   Post-op Pain Management:    Induction: Intravenous  PONV Risk Score and Plan: Ondansetron and Propofol infusion  Airway Management Planned: Natural Airway and Simple Face Mask  Additional Equipment:   Intra-op Plan:   Post-operative Plan:   Informed Consent: I have reviewed the patients History and Physical, chart, labs and discussed the procedure including the risks, benefits and alternatives for the proposed anesthesia with the patient or authorized representative who has indicated his/her understanding and acceptance.       Plan Discussed with: CRNA and Anesthesiologist  Anesthesia Plan Comments:         Anesthesia Quick Evaluation

## 2020-03-22 NOTE — Op Note (Signed)
I assisted Surgeon(s) and Role:    * Cindee Salt, MD - Primary    Betha Loa, MD on the Procedure(s): OPEN REDUCTION INTERNAL FIXATION (ORIF) DISTAL RADIAL FRACTURE on 03/22/2020.  I provided assistance on this case as follows: retraction soft tissues, clearing of fracture, placement pins.  Electronically signed by: Betha Loa, MD Date: 03/22/2020 Time: 12:36 PM

## 2020-03-22 NOTE — Anesthesia Procedure Notes (Signed)
Anesthesia Regional Block: Supraclavicular block   Pre-Anesthetic Checklist: ,, timeout performed, Correct Patient, Correct Site, Correct Laterality, Correct Procedure, Correct Position, site marked, Risks and benefits discussed,  Surgical consent,  Pre-op evaluation,  At surgeon's request and post-op pain management  Laterality: Right  Prep: chloraprep       Needles:  Injection technique: Single-shot  Needle Type: Stimulator Needle - 80          Additional Needles:   Procedures:, nerve stimulator,,,,,,,  Narrative:  Start time: 03/22/2020 10:25 AM End time: 03/22/2020 10:35 AM Injection made incrementally with aspirations every 5 mL.  Performed by: Personally  Anesthesiologist: Kipp Brood, MD  Additional Notes: 20 cc 0.75% Bupivacaine injected easily

## 2020-03-22 NOTE — Discharge Instructions (Addendum)
Post Anesthesia Home Care Instructions  Activity: Get plenty of rest for the remainder of the day. A responsible individual must stay with you for 24 hours following the procedure.  For the next 24 hours, DO NOT: -Drive a car -Operate machinery -Drink alcoholic beverages -Take any medication unless instructed by your physician -Make any legal decisions or sign important papers.  Meals: Start with liquid foods such as gelatin or soup. Progress to regular foods as tolerated. Avoid greasy, spicy, heavy foods. If nausea and/or vomiting occur, drink only clear liquids until the nausea and/or vomiting subsides. Call your physician if vomiting continues.  Special Instructions/Symptoms: Your throat may feel dry or sore from the anesthesia or the breathing tube placed in your throat during surgery. If this causes discomfort, gargle with warm salt water. The discomfort should disappear within 24 hours.  If you had a scopolamine patch placed behind your ear for the management of post- operative nausea and/or vomiting:  1. The medication in the patch is effective for 72 hours, after which it should be removed.  Wrap patch in a tissue and discard in the trash. Wash hands thoroughly with soap and water. 2. You may remove the patch earlier than 72 hours if you experience unpleasant side effects which may include dry mouth, dizziness or visual disturbances. 3. Avoid touching the patch. Wash your hands with soap and water after contact with the patch.      Regional Anesthesia Blocks  1. Numbness or the inability to move the "blocked" extremity may last from 3-48 hours after placement. The length of time depends on the medication injected and your individual response to the medication. If the numbness is not going away after 48 hours, call your surgeon.  2. The extremity that is blocked will need to be protected until the numbness is gone and the  Strength has returned. Because you cannot feel it, you  will need to take extra care to avoid injury. Because it may be weak, you may have difficulty moving it or using it. You may not know what position it is in without looking at it while the block is in effect.  3. For blocks in the legs and feet, returning to weight bearing and walking needs to be done carefully. You will need to wait until the numbness is entirely gone and the strength has returned. You should be able to move your leg and foot normally before you try and bear weight or walk. You will need someone to be with you when you first try to ensure you do not fall and possibly risk injury.  4. Bruising and tenderness at the needle site are common side effects and will resolve in a few days.  5. Persistent numbness or new problems with movement should be communicated to the surgeon or the [facility] ([phone])/ [facility] ([phone]).    Hand Center Instructions Hand Surgery  Wound Care: Keep your hand elevated above the level of your heart.  Do not allow it to dangle by your side.  Keep the dressing dry and do not remove it unless your doctor advises you to do so.  He will usually change it at the time of your post-op visit.  Moving your fingers is advised to stimulate circulation but will depend on the site of your surgery.  If you have a splint applied, your doctor will advise you regarding movement.  Activity: Do not drive or operate machinery today.  Rest today and   then you may return to your normal activity and work as indicated by your physician.  Diet:  Drink liquids today or eat a light diet.  You may resume a regular diet tomorrow.    General expectations: Pain for two to three days. Fingers may become slightly swollen.  Call your doctor if any of the following occur: Severe pain not relieved by pain medication. Elevated temperature. Dressing soaked with blood. Inability to move fingers. White or bluish color to fingers.  

## 2020-03-22 NOTE — Anesthesia Procedure Notes (Signed)
Procedure Name: MAC Date/Time: 03/22/2020 11:37 AM Performed by: Signe Colt, CRNA Pre-anesthesia Checklist: Patient identified, Emergency Drugs available, Suction available, Patient being monitored and Timeout performed Patient Re-evaluated:Patient Re-evaluated prior to induction Oxygen Delivery Method: Simple face mask

## 2020-03-22 NOTE — Op Note (Signed)
NAME: Emeryn Stryjewski MEDICAL RECORD NO: [id number] DATE OF BIRTH: 07/23/1955 FAC[facility] LOCATION: [facility] PHYSICIAN: Wynonia Sours, MD   OPERATIVE REPORT   DATE OF PROCEDURE: 03/22/20    PREOPERATIVE DIAGNOSIS:   Distal radius fracture right   POSTOPERATIVE DIAGNOSIS:   Same   PROCEDURE:   Open reduction internal fixation right distal radius fracture with Acumed volar plate   SURGEON: Daryll Brod, M.D.   ASSISTANT: Leanora Cover, MD   ANESTHESIA:  Regional with sedation   INTRAVENOUS FLUIDS:  Per anesthesia flow sheet.   ESTIMATED BLOOD LOSS:  Minimal.   COMPLICATIONS:  None.   SPECIMENS:  none   TOURNIQUET TIME:    Total Tourniquet Time Documented: Upper Arm (Right) - 57 minutes Total: Upper Arm (Right) - 57 minutes    DISPOSITION:  Stable to PACU.   INDICATIONS: Patient is a 65 year old female who sustained a fall resulting in a fracture of her right distal radius.  Attempted reduction was unsuccessful elsewhere.  She was referred for continued treatment she is scheduled for open reduction internal fixation right distal radius fracture. 3.  Postoperative course been discussed along with risk complications.  She is aware that there is no guarantee to the surgery the possibility of infection recurrence injury to arteries nerves tendons complete relief symptoms and dystrophy.  The preoperative area the patient is seen the extremity marked by both patient and surgeon antibiotic was given.  A supraclavicular block was carried out under the direction the anesthesia department in the preoperative area  OPERATIVE COURSE: Patient is brought to the operating room placed in the supine position with right arm free.  She was prepped using ChloraPrep 3-minute dry time allowed timeout taken confirmed patient procedure the limb was exsanguinated with an Esmarch bandage turn placed high in the arm was inflated to 250 mmHg.  Volar incision was made for application of a  volar distal plate.  A dart was made distally at the level of the styloid.  Dissection was carried down through subcutaneous tissue.  Bleeders were electrocauterized with bipolar as necessary.  The dissection was carried fluid through the flexor carpi radialis tendon sheath.  Care was taken to protect the radial artery especially distal volar branch.  The dissection was carried down to the pronator quadratus.  The brachial radialis was identified isolated and tenotomized.  An incision was made through the pronator quadratus with a T distally.  This was then elevated off the distal radius allowing visualization of the distal radius fracture.  The fracture was debrided and reduced with manipulation after retractors were placed.  A narrow Acumed DVR plate was then selected placed pinned in position x-rays were taken as necessary to confirm positioning with 2-3 attempts made to adequately position.  This was felt to be in good position and a proximal gliding screw was placed this was 10 mm.  The distal pegs were then placed first from the ulnar side measuring 16, 18 20 mm.  The plate was felt to be not be adequately down on the distal fragment and the radial and ulnar pin were replaced with nonlocking screws which brought the fracture up to the plate.  The radial screws were then placed these measured 16 and 18 mm.  The final ulnar locking peg was then placed securing the distal fragment in position this was 18 mm.  The 2 nonlocking screws were then replaced with a 16 and 18 mm peg.  The remaining 2 screws proximally were placed  at 13 and 10 mm strays taken AP lateral and oblique reveal the plate in good position down on the fragment distally.  There were no penetration into the radius and the radius was well reduced pronation supination was complete without crepitation.  The wound was copiously irrigated with saline.  The pronator quadratus was repaired as much as possible with figure-of-eight 4-0 Vicryl sutures and  subcutaneous tissue with interrupted 4-0 Vicryl and the skin with interrupted 4-0 nylon sutures.  A sterile compressive dressing volar splint was applied.  Inflation of the tourniquet all fingers immediately pink.  She was taken to the recovery room for observation in satisfactory condition.  She will be discharged home to return to the hand center in 1 week on Norco.  This 07/18/23 #20.   Cindee Salt, MD Electronically signed, 03/22/20

## 2020-03-22 NOTE — H&P (Signed)
Veronica Moses is an 65 y.o. female.   Chief Complaint: pain right wrist HPI: Ms Veronica Moses is a 65 year old rt handed female  who has a history of diabetes COPD seizures and CVA who sustained a fall on 03/10/2020. She was seen at [facility] where a reduction was performed and she was referred to [facility] orthopedics who saw her and has referred her.. She is felt to have an intra-articular extension of her fracture. She has a history of seizures last being in early 2021. She was placed in a volar splint and on Norco. Is complaining of mild pain. States that she has been keeping it elevated. She has no prior history of injury. She does have history of diabetes with no history of thyroid problems arthritis or gout. Family history is negative for each of these.    Past Medical History:  Diagnosis Date  . Anxiety   . Closed fracture of right distal radius   . COPD (chronic obstructive pulmonary disease) ([facility])   . Depression   . Diabetes mellitus    Insulin Dependent. Adult onset  . Dyspnea    with exertion  . Heart murmur   . Hypertension   . Memory changes    since being on vent June 2017- "coming back some."  . Seizures ([facility]) 08/2015   last seizure 07-2019  . Stroke [facility]) 2016   remote, no deficits  . UTI (lower urinary tract infection)     Past Surgical History:  Procedure Laterality Date  . ABDOMINAL HYSTERECTOMY    . BREAST SURGERY Left    cyst-  . CHOLECYSTECTOMY    . COLONOSCOPY WITH ESOPHAGOGASTRODUODENOSCOPY (EGD) N/A 03/04/2013   Procedure: COLONOSCOPY WITH ESOPHAGOGASTRODUODENOSCOPY (EGD);  Surgeon: Malissa Hippo, MD;  [facility];  Service: Endoscopy;  Laterality: N/A;  1200-moved to [place] [place] notified pt  . MULTIPLE EXTRACTIONS WITH ALVEOLOPLASTY N/A 05/30/2016   Procedure: MULTIPLE EXTRACTION WITH ALVEOLOPLASTY;  Surgeon: Ocie Doyne, DDS;  Location: [facility] OR;  Service: Oral Surgery;  Laterality: N/A;  . NEPHRECTOMY Left    Donatated to brother    Family History   Problem Relation Age of Onset  . Diabetes Father   . CAD Other        family history  . Diabetes Brother   . Colon cancer Neg Hx    Social History:  reports that she has been smoking cigarettes. She has a 26.00 pack-year smoking history. She has never used smokeless tobacco. She reports that she does not drink alcohol and does not use drugs.  Allergies:  Allergies  Allergen Reactions  . Demerol Other (See Comments)    hallucinations  . Depakote [Divalproex Sodium] Other (See Comments)    Sore/blisters  . Dilaudid [Hydromorphone Hcl] Nausea And Vomiting  . Doxycycline Other (See Comments)    "Blistered"  . Valproic Acid Rash    No medications prior to admission.    Results for orders placed or performed during the hospital encounter of 03/22/20 (from the past 48 hour(s))  Basic metabolic panel per protocol     Status: Abnormal   Collection Time: 03/20/20 10:42 AM  Result Value Ref Range   Sodium 135 135 - 145 mmol/L   Potassium 4.2 3.5 - 5.1 mmol/L   Chloride 102 98 - 111 mmol/L   CO2 24 22 - 32 mmol/L   Glucose, Bld 240 (H) 70 - 99 mg/dL    Comment: Glucose reference range applies only to samples taken after fasting for at least  8 hours.   BUN 14 8 - 23 mg/dL   Creatinine, Ser 8.82 0.44 - 1.00 mg/dL   Calcium 9.5 8.9 - 80.0 mg/dL   GFR, Estimated >34 >91 mL/min    Comment: (NOTE) Calculated using the CKD-EPI Creatinine Equation (2021)    Anion gap 9 5 - 15    Comment: Performed at [facility], [address] [place], [place] [address]    No results found.   Pertinent items are noted in HPI.  Height 5\' 1"  (1.549 m), weight 54.9 kg.  General appearance: alert, cooperative and appears stated age Head: Normocephalic, without obvious abnormality Neck: no JVD Resp: clear to auscultation bilaterally Cardio: regular rate and rhythm, S1, S2 normal, no murmur, click, rub or gallop GI: soft, non-tender; bowel sounds normal; no masses,  no  organomegaly Extremities: pain right wrist Pulses: 2+ and symmetric Skin: Skin color, texture, turgor normal. No rashes or lesions Neurologic: Grossly normal Incision/Wound: na  Assessment/Plan  Diagnosis is right distal radius fracture. Plan: We will schedule for open reduction internal fixation. Prepare postoperative course been discussed along with risks and complications. They are aware that there is no guarantee of the surgery the possibility of infection recurrence injury to arteries nerves tendons complete relief symptoms dystrophy. Her notes from [facility] are reviewed. This will be scheduled as an outpatient under regional anesthesia with the possibility of bone grafting as necessary.      [facility] 03/22/2020, 5:33 AM

## 2020-03-23 ENCOUNTER — Encounter (HOSPITAL_BASED_OUTPATIENT_CLINIC_OR_DEPARTMENT_OTHER): Payer: Self-pay | Admitting: Orthopedic Surgery

## 2020-03-26 LAB — GLUCOSE, CAPILLARY: Glucose-Capillary: 42 mg/dL — CL (ref 70–99)

## 2020-03-30 DIAGNOSIS — M2560 Stiffness of unspecified joint, not elsewhere classified: Secondary | ICD-10-CM | POA: Diagnosis not present

## 2020-03-30 DIAGNOSIS — S52591A Other fractures of lower end of right radius, initial encounter for closed fracture: Secondary | ICD-10-CM | POA: Diagnosis not present

## 2020-05-04 DIAGNOSIS — S52591D Other fractures of lower end of right radius, subsequent encounter for closed fracture with routine healing: Secondary | ICD-10-CM | POA: Diagnosis not present

## 2020-05-08 DIAGNOSIS — E1121 Type 2 diabetes mellitus with diabetic nephropathy: Secondary | ICD-10-CM | POA: Diagnosis not present

## 2020-05-08 DIAGNOSIS — S52561K Barton's fracture of right radius, subsequent encounter for closed fracture with nonunion: Secondary | ICD-10-CM | POA: Diagnosis not present

## 2020-05-08 DIAGNOSIS — F419 Anxiety disorder, unspecified: Secondary | ICD-10-CM | POA: Diagnosis not present

## 2020-05-08 DIAGNOSIS — Z6824 Body mass index (BMI) 24.0-24.9, adult: Secondary | ICD-10-CM | POA: Diagnosis not present

## 2020-05-08 DIAGNOSIS — I10 Essential (primary) hypertension: Secondary | ICD-10-CM | POA: Diagnosis not present

## 2020-06-01 DIAGNOSIS — S52591A Other fractures of lower end of right radius, initial encounter for closed fracture: Secondary | ICD-10-CM | POA: Diagnosis not present

## 2020-06-04 DIAGNOSIS — L821 Other seborrheic keratosis: Secondary | ICD-10-CM | POA: Diagnosis not present

## 2020-06-04 DIAGNOSIS — D225 Melanocytic nevi of trunk: Secondary | ICD-10-CM | POA: Diagnosis not present

## 2020-06-04 DIAGNOSIS — L570 Actinic keratosis: Secondary | ICD-10-CM | POA: Diagnosis not present

## 2020-06-08 ENCOUNTER — Other Ambulatory Visit: Payer: Self-pay | Admitting: Orthopedic Surgery

## 2020-06-08 ENCOUNTER — Other Ambulatory Visit: Payer: Self-pay

## 2020-06-08 DIAGNOSIS — S52591A Other fractures of lower end of right radius, initial encounter for closed fracture: Secondary | ICD-10-CM

## 2020-06-29 ENCOUNTER — Other Ambulatory Visit: Payer: Self-pay

## 2020-06-29 ENCOUNTER — Ambulatory Visit
Admission: RE | Admit: 2020-06-29 | Discharge: 2020-06-29 | Disposition: A | Discharge status: Home / Self Care | Payer: Medicare HMO | Source: Ambulatory Visit | Attending: Orthopedic Surgery | Admitting: Orthopedic Surgery

## 2020-06-29 DIAGNOSIS — S52501D Unspecified fracture of the lower end of right radius, subsequent encounter for closed fracture with routine healing: Secondary | ICD-10-CM | POA: Diagnosis not present

## 2020-06-29 DIAGNOSIS — Z9889 Other specified postprocedural states: Secondary | ICD-10-CM | POA: Diagnosis not present

## 2020-06-29 DIAGNOSIS — S52591A Other fractures of lower end of right radius, initial encounter for closed fracture: Secondary | ICD-10-CM

## 2020-07-06 DIAGNOSIS — S52591A Other fractures of lower end of right radius, initial encounter for closed fracture: Secondary | ICD-10-CM | POA: Diagnosis not present

## 2020-08-02 ENCOUNTER — Other Ambulatory Visit: Payer: Self-pay

## 2020-08-02 ENCOUNTER — Ambulatory Visit
Admission: EM | Admit: 2020-08-02 | Discharge: 2020-08-02 | Discharge status: Against Medical Advice | Payer: Medicare HMO

## 2020-08-03 ENCOUNTER — Ambulatory Visit: Payer: Self-pay

## 2020-08-27 DIAGNOSIS — Z6824 Body mass index (BMI) 24.0-24.9, adult: Secondary | ICD-10-CM | POA: Diagnosis not present

## 2020-08-27 DIAGNOSIS — Z0000 Encounter for general adult medical examination without abnormal findings: Secondary | ICD-10-CM | POA: Diagnosis not present

## 2020-08-27 DIAGNOSIS — F419 Anxiety disorder, unspecified: Secondary | ICD-10-CM | POA: Diagnosis not present

## 2020-08-27 DIAGNOSIS — I10 Essential (primary) hypertension: Secondary | ICD-10-CM | POA: Diagnosis not present

## 2020-08-27 DIAGNOSIS — E1121 Type 2 diabetes mellitus with diabetic nephropathy: Secondary | ICD-10-CM | POA: Diagnosis not present

## 2020-08-27 DIAGNOSIS — S52561K Barton's fracture of right radius, subsequent encounter for closed fracture with nonunion: Secondary | ICD-10-CM | POA: Diagnosis not present

## 2020-08-27 DIAGNOSIS — R197 Diarrhea, unspecified: Secondary | ICD-10-CM | POA: Diagnosis not present

## 2020-08-28 DIAGNOSIS — R197 Diarrhea, unspecified: Secondary | ICD-10-CM | POA: Diagnosis not present

## 2020-11-08 ENCOUNTER — Telehealth (INDEPENDENT_AMBULATORY_CARE_PROVIDER_SITE_OTHER): Payer: Self-pay | Admitting: *Deleted

## 2020-11-08 NOTE — Telephone Encounter (Signed)
Patient's daughter, Sheria Lang, called to have Ms Backs records transferred to [facility]. I explained once Ms Riggins sees a provider at [facility] she will be considered their patient. Ms Bayard Males understood. Records have been sent

## 2020-12-03 DIAGNOSIS — E1121 Type 2 diabetes mellitus with diabetic nephropathy: Secondary | ICD-10-CM | POA: Diagnosis not present

## 2020-12-03 DIAGNOSIS — Z6822 Body mass index (BMI) 22.0-22.9, adult: Secondary | ICD-10-CM | POA: Diagnosis not present

## 2020-12-03 DIAGNOSIS — F419 Anxiety disorder, unspecified: Secondary | ICD-10-CM | POA: Diagnosis not present

## 2020-12-03 DIAGNOSIS — I700 Atherosclerosis of aorta: Secondary | ICD-10-CM | POA: Diagnosis not present

## 2020-12-03 DIAGNOSIS — J449 Chronic obstructive pulmonary disease, unspecified: Secondary | ICD-10-CM | POA: Diagnosis not present

## 2020-12-03 DIAGNOSIS — I10 Essential (primary) hypertension: Secondary | ICD-10-CM | POA: Diagnosis not present

## 2020-12-05 DIAGNOSIS — L814 Other melanin hyperpigmentation: Secondary | ICD-10-CM | POA: Diagnosis not present

## 2020-12-05 DIAGNOSIS — D485 Neoplasm of uncertain behavior of skin: Secondary | ICD-10-CM | POA: Diagnosis not present

## 2020-12-05 DIAGNOSIS — L821 Other seborrheic keratosis: Secondary | ICD-10-CM | POA: Diagnosis not present

## 2020-12-19 DIAGNOSIS — Z1211 Encounter for screening for malignant neoplasm of colon: Secondary | ICD-10-CM | POA: Diagnosis not present

## 2020-12-19 DIAGNOSIS — Z9049 Acquired absence of other specified parts of digestive tract: Secondary | ICD-10-CM | POA: Diagnosis not present

## 2020-12-19 DIAGNOSIS — R109 Unspecified abdominal pain: Secondary | ICD-10-CM | POA: Diagnosis not present

## 2020-12-19 DIAGNOSIS — K529 Noninfective gastroenteritis and colitis, unspecified: Secondary | ICD-10-CM | POA: Diagnosis not present

## 2021-02-04 DIAGNOSIS — L905 Scar conditions and fibrosis of skin: Secondary | ICD-10-CM | POA: Diagnosis not present

## 2021-02-04 DIAGNOSIS — K529 Noninfective gastroenteritis and colitis, unspecified: Secondary | ICD-10-CM | POA: Diagnosis not present

## 2021-03-04 DIAGNOSIS — E782 Mixed hyperlipidemia: Secondary | ICD-10-CM | POA: Diagnosis not present

## 2021-03-04 DIAGNOSIS — F419 Anxiety disorder, unspecified: Secondary | ICD-10-CM | POA: Diagnosis not present

## 2021-03-04 DIAGNOSIS — I700 Atherosclerosis of aorta: Secondary | ICD-10-CM | POA: Diagnosis not present

## 2021-03-04 DIAGNOSIS — Z6821 Body mass index (BMI) 21.0-21.9, adult: Secondary | ICD-10-CM | POA: Diagnosis not present

## 2021-03-04 DIAGNOSIS — E1121 Type 2 diabetes mellitus with diabetic nephropathy: Secondary | ICD-10-CM | POA: Diagnosis not present

## 2021-03-04 DIAGNOSIS — J449 Chronic obstructive pulmonary disease, unspecified: Secondary | ICD-10-CM | POA: Diagnosis not present

## 2021-03-04 DIAGNOSIS — I10 Essential (primary) hypertension: Secondary | ICD-10-CM | POA: Diagnosis not present

## 2021-03-28 DIAGNOSIS — K6389 Other specified diseases of intestine: Secondary | ICD-10-CM | POA: Diagnosis not present

## 2021-03-28 DIAGNOSIS — Z1211 Encounter for screening for malignant neoplasm of colon: Secondary | ICD-10-CM | POA: Diagnosis not present

## 2021-03-28 DIAGNOSIS — K644 Residual hemorrhoidal skin tags: Secondary | ICD-10-CM | POA: Diagnosis not present

## 2021-03-28 DIAGNOSIS — K648 Other hemorrhoids: Secondary | ICD-10-CM | POA: Diagnosis not present

## 2021-04-03 DIAGNOSIS — Z1211 Encounter for screening for malignant neoplasm of colon: Secondary | ICD-10-CM | POA: Diagnosis not present

## 2021-06-03 DIAGNOSIS — Z0000 Encounter for general adult medical examination without abnormal findings: Secondary | ICD-10-CM | POA: Diagnosis not present

## 2021-06-03 DIAGNOSIS — I10 Essential (primary) hypertension: Secondary | ICD-10-CM | POA: Diagnosis not present

## 2021-06-03 DIAGNOSIS — J449 Chronic obstructive pulmonary disease, unspecified: Secondary | ICD-10-CM | POA: Diagnosis not present

## 2021-06-03 DIAGNOSIS — I700 Atherosclerosis of aorta: Secondary | ICD-10-CM | POA: Diagnosis not present

## 2021-06-03 DIAGNOSIS — Z6823 Body mass index (BMI) 23.0-23.9, adult: Secondary | ICD-10-CM | POA: Diagnosis not present

## 2021-06-03 DIAGNOSIS — E1121 Type 2 diabetes mellitus with diabetic nephropathy: Secondary | ICD-10-CM | POA: Diagnosis not present

## 2021-06-03 DIAGNOSIS — F419 Anxiety disorder, unspecified: Secondary | ICD-10-CM | POA: Diagnosis not present

## 2021-07-04 DIAGNOSIS — Z1231 Encounter for screening mammogram for malignant neoplasm of breast: Secondary | ICD-10-CM | POA: Diagnosis not present

## 2021-07-10 DIAGNOSIS — Z6822 Body mass index (BMI) 22.0-22.9, adult: Secondary | ICD-10-CM | POA: Diagnosis not present

## 2021-07-10 DIAGNOSIS — L03311 Cellulitis of abdominal wall: Secondary | ICD-10-CM | POA: Diagnosis not present

## 2021-09-02 DIAGNOSIS — E1121 Type 2 diabetes mellitus with diabetic nephropathy: Secondary | ICD-10-CM | POA: Diagnosis not present

## 2021-09-02 DIAGNOSIS — G40911 Epilepsy, unspecified, intractable, with status epilepticus: Secondary | ICD-10-CM | POA: Diagnosis not present

## 2021-09-02 DIAGNOSIS — F419 Anxiety disorder, unspecified: Secondary | ICD-10-CM | POA: Diagnosis not present

## 2021-09-02 DIAGNOSIS — I700 Atherosclerosis of aorta: Secondary | ICD-10-CM | POA: Diagnosis not present

## 2021-09-02 DIAGNOSIS — F339 Major depressive disorder, recurrent, unspecified: Secondary | ICD-10-CM | POA: Diagnosis not present

## 2021-09-02 DIAGNOSIS — Z0000 Encounter for general adult medical examination without abnormal findings: Secondary | ICD-10-CM | POA: Diagnosis not present

## 2021-09-02 DIAGNOSIS — J449 Chronic obstructive pulmonary disease, unspecified: Secondary | ICD-10-CM | POA: Diagnosis not present

## 2021-09-02 DIAGNOSIS — Z6822 Body mass index (BMI) 22.0-22.9, adult: Secondary | ICD-10-CM | POA: Diagnosis not present

## 2021-09-02 DIAGNOSIS — I10 Essential (primary) hypertension: Secondary | ICD-10-CM | POA: Diagnosis not present

## 2021-09-02 DIAGNOSIS — L03311 Cellulitis of abdominal wall: Secondary | ICD-10-CM | POA: Diagnosis not present

## 2021-11-28 ENCOUNTER — Emergency Department (HOSPITAL_COMMUNITY): Payer: Medicare HMO

## 2021-11-28 ENCOUNTER — Encounter (HOSPITAL_COMMUNITY): Payer: Self-pay | Admitting: Emergency Medicine

## 2021-11-28 ENCOUNTER — Other Ambulatory Visit: Payer: Self-pay

## 2021-11-28 ENCOUNTER — Inpatient Hospital Stay (HOSPITAL_COMMUNITY)
Admission: EM | Admit: 2021-11-28 | Discharge: 2021-11-30 | DRG: 682 | Disposition: A | Discharge status: Home / Self Care | Payer: Medicare HMO | Attending: Family Medicine | Admitting: Family Medicine

## 2021-11-28 DIAGNOSIS — R000 Tachycardia, unspecified: Secondary | ICD-10-CM | POA: Diagnosis not present

## 2021-11-28 DIAGNOSIS — K219 Gastro-esophageal reflux disease without esophagitis: Secondary | ICD-10-CM | POA: Diagnosis present

## 2021-11-28 DIAGNOSIS — Z79899 Other long term (current) drug therapy: Secondary | ICD-10-CM | POA: Diagnosis not present

## 2021-11-28 DIAGNOSIS — E119 Type 2 diabetes mellitus without complications: Secondary | ICD-10-CM

## 2021-11-28 DIAGNOSIS — A09 Infectious gastroenteritis and colitis, unspecified: Secondary | ICD-10-CM | POA: Diagnosis not present

## 2021-11-28 DIAGNOSIS — Z9049 Acquired absence of other specified parts of digestive tract: Secondary | ICD-10-CM | POA: Diagnosis not present

## 2021-11-28 DIAGNOSIS — E876 Hypokalemia: Secondary | ICD-10-CM | POA: Diagnosis present

## 2021-11-28 DIAGNOSIS — Z90710 Acquired absence of both cervix and uterus: Secondary | ICD-10-CM

## 2021-11-28 DIAGNOSIS — N179 Acute kidney failure, unspecified: Secondary | ICD-10-CM | POA: Diagnosis not present

## 2021-11-28 DIAGNOSIS — Z681 Body mass index (BMI) 19 or less, adult: Secondary | ICD-10-CM | POA: Diagnosis not present

## 2021-11-28 DIAGNOSIS — F32A Depression, unspecified: Secondary | ICD-10-CM | POA: Diagnosis not present

## 2021-11-28 DIAGNOSIS — Z7982 Long term (current) use of aspirin: Secondary | ICD-10-CM | POA: Diagnosis not present

## 2021-11-28 DIAGNOSIS — R109 Unspecified abdominal pain: Secondary | ICD-10-CM | POA: Diagnosis not present

## 2021-11-28 DIAGNOSIS — Z8673 Personal history of transient ischemic attack (TIA), and cerebral infarction without residual deficits: Secondary | ICD-10-CM | POA: Diagnosis not present

## 2021-11-28 DIAGNOSIS — E860 Dehydration: Secondary | ICD-10-CM | POA: Diagnosis present

## 2021-11-28 DIAGNOSIS — E1165 Type 2 diabetes mellitus with hyperglycemia: Secondary | ICD-10-CM | POA: Diagnosis present

## 2021-11-28 DIAGNOSIS — Z881 Allergy status to other antibiotic agents status: Secondary | ICD-10-CM

## 2021-11-28 DIAGNOSIS — G40909 Epilepsy, unspecified, not intractable, without status epilepticus: Secondary | ICD-10-CM | POA: Diagnosis not present

## 2021-11-28 DIAGNOSIS — E871 Hypo-osmolality and hyponatremia: Secondary | ICD-10-CM | POA: Diagnosis present

## 2021-11-28 DIAGNOSIS — Z794 Long term (current) use of insulin: Secondary | ICD-10-CM | POA: Diagnosis not present

## 2021-11-28 DIAGNOSIS — Z8249 Family history of ischemic heart disease and other diseases of the circulatory system: Secondary | ICD-10-CM

## 2021-11-28 DIAGNOSIS — J449 Chronic obstructive pulmonary disease, unspecified: Secondary | ICD-10-CM | POA: Diagnosis not present

## 2021-11-28 DIAGNOSIS — J438 Other emphysema: Secondary | ICD-10-CM | POA: Diagnosis not present

## 2021-11-28 DIAGNOSIS — Z888 Allergy status to other drugs, medicaments and biological substances status: Secondary | ICD-10-CM | POA: Diagnosis not present

## 2021-11-28 DIAGNOSIS — E1042 Type 1 diabetes mellitus with diabetic polyneuropathy: Secondary | ICD-10-CM | POA: Diagnosis not present

## 2021-11-28 DIAGNOSIS — E43 Unspecified severe protein-calorie malnutrition: Secondary | ICD-10-CM | POA: Diagnosis present

## 2021-11-28 DIAGNOSIS — Z833 Family history of diabetes mellitus: Secondary | ICD-10-CM

## 2021-11-28 DIAGNOSIS — R569 Unspecified convulsions: Secondary | ICD-10-CM

## 2021-11-28 DIAGNOSIS — Z905 Acquired absence of kidney: Secondary | ICD-10-CM

## 2021-11-28 DIAGNOSIS — I10 Essential (primary) hypertension: Secondary | ICD-10-CM | POA: Diagnosis present

## 2021-11-28 DIAGNOSIS — J441 Chronic obstructive pulmonary disease with (acute) exacerbation: Secondary | ICD-10-CM | POA: Diagnosis present

## 2021-11-28 DIAGNOSIS — F17210 Nicotine dependence, cigarettes, uncomplicated: Secondary | ICD-10-CM | POA: Diagnosis present

## 2021-11-28 DIAGNOSIS — Z716 Tobacco abuse counseling: Secondary | ICD-10-CM

## 2021-11-28 DIAGNOSIS — F419 Anxiety disorder, unspecified: Secondary | ICD-10-CM | POA: Diagnosis present

## 2021-11-28 DIAGNOSIS — E1142 Type 2 diabetes mellitus with diabetic polyneuropathy: Secondary | ICD-10-CM | POA: Diagnosis not present

## 2021-11-28 DIAGNOSIS — J439 Emphysema, unspecified: Secondary | ICD-10-CM | POA: Diagnosis not present

## 2021-11-28 LAB — COMPREHENSIVE METABOLIC PANEL
ALT: 18 U/L (ref 0–44)
AST: 18 U/L (ref 15–41)
Albumin: 3.8 g/dL (ref 3.5–5.0)
Alkaline Phosphatase: 34 U/L — ABNORMAL LOW (ref 38–126)
Anion gap: 12 (ref 5–15)
BUN: 40 mg/dL — ABNORMAL HIGH (ref 8–23)
CO2: 21 mmol/L — ABNORMAL LOW (ref 22–32)
Calcium: 9.5 mg/dL (ref 8.9–10.3)
Chloride: 98 mmol/L (ref 98–111)
Creatinine, Ser: 1.87 mg/dL — ABNORMAL HIGH (ref 0.44–1.00)
GFR, Estimated: 29 mL/min — ABNORMAL LOW (ref >60)
Glucose, Bld: 372 mg/dL — ABNORMAL HIGH (ref 70–99)
Potassium: 3.6 mmol/L (ref 3.5–5.1)
Sodium: 131 mmol/L — ABNORMAL LOW (ref 135–145)
Total Bilirubin: 0.9 mg/dL (ref 0.3–1.2)
Total Protein: 8.2 g/dL — ABNORMAL HIGH (ref 6.5–8.1)

## 2021-11-28 LAB — CBC
HCT: 44.3 % (ref 36.0–46.0)
Hemoglobin: 15.2 g/dL — ABNORMAL HIGH (ref 12.0–15.0)
MCH: 31 pg (ref 26.0–34.0)
MCHC: 34.3 g/dL (ref 30.0–36.0)
MCV: 90.2 fL (ref 80.0–100.0)
Platelets: 456 10*3/uL — ABNORMAL HIGH (ref 150–400)
RBC: 4.91 MIL/uL (ref 3.87–5.11)
RDW: 12.5 % (ref 11.5–15.5)
WBC: 14.3 10*3/uL — ABNORMAL HIGH (ref 4.0–10.5)
nRBC: 0 % (ref 0.0–0.2)

## 2021-11-28 LAB — URINALYSIS, ROUTINE W REFLEX MICROSCOPIC
Bilirubin Urine: NEGATIVE
Glucose, UA: 50 mg/dL — AB
Ketones, ur: 5 mg/dL — AB
Nitrite: POSITIVE — AB
Protein, ur: 30 mg/dL — AB
Specific Gravity, Urine: 1.017 (ref 1.005–1.030)
WBC, UA: >50 WBC/hpf — ABNORMAL HIGH (ref 0–5)
pH: 5 (ref 5.0–8.0)

## 2021-11-28 LAB — CREATININE, SERUM
Creatinine, Ser: 1.33 mg/dL — ABNORMAL HIGH (ref 0.44–1.00)
GFR, Estimated: 44 mL/min — ABNORMAL LOW (ref >60)

## 2021-11-28 LAB — LIPASE, BLOOD: Lipase: 92 U/L — ABNORMAL HIGH (ref 11–51)

## 2021-11-28 LAB — HEMOGLOBIN A1C
Hgb A1c MFr Bld: 8.5 % — ABNORMAL HIGH (ref 4.8–5.6)
Mean Plasma Glucose: 197.25 mg/dL

## 2021-11-28 LAB — GLUCOSE, CAPILLARY
Glucose-Capillary: 278 mg/dL — ABNORMAL HIGH (ref 70–99)
Glucose-Capillary: 150 mg/dL — ABNORMAL HIGH (ref 70–99)

## 2021-11-28 LAB — MAGNESIUM: Magnesium: 1.7 mg/dL (ref 1.7–2.4)

## 2021-11-28 LAB — PHOSPHORUS: Phosphorus: 4.4 mg/dL (ref 2.5–4.6)

## 2021-11-28 MED ORDER — INSULIN GLARGINE-YFGN 100 UNIT/ML ~~LOC~~ SOLN
28.0000 [IU] | Freq: Every day | SUBCUTANEOUS | Status: DC
  Administered 2021-11-29: 28 [IU] via SUBCUTANEOUS
  Filled 2021-11-28 (×2): qty 0.28

## 2021-11-28 MED ORDER — POTASSIUM CHLORIDE CRYS ER 10 MEQ PO TBCR
10.0000 meq | EXTENDED_RELEASE_TABLET | Freq: Every day | ORAL | Status: DC
  Administered 2021-11-28 – 2021-11-29 (×2): 10 meq via ORAL
  Filled 2021-11-28 (×7): qty 1

## 2021-11-28 MED ORDER — ACETAMINOPHEN 650 MG RE SUPP: 650.0000 mg | Freq: Four times a day (QID) | RECTAL | Status: DC | PRN

## 2021-11-28 MED ORDER — IPRATROPIUM-ALBUTEROL 0.5-2.5 (3) MG/3ML IN SOLN: 3.0000 mL | Freq: Four times a day (QID) | RESPIRATORY_TRACT | Status: DC | PRN

## 2021-11-28 MED ORDER — ALPRAZOLAM 0.5 MG PO TABS
0.5000 mg | ORAL_TABLET | Freq: Three times a day (TID) | ORAL | Status: DC | PRN
  Administered 2021-11-28 – 2021-11-29 (×2): 0.5 mg via ORAL
  Filled 2021-11-28 (×2): qty 1

## 2021-11-28 MED ORDER — INSULIN ASPART 100 UNIT/ML IJ SOLN
0.0000 [IU] | Freq: Three times a day (TID) | INTRAMUSCULAR | Status: DC
  Administered 2021-11-28: 8 [IU] via SUBCUTANEOUS
  Administered 2021-11-29: 5 [IU] via SUBCUTANEOUS
  Administered 2021-11-30: 2 [IU] via SUBCUTANEOUS

## 2021-11-28 MED ORDER — LEVETIRACETAM 500 MG PO TABS
1000.0000 mg | ORAL_TABLET | Freq: Two times a day (BID) | ORAL | Status: DC
  Administered 2021-11-28 – 2021-11-30 (×4): 1000 mg via ORAL
  Filled 2021-11-28 (×5): qty 2

## 2021-11-28 MED ORDER — ASPIRIN 81 MG PO CHEW
81.0000 mg | CHEWABLE_TABLET | Freq: Every day | ORAL | Status: DC
  Administered 2021-11-29 – 2021-11-30 (×2): 81 mg via ORAL
  Filled 2021-11-28 (×2): qty 1

## 2021-11-28 MED ORDER — NICOTINE 21 MG/24HR TD PT24
21.0000 mg | MEDICATED_PATCH | Freq: Every day | TRANSDERMAL | Status: DC
  Administered 2021-11-28 – 2021-11-30 (×3): 21 mg via TRANSDERMAL
  Filled 2021-11-28 (×3): qty 1

## 2021-11-28 MED ORDER — MORPHINE SULFATE (PF) 2 MG/ML IV SOLN
1.0000 mg | INTRAVENOUS | Status: DC | PRN
  Administered 2021-11-29 – 2021-11-30 (×4): 1 mg via INTRAVENOUS
  Filled 2021-11-28 (×4): qty 1

## 2021-11-28 MED ORDER — SODIUM CHLORIDE 0.9 % IV BOLUS
1000.0000 mL | Freq: Once | INTRAVENOUS | Status: AC
  Administered 2021-11-28: 1000 mL via INTRAVENOUS

## 2021-11-28 MED ORDER — INSULIN ASPART 100 UNIT/ML IJ SOLN
0.0000 [IU] | Freq: Every day | INTRAMUSCULAR | Status: DC
  Administered 2021-11-29: 2 [IU] via SUBCUTANEOUS

## 2021-11-28 MED ORDER — GABAPENTIN 100 MG PO CAPS
200.0000 mg | ORAL_CAPSULE | Freq: Every day | ORAL | Status: DC
  Administered 2021-11-28 – 2021-11-29 (×2): 200 mg via ORAL
  Filled 2021-11-28 (×2): qty 2

## 2021-11-28 MED ORDER — ESCITALOPRAM OXALATE 10 MG PO TABS
20.0000 mg | ORAL_TABLET | Freq: Every day | ORAL | Status: DC
  Administered 2021-11-29 – 2021-11-30 (×2): 20 mg via ORAL
  Filled 2021-11-28 (×2): qty 2

## 2021-11-28 MED ORDER — ACETAMINOPHEN 325 MG PO TABS: 650.0000 mg | ORAL_TABLET | Freq: Four times a day (QID) | ORAL | Status: DC | PRN

## 2021-11-28 MED ORDER — DIVALPROEX SODIUM 250 MG PO DR TAB: 250.0000 mg | DELAYED_RELEASE_TABLET | Freq: Two times a day (BID) | ORAL | Status: DC

## 2021-11-28 MED ORDER — ONDANSETRON HCL 4 MG/2ML IJ SOLN: 4.0000 mg | Freq: Four times a day (QID) | INTRAMUSCULAR | Status: DC | PRN

## 2021-11-28 MED ORDER — MORPHINE SULFATE (PF) 4 MG/ML IV SOLN
4.0000 mg | Freq: Once | INTRAVENOUS | Status: AC
  Administered 2021-11-28: 4 mg via INTRAVENOUS
  Filled 2021-11-28: qty 1

## 2021-11-28 MED ORDER — ACETAMINOPHEN 325 MG PO TABS: 325.0000 mg | ORAL_TABLET | Freq: Four times a day (QID) | ORAL | Status: DC | PRN

## 2021-11-28 MED ORDER — DIVALPROEX SODIUM 250 MG PO DR TAB
250.0000 mg | DELAYED_RELEASE_TABLET | Freq: Two times a day (BID) | ORAL | Status: DC
  Administered 2021-11-28 – 2021-11-30 (×4): 250 mg via ORAL
  Filled 2021-11-28 (×4): qty 1

## 2021-11-28 MED ORDER — LACTATED RINGERS IV SOLN: INTRAVENOUS | Status: DC

## 2021-11-28 MED ORDER — HEPARIN SODIUM (PORCINE) 5000 UNIT/ML IJ SOLN
5000.0000 [IU] | Freq: Three times a day (TID) | INTRAMUSCULAR | Status: DC
  Administered 2021-11-28 – 2021-11-30 (×6): 5000 [IU] via SUBCUTANEOUS
  Filled 2021-11-28 (×6): qty 1

## 2021-11-28 MED ORDER — ONDANSETRON HCL 4 MG PO TABS: 4.0000 mg | ORAL_TABLET | Freq: Four times a day (QID) | ORAL | Status: DC | PRN

## 2021-11-28 NOTE — ED Notes (Signed)
Bladder scan showed 472m . Pt does not feel as if she needs to urinate.

## 2021-11-28 NOTE — Progress Notes (Signed)
There is a history of Depakote allergy in the past, home meds include  Depakote DR 250 mg oral twice daily, as discussed with her husband he confirmed patient has been back on Depakote since April of this year(Vimpat has been switched to Depakote by her PCP), so Depakote will be continued in the hospital, I have recommended for him she is to follow-up with neurology as an outpatient. Leonor Liv MD

## 2021-11-28 NOTE — ED Provider Notes (Signed)
[facility] EMERGENCY DEPARTMENT Provider Note   CSN: [id number] Arrival date & time: 11/28/21  1047     History {Add pertinent medical, surgical, social history, OB history to HPI:1} Chief Complaint  Patient presents with   Abdominal Pain    Veronica Moses is a 66 y.o. female.  Patient states that she has been having some nausea not taking much in by mouth and diarrhea for 3 weeks now.  Patient has a history of diabetes and COPD  The history is provided by the patient and medical records. No language interpreter was used.  Abdominal Pain Pain location:  Generalized Pain quality: aching   Pain radiates to:  Does not radiate Pain severity:  Mild Onset quality:  Sudden Timing:  Constant Progression:  Waxing and waning Chronicity:  New      Home Medications Prior to Admission medications   Medication Sig Start Date End Date Taking? Authorizing Provider  acetaminophen (TYLENOL) 325 MG tablet Take 325 mg by mouth every 6 (six) hours as needed for mild pain or moderate pain.   Yes [provider]  ALPRAZolam Duanne Moron) 0.5 MG tablet Take 0.5 mg by mouth 3 (three) times daily as needed. 02/17/20  Yes [provider]  aspirin 81 MG chewable tablet Chew 81 mg by mouth daily.   Yes [provider]  divalproex (DEPAKOTE) 250 MG DR tablet Take 250 mg by mouth 2 (two) times daily. 11/21/21  Yes [provider]  escitalopram (LEXAPRO) 20 MG tablet Take 20 mg by mouth daily.   Yes [provider]  gabapentin (NEURONTIN) 100 MG capsule Take 200 mg by mouth at bedtime.   Yes [provider]  hydrochlorothiazide (MICROZIDE) 12.5 MG capsule Take 12.5 mg by mouth daily.   Yes [provider]  ibuprofen (ADVIL,MOTRIN) 200 MG tablet Take 200 mg by mouth every 6 (six) hours as needed for headache.   Yes [provider]  Insulin Glargine (LANTUS) 100 UNIT/ML Solostar Pen Inject 28 Units into the skin daily. 06/02/18  Yes [provider]  levETIRAcetam (KEPPRA) 1000 MG tablet Take 1,000 mg by mouth 2 (two) times daily. 02/29/20  Yes [provider]  losartan (COZAAR) 50 MG tablet Take 50 mg by mouth daily.   Yes [provider]  NOVOLOG FLEXPEN 100 UNIT/ML FlexPen Please use the hospital sliding scale provided for you Patient taking differently: Please use the hospital sliding scale provided for you take with each meal 08/25/15  Yes Sinda Du, MD  potassium chloride (KLOR-CON) 10 MEQ tablet Take 10 mEq by mouth daily. 09/19/21  Yes [provider]  rosuvastatin (CRESTOR) 20 MG tablet Take 20 mg by mouth daily. 09/19/21  Yes [provider]      Allergies    Demerol, Depakote [divalproex sodium], Dilaudid [hydromorphone hcl], Doxycycline, and Valproic acid    Review of Systems   Review of Systems  Gastrointestinal:  Positive for abdominal pain.    Physical Exam Updated Vital Signs BP (!) 108/45   Pulse 87   Temp 98.7 F (37.1 C) (Tympanic)   Resp 16   Ht '5\' 1"'$  (1.549 m)   Wt 47.6 kg   SpO2 96%   BMI 19.84 kg/m  Physical Exam  ED Results / Procedures / Treatments   Labs (all labs ordered are listed, but only abnormal results are displayed) Labs Reviewed  LIPASE, BLOOD - Abnormal; Notable for the following components:      Result Value   Lipase 92 (*)  All other components within normal limits  COMPREHENSIVE METABOLIC PANEL - Abnormal; Notable for the following components:   Sodium 131 (*)    CO2 21 (*)    Glucose, Bld 372 (*)    BUN 40 (*)    Creatinine, Ser 1.87 (*)    Total Protein 8.2 (*)    Alkaline Phosphatase 34 (*)    GFR, Estimated 29 (*)    All other components within normal limits  CBC - Abnormal; Notable for the following components:   WBC 14.3 (*)    Hemoglobin 15.2 (*)    Platelets 456 (*)    All other components within normal limits  URINALYSIS, ROUTINE W REFLEX MICROSCOPIC    EKG None  Radiology CT ABDOMEN PELVIS WO  CONTRAST  Result Date: 11/28/2021 CLINICAL DATA:  Abdominal pain, acute, nonlocalized EXAM: CT ABDOMEN AND PELVIS WITHOUT CONTRAST TECHNIQUE: Multidetector CT imaging of the abdomen and pelvis was performed following the standard protocol without IV contrast. RADIATION DOSE REDUCTION: This exam was performed according to the departmental dose-optimization program which includes automated exposure control, adjustment of the mA and/or kV according to patient size and/or use of iterative reconstruction technique. COMPARISON:  January 2021 FINDINGS: Lower chest: No acute abnormality. Hepatobiliary: No focal liver abnormality is seen. Status post cholecystectomy. No biliary dilatation. Pancreas: Atrophy.  Otherwise unremarkable. Spleen: Normal in size without focal abnormality. Adrenals/Urinary Tract: Adrenals are unremarkable. Left nephrectomy. Compensatory right renal hypertrophy. Bladder is unremarkable. Stomach/Bowel: Stomach is within normal limits. Bowel is normal in caliber. Limited evaluation for wall thickening due to poor distension. Vascular/Lymphatic: Atherosclerosis.  No enlarged nodes. Reproductive: Status post hysterectomy. No adnexal masses. Other: No free fluid.  Abdominal wall is unremarkable. Musculoskeletal: No acute osseous abnormality. IMPRESSION: No acute abnormality. Electronically Signed   By: Macy Mis M.D.   On: 11/28/2021 14:28    Procedures Procedures  {Document cardiac monitor, telemetry assessment procedure when appropriate:1}  Medications Ordered in ED Medications  sodium chloride 0.9 % bolus 1,000 mL (0 mLs Intravenous Stopped 11/28/21 1432)  morphine (PF) 4 MG/ML injection 4 mg (4 mg Intravenous Given 11/28/21 1222)    ED Course/ Medical Decision Making/ A&P                           Medical Decision Making Amount and/or Complexity of Data Reviewed Labs: ordered. Radiology: ordered.  Risk Prescription drug management. Decision regarding  hospitalization.   Patient with AKI and persistent nausea and diarrhea with elevated lipase.  Patient will be admitted to medicine  {Document critical care time when appropriate:1} {Document review of labs and clinical decision tools ie heart score, Chads2Vasc2 etc:1}  {Document your independent review of radiology images, and any outside records:1} {Document your discussion with family members, caretakers, and with consultants:1} {Document social determinants of health affecting pt's care:1} {Document your decision making why or why not admission, treatments were needed:1} Final Clinical Impression(s) / ED Diagnoses Final diagnoses:  Dehydration    Rx / DC Orders ED Discharge Orders     None

## 2021-11-28 NOTE — ED Triage Notes (Signed)
Pt presents with abdominal pain, with nausea and diarrhea and decreased appetite x 3 weeks.

## 2021-11-28 NOTE — ED Notes (Signed)
Pt requested to use the bathroom. Pt ambulated to bathroom but was unable to void. Nurse asked pt if she felt like she needed to urinate and pt said yes. Bladder scan done and 0 urine noted.

## 2021-11-28 NOTE — Plan of Care (Signed)

## 2021-11-28 NOTE — H&P (Addendum)
[facility] H&P   Patient Demographics:    Veronica Moses, is a 66 y.o. female  MRN: [id number]   DOB - 1955-12-17  Admit Date - 11/28/2021  Outpatient Primary MD for the patient is Neale Burly, MD  Referring MD/NP/PA: Dr Roderic Palau  Patient coming from: Home  Chief Complaint  Patient presents with   Abdominal Pain      HPI:    Veronica Moses  is a 66 y.o. female, with past medical history of COPD, diabetes mellitus, seizure disorder, tobacco abuse, she presents to ED secondary to nausea, vomiting and abdominal pain, reports has been having intermittent symptoms for last 3 weeks, diarrhea she described it as watery, intermittent abdominal pain, no relieving or provoking factor, nausea and vomiting, with very poor oral intake to solids and liquids, denies any fever, chills, sick contact, reports she is compliant with her medications, she denies any dysuria or polyuria. -In ED she was afebrile, but her blood pressure initially soft, which did respond to fluid boluses, her work-up significant for sodium of 131, glucose of 372, creatinine of 1.87, lipase mildly elevated at 92, but CT abdomen pelvis with no finding of pancreatitis, she denies any alcohol use, bladder scan in ED showing 0 post residual volume, white blood cell count elevated at 14, hemoglobin elevated at 15, [facility] hospitalist consulted to admit.    Review of systems:     A full 10 point Review of Systems was done, except as stated above, all other Review of Systems were negative.   With Past History of the following :    Past Medical History:  Diagnosis Date   Anxiety    Closed fracture of right distal radius    COPD (chronic obstructive pulmonary disease) (HCC)    Depression    Diabetes mellitus    Insulin Dependent. Adult onset   Dyspnea    with exertion   Heart murmur    Hypertension    Memory changes    since being on  vent June 2017- "coming back some."   Seizures ([place]) 08/2015   last seizure 07-2019   Stroke [facility]) 2016   remote, no deficits   UTI (lower urinary tract infection)       Past Surgical History:  Procedure Laterality Date   ABDOMINAL HYSTERECTOMY     BREAST SURGERY Left    cyst-   CHOLECYSTECTOMY     COLONOSCOPY WITH ESOPHAGOGASTRODUODENOSCOPY (EGD) N/A 03/04/2013   Procedure: COLONOSCOPY WITH ESOPHAGOGASTRODUODENOSCOPY (EGD);  Surgeon: Rogene Houston, MD;  [facility];  Service: Endoscopy;  Laterality: N/A;  1200-moved to [place] notified pt   MULTIPLE EXTRACTIONS WITH ALVEOLOPLASTY N/A 05/30/2016   Procedure: MULTIPLE EXTRACTION WITH ALVEOLOPLASTY;  Surgeon: Diona Browner, DDS;  Location: [place];  Service: Oral Surgery;  Laterality: N/A;   NEPHRECTOMY Left    Donatated to brother   OPEN REDUCTION INTERNAL FIXATION (  ORIF) DISTAL RADIAL FRACTURE Right 03/22/2020   Procedure: OPEN REDUCTION INTERNAL FIXATION (ORIF) DISTAL RADIAL FRACTURE;  Surgeon: Daryll Brod, MD;  Location: [place];  Service: Orthopedics;  Laterality: Right;  AXILLARY BLOCK      Social History:     Social History   Tobacco Use   Smoking status: Every Day    Packs/day: 1.00    Years: 26.00    Total pack years: 26.00    Types: Cigarettes    Last attempt to quit: 08/27/2015    Years since quitting: 6.2   Smokeless tobacco: Never   Tobacco comments:    smoked this am    Substance Use Topics   Alcohol use: No        Family History :     Family History  Problem Relation Age of Onset   Diabetes Father    CAD Other        family history   Diabetes Brother    Colon cancer Neg Hx       Home Medications:   Prior to Admission medications   Medication Sig Start Date End Date Taking? Authorizing Provider  acetaminophen (TYLENOL) 325 MG tablet Take 325 mg by mouth every 6 (six) hours as needed for mild pain or moderate pain.   Yes [provider]  ALPRAZolam Duanne Moron)  0.5 MG tablet Take 0.5 mg by mouth 3 (three) times daily as needed. 02/17/20  Yes [provider]  aspirin 81 MG chewable tablet Chew 81 mg by mouth daily.   Yes [provider]  divalproex (DEPAKOTE) 250 MG DR tablet Take 250 mg by mouth 2 (two) times daily. 11/21/21  Yes [provider]  escitalopram (LEXAPRO) 20 MG tablet Take 20 mg by mouth daily.   Yes [provider]  gabapentin (NEURONTIN) 100 MG capsule Take 200 mg by mouth at bedtime.   Yes [provider]  hydrochlorothiazide (MICROZIDE) 12.5 MG capsule Take 12.5 mg by mouth daily.   Yes [provider]  ibuprofen (ADVIL,MOTRIN) 200 MG tablet Take 200 mg by mouth every 6 (six) hours as needed for headache.   Yes [provider]  Insulin Glargine (LANTUS) 100 UNIT/ML Solostar Pen Inject 28 Units into the skin daily. 06/02/18  Yes [provider]  levETIRAcetam (KEPPRA) 1000 MG tablet Take 1,000 mg by mouth 2 (two) times daily. 02/29/20  Yes [provider]  losartan (COZAAR) 50 MG tablet Take 50 mg by mouth daily.   Yes [provider]  NOVOLOG FLEXPEN 100 UNIT/ML FlexPen Please use the hospital sliding scale provided for you Patient taking differently: Please use the hospital sliding scale provided for you take with each meal 08/25/15  Yes Sinda Du, MD  potassium chloride (KLOR-CON) 10 MEQ tablet Take 10 mEq by mouth daily. 09/19/21  Yes [provider]  rosuvastatin (CRESTOR) 20 MG tablet Take 20 mg by mouth daily. 09/19/21  Yes [provider]     Allergies:     Allergies  Allergen Reactions   Demerol Other (See Comments)    hallucinations   Depakote [Divalproex Sodium] Other (See Comments)    Sore/blisters   Dilaudid [Hydromorphone Hcl] Nausea And Vomiting   Doxycycline Other (See Comments)    "Blistered"   Valproic Acid Rash     Physical Exam:   Vitals  Blood pressure (!) 108/45, pulse 87, temperature 98.7 F  (37.1 C), temperature source Tympanic, resp. rate 16, height '5\' 1"'$  (1.549 m), weight 47.6 kg,  SpO2 96 %.   1. General frail, deconditioned, in NAD  2. Normal affect and insight, Not Suicidal or Homicidal, Awake Alert, Oriented X 3.  3. No F.N deficits, ALL C.Nerves Intact, Strength 5/5 all 4 extremities, Sensation intact all 4 extremities, Plantars down going.  4. Ears and Eyes appear Normal, Conjunctivae clear, PERRLA. Dry Oral Mucosa.  5. Supple Neck, No JVD, No cervical lymphadenopathy appriciated, No Carotid Bruits.  6. Symmetrical Chest wall movement, Good air movement bilaterally, CTAB.  7. Tachycardia , No Gallops, Rubs or Murmurs, No Parasternal Heave.  8. Positive Bowel Sounds, Abdomen Soft, No tenderness, No organomegaly appriciated,No rebound -guarding or rigidity.  9.  No Cyanosis, slow  Skin Turgor, No Skin Rash or Bruise.  10. Good muscle tone,  joints appear normal , no effusions, Normal ROM.  11. No Palpable Lymph Nodes in Neck or Axillae     Data Review:    CBC Recent Labs  Lab 11/28/21 1123  WBC 14.3*  HGB 15.2*  HCT 44.3  PLT 456*  MCV 90.2  MCH 31.0  MCHC 34.3  RDW 12.5   ------------------------------------------------------------------------------------------------------------------  Chemistries  Recent Labs  Lab 11/28/21 1123  NA 131*  K 3.6  CL 98  CO2 21*  GLUCOSE 372*  BUN 40*  CREATININE 1.87*  CALCIUM 9.5  AST 18  ALT 18  ALKPHOS 34*  BILITOT 0.9   ------------------------------------------------------------------------------------------------------------------ estimated creatinine clearance is 21.9 mL/min (A) (by C-G formula based on SCr of 1.87 mg/dL (H)). ------------------------------------------------------------------------------------------------------------------ No results for input(s): "TSH", "T4TOTAL", "T3FREE", "THYROIDAB" in the last 72 hours.  Invalid input(s): "FREET3"  Coagulation profile No results for  input(s): "INR", "PROTIME" in the last 168 hours. ------------------------------------------------------------------------------------------------------------------- No results for input(s): "DDIMER" in the last 72 hours. -------------------------------------------------------------------------------------------------------------------  Cardiac Enzymes No results for input(s): "CKMB", "TROPONINI", "MYOGLOBIN" in the last 168 hours.  Invalid input(s): "CK" ------------------------------------------------------------------------------------------------------------------ No results found for: "BNP"   ---------------------------------------------------------------------------------------------------------------  Urinalysis    Component Value Date/Time   COLORURINE YELLOW 04/13/2019 2123   APPEARANCEUR CLEAR 04/13/2019 2123   LABSPEC 1.030 04/13/2019 2123   PHURINE 6.0 04/13/2019 2123   GLUCOSEU NEGATIVE 04/13/2019 2123   HGBUR NEGATIVE 04/13/2019 2123   BILIRUBINUR NEGATIVE 04/13/2019 2123   KETONESUR NEGATIVE 04/13/2019 2123   PROTEINUR NEGATIVE 04/13/2019 2123   UROBILINOGEN 0.2 07/13/2014 1507   NITRITE POSITIVE (A) 04/13/2019 2123   LEUKOCYTESUR TRACE (A) 04/13/2019 2123    ----------------------------------------------------------------------------------------------------------------   Imaging Results:    CT ABDOMEN PELVIS WO CONTRAST  Result Date: 11/28/2021 CLINICAL DATA:  Abdominal pain, acute, nonlocalized EXAM: CT ABDOMEN AND PELVIS WITHOUT CONTRAST TECHNIQUE: Multidetector CT imaging of the abdomen and pelvis was performed following the standard protocol without IV contrast. RADIATION DOSE REDUCTION: This exam was performed according to the departmental dose-optimization program which includes automated exposure control, adjustment of the mA and/or kV according to patient size and/or use of iterative reconstruction technique. COMPARISON:  January 2021 FINDINGS: Lower  chest: No acute abnormality. Hepatobiliary: No focal liver abnormality is seen. Status post cholecystectomy. No biliary dilatation. Pancreas: Atrophy.  Otherwise unremarkable. Spleen: Normal in size without focal abnormality. Adrenals/Urinary Tract: Adrenals are unremarkable. Left nephrectomy. Compensatory right renal hypertrophy. Bladder is unremarkable. Stomach/Bowel: Stomach is within normal limits. Bowel is normal in caliber. Limited evaluation for wall thickening due to poor distension. Vascular/Lymphatic: Atherosclerosis.  No enlarged nodes. Reproductive: Status post hysterectomy. No adnexal masses. Other: No free fluid.  Abdominal wall is unremarkable. Musculoskeletal: No acute osseous abnormality. IMPRESSION: No acute abnormality.  Electronically Signed   By: Macy Mis M.D.   On: 11/28/2021 14:28    My personal review of EKG: Rhythm sinus tachycardia , Rate  110 /min, QTc 481, no Acute ST changes   Assessment & Plan:    Principal Problem:   AKI (acute kidney injury) ([place]) Active Problems:   Diabetes (HCC)   COPD (chronic obstructive pulmonary disease) (HCC)   GERD (gastroesophageal reflux disease)   Protein-calorie malnutrition, severe (HCC)   Seizure (HCC)  AKI -Volume depletion and dehydration,  - nausea, vomiting and diarrhea with poor oral intake.  No evidence of urinary retention on bladder scan -Continue with IV fluids -Losartan and hydrochlorothiazide  Nausea/vomiting/diarrhea -Most likely due to infectious etiology, she reports watery diarrhea for couple weeks, will check C. difficile, GI panel. -No colitis or ileitis on imaging, so no need for antibiotics -Keep on as needed nausea medications -Continue with IV fluids  Elevated lipase -Likely due to dehydration, no clinical evidence of pancreatitis, she denies any alcohol use, will recheck level after appropriate hydration  Hyponatremia -Due to volume depletion, and hyperglycemia, should correct with blood glucose  control and improvement of dehydration  Diabetes mellitus, type II, uncontrolled -Patient reports she is compliant with her insulin. -Check A1c. -Continue with home dose insulin 28 units daily -We will add insulin sliding scale  Seizures -Continue with home medications  HTN -blood pressure has been soft initially, currently responded to IV fluids. -Hold medications till patient is more stable  COPD -No wheezing. - on prn duonebs   Tobacco abuse -she was counseled  Protein calorie malnutrition -Will consult nutritionist  DVT Prophylaxis Heparin   AM Labs Ordered, also please review Full Orders  Family Communication: Admission, patients condition and plan of care including tests being ordered have been discussed with the patient and Daughter  at bedside who indicate understanding and agree with the plan and Code Status.  Code Status Full  Likely DC to  home  Condition GUARDED    Consults called: none    Admission status: inpatient    Time spent in minutes : 75 minutes   Phillips Climes M.D on 11/28/2021 at 3:25 PM   [facility] - Office  [phone]

## 2021-11-29 DIAGNOSIS — J439 Emphysema, unspecified: Secondary | ICD-10-CM

## 2021-11-29 DIAGNOSIS — Z881 Allergy status to other antibiotic agents status: Secondary | ICD-10-CM | POA: Diagnosis not present

## 2021-11-29 DIAGNOSIS — K219 Gastro-esophageal reflux disease without esophagitis: Secondary | ICD-10-CM

## 2021-11-29 DIAGNOSIS — E876 Hypokalemia: Secondary | ICD-10-CM | POA: Diagnosis present

## 2021-11-29 DIAGNOSIS — Z8673 Personal history of transient ischemic attack (TIA), and cerebral infarction without residual deficits: Secondary | ICD-10-CM | POA: Diagnosis not present

## 2021-11-29 DIAGNOSIS — Z7982 Long term (current) use of aspirin: Secondary | ICD-10-CM | POA: Diagnosis not present

## 2021-11-29 DIAGNOSIS — Z888 Allergy status to other drugs, medicaments and biological substances status: Secondary | ICD-10-CM | POA: Diagnosis not present

## 2021-11-29 DIAGNOSIS — J449 Chronic obstructive pulmonary disease, unspecified: Secondary | ICD-10-CM | POA: Diagnosis present

## 2021-11-29 DIAGNOSIS — F17210 Nicotine dependence, cigarettes, uncomplicated: Secondary | ICD-10-CM | POA: Diagnosis present

## 2021-11-29 DIAGNOSIS — E1142 Type 2 diabetes mellitus with diabetic polyneuropathy: Secondary | ICD-10-CM

## 2021-11-29 DIAGNOSIS — Z90710 Acquired absence of both cervix and uterus: Secondary | ICD-10-CM | POA: Diagnosis not present

## 2021-11-29 DIAGNOSIS — E43 Unspecified severe protein-calorie malnutrition: Secondary | ICD-10-CM | POA: Diagnosis present

## 2021-11-29 DIAGNOSIS — Z681 Body mass index (BMI) 19 or less, adult: Secondary | ICD-10-CM | POA: Diagnosis not present

## 2021-11-29 DIAGNOSIS — Z79899 Other long term (current) drug therapy: Secondary | ICD-10-CM | POA: Diagnosis not present

## 2021-11-29 DIAGNOSIS — I10 Essential (primary) hypertension: Secondary | ICD-10-CM | POA: Diagnosis present

## 2021-11-29 DIAGNOSIS — F32A Depression, unspecified: Secondary | ICD-10-CM | POA: Diagnosis present

## 2021-11-29 DIAGNOSIS — R569 Unspecified convulsions: Secondary | ICD-10-CM | POA: Diagnosis not present

## 2021-11-29 DIAGNOSIS — G40909 Epilepsy, unspecified, not intractable, without status epilepticus: Secondary | ICD-10-CM | POA: Diagnosis present

## 2021-11-29 DIAGNOSIS — Z9049 Acquired absence of other specified parts of digestive tract: Secondary | ICD-10-CM | POA: Diagnosis not present

## 2021-11-29 DIAGNOSIS — A09 Infectious gastroenteritis and colitis, unspecified: Secondary | ICD-10-CM | POA: Diagnosis present

## 2021-11-29 DIAGNOSIS — Z794 Long term (current) use of insulin: Secondary | ICD-10-CM | POA: Diagnosis not present

## 2021-11-29 DIAGNOSIS — E860 Dehydration: Secondary | ICD-10-CM | POA: Diagnosis present

## 2021-11-29 DIAGNOSIS — Z8249 Family history of ischemic heart disease and other diseases of the circulatory system: Secondary | ICD-10-CM | POA: Diagnosis not present

## 2021-11-29 DIAGNOSIS — F419 Anxiety disorder, unspecified: Secondary | ICD-10-CM | POA: Diagnosis present

## 2021-11-29 DIAGNOSIS — N179 Acute kidney failure, unspecified: Secondary | ICD-10-CM | POA: Diagnosis present

## 2021-11-29 DIAGNOSIS — E871 Hypo-osmolality and hyponatremia: Secondary | ICD-10-CM | POA: Diagnosis present

## 2021-11-29 DIAGNOSIS — E1165 Type 2 diabetes mellitus with hyperglycemia: Secondary | ICD-10-CM | POA: Diagnosis present

## 2021-11-29 LAB — GASTROINTESTINAL PANEL BY PCR, STOOL (REPLACES STOOL CULTURE)

## 2021-11-29 LAB — GLUCOSE, CAPILLARY
Glucose-Capillary: 81 mg/dL (ref 70–99)
Glucose-Capillary: 203 mg/dL — ABNORMAL HIGH (ref 70–99)
Glucose-Capillary: 52 mg/dL — ABNORMAL LOW (ref 70–99)
Glucose-Capillary: 143 mg/dL — ABNORMAL HIGH (ref 70–99)
Glucose-Capillary: 227 mg/dL — ABNORMAL HIGH (ref 70–99)

## 2021-11-29 LAB — BASIC METABOLIC PANEL
Anion gap: 9 (ref 5–15)
BUN: 24 mg/dL — ABNORMAL HIGH (ref 8–23)
CO2: 22 mmol/L (ref 22–32)
Calcium: 8.5 mg/dL — ABNORMAL LOW (ref 8.9–10.3)
Chloride: 106 mmol/L (ref 98–111)
Creatinine, Ser: 1.07 mg/dL — ABNORMAL HIGH (ref 0.44–1.00)
GFR, Estimated: 57 mL/min — ABNORMAL LOW (ref >60)
Glucose, Bld: 61 mg/dL — ABNORMAL LOW (ref 70–99)
Potassium: 2.7 mmol/L — CL (ref 3.5–5.1)
Sodium: 137 mmol/L (ref 135–145)

## 2021-11-29 LAB — HIV ANTIBODY (ROUTINE TESTING W REFLEX): HIV Screen 4th Generation wRfx: NONREACTIVE

## 2021-11-29 LAB — C DIFFICILE QUICK SCREEN W PCR REFLEX
C Diff antigen: POSITIVE — AB
C Diff toxin: NEGATIVE

## 2021-11-29 LAB — POTASSIUM: Potassium: 3.6 mmol/L (ref 3.5–5.1)

## 2021-11-29 LAB — CLOSTRIDIUM DIFFICILE BY PCR, REFLEXED: Toxigenic C. Difficile by PCR: NEGATIVE

## 2021-11-29 MED ORDER — INSULIN GLARGINE-YFGN 100 UNIT/ML ~~LOC~~ SOLN
10.0000 [IU] | Freq: Every day | SUBCUTANEOUS | Status: DC
  Administered 2021-11-30: 10 [IU] via SUBCUTANEOUS
  Filled 2021-11-29 (×2): qty 0.1

## 2021-11-29 MED ORDER — POTASSIUM CHLORIDE 10 MEQ/100ML IV SOLN
10.0000 meq | INTRAVENOUS | Status: DC
  Administered 2021-11-29 (×3): 10 meq via INTRAVENOUS
  Filled 2021-11-29 (×4): qty 100

## 2021-11-29 MED ORDER — POTASSIUM CHLORIDE CRYS ER 20 MEQ PO TBCR
40.0000 meq | EXTENDED_RELEASE_TABLET | ORAL | Status: AC
  Administered 2021-11-29 (×2): 40 meq via ORAL
  Filled 2021-11-29 (×2): qty 2

## 2021-11-29 MED ORDER — BOOST / RESOURCE BREEZE PO LIQD CUSTOM
1.0000 | Freq: Three times a day (TID) | ORAL | Status: DC
  Administered 2021-11-29: 1 via ORAL

## 2021-11-29 NOTE — Care Management Obs Status (Signed)
MEDICARE OBSERVATION STATUS NOTIFICATION   Patient Details  Name: Veronica Moses MRN: [id number] Date of Birth: 06-13-1955   Medicare Observation Status Notification Given:  Yes    Tommy Medal 11/29/2021, 2:45 PM

## 2021-11-29 NOTE — Progress Notes (Signed)
Latest Reference Range & Units 11/29/21 25:05  BASIC METABOLIC PANEL  Rpt !!  Sodium 135 - 145 mmol/L 137  Potassium 3.5 - 5.1 mmol/L 2.7 (LL)  Chloride 98 - 111 mmol/L 106  CO2 22 - 32 mmol/L 22  Glucose 70 - 99 mg/dL 61 (L)  BUN 8 - 23 mg/dL 24 (H)  Creatinine 0.44 - 1.00 mg/dL 1.07 (H)  Calcium 8.9 - 10.3 mg/dL 8.5 (L)  Anion gap 5 - 15  9  GFR, Estimated >60 mL/min 57 (L)  WBC 4.0 - 10.5 K/uL 12.6 (H)  RBC 3.87 - 5.11 MIL/uL 3.74 (L)  Hemoglobin 12.0 - 15.0 g/dL 11.8 (L)  HCT 36.0 - 46.0 % 34.1 (L)  MCV 80.0 - 100.0 fL 91.2  MCH 26.0 - 34.0 pg 31.6  MCHC 30.0 - 36.0 g/dL 34.6  RDW 11.5 - 15.5 % 12.4  Platelets 150 - 400 K/uL 329  nRBC 0.0 - 0.2 % 0.0  !!: Data is critical (LL): Data is critically low (L): Data is abnormally low (H): Data is abnormally high Rpt: View report in Results Review for more information   Dr. Clearence Ped notified of Critical lab value. Potassium 2.7. New orders in chart.

## 2021-11-29 NOTE — Inpatient Diabetes Management (Addendum)
Inpatient Diabetes Program Recommendations  AACE/ADA: New Consensus Statement on Inpatient Glycemic Control   Target Ranges:  Prepandial:   less than 140 mg/dL      Peak postprandial:   less than 180 mg/dL (1-2 hours)      Critically ill patients:  140 - 180 mg/dL    Latest Reference Range & Units 11/28/21 18:07 11/28/21 20:19 11/29/21 07:34  Glucose-Capillary 70 - 99 mg/dL 278 (H) 150 (H) 81    Latest Reference Range & Units 11/28/21 11:23 11/29/21 03:45  Glucose 70 - 99 mg/dL 372 (H) 61 (L)   Review of Glycemic Control  Diabetes history: DM2 Outpatient Diabetes medications: Lantus 28 units daily, Novolog sliding scale Current orders for Inpatient glycemic control: Semglee 28 units daily, Novolog 0-15 units TID with meals, Novolog 0-5 units QHS  Inpatient Diabetes Program Recommendations:    Insulin: Please consider decreasing Semglee to 10 units daily. If patient is not eating well, may want to change frequency of CBGs and Novolog to 0-9 units Q4H.  NOTE: Spoke with patient over the phone regarding DM control and outpatient DM medications. Patient reports that she usually takes Lantus 28 units QAM and Novolog per sliding scale for DM control. Patient reports that over the past several days she has been having N/V/D and she has not been taking the full dose of Lantus. She states that her husband has been helping her to adjust the dose and she is not sure how much he has been giving her. Questioned if she had any symptoms of hypoglycemia during the night (since lab glucose was 61 mg/dl today at 3:45) and patient states that she did not feel low at any time during the night that she could tell. Discussed current insulin orders and informed patient that it would be requested that Semglee dose be decreased. Patient verbalized understanding and has no questions at this time.  Thanks, Barnie Alderman, RN, MSN, [facility] Coordinator Inpatient Diabetes Program [phone] (Team Pager from 8am  to [place])

## 2021-11-29 NOTE — Progress Notes (Addendum)
PROGRESS NOTE    Veronica Moses  IRC:[id number] DOB: 1956-01-21 DOA: 11/28/2021 PCP: Neale Burly, MD   Brief Narrative: Veronica Moses is a 66 y.o. female with a history of COPD, diabetes mellitus, seizure disorder, tobacco abuse. Patient presented secondary to abdominal pain, nausea and vomiting and was found to have severe hypokalemia and an AKI. She was started on IV fluids and given potassium supplementation for treatment. Patient with persistent high volume diarrhea with continued risk for recurrent severe electrolyte abnormalities.   Assessment and Plan:  AKI Baseline creatinine of 0.8-0.9. Creatinine of 1.87 on admission which has improved to 1.07 on BMP today with IV fluids. -Continue IV fluids while having persistent diarrhea.  Nausea/vomiting/diarrhea Presumed infectious gastroenteritis. C. Difficile testing is positive for antigen but PCR negative. Patient continues to have multiple episodes of watery diarrhea. -Supportive care -Advance to soft diet  Elevated lipase Mild. No associated epigastric pain/tenderness. CT scan significant for atrophied pancreas.  Hyponatremia Mild. 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 Diabetes is uncontrolled with hyperglycemia and now hypoglycemia in setting of poor oral intake. Most recent hemoglobin A1C of 8.5%. Patient is on insulin glargine 28 units daily and insulin aspart sliding scale as an outpatient. -Decrease to insulin glargine 10 units while inpatient and continue SSI  Seizure disorder -Continue Keppra and Depakote  Primary hypertension Patient is managed on losartan and hydrochlorothiazide as an outpatient. Antihypertensives held secondary to dehydration and AKI. Blood pressure currently normotensive.  COPD Stable. No signs of exacerbation. -Continue Duonebs PRN  Tobacco abuse Counseled on admission. -Continue nicotine patch  Severe malnutrition Evaluated by  dietitian. -Dietitian recommendations (9/15): Boost Breeze po TID, each supplement provides 250 kcal and 9 grams of protein.   DVT prophylaxis: Heparin subq Code Status:   Code Status: Full Code Family Communication: None at bedside Disposition Plan: Discharge home possibly in 24-48 hours pending improvement of diarrhea frequency, stable electrolytes and ability to tolerate oral intake.   Consultants:  None  Procedures:  None  Antimicrobials: None    Subjective: Patient reports feeling well today. Hoping to go home. No nausea or vomiting this morning. Diarrhea overnight.  Objective: BP 105/66 (BP Location: Right Arm)   Pulse 86   Temp 98.2 F (36.8 C) (Oral)   Resp 20   Ht '5\' 1"'$  (1.549 m)   Wt 47.6 kg   SpO2 96%   BMI 19.84 kg/m   Examination:  General exam: Appears calm and comfortable Respiratory system: Clear to auscultation. Respiratory effort normal. Cardiovascular system: S1 & S2 heard, RRR. Gastrointestinal system: Abdomen is nondistended, soft and nontender. Normal bowel sounds heard. Central nervous system: Alert and oriented. No focal neurological deficits. Psychiatry: Judgement and insight appear normal. Mood & affect appropriate.    Data Reviewed: I have personally reviewed following labs and imaging studies  CBC Lab Results  Component Value Date   WBC 12.6 (H) 11/29/2021   RBC 3.74 (L) 11/29/2021   HGB 11.8 (L) 11/29/2021   HCT 34.1 (L) 11/29/2021   MCV 91.2 11/29/2021   MCH 31.6 11/29/2021   PLT 329 11/29/2021   MCHC 34.6 11/29/2021   RDW 12.4 11/29/2021   LYMPHSABS 3.0 03/10/2020   MONOABS 0.9 03/10/2020   EOSABS 0.2 03/10/2020   BASOSABS 0.1 51/04/5850     Last metabolic panel Lab Results  Component Value Date   NA 137 11/29/2021   K 3.6 11/29/2021   CL 106 11/29/2021   CO2  22 11/29/2021   BUN 24 (H) 11/29/2021   CREATININE 1.07 (H) 11/29/2021   GLUCOSE 61 (L) 11/29/2021   GFRNONAA 57 (L) 11/29/2021   GFRAA >60 04/13/2019    CALCIUM 8.5 (L) 11/29/2021   PHOS 4.4 11/28/2021   PROT 8.2 (H) 11/28/2021   ALBUMIN 3.8 11/28/2021   BILITOT 0.9 11/28/2021   ALKPHOS 34 (L) 11/28/2021   AST 18 11/28/2021   ALT 18 11/28/2021   ANIONGAP 9 11/29/2021    GFR: Estimated Creatinine Clearance: 38.3 mL/min (A) (by C-G formula based on SCr of 1.07 mg/dL (H)).  Recent Results (from the past 240 hour(s))  C Difficile Quick Screen w PCR reflex     Status: Abnormal   Collection Time: 11/29/21  1:29 AM   Specimen: STOOL  Result Value Ref Range Status   C Diff antigen POSITIVE (A) NEGATIVE Final   C Diff toxin NEGATIVE NEGATIVE Final   C Diff interpretation Results are indeterminate. See PCR results.  Final    Comment: Performed at [facility], [address], [place], [place] [address]  C. Diff by PCR, Reflexed     Status: None   Collection Time: 11/29/21  1:29 AM  Result Value Ref Range Status   Toxigenic C. Difficile by PCR NEGATIVE NEGATIVE Final    Comment: Patient is colonized with non toxigenic C. difficile. May not need treatment unless significant symptoms are present. Performed at [facility], [place] [address], [place], [place] [address]       Radiology Studies: CT ABDOMEN PELVIS WO CONTRAST  Result Date: 11/28/2021 CLINICAL DATA:  Abdominal pain, acute, nonlocalized EXAM: CT ABDOMEN AND PELVIS WITHOUT CONTRAST TECHNIQUE: Multidetector CT imaging of the abdomen and pelvis was performed following the standard protocol without IV contrast. RADIATION DOSE REDUCTION: This exam was performed according to the departmental dose-optimization program which includes automated exposure control, adjustment of the mA and/or kV according to patient size and/or use of iterative reconstruction technique. COMPARISON:  January 2021 FINDINGS: Lower chest: No acute abnormality. Hepatobiliary: No focal liver abnormality is seen. Status post cholecystectomy. No biliary dilatation. Pancreas: Atrophy.  Otherwise unremarkable.  Spleen: Normal in size without focal abnormality. Adrenals/Urinary Tract: Adrenals are unremarkable. Left nephrectomy. Compensatory right renal hypertrophy. Bladder is unremarkable. Stomach/Bowel: Stomach is within normal limits. Bowel is normal in caliber. Limited evaluation for wall thickening due to poor distension. Vascular/Lymphatic: Atherosclerosis.  No enlarged nodes. Reproductive: Status post hysterectomy. No adnexal masses. Other: No free fluid.  Abdominal wall is unremarkable. Musculoskeletal: No acute osseous abnormality. IMPRESSION: No acute abnormality. Electronically Signed   By: Macy Mis M.D.   On: 11/28/2021 14:28      LOS: 0 days    Cordelia Poche, MD [facility] 11/29/2021, 5:56 PM   If 7PM-7AM, please contact night-coverage [email or web address]

## 2021-11-29 NOTE — Progress Notes (Addendum)
Initial Nutrition Assessment  DOCUMENTATION CODES:   Severe malnutrition in context of acute illness/injury  INTERVENTION:   Boost Breeze po TID, each supplement provides 250 kcal and 9 grams of protein.  NUTRITION DIAGNOSIS:   Severe Malnutrition related to acute illness (acute GI illness with N/V/D and poor oral intake) as evidenced by energy intake < or equal to 50% for > or equal to 5 days, percent weight loss (12.5% weight loss within one month).  GOAL:   Patient will meet greater than or equal to 90% of their needs  MONITOR:   PO intake, Supplement acceptance, Diet advancement, Labs  REASON FOR ASSESSMENT:   Consult Assessment of nutrition requirement/status  ASSESSMENT:   66 yo female admitted with AKI. PMH includes COPD, DM (insulin dependent), seizure disorder, tobacco abuse, HTN, stroke.  At home, patient has been experiencing N/V/D for the past 3 weeks. Oral intake has been very poor, only able to tolerate small amounts for the past 2-3 weeks. She reports weight loss, currently 105 lbs, usual weight 120-130 lbs. 12.5% weight loss within the past month is significant, likely partially related to dehydration, however, with ongoing symptoms for 3 weeks, suspect some actual loss of lean body mass. Weight hx reviewed, most recent weight PTA was 64 kg on 03/22/2020. Currently receiving IVF for volume depletion. C. Diff antigen positive, toxin negative; PCR test pending, GI panel pending. CT abdomen & pelvis showed no acute abnormality. Currently on clear liquids. Meal intakes not documented.  Labs reviewed. K 2.7, A1C 8.5 CBG: 278-150-81  Medications reviewed and include Novolog, Semglee, potassium chloride PO and IV. IVF: LR at 100 ml/h.   Patient meets criteria for severe malnutrition in the context of acute illness with oral intake meeting < 50% of estimated energy needs for > 5 days and 12.5% weight loss within one month.   NUTRITION - FOCUSED PHYSICAL EXAM:  Unable  to complete, RD working remotely  Diet Order:   Diet Order             Diet clear liquid Room service appropriate? Yes; Fluid consistency: Thin  Diet effective now                   EDUCATION NEEDS:   No education needs have been identified at this time  Skin:  Skin Assessment: Reviewed RN Assessment  Last BM:  9/14  Height:   Ht Readings from Last 1 Encounters:  11/28/21 '5\' 1"'$  (1.549 m)    Weight:   Wt Readings from Last 1 Encounters:  11/28/21 47.6 kg    Ideal Body Weight:  47.7 kg  BMI:  Body mass index is 19.84 kg/m.  Estimated Nutritional Needs:   Kcal:  1500-1700  Protein:  70-80 gm  Fluid:  >/= 1.5 L   Lucas Mallow RD, LDN, CNSC Please refer to Amion for contact information.

## 2021-11-29 NOTE — Evaluation (Signed)
Occupational Therapy Evaluation Patient Details Name: Veronica Moses MRN: [id number] DOB: 1955-09-05 Today's Date: 11/29/2021   History of Present Illness Veronica Moses  is a 66 y.o. female, with past medical history of COPD, diabetes mellitus, seizure disorder, tobacco abuse, she presents to ED secondary to nausea, vomiting and abdominal pain, reports has been having intermittent symptoms for last 3 weeks, diarrhea she described it as watery, intermittent abdominal pain, no relieving or provoking factor, nausea and vomiting, with very poor oral intake to solids and liquids, denies any fever, chills, sick contact, reports she is compliant with her medications, she denies any dysuria or polyuria.  -In ED she was afebrile, but her blood pressure initially soft, which did respond to fluid boluses, her work-up significant for sodium of 131, glucose of 372, creatinine of 1.87, lipase mildly elevated at 92, but CT abdomen pelvis with no finding of pancreatitis, she denies any alcohol use, bladder scan in ED showing 0 post residual volume, white blood cell count elevated at 14, hemoglobin elevated at 15, [facility] consulted to admit.   Clinical Impression   Pt agreeable to OT evaluation. She reports she lives at home with her husband who is able to provide 24/7 assist if needed. She completes all ADLS independently and reports that her husband completes all driving. During evaluation, she was able to complete bed mobility independently, sit to stand t/f with supervision, toilet transfer with supervision, donned socks independently. Pt does not need continued OT services and is abl;e to be discharged home without OT follow up due to support at home.       Recommendations for follow up therapy are one component of a multi-disciplinary discharge planning process, led by the attending physician.  Recommendations may be updated based on patient status, additional functional criteria and insurance authorization.    Follow Up Recommendations  No OT follow up    Assistance Recommended at Discharge PRN  Patient can return home with the following Assistance with cooking/housework;Assist for transportation    Functional Status Assessment  Patient has had a recent decline in their functional status and demonstrates the ability to make significant improvements in function in a reasonable and predictable amount of time.  Equipment Recommendations  None recommended by OT    Recommendations for Other Services       Precautions / Restrictions Precautions Precautions: Fall Restrictions Weight Bearing Restrictions: No      Mobility Bed Mobility Overal bed mobility: Independent             General bed mobility comments: Went from supine to sitting EOB independently with HOB flat    Transfers Overall transfer level: Independent Equipment used: None               General transfer comment: supervision for safety during transfers, completed sit to stand t/f and toilet t/f      Balance Overall balance assessment: Independent                                         ADL either performed or assessed with clinical judgement   ADL Overall ADL's : Independent                                       General ADL Comments: Pt donned socks independently  Vision Baseline Vision/History: 0 No visual deficits Ability to See in Adequate Light: 0 Adequate Patient Visual Report: No change from baseline Vision Assessment?: No apparent visual deficits                Pertinent Vitals/Pain Pain Assessment Pain Assessment: No/denies pain     Hand Dominance Right   Extremity/Trunk Assessment Upper Extremity Assessment Upper Extremity Assessment: Overall WFL for tasks assessed   Lower Extremity Assessment Lower Extremity Assessment: Defer to PT evaluation   Cervical / Trunk Assessment Cervical / Trunk Assessment: Normal   Communication  Communication Communication: No difficulties   Cognition Arousal/Alertness: Awake/alert Behavior During Therapy: WFL for tasks assessed/performed Overall Cognitive Status: Within Functional Limits for tasks assessed                                                        Home Living Family/patient expects to be discharged to:: Private residence Living Arrangements: Spouse/significant other Available Help at Discharge: Family Type of Home: Mobile home Home Access: Level entry     Home Layout: One level     Bathroom Shower/Tub: Occupational psychologist: Handicapped height Bathroom Accessibility: Yes How Accessible: Accessible via walker Home Equipment: Conservation officer, nature (2 wheels);Cane - single point          Prior Functioning/Environment Prior Level of Function : Independent/Modified Independent             Mobility Comments: Community ambulator ADLs Comments: pt reports she was independent in all ADLS prior to hospitalization, husband provided assist for IALDS (cleaning/cooking/driving)        OT Problem List: Decreased strength      OT Treatment/Interventions:      OT Goals(Current goals can be found in the care plan section) Acute Rehab OT Goals Patient Stated Goal: to return home OT Goal Formulation: With patient  OT Frequency:                    AM-PAC OT "6 Clicks" Daily Activity     Outcome Measure Help from another person eating meals?: None Help from another person taking care of personal grooming?: None Help from another person toileting, which includes using toliet, bedpan, or urinal?: None Help from another person bathing (including washing, rinsing, drying)?: None Help from another person to put on and taking off regular upper body clothing?: None Help from another person to put on and taking off regular lower body clothing?: None 6 Click Score: 24   End of Session    Activity Tolerance: Patient tolerated  treatment well Patient left: in bed;with call bell/phone within reach  OT Visit Diagnosis: Muscle weakness (generalized) (M62.81)                Time: 8786-7672 OT Time Calculation (min): 12 min Charges:  OT General Charges $OT Visit: 1 Visit OT Evaluation $OT Eval Low Complexity: 1 Low    Frederic Jericho, OTR/L 11/29/2021, 11:37 AM

## 2021-11-29 NOTE — Evaluation (Signed)
Physical Therapy Evaluation Patient Details Name: Veronica Moses MRN: [id number] DOB: November 03, 1955 Today's Date: 11/29/2021  History of Present Illness  Ophelia Sipe  is a 66 y.o. female, with past medical history of COPD, diabetes mellitus, seizure disorder, tobacco abuse, she presents to ED secondary to nausea, vomiting and abdominal pain, reports has been having intermittent symptoms for last 3 weeks, diarrhea she described it as watery, intermittent abdominal pain, no relieving or provoking factor, nausea and vomiting, with very poor oral intake to solids and liquids, denies any fever, chills, sick contact, reports she is compliant with her medications, she denies any dysuria or polyuria.  -In ED she was afebrile, but her blood pressure initially soft, which did respond to fluid boluses, her work-up significant for sodium of 131, glucose of 372, creatinine of 1.87, lipase mildly elevated at 92, but CT abdomen pelvis with no finding of pancreatitis, she denies any alcohol use, bladder scan in ED showing 0 post residual volume, white blood cell count elevated at 14, hemoglobin elevated at 15, [facility] consulted to admit.   Clinical Impression  Patient demonstrates good return for bed mobility and transferring to chair without AD, slightly labored cadence during ambulation in hallway without loss of balance, once fatigue required hand held assist with spouse helping and tolerated sitting up in chair after therapy.  Patient encouraged to ambulate ad lib in room and with family/nursing staff in hallway for length of stay with understanding acknowledged.  Plan:  Patient discharged from physical therapy to care of nursing for ambulation daily as tolerated for length of stay.        Recommendations for follow up therapy are one component of a multi-disciplinary discharge planning process, led by the attending physician.  Recommendations may be updated based on patient status, additional functional criteria  and insurance authorization.  Follow Up Recommendations No PT follow up      Assistance Recommended at Discharge Set up Supervision/Assistance  Patient can return home with the following  A little help with walking and/or transfers;A little help with bathing/dressing/bathroom;Help with stairs or ramp for entrance;Assistance with cooking/housework    Equipment Recommendations None recommended by PT  Recommendations for Other Services       Functional Status Assessment Patient has had a recent decline in their functional status and/or demonstrates limited ability to make significant improvements in function in a reasonable and predictable amount of time     Precautions / Restrictions Precautions Precautions: Fall Restrictions Weight Bearing Restrictions: No      Mobility  Bed Mobility Overal bed mobility: Independent                  Transfers Overall transfer level: Independent                      Ambulation/Gait Ambulation/Gait assistance: Supervision Gait Distance (Feet): 100 Feet Assistive device: None, 1 person hand held assist Gait Pattern/deviations: Decreased step length - right, Decreased step length - left, Decreased stride length Gait velocity: decreased     General Gait Details: slightly labored cadence without loss of balance, once fatigued required hand held assist to return to room  Stairs            Wheelchair Mobility    Modified Rankin (Stroke Patients Only)       Balance Overall balance assessment: Needs assistance Sitting-balance support: Feet supported, No upper extremity supported Sitting balance-Leahy Scale: Good Sitting balance - Comments: seated at EOB   Standing balance  support: During functional activity, No upper extremity supported Standing balance-Leahy Scale: Fair                               Pertinent Vitals/Pain Pain Assessment Pain Assessment: No/denies pain    Home Living  Family/patient expects to be discharged to:: Private residence Living Arrangements: Spouse/significant other Available Help at Discharge: Family Type of Home: Mobile home Home Access: Level entry       Home Layout: One level Home Equipment: Conservation officer, nature (2 wheels);Cane - single point      Prior Function Prior Level of Function : Independent/Modified Independent             Mobility Comments: 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, does not drive ADLs Comments: pt reports she was independent in all ADLS prior to hospitalization, husband provided assist for IALDS (cleaning/cooking/driving)     Hand Dominance   Dominant Hand: Right    Extremity/Trunk Assessment   Upper Extremity Assessment Upper Extremity Assessment: Defer to OT evaluation    Lower Extremity Assessment Lower Extremity Assessment: Overall WFL for tasks assessed    Cervical / Trunk Assessment Cervical / Trunk Assessment: Normal  Communication   Communication: No difficulties  Cognition Arousal/Alertness: Awake/alert Behavior During Therapy: WFL for tasks assessed/performed Overall Cognitive Status: Within Functional Limits for tasks assessed                                          General Comments      Exercises     Assessment/Plan    PT Assessment Patient does not need any further PT services  PT Problem List         PT Treatment Interventions      PT Goals (Current goals can be found in the Care Plan section)  Acute Rehab PT Goals Patient Stated Goal: return home with family to assist PT Goal Formulation: With patient/family Time For Goal Achievement: 11/29/21 Potential to Achieve Goals: Good    Frequency       Co-evaluation               AM-PAC PT "6 Clicks" Mobility  Outcome Measure Help needed turning from your back to your side while in a flat bed without using bedrails?: None Help needed moving from lying on your back to sitting on the side of a flat  bed without using bedrails?: None Help needed moving to and from a bed to a chair (including a wheelchair)?: None Help needed standing up from a chair using your arms (e.g., wheelchair or bedside chair)?: None Help needed to walk in hospital room?: A Little Help needed climbing 3-5 steps with a railing? : A Little 6 Click Score: 22    End of Session   Activity Tolerance: Patient tolerated treatment well;Patient limited by fatigue Patient left: in chair;with call bell/phone within reach;with family/visitor present Nurse Communication: Mobility status PT Visit Diagnosis: Unsteadiness on feet (R26.81);Other abnormalities of gait and mobility (R26.89);Muscle weakness (generalized) (M62.81)    Time: 9833-8250 PT Time Calculation (min) (ACUTE ONLY): 15 min   Charges:   PT Evaluation $PT Eval Low Complexity: 1 Low PT Treatments $Therapeutic Activity: 8-22 mins        12:07 PM, 11/29/21 Lonell Grandchild, MPT Physical Therapist with [facility] [phone] office [phone] mobile phone

## 2021-11-29 NOTE — Hospital Course (Addendum)
Veronica Moses is a 66 y.o. female with a history of COPD, diabetes mellitus, seizure disorder, tobacco abuse. Patient presented secondary to abdominal pain, nausea and vomiting and was found to have severe hypokalemia and an AKI. She was started on IV fluids and given potassium supplementation for treatment. Patient with persistent high volume diarrhea with continued risk for recurrent severe electrolyte abnormalities.

## 2021-11-29 NOTE — Progress Notes (Signed)
  Transition of Care [facility]) Screening Note   Patient Details  Name: Veronica Moses Date of Birth: 12/07/1955   Transition of Care [facility]) CM/SW Contact:    Iona Beard, [place] Phone Number: 11/29/2021, 11:45 AM    Transition of Care Department [facility]) has reviewed patient and no TOC needs have been identified at this time. We will continue to monitor patient advancement through interdisciplinary progression rounds. If new patient transition needs arise, please place a TOC consult.

## 2021-11-30 DIAGNOSIS — R569 Unspecified convulsions: Secondary | ICD-10-CM | POA: Diagnosis not present

## 2021-11-30 DIAGNOSIS — E43 Unspecified severe protein-calorie malnutrition: Secondary | ICD-10-CM | POA: Diagnosis not present

## 2021-11-30 DIAGNOSIS — N179 Acute kidney failure, unspecified: Secondary | ICD-10-CM | POA: Diagnosis not present

## 2021-11-30 LAB — BASIC METABOLIC PANEL
Anion gap: 5 (ref 5–15)
BUN: 10 mg/dL (ref 8–23)
CO2: 23 mmol/L (ref 22–32)
Calcium: 8.2 mg/dL — ABNORMAL LOW (ref 8.9–10.3)
Chloride: 111 mmol/L (ref 98–111)
Creatinine, Ser: 0.77 mg/dL (ref 0.44–1.00)
GFR, Estimated: >60 mL/min (ref >60)
Glucose, Bld: 76 mg/dL (ref 70–99)
Potassium: 2.6 mmol/L — CL (ref 3.5–5.1)
Sodium: 139 mmol/L (ref 135–145)

## 2021-11-30 LAB — GLUCOSE, CAPILLARY
Glucose-Capillary: 73 mg/dL (ref 70–99)
Glucose-Capillary: 125 mg/dL — ABNORMAL HIGH (ref 70–99)
Glucose-Capillary: 75 mg/dL (ref 70–99)

## 2021-11-30 LAB — MAGNESIUM: Magnesium: 1.3 mg/dL — ABNORMAL LOW (ref 1.7–2.4)

## 2021-11-30 MED ORDER — HYDROCHLOROTHIAZIDE 12.5 MG PO CAPS: 12.5000 mg | ORAL_CAPSULE | Freq: Every day | ORAL | Status: DC

## 2021-11-30 MED ORDER — LOSARTAN POTASSIUM 50 MG PO TABS: 50.0000 mg | ORAL_TABLET | Freq: Every day | ORAL | Status: DC

## 2021-11-30 MED ORDER — POTASSIUM CHLORIDE CRYS ER 20 MEQ PO TBCR
40.0000 meq | EXTENDED_RELEASE_TABLET | ORAL | Status: AC
  Administered 2021-11-30 (×3): 40 meq via ORAL
  Filled 2021-11-30 (×3): qty 2

## 2021-11-30 MED ORDER — POTASSIUM CHLORIDE ER 20 MEQ PO TBCR: 20.0000 meq | EXTENDED_RELEASE_TABLET | Freq: Two times a day (BID) | ORAL | 0 refills | Status: DC

## 2021-11-30 MED ORDER — INSULIN GLARGINE 100 UNIT/ML SOLOSTAR PEN: 10.0000 [IU] | PEN_INJECTOR | Freq: Every day | SUBCUTANEOUS | Status: DC

## 2021-11-30 MED ORDER — MAGNESIUM SULFATE 4 GM/100ML IV SOLN
4.0000 g | Freq: Once | INTRAVENOUS | Status: AC
  Administered 2021-11-30: 4 g via INTRAVENOUS
  Filled 2021-11-30: qty 100

## 2021-11-30 NOTE — Progress Notes (Addendum)
Date and time results received: 11/30/21 0755  Test: Potassium Critical Value: 2.6  Name of Provider Notified: Cordelia Poche via secure chat by charge nurse Emiliano Dyer, RN.   Orders Received? Or Actions Taken?: See new orders

## 2021-11-30 NOTE — Progress Notes (Signed)
Ng Discharge Note  Admit Date:  11/28/2021 Discharge date: 11/30/2021   Neiva Maenza to be D/C'd Home per MD order.  AVS completed. Patient/caregiver able to verbalize understanding.  Discharge Medication: Allergies as of 11/30/2021       Reactions   Demerol Other (See Comments)   hallucinations   Depakote [divalproex Sodium] Other (See Comments)   Sore/blisters   Dilaudid [hydromorphone Hcl] Nausea And Vomiting   Doxycycline Other (See Comments)   "Blistered"   Valproic Acid Rash        Medication List     STOP taking these medications    ibuprofen 200 MG tablet Commonly known as: ADVIL       TAKE these medications    acetaminophen 325 MG tablet Commonly known as: TYLENOL Take 325 mg by mouth every 6 (six) hours as needed for mild pain or moderate pain.   ALPRAZolam 0.5 MG tablet Commonly known as: XANAX Take 0.5 mg by mouth 3 (three) times daily as needed.   aspirin 81 MG chewable tablet Chew 81 mg by mouth daily.   divalproex 250 MG DR tablet Commonly known as: DEPAKOTE Take 250 mg by mouth 2 (two) times daily.   escitalopram 20 MG tablet Commonly known as: LEXAPRO Take 20 mg by mouth daily.   gabapentin 100 MG capsule Commonly known as: NEURONTIN Take 200 mg by mouth at bedtime.   hydrochlorothiazide 12.5 MG capsule Commonly known as: MICROZIDE Take 1 capsule (12.5 mg total) by mouth daily. Start taking on: December 04, 2021 What changed: These instructions start on December 04, 2021. If you are unsure what to do until then, ask your doctor or other care provider.   insulin glargine 100 UNIT/ML Solostar Pen Commonly known as: LANTUS Inject 10 Units into the skin daily. What changed: how much to take   levETIRAcetam 1000 MG tablet Commonly known as: KEPPRA Take 1,000 mg by mouth 2 (two) times daily.   losartan 50 MG tablet Commonly known as: COZAAR Take 1 tablet (50 mg total) by mouth daily. Start taking on: December 04, 2021 What  changed: These instructions start on December 04, 2021. If you are unsure what to do until then, ask your doctor or other care provider.   NovoLOG FlexPen 100 UNIT/ML FlexPen Generic drug: insulin aspart Please use the hospital sliding scale provided for you What changed: additional instructions   Potassium Chloride ER 20 MEQ Tbcr Take 20 mEq by mouth 2 (two) times daily for 3 days. What changed:  medication strength how much to take when to take this   rosuvastatin 20 MG tablet Commonly known as: CRESTOR Take 20 mg by mouth daily.        Discharge Assessment: Vitals:   11/30/21 0449 11/30/21 1508  BP: (!) 144/55 (!) 130/50  Pulse: 88 77  Resp: 16 15  Temp: 98.5 F (36.9 C) 98 F (36.7 C)  SpO2: 95% 99%   Skin clean, dry and intact without evidence of skin break down, no evidence of skin tears noted. IV catheter discontinued intact. Site without signs and symptoms of complications - no redness or edema noted at insertion site, patient denies c/o pain - only slight tenderness at site.  Dressing with slight pressure applied.  D/c Instructions-Education: Discharge instructions given to patient/family with verbalized understanding. D/c education completed with patient/family including follow up instructions, medication list, d/c activities limitations if indicated, with other d/c instructions as indicated by MD - patient able to verbalize understanding, all questions fully answered.  Patient instructed to return to ED, call 911, or call MD for any changes in condition.  Patient escorted via [place], and D/C home via private auto.  Tsosie Billing, LPN 4/41/7127 8:71 PM

## 2021-11-30 NOTE — Discharge Summary (Signed)
Physician Discharge Summary   Patient: Veronica Moses MRN: [id number] DOB: [birth year]  Admit date:     11/28/2021  Discharge date: 11/30/21  Discharge Physician: Cordelia Poche, MD   PCP: Neale Burly, MD   Recommendations at discharge:  PCP follow-up Restart home antihypertensives once better hydrated  Discharge Diagnoses: Principal Problem:   AKI (acute kidney injury) ([place]) Active Problems:   Diabetes ([place])   COPD (chronic obstructive pulmonary disease) ([facility])   GERD (gastroesophageal reflux disease)   Protein-calorie malnutrition, severe ([place])   Seizure ([place])  Resolved Problems:   * No resolved hospital problems. *  Hospital Course: Veronica Moses is a 66 y.o. female with a history of COPD, diabetes mellitus, seizure disorder, tobacco abuse. Patient presented secondary to abdominal pain, nausea and vomiting and was found to have severe hypokalemia and an AKI. She was started on IV fluids and given potassium supplementation for treatment. Diarrheal illness improved slowly and patient discharged with potassium supplementation and recommendations to hold antihypertensives for a few days. Patient to follow-up with PCP.  Assessment and Plan:  AKI Baseline creatinine of 0.8-0.9. Creatinine of 1.87 on admission which has improved to 1.07 on BMP today with IV fluids. -Continue IV fluids while having persistent diarrhea.   Nausea/vomiting/diarrhea Presumed infectious gastroenteritis. C. Difficile testing is positive for antigen but PCR negative. Diarrhea frequency and consistency improved prior to discharge patient tolerated a soft diet.   Elevated lipase Mild. No associated epigastric pain/tenderness. CT scan significant for atrophied pancreas.   Hyponatremia Mild. 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 Diabetes is uncontrolled with hyperglycemia and now hypoglycemia in setting of poor oral intake. Most recent hemoglobin A1C of 8.5%.  Patient is on insulin glargine 28 units daily and insulin aspart sliding scale as an outpatient. Decreased to insulin glargine 10 units on discharge. Titrate back up as needed.   Seizure disorder Continue Keppra and Depakote   Primary hypertension Patient is managed on losartan and hydrochlorothiazide as an outpatient. Antihypertensives held secondary to dehydration and AKI. Blood pressure currently normotensive.   COPD Stable. No signs of exacerbation.   Tobacco abuse Counseled on admission.   Severe malnutrition Dietitian recommendations (9/15): Boost Breeze po TID, each supplement provides 250 kcal and 9 grams of protein.   Consultants: None Procedures performed: None  Disposition: Home Diet recommendation: Soft diet  DISCHARGE MEDICATION: Allergies as of 11/30/2021       Reactions   Demerol Other (See Comments)   hallucinations   Depakote [divalproex Sodium] Other (See Comments)   Sore/blisters   Dilaudid [hydromorphone Hcl] Nausea And Vomiting   Doxycycline Other (See Comments)   "Blistered"   Valproic Acid Rash        Medication List     STOP taking these medications    ibuprofen 200 MG tablet Commonly known as: ADVIL       TAKE these medications    acetaminophen 325 MG tablet Commonly known as: TYLENOL Take 325 mg by mouth every 6 (six) hours as needed for mild pain or moderate pain.   ALPRAZolam 0.5 MG tablet Commonly known as: XANAX Take 0.5 mg by mouth 3 (three) times daily as needed.   aspirin 81 MG chewable tablet Chew 81 mg by mouth daily.   divalproex 250 MG DR tablet Commonly known as: DEPAKOTE Take 250 mg by mouth 2 (two) times daily.   escitalopram 20 MG tablet Commonly known as: LEXAPRO Take 20 mg by mouth  daily.   gabapentin 100 MG capsule Commonly known as: NEURONTIN Take 200 mg by mouth at bedtime.   hydrochlorothiazide 12.5 MG capsule Commonly known as: MICROZIDE Take 1 capsule (12.5 mg total) by mouth daily. Start  taking on: December 04, 2021 What changed: These instructions start on December 04, 2021. If you are unsure what to do until then, ask your doctor or other care provider.   insulin glargine 100 UNIT/ML Solostar Pen Commonly known as: LANTUS Inject 10 Units into the skin daily. What changed: how much to take   levETIRAcetam 1000 MG tablet Commonly known as: KEPPRA Take 1,000 mg by mouth 2 (two) times daily.   losartan 50 MG tablet Commonly known as: COZAAR Take 1 tablet (50 mg total) by mouth daily. Start taking on: December 04, 2021 What changed: These instructions start on December 04, 2021. If you are unsure what to do until then, ask your doctor or other care provider.   NovoLOG FlexPen 100 UNIT/ML FlexPen Generic drug: insulin aspart Please use the hospital sliding scale provided for you What changed: additional instructions   Potassium Chloride ER 20 MEQ Tbcr Take 20 mEq by mouth 2 (two) times daily for 3 days. What changed:  medication strength how much to take when to take this   rosuvastatin 20 MG tablet Commonly known as: CRESTOR Take 20 mg by mouth daily.        Discharge Exam: BP (!) 130/50 (BP Location: Right Arm)   Pulse 77   Temp 98 F (36.7 C) (Oral)   Resp 15   Ht '5\' 1"'$  (1.549 m)   Wt 47.6 kg   SpO2 99%   BMI 19.84 kg/m   General exam: Appears calm and comfortable Respiratory system: Clear to auscultation. Respiratory effort normal. Cardiovascular system: S1 & S2 heard, RRR. No murmurs, rubs, gallops or clicks. Gastrointestinal system: Abdomen is nondistended, soft and nontender. No organomegaly or masses felt. Normal bowel sounds heard. Central nervous system: Alert and oriented. No focal neurological deficits. Musculoskeletal: No edema. No calf tenderness Skin: No cyanosis. No rashes Psychiatry: Judgement and insight appear normal. Mood & affect appropriate.   Condition at discharge: stable  The results of significant diagnostics from  this hospitalization (including imaging, microbiology, ancillary and laboratory) are listed below for reference.   Imaging Studies: CT ABDOMEN PELVIS WO CONTRAST  Result Date: 11/28/2021 CLINICAL DATA:  Abdominal pain, acute, nonlocalized EXAM: CT ABDOMEN AND PELVIS WITHOUT CONTRAST TECHNIQUE: Multidetector CT imaging of the abdomen and pelvis was performed following the standard protocol without IV contrast. RADIATION DOSE REDUCTION: This exam was performed according to the departmental dose-optimization program which includes automated exposure control, adjustment of the mA and/or kV according to patient size and/or use of iterative reconstruction technique. COMPARISON:  January 2021 FINDINGS: Lower chest: No acute abnormality. Hepatobiliary: No focal liver abnormality is seen. Status post cholecystectomy. No biliary dilatation. Pancreas: Atrophy.  Otherwise unremarkable. Spleen: Normal in size without focal abnormality. Adrenals/Urinary Tract: Adrenals are unremarkable. Left nephrectomy. Compensatory right renal hypertrophy. Bladder is unremarkable. Stomach/Bowel: Stomach is within normal limits. Bowel is normal in caliber. Limited evaluation for wall thickening due to poor distension. Vascular/Lymphatic: Atherosclerosis.  No enlarged nodes. Reproductive: Status post hysterectomy. No adnexal masses. Other: No free fluid.  Abdominal wall is unremarkable. Musculoskeletal: No acute osseous abnormality. IMPRESSION: No acute abnormality. Electronically Signed   By: Macy Mis M.D.   On: 11/28/2021 14:28    Microbiology: Results for orders placed or performed during the hospital encounter  of 11/28/21  C Difficile Quick Screen w PCR reflex     Status: Abnormal   Collection Time: 11/29/21  1:29 AM   Specimen: STOOL  Result Value Ref Range Status   C Diff antigen POSITIVE (A) NEGATIVE Final   C Diff toxin NEGATIVE NEGATIVE Final   C Diff interpretation Results are indeterminate. See PCR results.  Final     Comment: Performed at [facility], [address], [place], [place] [address]  Gastrointestinal Panel by PCR , Stool     Status: None   Collection Time: 11/29/21  1:29 AM   Specimen: STOOL  Result Value Ref Range Status   Campylobacter species NOT DETECTED NOT DETECTED Final   Plesimonas shigelloides NOT DETECTED NOT DETECTED Final   Salmonella species NOT DETECTED NOT DETECTED Final   Yersinia enterocolitica NOT DETECTED NOT DETECTED Final   Vibrio species NOT DETECTED NOT DETECTED Final   Vibrio cholerae NOT DETECTED NOT DETECTED Final   Enteroaggregative E coli (EAEC) NOT DETECTED NOT DETECTED Final   Enteropathogenic E coli (EPEC) NOT DETECTED NOT DETECTED Final   Enterotoxigenic E coli (ETEC) NOT DETECTED NOT DETECTED Final   Shiga like toxin producing E coli (STEC) NOT DETECTED NOT DETECTED Final   Shigella/Enteroinvasive E coli (EIEC) NOT DETECTED NOT DETECTED Final   Cryptosporidium NOT DETECTED NOT DETECTED Final   Cyclospora cayetanensis NOT DETECTED NOT DETECTED Final   Entamoeba histolytica NOT DETECTED NOT DETECTED Final   Giardia lamblia NOT DETECTED NOT DETECTED Final   Adenovirus F40/41 NOT DETECTED NOT DETECTED Final   Astrovirus NOT DETECTED NOT DETECTED Final   Norovirus GI/GII NOT DETECTED NOT DETECTED Final   Rotavirus A NOT DETECTED NOT DETECTED Final   Sapovirus (I, II, IV, and V) NOT DETECTED NOT DETECTED Final    Comment: Performed at [facility], [address] [place], [place] [address]  C. Diff by PCR, Reflexed     Status: None   Collection Time: 11/29/21  1:29 AM  Result Value Ref Range Status   Toxigenic C. Difficile by PCR NEGATIVE NEGATIVE Final    Comment: Patient is colonized with non toxigenic C. difficile. May not need treatment unless significant symptoms are present. Performed at [facility], [place] [address] [place], [place] [address]     Labs: CBC: Recent Labs  Lab 11/28/21 1123 11/29/21 0345  WBC 14.3* 12.6*  HGB  15.2* 11.8*  HCT 44.3 34.1*  MCV 90.2 91.2  PLT 456* 992   Basic Metabolic Panel: Recent Labs  Lab 11/28/21 1123 11/28/21 1524 11/28/21 1600 11/28/21 2041 11/29/21 0345 11/29/21 1548 11/30/21 0721  NA 131*  --   --   --  137  --  139  K 3.6  --   --   --  2.7* 3.6 2.6*  CL 98  --   --   --  106  --  111  CO2 21*  --   --   --  22  --  23  GLUCOSE 372*  --   --   --  61*  --  76  BUN 40*  --   --   --  24*  --  10  CREATININE 1.87*  --   --  1.33* 1.07*  --  0.77  CALCIUM 9.5  --   --   --  8.5*  --  8.2*  MG  --   --  1.7  --   --   --  1.3*  PHOS  --  4.4  --   --   --   --   --    Liver Function Tests: Recent Labs  Lab 11/28/21 1123  AST 18  ALT 18  ALKPHOS 34*  BILITOT 0.9  PROT 8.2*  ALBUMIN 3.8   CBG: Recent Labs  Lab 11/29/21 1654 11/29/21 1807 11/29/21 2106 11/30/21 0734 11/30/21 1109  GLUCAP 52* 143* 227* 73 125*    Discharge time spent: 35 minutes.  Signed: Cordelia Poche, MD [facility] 11/30/2021

## 2021-11-30 NOTE — Discharge Instructions (Signed)
Veronica Moses,  You were in the hospital with diarrhea and dehydration. You also had low potassium and magnesium. Your symptoms have improved. I am recommending that you stop your blood pressure medication for a few days while you increase your oral intake and feel better to avoid continued dehydration. I have also recommended for you to decrease your insulin until you are eating more regularly. Please ensure you keep to a low fiber diet until your diarrhea resolves.

## 2021-12-03 LAB — POTASSIUM: Potassium: 3.2 mmol/L — ABNORMAL LOW (ref 3.5–5.1)

## 2021-12-03 LAB — CBC
HCT: 34.1 % — ABNORMAL LOW (ref 36.0–46.0)
Hemoglobin: 11.8 g/dL — ABNORMAL LOW (ref 12.0–15.0)
MCH: 31.6 pg (ref 26.0–34.0)
MCHC: 34.6 g/dL (ref 30.0–36.0)
MCV: 91.2 fL (ref 80.0–100.0)
Platelets: 329 10*3/uL (ref 150–400)
RBC: 3.74 MIL/uL — ABNORMAL LOW (ref 3.87–5.11)
RDW: 12.4 % (ref 11.5–15.5)
WBC: 12.6 10*3/uL — ABNORMAL HIGH (ref 4.0–10.5)
nRBC: 0 % (ref 0.0–0.2)

## 2021-12-10 DIAGNOSIS — I700 Atherosclerosis of aorta: Secondary | ICD-10-CM | POA: Diagnosis not present

## 2021-12-10 DIAGNOSIS — E1121 Type 2 diabetes mellitus with diabetic nephropathy: Secondary | ICD-10-CM | POA: Diagnosis not present

## 2021-12-10 DIAGNOSIS — F339 Major depressive disorder, recurrent, unspecified: Secondary | ICD-10-CM | POA: Diagnosis not present

## 2021-12-10 DIAGNOSIS — J449 Chronic obstructive pulmonary disease, unspecified: Secondary | ICD-10-CM | POA: Diagnosis not present

## 2021-12-10 DIAGNOSIS — F419 Anxiety disorder, unspecified: Secondary | ICD-10-CM | POA: Diagnosis not present

## 2021-12-10 DIAGNOSIS — K2970 Gastritis, unspecified, without bleeding: Secondary | ICD-10-CM | POA: Diagnosis not present

## 2021-12-10 DIAGNOSIS — Z6822 Body mass index (BMI) 22.0-22.9, adult: Secondary | ICD-10-CM | POA: Diagnosis not present

## 2021-12-10 DIAGNOSIS — G40911 Epilepsy, unspecified, intractable, with status epilepticus: Secondary | ICD-10-CM | POA: Diagnosis not present

## 2021-12-16 DIAGNOSIS — Z13820 Encounter for screening for osteoporosis: Secondary | ICD-10-CM | POA: Diagnosis not present

## 2021-12-16 DIAGNOSIS — M810 Age-related osteoporosis without current pathological fracture: Secondary | ICD-10-CM | POA: Diagnosis not present

## 2021-12-23 DIAGNOSIS — Z85828 Personal history of other malignant neoplasm of skin: Secondary | ICD-10-CM | POA: Insufficient documentation

## 2021-12-23 DIAGNOSIS — R197 Diarrhea, unspecified: Secondary | ICD-10-CM | POA: Diagnosis not present

## 2021-12-23 DIAGNOSIS — E785 Hyperlipidemia, unspecified: Secondary | ICD-10-CM | POA: Diagnosis not present

## 2021-12-23 DIAGNOSIS — G629 Polyneuropathy, unspecified: Secondary | ICD-10-CM | POA: Diagnosis not present

## 2021-12-23 DIAGNOSIS — G40909 Epilepsy, unspecified, not intractable, without status epilepticus: Secondary | ICD-10-CM | POA: Diagnosis not present

## 2021-12-23 DIAGNOSIS — E876 Hypokalemia: Secondary | ICD-10-CM | POA: Diagnosis not present

## 2021-12-23 DIAGNOSIS — I519 Heart disease, unspecified: Secondary | ICD-10-CM | POA: Insufficient documentation

## 2021-12-23 DIAGNOSIS — N289 Disorder of kidney and ureter, unspecified: Secondary | ICD-10-CM | POA: Insufficient documentation

## 2021-12-23 DIAGNOSIS — I10 Essential (primary) hypertension: Secondary | ICD-10-CM | POA: Diagnosis not present

## 2021-12-23 DIAGNOSIS — F418 Other specified anxiety disorders: Secondary | ICD-10-CM | POA: Diagnosis not present

## 2021-12-23 DIAGNOSIS — Z7689 Persons encountering health services in other specified circumstances: Secondary | ICD-10-CM | POA: Diagnosis not present

## 2021-12-23 DIAGNOSIS — Z13820 Encounter for screening for osteoporosis: Secondary | ICD-10-CM | POA: Diagnosis not present

## 2021-12-23 DIAGNOSIS — E7800 Pure hypercholesterolemia, unspecified: Secondary | ICD-10-CM | POA: Insufficient documentation

## 2021-12-23 DIAGNOSIS — N1330 Unspecified hydronephrosis: Secondary | ICD-10-CM | POA: Insufficient documentation

## 2021-12-31 DIAGNOSIS — Z23 Encounter for immunization: Secondary | ICD-10-CM | POA: Diagnosis not present

## 2022-01-06 DIAGNOSIS — E1165 Type 2 diabetes mellitus with hyperglycemia: Secondary | ICD-10-CM | POA: Diagnosis not present

## 2022-01-06 DIAGNOSIS — G629 Polyneuropathy, unspecified: Secondary | ICD-10-CM | POA: Diagnosis not present

## 2022-01-06 DIAGNOSIS — G4700 Insomnia, unspecified: Secondary | ICD-10-CM | POA: Diagnosis not present

## 2022-01-06 DIAGNOSIS — E876 Hypokalemia: Secondary | ICD-10-CM | POA: Diagnosis not present

## 2022-01-06 DIAGNOSIS — I10 Essential (primary) hypertension: Secondary | ICD-10-CM | POA: Diagnosis not present

## 2022-01-06 DIAGNOSIS — M810 Age-related osteoporosis without current pathological fracture: Secondary | ICD-10-CM | POA: Diagnosis not present

## 2022-01-06 DIAGNOSIS — G40909 Epilepsy, unspecified, not intractable, without status epilepticus: Secondary | ICD-10-CM | POA: Diagnosis not present

## 2022-01-06 DIAGNOSIS — E785 Hyperlipidemia, unspecified: Secondary | ICD-10-CM | POA: Diagnosis not present

## 2022-01-06 DIAGNOSIS — F418 Other specified anxiety disorders: Secondary | ICD-10-CM | POA: Diagnosis not present

## 2022-02-03 ENCOUNTER — Ambulatory Visit: Payer: Medicare HMO | Admitting: Neurology

## 2022-02-27 DIAGNOSIS — E1165 Type 2 diabetes mellitus with hyperglycemia: Secondary | ICD-10-CM | POA: Diagnosis not present

## 2022-03-04 ENCOUNTER — Ambulatory Visit: Payer: Medicare HMO | Admitting: Neurology

## 2022-03-04 ENCOUNTER — Encounter: Payer: Self-pay | Admitting: Neurology

## 2022-03-19 ENCOUNTER — Other Ambulatory Visit: Payer: Self-pay | Admitting: Family Medicine

## 2022-04-23 DIAGNOSIS — I10 Essential (primary) hypertension: Secondary | ICD-10-CM | POA: Diagnosis not present

## 2022-04-23 DIAGNOSIS — G40909 Epilepsy, unspecified, not intractable, without status epilepticus: Secondary | ICD-10-CM | POA: Diagnosis not present

## 2022-04-23 DIAGNOSIS — F418 Other specified anxiety disorders: Secondary | ICD-10-CM | POA: Diagnosis not present

## 2022-04-23 DIAGNOSIS — M810 Age-related osteoporosis without current pathological fracture: Secondary | ICD-10-CM | POA: Diagnosis not present

## 2022-04-23 DIAGNOSIS — I119 Hypertensive heart disease without heart failure: Secondary | ICD-10-CM | POA: Diagnosis not present

## 2022-04-23 DIAGNOSIS — E1165 Type 2 diabetes mellitus with hyperglycemia: Secondary | ICD-10-CM | POA: Diagnosis not present

## 2022-04-23 DIAGNOSIS — G629 Polyneuropathy, unspecified: Secondary | ICD-10-CM | POA: Diagnosis not present

## 2022-04-23 DIAGNOSIS — E876 Hypokalemia: Secondary | ICD-10-CM | POA: Diagnosis not present

## 2022-04-23 DIAGNOSIS — E785 Hyperlipidemia, unspecified: Secondary | ICD-10-CM | POA: Diagnosis not present

## 2022-04-30 ENCOUNTER — Ambulatory Visit: Payer: Self-pay

## 2022-05-01 ENCOUNTER — Other Ambulatory Visit (HOSPITAL_COMMUNITY): Payer: Self-pay | Admitting: Family Medicine

## 2022-05-01 ENCOUNTER — Ambulatory Visit (HOSPITAL_COMMUNITY)
Admission: RE | Admit: 2022-05-01 | Discharge: 2022-05-01 | Disposition: A | Discharge status: Home / Self Care | Payer: Medicare HMO | Source: Ambulatory Visit | Attending: Family Medicine | Admitting: Family Medicine

## 2022-05-01 DIAGNOSIS — R630 Anorexia: Secondary | ICD-10-CM | POA: Diagnosis not present

## 2022-05-01 DIAGNOSIS — R059 Cough, unspecified: Secondary | ICD-10-CM

## 2022-05-01 DIAGNOSIS — R053 Chronic cough: Secondary | ICD-10-CM | POA: Diagnosis not present

## 2022-05-01 DIAGNOSIS — J029 Acute pharyngitis, unspecified: Secondary | ICD-10-CM | POA: Diagnosis not present

## 2022-05-01 DIAGNOSIS — E1165 Type 2 diabetes mellitus with hyperglycemia: Secondary | ICD-10-CM | POA: Diagnosis not present

## 2022-05-01 DIAGNOSIS — I10 Essential (primary) hypertension: Secondary | ICD-10-CM | POA: Diagnosis not present

## 2022-05-01 DIAGNOSIS — R1084 Generalized abdominal pain: Secondary | ICD-10-CM | POA: Diagnosis not present

## 2022-05-01 DIAGNOSIS — R32 Unspecified urinary incontinence: Secondary | ICD-10-CM | POA: Diagnosis not present

## 2022-05-01 DIAGNOSIS — R531 Weakness: Secondary | ICD-10-CM | POA: Diagnosis not present

## 2022-05-01 DIAGNOSIS — R109 Unspecified abdominal pain: Secondary | ICD-10-CM | POA: Diagnosis not present

## 2022-05-01 DIAGNOSIS — R410 Disorientation, unspecified: Secondary | ICD-10-CM | POA: Diagnosis not present

## 2022-05-15 ENCOUNTER — Encounter: Payer: Self-pay | Admitting: Radiology

## 2022-05-15 DIAGNOSIS — R059 Cough, unspecified: Secondary | ICD-10-CM | POA: Diagnosis not present

## 2022-05-15 DIAGNOSIS — R350 Frequency of micturition: Secondary | ICD-10-CM | POA: Diagnosis not present

## 2022-05-15 DIAGNOSIS — G4700 Insomnia, unspecified: Secondary | ICD-10-CM | POA: Diagnosis not present

## 2022-05-15 DIAGNOSIS — R300 Dysuria: Secondary | ICD-10-CM | POA: Diagnosis not present

## 2022-05-15 DIAGNOSIS — I10 Essential (primary) hypertension: Secondary | ICD-10-CM | POA: Diagnosis not present

## 2022-05-15 DIAGNOSIS — F17200 Nicotine dependence, unspecified, uncomplicated: Secondary | ICD-10-CM | POA: Diagnosis not present

## 2022-05-28 DIAGNOSIS — E1165 Type 2 diabetes mellitus with hyperglycemia: Secondary | ICD-10-CM | POA: Diagnosis not present

## 2022-06-04 ENCOUNTER — Encounter: Payer: Self-pay | Admitting: Neurology

## 2022-06-04 ENCOUNTER — Ambulatory Visit (INDEPENDENT_AMBULATORY_CARE_PROVIDER_SITE_OTHER): Payer: Medicare HMO | Admitting: Neurology

## 2022-06-04 VITALS — BP 118/69 | HR 97 | Ht 61.0 in | Wt 105.0 lb

## 2022-06-04 DIAGNOSIS — Z5181 Encounter for therapeutic drug level monitoring: Secondary | ICD-10-CM | POA: Diagnosis not present

## 2022-06-04 DIAGNOSIS — G40909 Epilepsy, unspecified, not intractable, without status epilepticus: Secondary | ICD-10-CM

## 2022-06-04 MED ORDER — LEVETIRACETAM 1000 MG PO TABS: 1000.0000 mg | ORAL_TABLET | Freq: Two times a day (BID) | ORAL | 3 refills | Status: DC

## 2022-06-04 NOTE — Patient Instructions (Signed)
Continue with Keppra 1000 mg twice daily, refill given Continue your other medications Follow-up in 1 year or sooner if worse.

## 2022-06-04 NOTE — Progress Notes (Signed)
[facility]  PATIENT: Veronica Moses DOB: November 12, 1955  REQUESTING CLINICIAN: Cecile Sheerer, NP HISTORY FROM: Patient, husband  REASON FOR VISIT: Here to establish care for epilepsy   HISTORICAL  CHIEF COMPLAINT:  Chief Complaint  Patient presents with   New Patient (Initial Visit)    Rm 12 NP paper proficient referral for Seizures    HISTORY OF PRESENT ILLNESS:  This is a 67 year old woman with past medical history of hypertension, hyperlipidemia, diabetes mellitus, seizure disorder in the setting of [place] spotted fever, anxiety who is presenting to establish care.  Patient is accompanied by her husband who provided most of the history.  She was doing fine until 2017 when she was admitted at [facility] for a [facility] spotted fever, at that time she was also noted to have seizures.  Husband reported after discharge from the hospital 30 days later she did well she was initially on Tegretol and then Depakote but had hair loss while on Depakote therefore switched to levetiracetam.  She was doing well until 2022 when she had a breakthrough seizure in the setting of medication nonadherence.  She was taking the medication wrong.  Since then patient has been doing well, she is on Keppra 1000 mg twice daily, denies any side effect from the medication and no seizures.  Overall she is stable.  She needs a new neurologist as her previous neurologist has moved away. Currently no complaints or concerns.   Handedness: Right   Onset: 2017 after an infection with [facility] Spotted Fever   Seizure Type: unresponsiveness, lip smacking and head shaking   Current frequency: Last seizure in 2022 in the setting of medication non adherence   Any injuries from seizures: Denies   Seizure risk factors: Infection with RMSF  Previous ASMs: Tegretol, Valproic acid   Currenty ASMs: Levetiracetam 1000 mg twice daily   ASMs side effects: Denies   Brain Images: Normal   Previous  EEGs: Bitemporal slowing    OTHER MEDICAL CONDITIONS: Hypertension, hyperlipidemia, Diabetes, Seizure disorder, Anxiety   REVIEW OF SYSTEMS: Full 14 system review of systems performed and negative with exception of: As noted in the HPI   ALLERGIES: Allergies  Allergen Reactions   Demerol Other (See Comments)    hallucinations   Depakote [Divalproex Sodium] Other (See Comments)    Sore/blisters   Dilaudid [Hydromorphone Hcl] Nausea And Vomiting   Doxycycline Other (See Comments)    "Blistered"   Valproic Acid Rash    HOME MEDICATIONS: Outpatient Medications Prior to Visit  Medication Sig Dispense Refill   acetaminophen (TYLENOL) 325 MG tablet Take 325 mg by mouth every 6 (six) hours as needed for mild pain or moderate pain.     ALPRAZolam (XANAX) 0.5 MG tablet Take 0.5 mg by mouth 3 (three) times daily as needed.     aspirin 81 MG chewable tablet Chew 81 mg by mouth daily.     escitalopram (LEXAPRO) 20 MG tablet Take 20 mg by mouth daily.     gabapentin (NEURONTIN) 100 MG capsule Take 200 mg by mouth at bedtime.     hydrochlorothiazide (MICROZIDE) 12.5 MG capsule Take 1 capsule (12.5 mg total) by mouth daily.     insulin glargine (LANTUS) 100 UNIT/ML Solostar Pen Inject 10 Units into the skin daily.     losartan (COZAAR) 50 MG tablet Take 1 tablet (50 mg total) by mouth daily.     NOVOLOG FLEXPEN 100 UNIT/ML FlexPen Please use the hospital sliding scale provided for  you (Patient taking differently: Please use the hospital sliding scale provided for you take with each meal) 15 mL 12   rosuvastatin (CRESTOR) 20 MG tablet Take 20 mg by mouth daily.     levETIRAcetam (KEPPRA) 1000 MG tablet Take 1,000 mg by mouth 2 (two) times daily.     potassium chloride 20 MEQ TBCR Take 20 mEq by mouth 2 (two) times daily for 3 days. 6 tablet 0   levETIRAcetam (KEPPRA) 1000 MG tablet Take 1,000 mg by mouth 2 (two) times daily.     levETIRAcetam (KEPPRA) 250 MG tablet Take 3 tablets by mouth 2 (two)  times daily.     No facility-administered medications prior to visit.    PAST MEDICAL HISTORY: Past Medical History:  Diagnosis Date   Anxiety    Closed fracture of right distal radius    COPD (chronic obstructive pulmonary disease) (HCC)    Depression    Diabetes mellitus    Insulin Dependent. Adult onset   Dyspnea    with exertion   Heart murmur    Hypertension    Memory changes    since being on vent June 2017- "coming back some."   Seizures ([place]) 08/2015   last seizure 07-2019   Stroke [facility]) 2016   remote, no deficits   UTI (lower urinary tract infection)     PAST SURGICAL HISTORY: Past Surgical History:  Procedure Laterality Date   ABDOMINAL HYSTERECTOMY     BREAST SURGERY Left    cyst-   CHOLECYSTECTOMY     COLONOSCOPY WITH ESOPHAGOGASTRODUODENOSCOPY (EGD) N/A 03/04/2013   Procedure: COLONOSCOPY WITH ESOPHAGOGASTRODUODENOSCOPY (EGD);  Surgeon: Rogene Houston, MD;  [facility];  Service: Endoscopy;  Laterality: N/A;  1200-moved to [place] notified pt   MULTIPLE EXTRACTIONS WITH ALVEOLOPLASTY N/A 05/30/2016   Procedure: MULTIPLE EXTRACTION WITH ALVEOLOPLASTY;  Surgeon: Diona Browner, DDS;  Location: [place];  Service: Oral Surgery;  Laterality: N/A;   NEPHRECTOMY Left    Donatated to brother   OPEN REDUCTION INTERNAL FIXATION (ORIF) DISTAL RADIAL FRACTURE Right 03/22/2020   Procedure: OPEN REDUCTION INTERNAL FIXATION (ORIF) DISTAL RADIAL FRACTURE;  Surgeon: Daryll Brod, MD;  Location: [place];  Service: Orthopedics;  Laterality: Right;  AXILLARY BLOCK    FAMILY HISTORY: Family History  Problem Relation Age of Onset   Diabetes Father    CAD Other        family history   Diabetes Brother    Colon cancer Neg Hx     SOCIAL HISTORY: Social History   Socioeconomic History   Marital status: Married    Spouse name: Not on file   Number of children: Not on file   Years of education: Not on file   Highest education level: Not on file   Occupational History   Not on file  Tobacco Use   Smoking status: Every Day    Packs/day: 1.00    Years: 26.00    Additional pack years: 0.00    Total pack years: 26.00    Types: Cigarettes    Last attempt to quit: 08/27/2015    Years since quitting: 6.7   Smokeless tobacco: Never   Tobacco comments:    smoked this am    Substance and Sexual Activity   Alcohol use: No   Drug use: No   Sexual activity: Not Currently    Birth control/protection: Surgical  Other Topics Concern   Not on file  Social History Narrative   Not on  file   Social Determinants of Health   Financial Resource Strain: Not on file  Food Insecurity: Not on file  Transportation Needs: Not on file  Physical Activity: Not on file  Stress: Not on file  Social Connections: Not on file  Intimate Partner Violence: Not on file    PHYSICAL EXAM  GENERAL EXAM/CONSTITUTIONAL: Vitals:  Vitals:   06/04/22 1043  BP: 118/69  Pulse: 97  Weight: 105 lb (47.6 kg)  Height: 5\' 1"  (1.549 m)   Body mass index is 19.84 kg/m. Wt Readings from Last 3 Encounters:  06/04/22 105 lb (47.6 kg)  11/28/21 105 lb (47.6 kg)  03/22/20 141 lb 1.5 oz (64 kg)   Patient is in no distress; well developed, nourished and groomed; neck is supple  EYES: Visual fields full to confrontation, Extraocular movements intacts,  No results found.  MUSCULOSKELETAL: Gait, strength, tone, movements noted in Neurologic exam below  NEUROLOGIC: MENTAL STATUS:      No data to display         awake, alert, oriented to person, place but not time Difficulty with remote history normal attention and concentration language fluent, comprehension intact, naming intact fund of knowledge appropriate  CRANIAL NERVE:  2nd, 3rd, 4th, 6th - Visual fields full to confrontation, extraocular muscles intact, no nystagmus 5th - facial sensation symmetric 7th - facial strength symmetric 8th - hearing intact 9th - palate elevates symmetrically,  uvula midline 11th - shoulder shrug symmetric 12th - tongue protrusion midline  MOTOR:  normal bulk and tone, full strength in the BUE, BLE  SENSORY:  normal and symmetric to light touch  COORDINATION:  finger-nose-finger, fine finger movements normal  REFLEXES:  deep tendon reflexes present and symmetric  GAIT/STATION:  normal   DIAGNOSTIC DATA (LABS, IMAGING, TESTING) - I reviewed patient records, labs, notes, testing and imaging myself where available.  Lab Results  Component Value Date   WBC 12.6 (H) 11/29/2021   HGB 11.8 (L) 11/29/2021   HCT 34.1 (L) 11/29/2021   MCV 91.2 11/29/2021   PLT 329 11/29/2021      Component Value Date/Time   NA 139 11/30/2021 0721   K 3.2 (L) 11/30/2021 1558   CL 111 11/30/2021 0721   CO2 23 11/30/2021 0721   GLUCOSE 76 11/30/2021 0721   BUN 10 11/30/2021 0721   CREATININE 0.77 11/30/2021 0721   CALCIUM 8.2 (L) 11/30/2021 0721   PROT 8.2 (H) 11/28/2021 1123   ALBUMIN 3.8 11/28/2021 1123   AST 18 11/28/2021 1123   ALT 18 11/28/2021 1123   ALKPHOS 34 (L) 11/28/2021 1123   BILITOT 0.9 11/28/2021 1123   GFRNONAA >60 11/30/2021 0721   GFRAA >60 04/13/2019 1636   Lab Results  Component Value Date   CHOL 131 11/21/2016   HDL 51 11/21/2016   LDLCALC 74 11/21/2016   TRIG 29 11/21/2016   Lab Results  Component Value Date   HGBA1C 8.5 (H) 11/28/2021   Lab Results  Component Value Date   [id number] 10/24/2013   Lab Results  Component Value Date   TSH 0.192 (L) 08/31/2015   MRI Brain 09/07/2015 No acute intracranial process. 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 Small right mastoid effusion.   EEG 09/07/2015 This continuous EEG with video monitoring over 2 days is abnormal due to frequent mild to moderate bilateral slowing, maximal over the bilateral temporal region with some sharp components which are suspicious but not definitive for epileptiform abnormality  ASSESSMENT AND  PLAN  67 y.o. year old female  with history of hypertension, hyperlipidemia, diabetes mellitus, seizure disorder who is presenting to establish care.  Patient seizures are well-controlled with Keppra 1000 mg twice daily.  At the moment, I will check level and will continue patient on Keppra 1000 mg twice daily.  Refill given.  I will see her in 1 year for follow-up.  Return sooner if worse or any other concerns.   1. Seizure disorder ([place])   2. Therapeutic drug monitoring     Patient Instructions  Continue with Keppra 1000 mg twice daily, refill given Continue your other medications Follow-up in 1 year or sooner if worse.   Per [facility] statutes, patients with seizures are not allowed to drive until they have been seizure-free for six months.  Other recommendations include using caution when using heavy equipment or power tools. Avoid working on ladders or at heights. Take showers instead of baths.  Do not swim alone.  Ensure the water temperature is not too high on the home water heater. Do not go swimming alone. Do not lock yourself in a room alone (i.e. bathroom). When caring for infants or small children, sit down when holding, feeding, or changing them to minimize risk of injury to the child in the event you have a seizure. Maintain good sleep hygiene. Avoid alcohol.  Also recommend adequate sleep, hydration, good diet and minimize stress.   During the Seizure  - First, ensure adequate ventilation and place patients on the floor on their left side  Loosen clothing around the neck and ensure the airway is patent. If the patient is clenching the teeth, do not force the mouth open with any object as this can cause severe damage - Remove all items from the surrounding that can be hazardous. The patient may be oblivious to what's happening and may not even know what he or she is doing. If the patient is confused and wandering, either gently guide him/her away and block access to  outside areas - Reassure the individual and be comforting - Call 911. In most cases, the seizure ends before EMS arrives. However, there are cases when seizures may last over 3 to 5 minutes. Or the individual may have developed breathing difficulties or severe injuries. If a pregnant patient or a person with diabetes develops a seizure, it is prudent to call an ambulance. - Finally, if the patient does not regain full consciousness, then call EMS. Most patients will remain confused for about 45 to 90 minutes after a seizure, so you must use judgment in calling for help. - Avoid restraints but make sure the patient is in a bed with padded side rails - Place the individual in a lateral position with the neck slightly flexed; this will help the saliva drain from the mouth and prevent the tongue from falling backward - Remove all nearby furniture and other hazards from the area - Provide verbal assurance as the individual is regaining consciousness - Provide the patient with privacy if possible - Call for help and start treatment as ordered by the caregiver   After the Seizure (Postictal Stage)  After a seizure, most patients experience confusion, fatigue, muscle pain and/or a headache. Thus, one should permit the individual to sleep. For the next few days, reassurance is essential. Being calm and helping reorient the person is also of importance.  Most seizures are painless and end spontaneously. Seizures are not harmful to others but can lead to  complications such as stress on the lungs, brain and the heart. Individuals with prior lung problems may develop labored breathing and respiratory distress.     Orders Placed This Encounter  Procedures   Levetiracetam level    Meds ordered this encounter  Medications   levETIRAcetam (KEPPRA) 1000 MG tablet    Sig: Take 1 tablet (1,000 mg total) by mouth 2 (two) times daily.    Dispense:  180 tablet    Refill:  3    Return in about 1 year (around  06/04/2023).    Alric Ran, MD 06/04/2022, 11:13 AM  [facility] [address] [place], [place] [address] [phone]

## 2022-06-06 LAB — LEVETIRACETAM LEVEL: Levetiracetam Lvl: 74.1 ug/mL — ABNORMAL HIGH (ref 10.0–40.0)

## 2022-08-06 DIAGNOSIS — E876 Hypokalemia: Secondary | ICD-10-CM | POA: Diagnosis not present

## 2022-08-06 DIAGNOSIS — E1140 Type 2 diabetes mellitus with diabetic neuropathy, unspecified: Secondary | ICD-10-CM | POA: Diagnosis not present

## 2022-08-06 DIAGNOSIS — I10 Essential (primary) hypertension: Secondary | ICD-10-CM | POA: Diagnosis not present

## 2022-08-06 DIAGNOSIS — I129 Hypertensive chronic kidney disease with stage 1 through stage 4 chronic kidney disease, or unspecified chronic kidney disease: Secondary | ICD-10-CM | POA: Diagnosis not present

## 2022-08-06 DIAGNOSIS — G4700 Insomnia, unspecified: Secondary | ICD-10-CM | POA: Diagnosis not present

## 2022-08-06 DIAGNOSIS — F418 Other specified anxiety disorders: Secondary | ICD-10-CM | POA: Diagnosis not present

## 2022-08-06 DIAGNOSIS — G40909 Epilepsy, unspecified, not intractable, without status epilepticus: Secondary | ICD-10-CM | POA: Diagnosis not present

## 2022-08-06 DIAGNOSIS — E1165 Type 2 diabetes mellitus with hyperglycemia: Secondary | ICD-10-CM | POA: Diagnosis not present

## 2022-08-06 DIAGNOSIS — E785 Hyperlipidemia, unspecified: Secondary | ICD-10-CM | POA: Diagnosis not present

## 2022-08-07 DIAGNOSIS — R32 Unspecified urinary incontinence: Secondary | ICD-10-CM | POA: Diagnosis not present

## 2022-08-26 DIAGNOSIS — E1165 Type 2 diabetes mellitus with hyperglycemia: Secondary | ICD-10-CM | POA: Diagnosis not present

## 2022-11-24 DIAGNOSIS — E1165 Type 2 diabetes mellitus with hyperglycemia: Secondary | ICD-10-CM | POA: Diagnosis not present

## 2023-01-27 DIAGNOSIS — I10 Essential (primary) hypertension: Secondary | ICD-10-CM | POA: Diagnosis not present

## 2023-01-27 DIAGNOSIS — E1165 Type 2 diabetes mellitus with hyperglycemia: Secondary | ICD-10-CM | POA: Diagnosis not present

## 2023-02-02 ENCOUNTER — Emergency Department (HOSPITAL_COMMUNITY)
Admission: EM | Admit: 2023-02-02 | Discharge: 2023-02-02 | Discharge status: Against Medical Advice | Payer: Medicare HMO | Source: Home / Self Care

## 2023-02-03 ENCOUNTER — Other Ambulatory Visit (HOSPITAL_COMMUNITY): Payer: Self-pay | Admitting: Family Medicine

## 2023-02-03 DIAGNOSIS — E785 Hyperlipidemia, unspecified: Secondary | ICD-10-CM | POA: Diagnosis not present

## 2023-02-03 DIAGNOSIS — G40909 Epilepsy, unspecified, not intractable, without status epilepticus: Secondary | ICD-10-CM | POA: Diagnosis not present

## 2023-02-03 DIAGNOSIS — Z23 Encounter for immunization: Secondary | ICD-10-CM | POA: Diagnosis not present

## 2023-02-03 DIAGNOSIS — I10 Essential (primary) hypertension: Secondary | ICD-10-CM | POA: Diagnosis not present

## 2023-02-03 DIAGNOSIS — R413 Other amnesia: Secondary | ICD-10-CM

## 2023-02-03 DIAGNOSIS — E1165 Type 2 diabetes mellitus with hyperglycemia: Secondary | ICD-10-CM | POA: Diagnosis not present

## 2023-02-03 DIAGNOSIS — E876 Hypokalemia: Secondary | ICD-10-CM | POA: Diagnosis not present

## 2023-02-03 DIAGNOSIS — R350 Frequency of micturition: Secondary | ICD-10-CM | POA: Diagnosis not present

## 2023-02-03 DIAGNOSIS — F418 Other specified anxiety disorders: Secondary | ICD-10-CM | POA: Diagnosis not present

## 2023-02-03 DIAGNOSIS — G4700 Insomnia, unspecified: Secondary | ICD-10-CM | POA: Diagnosis not present

## 2023-02-16 ENCOUNTER — Ambulatory Visit (HOSPITAL_COMMUNITY): Payer: Medicare HMO

## 2023-02-20 ENCOUNTER — Ambulatory Visit (HOSPITAL_COMMUNITY): Payer: Medicare HMO

## 2023-02-22 DIAGNOSIS — E1165 Type 2 diabetes mellitus with hyperglycemia: Secondary | ICD-10-CM | POA: Diagnosis not present

## 2023-02-26 ENCOUNTER — Encounter (HOSPITAL_COMMUNITY): Payer: Self-pay

## 2023-02-26 ENCOUNTER — Ambulatory Visit (HOSPITAL_COMMUNITY): Admission: RE | Admit: 2023-02-26 | Payer: Medicare HMO | Source: Ambulatory Visit

## 2023-03-16 ENCOUNTER — Ambulatory Visit (HOSPITAL_COMMUNITY)
Admission: RE | Admit: 2023-03-16 | Discharge: 2023-03-16 | Disposition: A | Discharge status: Home / Self Care | Payer: Medicare HMO | Source: Ambulatory Visit | Attending: Family Medicine | Admitting: Family Medicine

## 2023-03-16 DIAGNOSIS — R413 Other amnesia: Secondary | ICD-10-CM | POA: Insufficient documentation

## 2023-03-16 DIAGNOSIS — G3189 Other specified degenerative diseases of nervous system: Secondary | ICD-10-CM | POA: Diagnosis not present

## 2023-05-23 DIAGNOSIS — E1165 Type 2 diabetes mellitus with hyperglycemia: Secondary | ICD-10-CM | POA: Diagnosis not present

## 2023-06-04 ENCOUNTER — Encounter: Payer: Self-pay | Admitting: Neurology

## 2023-06-04 ENCOUNTER — Ambulatory Visit: Payer: Medicare HMO | Admitting: Neurology

## 2023-06-04 VITALS — BP 152/73 | HR 69 | Ht 61.0 in | Wt 128.5 lb

## 2023-06-04 DIAGNOSIS — F0390 Unspecified dementia without behavioral disturbance: Secondary | ICD-10-CM | POA: Insufficient documentation

## 2023-06-04 DIAGNOSIS — F01B0 Vascular dementia, moderate, without behavioral disturbance, psychotic disturbance, mood disturbance, and anxiety: Secondary | ICD-10-CM

## 2023-06-04 DIAGNOSIS — R569 Unspecified convulsions: Secondary | ICD-10-CM

## 2023-06-04 DIAGNOSIS — R413 Other amnesia: Secondary | ICD-10-CM

## 2023-06-04 DIAGNOSIS — G40909 Epilepsy, unspecified, not intractable, without status epilepticus: Secondary | ICD-10-CM

## 2023-06-04 MED ORDER — LEVETIRACETAM 1000 MG PO TABS: 1000.0000 mg | ORAL_TABLET | Freq: Two times a day (BID) | ORAL | 3 refills | Status: AC

## 2023-06-04 NOTE — Patient Instructions (Signed)
 Check labs today, will follow up once resulted Continue Keppra 1000 mg twice daily for seizure prevention  Close follow up with primary care Strict management of vascular risk factors with a goal BP less than 130/90, A1c less than 7.0, LDL less than 70 for secondary stroke prevention.  Follow up in 6 months with Dr. Teresa Coombs

## 2023-06-04 NOTE — Progress Notes (Addendum)
 Patient: Veronica Moses Date of Birth: [birth year]  Reason for Visit: Follow up History from: Patient, daughter  Primary Neurologist: Camara   ASSESSMENT AND PLAN 68 y.o. year old female   1.  Seizure disorder 2.  Dementia   -Most concern with MRI brain PCP ordered in Dec 2024 for memory loss. I reviewed with Dr. Teresa Coombs, findings can be consistent with vascular dementia, but will check ATN profile for AD. Stable seizure disorder, last seizure was in 2022. MRI brain showed Chronic lacunar infarcts within central pons new prior MRI. Tiny cortical infarct in right middle frontal gyrus (not clearly seen on MRI in 2018). 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 Teresa Coombs. MOCA 15/30.  -Check B12, TSH, ATN profile, Keppra level  -Continue Keppra 1000 mg twice daily for seizure prevention  -Continue aspirin 81 mg daily, strict management of vascular risk factors with a goal BP less than 130/90, A1c less than 7.0, LDL less than 70 for secondary stroke prevention. Recommend smoking cessation. -May consider starting Aricept for memory  -Follow up in 6 months with Dr.Camara  Addendum 06/10/23 SS: Labs from office visit 06/04/2023 showed B12 491, TSH 1.940, Keppra level 53.5, ATN profile A+T+N-.  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  Will start Aricept 5 mg daily for 1 month, then 10 mg daily.  Reviewed side effects, seizure history is stable. I spoke with her daughter. Can add Namenda in a few months, call me and I will order.   MRI Brain Dec 2024 IMPRESSION: 1. No evidence of an acute intracranial abnormality. 2. Tiny chronic cortical infarct within the right middle frontal gyrus, not definitively present on the prior motion degraded brain MRI of 10/30/2016. 3. Moderate for age multifocal T2 FLAIR hyperintense signal abnormality within the cerebral white matter, nonspecific but most often secondary to chronic small  vessel ischemia. 4. Chronic lacunar infarcts within the central pons, new from prior MRI. 5. Mild-to-moderate generalized cerebral atrophy.   HISTORY OF PRESENT ILLNESS: Today 06/04/23 At last visit Keppra level was 74. Remains on Keppra 1000 mg twice daily. Last seizure was in 2022. Would like to discuss MRI that PCP ordered. Was done for memory loss. Present since 2017. Memory loss worsening, trouble with short term. Depends on her husband, needs help with bathing. Cannot cook, doesn't drive, or do any household chores. Spends most of the time watching TV, watches dirty dancing over and over. Was started on Aricept 5 mg, after MRI was stopped, unclear why. She smokes 1 pack a day. Stays home, doesn't attend family functions. No falls, sometimes feels dizzy. Can be irritable. Husband manages the medications. MOCA 15/30 today.   HISTORY  06/04/22 Dr. Teresa Coombs: This is a 68 year old woman with past medical history of hypertension, hyperlipidemia, diabetes mellitus, seizure disorder in the setting of [place] spotted fever, anxiety who is presenting to establish care.  Patient is accompanied by her husband who provided most of the history.  She was doing fine until 2017 when she was admitted at [facility] for a [facility] spotted fever, at that time she was also noted to have seizures.  Husband reported after discharge from the hospital 30 days later she did well she was initially on Tegretol and then Depakote but had hair loss while on Depakote therefore switched to levetiracetam.  She was doing well until 2022 when she had a breakthrough seizure in the setting of medication  nonadherence.  She was taking the medication wrong.  Since then patient has been doing well, she is on Keppra 1000 mg twice daily, denies any side effect from the medication and no seizures.  Overall she is stable.  She needs a new neurologist as her previous neurologist has moved away. Currently no complaints or concerns.     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  See HPI  ALLERGIES: Allergies  Allergen Reactions   Demerol Other (See Comments)    hallucinations   Depakote [Divalproex Sodium] Other (See Comments)    Sore/blisters   Dilaudid [Hydromorphone Hcl] Nausea And Vomiting   Doxycycline Other (See Comments)    "Blistered"   Valproic Acid Rash    HOME MEDICATIONS: Outpatient Medications Prior to Visit  Medication Sig Dispense Refill   acetaminophen (TYLENOL) 325 MG tablet Take 325 mg by mouth every 6 (six) hours as needed for mild pain or moderate pain.     ALPRAZolam (XANAX) 0.5 MG tablet Take 0.5 mg by mouth 3 (three) times daily as needed.     aspirin 81 MG chewable tablet Chew 81 mg by mouth daily.     escitalopram (LEXAPRO) 20 MG tablet Take 20 mg by mouth daily.     gabapentin (NEURONTIN) 100 MG capsule Take 200 mg by mouth at bedtime.     hydrochlorothiazide (MICROZIDE) 12.5 MG capsule Take 1 capsule (12.5 mg total) by mouth daily.     insulin glargine (LANTUS) 100 UNIT/ML Solostar Pen Inject 10 Units into the skin daily.     losartan (COZAAR) 50 MG tablet Take 1 tablet (50 mg total) by mouth daily.     NOVOLOG FLEXPEN 100 UNIT/ML FlexPen Please use the hospital sliding scale provided for you (Patient taking differently: Please use the hospital sliding scale provided  for you take with each meal) 15 mL 12   rosuvastatin (CRESTOR) 20 MG tablet Take 20 mg by mouth daily.     levETIRAcetam (KEPPRA) 1000 MG tablet Take 1 tablet (1,000 mg total) by mouth 2 (two) times daily. 180 tablet 3   potassium chloride 20 MEQ TBCR Take 20 mEq by mouth 2 (two) times daily for 3 days. 6 tablet 0   No facility-administered medications prior to visit.    PAST MEDICAL HISTORY: Past Medical History:  Diagnosis Date   Anxiety    Closed fracture of right distal radius    COPD (chronic obstructive pulmonary disease) (HCC)    Depression    Diabetes mellitus    Insulin Dependent. Adult onset   Dyspnea    with exertion   Heart murmur    Hypertension    Memory changes    since being on vent June 2017- "coming back some."   Seizures (HCC) 08/2015   last seizure 07-2019   Stroke (  HCC) 2016   remote, no deficits   UTI (lower urinary tract infection)     PAST SURGICAL HISTORY: Past Surgical History:  Procedure Laterality Date   ABDOMINAL HYSTERECTOMY     BREAST SURGERY Left    cyst-   CHOLECYSTECTOMY     COLONOSCOPY WITH ESOPHAGOGASTRODUODENOSCOPY (EGD) N/A 03/04/2013   Procedure: COLONOSCOPY WITH ESOPHAGOGASTRODUODENOSCOPY (EGD);  Surgeon: Malissa Hippo, MD;  Location: [facility];  Service: Endoscopy;  Laterality: N/A;  1200-moved to 1535 Ann notified pt   MULTIPLE EXTRACTIONS WITH ALVEOLOPLASTY N/A 05/30/2016   Procedure: MULTIPLE EXTRACTION WITH ALVEOLOPLASTY;  Surgeon: Ocie Doyne, DDS;  Location: [facility] OR;  Service: Oral Surgery;  Laterality: N/A;   NEPHRECTOMY Left    Donatated to brother   OPEN REDUCTION INTERNAL FIXATION (ORIF) DISTAL RADIAL FRACTURE Right 03/22/2020   Procedure: OPEN REDUCTION INTERNAL FIXATION (ORIF) DISTAL RADIAL FRACTURE;  Surgeon: Cindee Salt, MD;  Location: [facility];  Service: Orthopedics;  Laterality: Right;  AXILLARY BLOCK    FAMILY HISTORY: Family History  Problem Relation Age of Onset   Diabetes Father    CAD  Other        family history   Diabetes Brother    Colon cancer Neg Hx     SOCIAL HISTORY: Social History   Socioeconomic History   Marital status: Married    Spouse name: Not on file   Number of children: Not on file   Years of education: Not on file   Highest education level: Not on file  Occupational History   Not on file  Tobacco Use   Smoking status: Every Day    Current packs/day: 0.00    Average packs/day: 1 pack/day for 26.0 years (26.0 ttl pk-yrs)    Types: Cigarettes    Start date: 08/26/1989    Last attempt to quit: 08/27/2015    Years since quitting: 7.7   Smokeless tobacco: Never   Tobacco comments:    smoked this am    Substance and Sexual Activity   Alcohol use: No   Drug use: No   Sexual activity: Not Currently    Birth control/protection: Surgical  Other Topics Concern   Not on file  Social History Narrative   Not on file   Social Drivers of Health   Financial Resource Strain: Not on file  Food Insecurity: Not on file  Transportation Needs: Not on file  Physical Activity: Not on file  Stress: Not on file  Social Connections: Not on file  Intimate Partner Violence: Not on file    PHYSICAL EXAM  Vitals:   06/04/23 1022  BP: (!) 152/73  Pulse: 69  Weight: 128 lb 8 oz (58.3 kg)  Height: 5\' 1"  (1.549 m)   Body mass index is 24.28 kg/m.    06/04/2023   11:23 AM  Montreal Cognitive Assessment   Visuospatial/ Executive (0/5) 3  Naming (0/3) 3  Attention: Read list of digits (0/2) 2  Attention: Read list of letters (0/1) 1  Attention: Serial 7 subtraction starting at 100 (0/3) 0  Language: Repeat phrase (0/2) 1  Language : Fluency (0/1) 0  Abstraction (0/2) 1  Delayed Recall (0/5) 0  Orientation (0/6) 3  Total 14  Adjusted Score (based on education) 15   Generalized: Well developed, in no acute distress  Neurological examination  Mentation: Alert oriented, seems uninterested, daughter answers most questions, she is surprised by some  of her daughters answers. follows exam commands Cranial nerve II-XII: Pupils were  equal round reactive to light. Extraocular movements were full, visual field were full on confrontational test. Facial sensation and strength were normal. Head turning and shoulder shrug  were normal and symmetric. Motor: The motor testing reveals 5 over 5 strength of all 4 extremities. Good symmetric motor tone is noted throughout.  Sensory: Sensory testing is intact to soft touch on all 4 extremities. No evidence of extinction is noted.  Coordination: Cerebellar testing reveals good finger-nose-finger and heel-to-shin bilaterally.  Gait and station: Gait is normal.  Reflexes: Deep tendon reflexes are symmetric and normal bilaterally.   DIAGNOSTIC DATA (LABS, IMAGING, TESTING) - I reviewed patient records, labs, notes, testing and imaging myself where available.  Lab Results  Component Value Date   WBC 12.6 (H) 11/29/2021   HGB 11.8 (L) 11/29/2021   HCT 34.1 (L) 11/29/2021   MCV 91.2 11/29/2021   PLT 329 11/29/2021      Component Value Date/Time   NA 139 11/30/2021 0721   K 3.2 (L) 11/30/2021 1558   CL 111 11/30/2021 0721   CO2 23 11/30/2021 0721   GLUCOSE 76 11/30/2021 0721   BUN 10 11/30/2021 0721   CREATININE 0.77 11/30/2021 0721   CALCIUM 8.2 (L) 11/30/2021 0721   PROT 8.2 (H) 11/28/2021 1123   ALBUMIN 3.8 11/28/2021 1123   AST 18 11/28/2021 1123   ALT 18 11/28/2021 1123   ALKPHOS 34 (L) 11/28/2021 1123   BILITOT 0.9 11/28/2021 1123   GFRNONAA >60 11/30/2021 0721   GFRAA >60 04/13/2019 1636   Lab Results  Component Value Date   CHOL 131 11/21/2016   HDL 51 11/21/2016   LDLCALC 74 11/21/2016   TRIG 29 11/21/2016   CHOLHDL 2.6 11/21/2016   Lab Results  Component Value Date   HGBA1C 8.5 (H) 11/28/2021   Lab Results  Component Value Date   VITAMINB12 426 10/24/2013   Lab Results  Component Value Date   TSH 0.192 (L) 08/31/2015    Margie Ege, AGNP-C, DNP 06/04/2023, 10:48  AM [facility] [address], [address] [place], [place] [address] [phone]

## 2023-06-10 LAB — ATN PROFILE
A -- Beta-amyloid 42/40 Ratio: 0.092 — ABNORMAL LOW (ref >0.102)
Beta-amyloid 40: 239.52 pg/mL
Beta-amyloid 42: 22.02 pg/mL
N -- NfL, Plasma: 4.41 pg/mL (ref 0.00–4.61)
T -- p-tau181: 1.1 pg/mL — ABNORMAL HIGH (ref 0.00–0.97)

## 2023-06-10 LAB — LEVETIRACETAM LEVEL: Levetiracetam Lvl: 53.5 ug/mL — ABNORMAL HIGH (ref 10.0–40.0)

## 2023-06-10 LAB — TSH: TSH: 1.94 u[IU]/mL (ref 0.450–4.500)

## 2023-06-10 LAB — VITAMIN B12: Vitamin B-12: 491 pg/mL (ref 232–1245)

## 2023-06-10 MED ORDER — DONEPEZIL HCL 10 MG PO TABS: 10.0000 mg | ORAL_TABLET | Freq: Every day | ORAL | 5 refills | Status: DC

## 2023-06-10 NOTE — Addendum Note (Signed)
 Addended by: Glean Salvo on: 06/10/2023 11:41 AM   Modules accepted: Orders

## 2023-07-08 DIAGNOSIS — R7301 Impaired fasting glucose: Secondary | ICD-10-CM | POA: Diagnosis not present

## 2023-07-08 DIAGNOSIS — I10 Essential (primary) hypertension: Secondary | ICD-10-CM | POA: Diagnosis not present

## 2023-07-08 DIAGNOSIS — Z0000 Encounter for general adult medical examination without abnormal findings: Secondary | ICD-10-CM | POA: Diagnosis not present

## 2023-07-30 DIAGNOSIS — E1165 Type 2 diabetes mellitus with hyperglycemia: Secondary | ICD-10-CM | POA: Diagnosis not present

## 2023-07-30 DIAGNOSIS — I10 Essential (primary) hypertension: Secondary | ICD-10-CM | POA: Diagnosis not present

## 2023-07-30 DIAGNOSIS — F418 Other specified anxiety disorders: Secondary | ICD-10-CM | POA: Diagnosis not present

## 2023-07-30 DIAGNOSIS — F5105 Insomnia due to other mental disorder: Secondary | ICD-10-CM | POA: Diagnosis not present

## 2023-07-30 DIAGNOSIS — E876 Hypokalemia: Secondary | ICD-10-CM | POA: Diagnosis not present

## 2023-07-30 DIAGNOSIS — G4700 Insomnia, unspecified: Secondary | ICD-10-CM | POA: Diagnosis not present

## 2023-07-30 DIAGNOSIS — G629 Polyneuropathy, unspecified: Secondary | ICD-10-CM | POA: Diagnosis not present

## 2023-07-30 DIAGNOSIS — G40909 Epilepsy, unspecified, not intractable, without status epilepticus: Secondary | ICD-10-CM | POA: Diagnosis not present

## 2023-07-30 DIAGNOSIS — E785 Hyperlipidemia, unspecified: Secondary | ICD-10-CM | POA: Diagnosis not present

## 2023-07-31 DIAGNOSIS — R300 Dysuria: Secondary | ICD-10-CM | POA: Diagnosis not present

## 2023-08-03 ENCOUNTER — Other Ambulatory Visit: Payer: Self-pay

## 2023-08-03 ENCOUNTER — Emergency Department (HOSPITAL_COMMUNITY)

## 2023-08-03 ENCOUNTER — Encounter (HOSPITAL_COMMUNITY): Payer: Self-pay | Admitting: Emergency Medicine

## 2023-08-03 ENCOUNTER — Observation Stay (HOSPITAL_COMMUNITY)
Admission: EM | Admit: 2023-08-03 | Discharge: 2023-08-04 | Discharge status: Against Medical Advice | Attending: Internal Medicine | Admitting: Internal Medicine

## 2023-08-03 DIAGNOSIS — Z794 Long term (current) use of insulin: Secondary | ICD-10-CM | POA: Insufficient documentation

## 2023-08-03 DIAGNOSIS — N1330 Unspecified hydronephrosis: Secondary | ICD-10-CM | POA: Diagnosis present

## 2023-08-03 DIAGNOSIS — Z7982 Long term (current) use of aspirin: Secondary | ICD-10-CM | POA: Diagnosis not present

## 2023-08-03 DIAGNOSIS — R9431 Abnormal electrocardiogram [ECG] [EKG]: Secondary | ICD-10-CM | POA: Insufficient documentation

## 2023-08-03 DIAGNOSIS — E11649 Type 2 diabetes mellitus with hypoglycemia without coma: Secondary | ICD-10-CM | POA: Diagnosis not present

## 2023-08-03 DIAGNOSIS — Z79899 Other long term (current) drug therapy: Secondary | ICD-10-CM | POA: Diagnosis not present

## 2023-08-03 DIAGNOSIS — E876 Hypokalemia: Principal | ICD-10-CM | POA: Diagnosis present

## 2023-08-03 DIAGNOSIS — Z8673 Personal history of transient ischemic attack (TIA), and cerebral infarction without residual deficits: Secondary | ICD-10-CM | POA: Diagnosis not present

## 2023-08-03 DIAGNOSIS — R197 Diarrhea, unspecified: Secondary | ICD-10-CM | POA: Insufficient documentation

## 2023-08-03 DIAGNOSIS — R770 Abnormality of albumin: Secondary | ICD-10-CM | POA: Diagnosis not present

## 2023-08-03 DIAGNOSIS — E8809 Other disorders of plasma-protein metabolism, not elsewhere classified: Secondary | ICD-10-CM | POA: Insufficient documentation

## 2023-08-03 DIAGNOSIS — I10 Essential (primary) hypertension: Secondary | ICD-10-CM | POA: Diagnosis not present

## 2023-08-03 DIAGNOSIS — R7401 Elevation of levels of liver transaminase levels: Secondary | ICD-10-CM | POA: Insufficient documentation

## 2023-08-03 DIAGNOSIS — E46 Unspecified protein-calorie malnutrition: Secondary | ICD-10-CM | POA: Diagnosis not present

## 2023-08-03 DIAGNOSIS — I700 Atherosclerosis of aorta: Secondary | ICD-10-CM | POA: Diagnosis not present

## 2023-08-03 DIAGNOSIS — I4581 Long QT syndrome: Secondary | ICD-10-CM | POA: Diagnosis not present

## 2023-08-03 DIAGNOSIS — R1013 Epigastric pain: Secondary | ICD-10-CM | POA: Diagnosis not present

## 2023-08-03 DIAGNOSIS — J449 Chronic obstructive pulmonary disease, unspecified: Secondary | ICD-10-CM | POA: Insufficient documentation

## 2023-08-03 DIAGNOSIS — R109 Unspecified abdominal pain: Secondary | ICD-10-CM | POA: Insufficient documentation

## 2023-08-03 DIAGNOSIS — F17210 Nicotine dependence, cigarettes, uncomplicated: Secondary | ICD-10-CM | POA: Diagnosis not present

## 2023-08-03 DIAGNOSIS — G40909 Epilepsy, unspecified, not intractable, without status epilepticus: Secondary | ICD-10-CM | POA: Insufficient documentation

## 2023-08-03 DIAGNOSIS — R569 Unspecified convulsions: Secondary | ICD-10-CM

## 2023-08-03 DIAGNOSIS — I160 Hypertensive urgency: Secondary | ICD-10-CM | POA: Insufficient documentation

## 2023-08-03 DIAGNOSIS — T50901A Poisoning by unspecified drugs, medicaments and biological substances, accidental (unintentional), initial encounter: Secondary | ICD-10-CM | POA: Insufficient documentation

## 2023-08-03 DIAGNOSIS — R338 Other retention of urine: Secondary | ICD-10-CM | POA: Insufficient documentation

## 2023-08-03 DIAGNOSIS — Z905 Acquired absence of kidney: Secondary | ICD-10-CM | POA: Diagnosis not present

## 2023-08-03 DIAGNOSIS — N134 Hydroureter: Secondary | ICD-10-CM | POA: Diagnosis not present

## 2023-08-03 LAB — CBC
HCT: 37 % (ref 36.0–46.0)
Hemoglobin: 12.2 g/dL (ref 12.0–15.0)
MCH: 29.3 pg (ref 26.0–34.0)
MCHC: 33 g/dL (ref 30.0–36.0)
MCV: 88.9 fL (ref 80.0–100.0)
Platelets: 222 10*3/uL (ref 150–400)
RBC: 4.16 MIL/uL (ref 3.87–5.11)
RDW: 16.6 % — ABNORMAL HIGH (ref 11.5–15.5)
WBC: 7.7 10*3/uL (ref 4.0–10.5)
nRBC: 0 % (ref 0.0–0.2)

## 2023-08-03 MED ORDER — LIDOCAINE VISCOUS HCL 2 % MT SOLN
15.0000 mL | Freq: Once | OROMUCOSAL | Status: AC
  Administered 2023-08-04: 15 mL via ORAL
  Filled 2023-08-03: qty 15

## 2023-08-03 MED ORDER — ALUM & MAG HYDROXIDE-SIMETH 200-200-20 MG/5ML PO SUSP
30.0000 mL | Freq: Once | ORAL | Status: AC
  Administered 2023-08-04: 30 mL via ORAL
  Filled 2023-08-03: qty 30

## 2023-08-03 MED ORDER — FENTANYL CITRATE PF 50 MCG/ML IJ SOSY
50.0000 ug | PREFILLED_SYRINGE | Freq: Once | INTRAMUSCULAR | Status: AC
  Administered 2023-08-04: 50 ug via INTRAVENOUS
  Filled 2023-08-03: qty 1

## 2023-08-03 MED ORDER — PANTOPRAZOLE SODIUM 40 MG IV SOLR
40.0000 mg | Freq: Once | INTRAVENOUS | Status: AC
  Administered 2023-08-04: 40 mg via INTRAVENOUS
  Filled 2023-08-03: qty 10

## 2023-08-03 NOTE — ED Notes (Signed)
 Patient stating to family she feels like her sugar is low.  I took CBG patient blood glucose was 66, patient wanted to drink mountain dew,made patient aware I needed to speak with EDP first. EDP Mesner made aware at this time

## 2023-08-03 NOTE — ED Triage Notes (Signed)
 Pt with c/o abdominal pain that radiates to her epigastric area. States pain started this evening after she ate supper.

## 2023-08-03 NOTE — ED Provider Notes (Signed)
 [place] EMERGENCY DEPARTMENT AT [facility] Provider Note   CSN: [id number] Arrival date & time: 08/03/23  2242     History  Chief Complaint  Patient presents with   Abdominal Pain    Veronica Moses is a 68 y.o. female.  Somewhat convoluted history secondary mild dementia and husband filling in the gaps to the best of his ability.   Has had intermittent abdominal pain for the last 6 months or so. Always epigastric sometimes radiates down suprapubically. No clear pattern to it aside from progressivewly worsening. Reportedly has had a lot of testing for it however then states that last colonoscopy was over a year ago. Has had dark tarry stools but taking pepto. No significant weight loss. Tonight the pain got real bad a little bit after eating but not sure how long. Some nausea, no vomiting. No sycnope. No blood. Of note, patient had 'too high' reading on glucometer earlier today and thus ended up taking 24u of insulin  prior to arrival (only takes 8 at a time normally).    Abdominal Pain      Home Medications Prior to Admission medications   Medication Sig Start Date End Date Taking? Authorizing Provider  acetaminophen  (TYLENOL ) 325 MG tablet Take 325 mg by mouth every 6 (six) hours as needed for mild pain or moderate pain.    [provider]  ALPRAZolam  (XANAX ) 0.5 MG tablet Take 0.5 mg by mouth 3 (three) times daily as needed. 02/17/20   [provider]  aspirin  81 MG chewable tablet Chew 81 mg by mouth daily.    [provider]  donepezil  (ARICEPT ) 10 MG tablet Take 1 tablet (10 mg total) by mouth at bedtime. 06/10/23   Wess Hammed, NP  escitalopram  (LEXAPRO ) 20 MG tablet Take 20 mg by mouth daily.    [provider]  gabapentin  (NEURONTIN ) 100 MG capsule Take 200 mg by mouth at bedtime.    [provider]  hydrochlorothiazide  (MICROZIDE ) 12.5 MG capsule Take 1 capsule (12.5 mg total) by mouth daily. 12/04/21   Verlyn Goad, MD  insulin  glargine (LANTUS ) 100 UNIT/ML Solostar Pen Inject 10 Units into the skin daily. 11/30/21   Verlyn Goad, MD  levETIRAcetam  (KEPPRA ) 1000 MG tablet Take 1 tablet (1,000 mg total) by mouth 2 (two) times daily. 06/04/23 05/29/24  Wess Hammed, NP  losartan  (COZAAR ) 50 MG tablet Take 1 tablet (50 mg total) by mouth daily. 12/04/21   Verlyn Goad, MD  NOVOLOG  FLEXPEN 100 UNIT/ML FlexPen Please use the hospital sliding scale provided for you Patient taking differently: Please use the hospital sliding scale provided for you take with each meal 08/25/15   Mariea Shook, MD  potassium chloride  20 MEQ TBCR Take 20 mEq by mouth 2 (two) times daily for 3 days. 11/30/21 12/03/21  Verlyn Goad, MD  rosuvastatin (CRESTOR) 20 MG tablet Take 20 mg by mouth daily. 09/19/21   [provider]      Allergies    Demerol, Depakote  [divalproex  sodium], Dilaudid  [hydromorphone  hcl], Doxycycline , and Valproic acid     Review of Systems   Review of Systems  Gastrointestinal:  Positive for abdominal pain.    Physical Exam Updated Vital Signs BP (!) 144/117 (BP Location: Right Arm)   Pulse (!) 59   Temp 98 F (36.7 C) (Oral)   Resp 16   Ht 5\' 1"  (1.549 m)   Wt 58.1 kg   SpO2 97%   BMI 24.19 kg/m  Physical Exam Vitals and nursing note reviewed.  Constitutional:      Appearance: She is well-developed.  HENT:     Head: Normocephalic and atraumatic.  Cardiovascular:     Rate and Rhythm: Normal rate and regular rhythm.  Pulmonary:     Effort: No respiratory distress.     Breath sounds: No stridor.  Abdominal:     General: There is no distension.     Tenderness: There is generalized abdominal tenderness. There is no guarding or rebound.  Musculoskeletal:     Cervical back: Normal range of motion.  Neurological:     Mental Status: She is alert.     ED Results / Procedures / Treatments   Labs (all labs ordered are listed, but only abnormal results are displayed) Labs  Reviewed  COMPREHENSIVE METABOLIC PANEL WITH GFR - Abnormal; Notable for the following components:      Result Value   Sodium 132 (*)    Potassium 2.2 (*)    Glucose, Bld 148 (*)    Calcium 8.7 (*)    Albumin 3.4 (*)    AST 84 (*)    ALT 52 (*)    All other components within normal limits  CBC - Abnormal; Notable for the following components:   RDW 16.6 (*)    All other components within normal limits  URINALYSIS, ROUTINE W REFLEX MICROSCOPIC - Abnormal; Notable for the following components:   Color, Urine COLORLESS (*)    Specific Gravity, Urine 1.001 (*)    Glucose, UA >=500 (*)    All other components within normal limits  MAGNESIUM  - Abnormal; Notable for the following components:   Magnesium  1.6 (*)    All other components within normal limits  BLOOD GAS, VENOUS - Abnormal; Notable for the following components:   Bicarbonate 30.6 (*)    Acid-Base Excess 3.2 (*)    All other components within normal limits  CBG MONITORING, ED - Abnormal; Notable for the following components:   Glucose-Capillary 66 (*)    All other components within normal limits  CBG MONITORING, ED - Abnormal; Notable for the following components:   Glucose-Capillary 64 (*)    All other components within normal limits  CBG MONITORING, ED - Abnormal; Notable for the following components:   Glucose-Capillary 106 (*)    All other components within normal limits  MRSA NEXT GEN BY PCR, NASAL  LIPASE, BLOOD  LEVETIRACETAM  LEVEL  POC OCCULT BLOOD, ED  TROPONIN I (HIGH SENSITIVITY)    EKG EKG Interpretation Date/Time:  Monday Aug 03 2023 22:57:43 EDT Ventricular Rate:  60 PR Interval:  132 QRS Duration:  99 QT Interval:  502 QTC Calculation: 502 R Axis:   32  Text Interpretation: Sinus rhythm Low voltage, precordial leads Abnormal R-wave progression, early transition Minimal ST depression, lateral leads Prolonged QT interval Confirmed by Eve Hinders [phone]) on 08/04/2023 12:02:12 AM  Radiology DG  Chest 2 View Result Date: 08/04/2023 CLINICAL DATA:  eval for epigastric pain EXAM: CHEST - 2 VIEW COMPARISON:  Chest x-ray 05/01/2022 FINDINGS: The heart and mediastinal contours are unchanged. Atherosclerotic plaque. No focal consolidation. No pulmonary edema. No pleural effusion. No pneumothorax. No acute osseous abnormality. IMPRESSION: 1. No active cardiopulmonary disease. 2.  Aortic Atherosclerosis (ICD10-I70.0). Electronically Signed   By: Morgane  Naveau M.D.   On: 08/04/2023 00:00   CT Renal Stone Study Result Date: 08/03/2023 CLINICAL DATA:  Epigastric pain EXAM: CT ABDOMEN AND PELVIS WITHOUT CONTRAST TECHNIQUE: Multidetector CT imaging of the  abdomen and pelvis was performed following the standard protocol without IV contrast. RADIATION DOSE REDUCTION: This exam was performed according to the departmental dose-optimization program which includes automated exposure control, adjustment of the mA and/or kV according to patient size and/or use of iterative reconstruction technique. COMPARISON:  CT 11/28/2021 FINDINGS: Lower chest: Lung bases demonstrate no acute airspace disease. Coronary vascular calcification. Hepatobiliary: Cholecystectomy. Similar mild enlargement of common bile duct. Pancreas: Unremarkable. No pancreatic ductal dilatation or surrounding inflammatory changes. Spleen: Normal in size without focal abnormality. Adrenals/Urinary Tract: Adrenal glands are normal. Status post left nephrectomy. There is mild right hydronephrosis and hydroureter, but no obstructing stone. Distended urinary bladder. Stomach/Bowel: Stomach nonenlarged. No dilated small bowel. No acute bowel wall thickening. Vascular/Lymphatic: Aortic atherosclerosis. No enlarged abdominal or pelvic lymph nodes. Reproductive: Status post hysterectomy. No adnexal masses. Other: Negative for pelvic effusion or free air. Clips in the left pelvis and retroperitoneum Musculoskeletal: No acute or suspicious osseous abnormality  IMPRESSION: 1. Mild right hydronephrosis and hydroureter, but no obstructing stone. There is moderate urinary bladder distention. 2. Status post left nephrectomy. 3. Aortic atherosclerosis. Aortic Atherosclerosis (ICD10-I70.0). Electronically Signed   By: Esmeralda Hedge M.D.   On: 08/03/2023 23:47    Procedures .Critical Care  Performed by: Eve Hinders, MD Authorized by: Eve Hinders, MD   Critical care provider statement:    Critical care time (minutes):  30   Critical care was necessary to treat or prevent imminent or life-threatening deterioration of the following conditions:  Metabolic crisis   Critical care was time spent personally by me on the following activities:  Development of treatment plan with patient or surrogate, discussions with consultants, evaluation of patient's response to treatment, examination of patient, ordering and review of laboratory studies, ordering and review of radiographic studies, ordering and performing treatments and interventions, pulse oximetry, re-evaluation of patient's condition and review of old charts     Medications Ordered in ED Medications  Chlorhexidine  Gluconate Cloth 2 % PADS 6 each (has no administration in time range)  alum & mag hydroxide-simeth (MAALOX/MYLANTA) 200-200-20 MG/5ML suspension 30 mL (30 mLs Oral Given 08/04/23 0016)    And  lidocaine  (XYLOCAINE ) 2 % viscous mouth solution 15 mL (15 mLs Oral Given 08/04/23 0017)  pantoprazole  (PROTONIX ) injection 40 mg (40 mg Intravenous Given 08/04/23 0016)  fentaNYL  (SUBLIMAZE ) injection 50 mcg (50 mcg Intravenous Given 08/04/23 0016)  potassium chloride  10 mEq in 100 mL IVPB (0 mEq Intravenous Stopped 08/04/23 0326)  magnesium  sulfate IVPB 2 g 50 mL (0 g Intravenous Stopped 08/04/23 0137)  dextrose  50 % solution 50 mL (50 mLs Intravenous Given 08/04/23 0143)    ED Course/ Medical Decision Making/ A&P                                 Medical Decision Making Amount and/or Complexity of Data  Reviewed Labs: ordered. Radiology: ordered.  Risk OTC drugs. Prescription drug management. Decision regarding hospitalization.  Hemoccult negative. - will check labs - could just be PUD/gastritis/etc.  Evaluate for dka/hhs.   Hypoglycemia and hypokalemia with mild hypomagnesemia. IV K and Mg ordered. D 50 and food got CBG>100. Rest of workup reassuring. Ct w/ e/o hydrouretery and hydronephrosis, this can be evaluated as an outpatient with urology, Medicine aware. D/w Dr. Adefeso for admit.   Final Clinical Impression(s) / ED Diagnoses Final diagnoses:  Hypokalemia  Accidental drug overdose, initial encounter    Rx /  DC Orders ED Discharge Orders     None         Cortlandt Capuano, Reymundo Caulk, MD 08/04/23 [phone]

## 2023-08-04 DIAGNOSIS — N1330 Unspecified hydronephrosis: Secondary | ICD-10-CM

## 2023-08-04 DIAGNOSIS — R338 Other retention of urine: Secondary | ICD-10-CM | POA: Diagnosis not present

## 2023-08-04 DIAGNOSIS — E11649 Type 2 diabetes mellitus with hypoglycemia without coma: Secondary | ICD-10-CM | POA: Diagnosis not present

## 2023-08-04 DIAGNOSIS — R197 Diarrhea, unspecified: Secondary | ICD-10-CM

## 2023-08-04 DIAGNOSIS — R1084 Generalized abdominal pain: Secondary | ICD-10-CM | POA: Diagnosis not present

## 2023-08-04 DIAGNOSIS — E8809 Other disorders of plasma-protein metabolism, not elsewhere classified: Secondary | ICD-10-CM | POA: Diagnosis not present

## 2023-08-04 DIAGNOSIS — R9431 Abnormal electrocardiogram [ECG] [EKG]: Secondary | ICD-10-CM | POA: Insufficient documentation

## 2023-08-04 DIAGNOSIS — E876 Hypokalemia: Secondary | ICD-10-CM | POA: Diagnosis not present

## 2023-08-04 DIAGNOSIS — E46 Unspecified protein-calorie malnutrition: Secondary | ICD-10-CM

## 2023-08-04 DIAGNOSIS — R109 Unspecified abdominal pain: Secondary | ICD-10-CM | POA: Insufficient documentation

## 2023-08-04 DIAGNOSIS — I160 Hypertensive urgency: Secondary | ICD-10-CM

## 2023-08-04 DIAGNOSIS — R7401 Elevation of levels of liver transaminase levels: Secondary | ICD-10-CM

## 2023-08-04 DIAGNOSIS — R569 Unspecified convulsions: Secondary | ICD-10-CM

## 2023-08-04 DIAGNOSIS — I10 Essential (primary) hypertension: Secondary | ICD-10-CM

## 2023-08-04 DIAGNOSIS — Z794 Long term (current) use of insulin: Secondary | ICD-10-CM

## 2023-08-04 LAB — TROPONIN I (HIGH SENSITIVITY): Troponin I (High Sensitivity): 12 ng/L (ref <18)

## 2023-08-04 LAB — COMPREHENSIVE METABOLIC PANEL WITH GFR
ALT: 52 U/L — ABNORMAL HIGH (ref 0–44)
AST: 84 U/L — ABNORMAL HIGH (ref 15–41)
Albumin: 3.4 g/dL — ABNORMAL LOW (ref 3.5–5.0)
Alkaline Phosphatase: 42 U/L (ref 38–126)
Anion gap: 6 (ref 5–15)
BUN: 14 mg/dL (ref 8–23)
CO2: 25 mmol/L (ref 22–32)
Calcium: 8.7 mg/dL — ABNORMAL LOW (ref 8.9–10.3)
Chloride: 101 mmol/L (ref 98–111)
Creatinine, Ser: 0.9 mg/dL (ref 0.44–1.00)
GFR, Estimated: >60 mL/min (ref >60)
Glucose, Bld: 148 mg/dL — ABNORMAL HIGH (ref 70–99)
Potassium: 2.2 mmol/L — CL (ref 3.5–5.1)
Sodium: 132 mmol/L — ABNORMAL LOW (ref 135–145)
Total Bilirubin: 0.3 mg/dL (ref 0.0–1.2)
Total Protein: 6.7 g/dL (ref 6.5–8.1)

## 2023-08-04 LAB — MRSA NEXT GEN BY PCR, NASAL: MRSA by PCR Next Gen: NOT DETECTED

## 2023-08-04 LAB — URINALYSIS, ROUTINE W REFLEX MICROSCOPIC
Bacteria, UA: NONE SEEN
Bilirubin Urine: NEGATIVE
Glucose, UA: >=500 mg/dL — AB
Hgb urine dipstick: NEGATIVE
Ketones, ur: NEGATIVE mg/dL
Leukocytes,Ua: NEGATIVE
Nitrite: NEGATIVE
Protein, ur: NEGATIVE mg/dL
Specific Gravity, Urine: 1.001 — ABNORMAL LOW (ref 1.005–1.030)
pH: 6 (ref 5.0–8.0)

## 2023-08-04 LAB — BLOOD GAS, VENOUS
Acid-Base Excess: 3.2 mmol/L — ABNORMAL HIGH (ref 0.0–2.0)
Bicarbonate: 30.6 mmol/L — ABNORMAL HIGH (ref 20.0–28.0)
Drawn by: 53361
O2 Saturation: 56.8 %
Patient temperature: 36.6
pCO2, Ven: 57 mmHg (ref 44–60)
pH, Ven: 7.34 (ref 7.25–7.43)
pO2, Ven: 34 mmHg (ref 32–45)

## 2023-08-04 LAB — GLUCOSE, CAPILLARY
Glucose-Capillary: 98 mg/dL (ref 70–99)
Glucose-Capillary: 139 mg/dL — ABNORMAL HIGH (ref 70–99)
Glucose-Capillary: 225 mg/dL — ABNORMAL HIGH (ref 70–99)

## 2023-08-04 LAB — HEMOGLOBIN A1C
Hgb A1c MFr Bld: 8.9 % — ABNORMAL HIGH (ref 4.8–5.6)
Mean Plasma Glucose: 208.73 mg/dL

## 2023-08-04 LAB — MAGNESIUM: Magnesium: 1.6 mg/dL — ABNORMAL LOW (ref 1.7–2.4)

## 2023-08-04 LAB — CBG MONITORING, ED
Glucose-Capillary: 106 mg/dL — ABNORMAL HIGH (ref 70–99)
Glucose-Capillary: 64 mg/dL — ABNORMAL LOW (ref 70–99)
Glucose-Capillary: 66 mg/dL — ABNORMAL LOW (ref 70–99)

## 2023-08-04 LAB — POTASSIUM: Potassium: 2.9 mmol/L — ABNORMAL LOW (ref 3.5–5.1)

## 2023-08-04 LAB — HIV ANTIBODY (ROUTINE TESTING W REFLEX): HIV Screen 4th Generation wRfx: NONREACTIVE

## 2023-08-04 LAB — LIPASE, BLOOD: Lipase: 36 U/L (ref 11–51)

## 2023-08-04 LAB — POC OCCULT BLOOD, ED

## 2023-08-04 MED ORDER — INSULIN ASPART 100 UNIT/ML IJ SOLN: 0.0000 [IU] | Freq: Three times a day (TID) | INTRAMUSCULAR | Status: DC

## 2023-08-04 MED ORDER — POTASSIUM CHLORIDE 10 MEQ/100ML IV SOLN
10.0000 meq | INTRAVENOUS | Status: AC
Start: 2023-08-04 — End: 2023-08-04
  Administered 2023-08-04 (×3): 10 meq via INTRAVENOUS
  Filled 2023-08-04 (×3): qty 100

## 2023-08-04 MED ORDER — MAGNESIUM SULFATE 2 GM/50ML IV SOLN
2.0000 g | Freq: Once | INTRAVENOUS | Status: AC
  Administered 2023-08-04: 2 g via INTRAVENOUS
  Filled 2023-08-04: qty 50

## 2023-08-04 MED ORDER — ALPRAZOLAM 0.5 MG PO TABS
0.5000 mg | ORAL_TABLET | Freq: Three times a day (TID) | ORAL | Status: DC | PRN
  Administered 2023-08-04 (×2): 0.5 mg via ORAL
  Filled 2023-08-04 (×2): qty 1

## 2023-08-04 MED ORDER — PROCHLORPERAZINE EDISYLATE 10 MG/2ML IJ SOLN: 10.0000 mg | Freq: Four times a day (QID) | INTRAMUSCULAR | Status: DC | PRN

## 2023-08-04 MED ORDER — HYDRALAZINE HCL 20 MG/ML IJ SOLN
10.0000 mg | Freq: Four times a day (QID) | INTRAMUSCULAR | Status: DC | PRN
Start: 2023-08-04 — End: 2023-08-04
  Administered 2023-08-04 (×2): 10 mg via INTRAVENOUS
  Filled 2023-08-04 (×2): qty 1

## 2023-08-04 MED ORDER — POTASSIUM CHLORIDE CRYS ER 20 MEQ PO TBCR
40.0000 meq | EXTENDED_RELEASE_TABLET | Freq: Two times a day (BID) | ORAL | Status: DC
  Administered 2023-08-04: 40 meq via ORAL
  Filled 2023-08-04: qty 2

## 2023-08-04 MED ORDER — LOSARTAN POTASSIUM 50 MG PO TABS
50.0000 mg | ORAL_TABLET | Freq: Once | ORAL | Status: AC
  Administered 2023-08-04: 50 mg via ORAL
  Filled 2023-08-04: qty 1

## 2023-08-04 MED ORDER — ENOXAPARIN SODIUM 40 MG/0.4ML IJ SOSY
40.0000 mg | PREFILLED_SYRINGE | INTRAMUSCULAR | Status: DC
  Administered 2023-08-04: 40 mg via SUBCUTANEOUS
  Filled 2023-08-04: qty 0.4

## 2023-08-04 MED ORDER — ACETAMINOPHEN 650 MG RE SUPP: 650.0000 mg | Freq: Four times a day (QID) | RECTAL | Status: DC | PRN

## 2023-08-04 MED ORDER — ACETAMINOPHEN 325 MG PO TABS
650.0000 mg | ORAL_TABLET | Freq: Four times a day (QID) | ORAL | Status: DC | PRN
  Administered 2023-08-04 (×2): 650 mg via ORAL
  Filled 2023-08-04: qty 2

## 2023-08-04 MED ORDER — DEXTROSE 50 % IV SOLN
50.0000 mL | Freq: Once | INTRAVENOUS | Status: AC
  Administered 2023-08-04: 50 mL via INTRAVENOUS
  Filled 2023-08-04: qty 50

## 2023-08-04 MED ORDER — LOSARTAN POTASSIUM 50 MG PO TABS
50.0000 mg | ORAL_TABLET | Freq: Every day | ORAL | Status: DC
  Administered 2023-08-04: 50 mg via ORAL
  Filled 2023-08-04: qty 1

## 2023-08-04 MED ORDER — INSULIN ASPART 100 UNIT/ML IJ SOLN: 0.0000 [IU] | Freq: Every day | INTRAMUSCULAR | Status: DC

## 2023-08-04 MED ORDER — LEVETIRACETAM 500 MG PO TABS
1000.0000 mg | ORAL_TABLET | Freq: Two times a day (BID) | ORAL | Status: DC
  Administered 2023-08-04: 1000 mg via ORAL
  Filled 2023-08-04 (×2): qty 2

## 2023-08-04 MED ORDER — CHLORHEXIDINE GLUCONATE CLOTH 2 % EX PADS
6.0000 | MEDICATED_PAD | Freq: Every day | CUTANEOUS | Status: DC
  Administered 2023-08-04: 6 via TOPICAL

## 2023-08-04 MED ORDER — BETHANECHOL CHLORIDE 10 MG PO TABS
10.0000 mg | ORAL_TABLET | Freq: Three times a day (TID) | ORAL | Status: DC
  Administered 2023-08-04: 10 mg via ORAL
  Filled 2023-08-04 (×4): qty 1

## 2023-08-04 MED ORDER — LOSARTAN POTASSIUM 50 MG PO TABS: 100.0000 mg | ORAL_TABLET | Freq: Every day | ORAL | Status: DC

## 2023-08-04 MED ORDER — AMLODIPINE BESYLATE 5 MG PO TABS
5.0000 mg | ORAL_TABLET | Freq: Every day | ORAL | Status: DC
  Administered 2023-08-04: 5 mg via ORAL
  Filled 2023-08-04: qty 1

## 2023-08-04 MED ORDER — POTASSIUM CHLORIDE 10 MEQ/100ML IV SOLN
10.0000 meq | INTRAVENOUS | Status: AC
  Administered 2023-08-04 (×3): 10 meq via INTRAVENOUS
  Filled 2023-08-04 (×3): qty 100

## 2023-08-04 MED ORDER — GLUCERNA SHAKE PO LIQD: 237.0000 mL | Freq: Three times a day (TID) | ORAL | Status: DC

## 2023-08-04 NOTE — Plan of Care (Signed)

## 2023-08-04 NOTE — Discharge Summary (Signed)
 Physician Discharge Summary  Veronica Moses AOZ:[id number] DOB: Oct 03, 1955 DOA: 08/03/2023  PCP: Wendi Ham, NP  Admit date: 08/03/2023  Discharge date: 08/04/2023 AMA DISCHARGE  Admitted From:Home  Disposition:  AMA DISCHARGE  CODE STATUS: Full  Brief/Interim Summary:  Veronica Moses is a 68 y.o. female with medical history significant of COPD, diabetes mellitus, seizure disorder, tobacco abuse who presents to the emergency department due to abdominal pain.  Patient worsened tonight after eating.  Patient also complained of glucometer reading " too high ", so she took 8 units of insulin  x 3 with 3-hour intervals.  Blood glucose level dropped to 53.    Patient was admitted with severe electrolyte abnormalities with hypokalemia and hypomagnesemia along with hypoglycemia.  She is also noted to have hypertensive urgency and is requiring repeat doses of hydralazine  and is currently quite anxious.  Her hypokalemia was improving as were her blood pressures.  Unfortunately, she continued to request to be discharged and had full decision-making capacity and was alert and oriented x 4.  She understood the risks of doing so and will have Foley catheter removed prior to discharge.  She is at high risk for readmission or worse.  She has signed AMA paperwork and will now be discharged.  Discharge Diagnoses:  Principal Problem:   Hypokalemia Active Problems:   Seizure (HCC)   Hydronephrosis of right kidney   Hypomagnesemia   Type 2 diabetes mellitus with hypoglycemia (HCC)   Hypoalbuminemia due to protein-calorie malnutrition (HCC)   Prolonged QT interval   Acute urinary retention   Transaminitis   Abdominal pain   Diarrhea   Hypertensive urgency   Essential hypertension    Allergies  Allergen Reactions   Demerol Other (See Comments)    hallucinations   Depakote  [Divalproex  Sodium] Other (See Comments)    Sore/blisters   Dilaudid  [Hydromorphone  Hcl] Nausea And Vomiting   Doxycycline  Other (See  Comments)    "Blistered"   Valproic Acid  Rash    Consultations: None   Procedures/Studies: DG Chest 2 View Result Date: 08/04/2023 CLINICAL DATA:  eval for epigastric pain EXAM: CHEST - 2 VIEW COMPARISON:  Chest x-ray 05/01/2022 FINDINGS: The heart and mediastinal contours are unchanged. Atherosclerotic plaque. No focal consolidation. No pulmonary edema. No pleural effusion. No pneumothorax. No acute osseous abnormality. IMPRESSION: 1. No active cardiopulmonary disease. 2.  Aortic Atherosclerosis (ICD10-I70.0). Electronically Signed   By: Morgane  Naveau M.D.   On: 08/04/2023 00:00   CT Renal Stone Study Result Date: 08/03/2023 CLINICAL DATA:  Epigastric pain EXAM: CT ABDOMEN AND PELVIS WITHOUT CONTRAST TECHNIQUE: Multidetector CT imaging of the abdomen and pelvis was performed following the standard protocol without IV contrast. RADIATION DOSE REDUCTION: This exam was performed according to the departmental dose-optimization program which includes automated exposure control, adjustment of the mA and/or kV according to patient size and/or use of iterative reconstruction technique. COMPARISON:  CT 11/28/2021 FINDINGS: Lower chest: Lung bases demonstrate no acute airspace disease. Coronary vascular calcification. Hepatobiliary: Cholecystectomy. Similar mild enlargement of common bile duct. Pancreas: Unremarkable. No pancreatic ductal dilatation or surrounding inflammatory changes. Spleen: Normal in size without focal abnormality. Adrenals/Urinary Tract: Adrenal glands are normal. Status post left nephrectomy. There is mild right hydronephrosis and hydroureter, but no obstructing stone. Distended urinary bladder. Stomach/Bowel: Stomach nonenlarged. No dilated small bowel. No acute bowel wall thickening. Vascular/Lymphatic: Aortic atherosclerosis. No enlarged abdominal or pelvic lymph nodes. Reproductive: Status post hysterectomy. No adnexal masses. Other: Negative for pelvic effusion or free air. Clips in  the left  pelvis and retroperitoneum Musculoskeletal: No acute or suspicious osseous abnormality IMPRESSION: 1. Mild right hydronephrosis and hydroureter, but no obstructing stone. There is moderate urinary bladder distention. 2. Status post left nephrectomy. 3. Aortic atherosclerosis. Aortic Atherosclerosis (ICD10-I70.0). Electronically Signed   By: Esmeralda Hedge M.D.   On: 08/03/2023 23:47     Discharge Exam: Vitals:   08/04/23 1500 08/04/23 1540  BP: (!) 194/52 (!) 214/66  Pulse: 76 91  Resp: (!) 23 19  Temp:    SpO2: 95% 96%   Vitals:   08/04/23 1400 08/04/23 1447 08/04/23 1500 08/04/23 1540  BP: (!) 208/42 (!) 212/53 (!) 194/52 (!) 214/66  Pulse: 79  76 91  Resp: 13  (!) 23 19  Temp:      TempSrc:      SpO2: 95%  95% 96%  Weight:      Height:        General: Pt is alert, awake, not in acute distress Cardiovascular: RRR, S1/S2 +, no rubs, no gallops Respiratory: CTA bilaterally, no wheezing, no rhonchi Abdominal: Soft, NT, ND, bowel sounds + Extremities: no edema, no cyanosis    The results of significant diagnostics from this hospitalization (including imaging, microbiology, ancillary and laboratory) are listed below for reference.     Microbiology: Recent Results (from the past 240 hours)  MRSA Next Gen by PCR, Nasal     Status: None   Collection Time: 08/04/23  4:07 AM   Specimen: Nasal Mucosa; Nasal Swab  Result Value Ref Range Status   MRSA by PCR Next Gen NOT DETECTED NOT DETECTED Final    Comment: (NOTE) The GeneXpert MRSA Assay (FDA approved for NASAL specimens only), is one component of a comprehensive MRSA colonization surveillance program. It is not intended to diagnose MRSA infection nor to guide or monitor treatment for MRSA infections. Test performance is not FDA approved in patients less than 35 years old. Performed at [facility], [address] [place], [place] [address]      Labs: BNP (last 3 results) No results for input(s): "BNP" in the  last 8760 hours. Basic Metabolic Panel: Recent Labs  Lab 08/03/23 2314 08/04/23 0006 08/04/23 1224  NA 132*  --   --   K 2.2*  --  2.9*  CL 101  --   --   CO2 25  --   --   GLUCOSE 148*  --   --   BUN 14  --   --   CREATININE 0.90  --   --   CALCIUM 8.7*  --   --   MG  --  1.6*  --    Liver Function Tests: Recent Labs  Lab 08/03/23 2314  AST 84*  ALT 52*  ALKPHOS 42  BILITOT 0.3  PROT 6.7  ALBUMIN 3.4*   Recent Labs  Lab 08/03/23 2314  LIPASE 36   No results for input(s): "AMMONIA" in the last 168 hours. CBC: Recent Labs  Lab 08/03/23 2314  WBC 7.7  HGB 12.2  HCT 37.0  MCV 88.9  PLT 222   Cardiac Enzymes: No results for input(s): "CKTOTAL", "CKMB", "CKMBINDEX", "TROPONINI" in the last 168 hours. BNP: Invalid input(s): "POCBNP" CBG: Recent Labs  Lab 08/04/23 0134 08/04/23 0309 08/04/23 0533 08/04/23 0739 08/04/23 1131  GLUCAP 64* 106* 98 139* 225*   D-Dimer No results for input(s): "DDIMER" in the last 72 hours. Hgb A1c No results for input(s): "HGBA1C" in the last 72 hours. Lipid Profile No results for input(s): "  CHOL", "HDL", "LDLCALC", "TRIG", "CHOLHDL", "LDLDIRECT" in the last 72 hours. Thyroid  function studies No results for input(s): "TSH", "T4TOTAL", "T3FREE", "THYROIDAB" in the last 72 hours.  Invalid input(s): "FREET3" Anemia work up No results for input(s): "VITAMINB12", "FOLATE", "FERRITIN", "TIBC", "IRON", "RETICCTPCT" in the last 72 hours. Urinalysis    Component Value Date/Time   COLORURINE COLORLESS (A) 08/03/2023 0210   APPEARANCEUR CLEAR 08/03/2023 0210   LABSPEC 1.001 (L) 08/03/2023 0210   PHURINE 6.0 08/03/2023 0210   GLUCOSEU >=500 (A) 08/03/2023 0210   HGBUR NEGATIVE 08/03/2023 0210   BILIRUBINUR NEGATIVE 08/03/2023 0210   KETONESUR NEGATIVE 08/03/2023 0210   PROTEINUR NEGATIVE 08/03/2023 0210   UROBILINOGEN 0.2 07/13/2014 1507   NITRITE NEGATIVE 08/03/2023 0210   LEUKOCYTESUR NEGATIVE 08/03/2023 0210   Sepsis  Labs Recent Labs  Lab 08/03/23 2314  WBC 7.7   Microbiology Recent Results (from the past 240 hours)  MRSA Next Gen by PCR, Nasal     Status: None   Collection Time: 08/04/23  4:07 AM   Specimen: Nasal Mucosa; Nasal Swab  Result Value Ref Range Status   MRSA by PCR Next Gen NOT DETECTED NOT DETECTED Final    Comment: (NOTE) The GeneXpert MRSA Assay (FDA approved for NASAL specimens only), is one component of a comprehensive MRSA colonization surveillance program. It is not intended to diagnose MRSA infection nor to guide or monitor treatment for MRSA infections. Test performance is not FDA approved in patients less than 8 years old. Performed at [facility], [address], [place], [place] [address]      Time coordinating discharge: 35 minutes  SIGNED:   Cornelius Dill, DO [facility] 08/04/2023, 4:01 PM  If 7PM-7AM, please contact night-coverage [email or web address]

## 2023-08-04 NOTE — TOC CM/SW Note (Signed)
 Transition of Care [facility]) - Inpatient Brief Assessment   Patient Details  Name: Veronica Moses MRN: [id number] Date of Birth: 03/08/1956  Transition of Care [facility]) CM/SW Contact:    Grandville Lax, LCSWA Phone Number: 08/04/2023, 8:44 AM   Clinical Narrative: Transition of Care Department [facility]) has reviewed patient and no TOC needs have been identified at this time. We will continue to monitor patient advancement through interdiciplinary progression rounds. If new patient transition needs arise, please place a TOC consult.   Transition of Care Asessment: Insurance and Status: Insurance coverage has been reviewed Patient has primary care physician: Yes Home environment has been reviewed: From home Prior level of function:: Independent Prior/Current Home Services: No current home services Social Drivers of Health Review: SDOH reviewed no interventions necessary Readmission risk has been reviewed: Yes Transition of care needs: no transition of care needs at this time

## 2023-08-04 NOTE — H&P (Signed)
 History and Physical    Patient: Veronica Moses ZHY:[id number] DOB: 30-May-1955 DOA: 08/03/2023 DOS: the patient was seen and examined on 08/04/2023 PCP: Wendi Ham, NP  Patient coming from: Home  Chief Complaint:  Chief Complaint  Patient presents with   Abdominal Pain   HPI: Veronica Moses is a 68 y.o. female with medical history significant of COPD, diabetes mellitus, seizure disorder, tobacco abuse who presents to the emergency department due to abdominal pain.  Patient worsened tonight after eating.  Patient also complained of glucometer reading " too high ", so she took 8 units of insulin  x 3 with 3-hour intervals.  Blood glucose level dropped to 53. She complained of 16-month onset of abdominal pain which was mainly epigastric with radiation to suprapubic area.  She states that she has been to her PCP several times on this, but no known cause for this has been detected.  She endorsed that she has not been to the gastroenterologist regarding this, though, she endorsed doing colonoscopy over a year ago.  She complained of chronic history of black stool which she thought was due to taking Pepto-Bismol. She complained of at least 3 months of diarrhea and also complained of pain on urination which was also chronic and that she usually try to hold her urine so as to avoid painful urination.  She states that she was once treated with antibiotics for this.    ED Course:  Emergency department, BP was 151/85, other vital signs were within normal range.  Workup in the ED showed normal CBC and BMP except for sodium of 132, potassium 2.2, blood glucose 148, albumin 3.4.  AST 84, ALT 52.  Magnesium  1.6, urinalysis was positive for glycosuria.  FOBT was negative. Chest x-ray showed no active cardiopulmonary disease CT abdomen and pelvis without contrast showed mild right hydronephrosis and hydroureter but no obstructing stone.  There is moderate urinary bladder distention Patient was treated with GI cocktail,  fentanyl  and Protonix  were given.  Magnesium  was replenished, potassium was replenished.  Hospitalist was asked to admit patient for further evaluation and management.  Review of Systems: Review of systems as noted in the HPI. All other systems reviewed and are negative.   Past Medical History:  Diagnosis Date   Anxiety    Closed fracture of right distal radius    COPD (chronic obstructive pulmonary disease) (HCC)    Depression    Diabetes mellitus    Insulin  Dependent. Adult onset   Dyspnea    with exertion   Heart murmur    Hypertension    Memory changes    since being on vent June 2017- "coming back some."   Seizures (HCC) 08/2015   last seizure 07-2019   Stroke [facility]) 2016   remote, no deficits   UTI (lower urinary tract infection)    Past Surgical History:  Procedure Laterality Date   ABDOMINAL HYSTERECTOMY     BREAST SURGERY Left    cyst-   CHOLECYSTECTOMY     COLONOSCOPY WITH ESOPHAGOGASTRODUODENOSCOPY (EGD) N/A 03/04/2013   Procedure: COLONOSCOPY WITH ESOPHAGOGASTRODUODENOSCOPY (EGD);  Surgeon: Ruby Corporal, MD;  Location: [facility];  Service: Endoscopy;  Laterality: N/A;  1200-moved to 1535 Ann notified pt   MULTIPLE EXTRACTIONS WITH ALVEOLOPLASTY N/A 05/30/2016   Procedure: MULTIPLE EXTRACTION WITH ALVEOLOPLASTY;  Surgeon: Ascencion Lava, DDS;  Location: [facility] OR;  Service: Oral Surgery;  Laterality: N/A;   NEPHRECTOMY Left    Donatated to brother   OPEN REDUCTION INTERNAL FIXATION (ORIF) DISTAL  RADIAL FRACTURE Right 03/22/2020   Procedure: OPEN REDUCTION INTERNAL FIXATION (ORIF) DISTAL RADIAL FRACTURE;  Surgeon: Lyanne Sample, MD;  Location: [facility];  Service: Orthopedics;  Laterality: Right;  AXILLARY BLOCK    Social History:  reports that she has been smoking cigarettes. She started smoking about 33 years ago. She has a 26 pack-year smoking history. She has never used smokeless tobacco. She reports that she does not drink alcohol and does not use  drugs.   Allergies  Allergen Reactions   Demerol Other (See Comments)    hallucinations   Depakote  [Divalproex  Sodium] Other (See Comments)    Sore/blisters   Dilaudid  [Hydromorphone  Hcl] Nausea And Vomiting   Doxycycline  Other (See Comments)    "Blistered"   Valproic Acid  Rash    Family History  Problem Relation Age of Onset   Diabetes Father    CAD Other        family history   Diabetes Brother    Colon cancer Neg Hx      Prior to Admission medications   Medication Sig Start Date End Date Taking? Authorizing Provider  acetaminophen  (TYLENOL ) 325 MG tablet Take 325 mg by mouth every 6 (six) hours as needed for mild pain or moderate pain.    [provider]  ALPRAZolam  (XANAX ) 0.5 MG tablet Take 0.5 mg by mouth 3 (three) times daily as needed. 02/17/20   [provider]  aspirin  81 MG chewable tablet Chew 81 mg by mouth daily.    [provider]  donepezil  (ARICEPT ) 10 MG tablet Take 1 tablet (10 mg total) by mouth at bedtime. 06/10/23   Wess Hammed, NP  escitalopram  (LEXAPRO ) 20 MG tablet Take 20 mg by mouth daily.    [provider]  gabapentin  (NEURONTIN ) 100 MG capsule Take 200 mg by mouth at bedtime.    [provider]  hydrochlorothiazide  (MICROZIDE ) 12.5 MG capsule Take 1 capsule (12.5 mg total) by mouth daily. 12/04/21   Verlyn Goad, MD  insulin  glargine (LANTUS ) 100 UNIT/ML Solostar Pen Inject 10 Units into the skin daily. 11/30/21   Verlyn Goad, MD  levETIRAcetam  (KEPPRA ) 1000 MG tablet Take 1 tablet (1,000 mg total) by mouth 2 (two) times daily. 06/04/23 05/29/24  Wess Hammed, NP  losartan  (COZAAR ) 50 MG tablet Take 1 tablet (50 mg total) by mouth daily. 12/04/21   Verlyn Goad, MD  NOVOLOG  FLEXPEN 100 UNIT/ML FlexPen Please use the hospital sliding scale provided for you Patient taking differently: Please use the hospital sliding scale provided for you take with each meal 08/25/15   Mariea Shook, MD  potassium  chloride 20 MEQ TBCR Take 20 mEq by mouth 2 (two) times daily for 3 days. 11/30/21 12/03/21  Verlyn Goad, MD  rosuvastatin (CRESTOR) 20 MG tablet Take 20 mg by mouth daily. 09/19/21   [provider]    Physical Exam: BP (!) 230/70 (BP Location: Left Arm)   Pulse (!) 57   Temp 98 F (36.7 C) (Oral)   Resp 13   Ht 5\' 1"  (1.549 m)   Wt 58.1 kg   SpO2 97%   BMI 24.19 kg/m   General: 68 y.o. year-old female well developed well nourished in no acute distress.  Alert and oriented x3. HEENT: NCAT, EOMI Neck: Supple, trachea medial Cardiovascular: Regular rate and rhythm with no rubs or gallops.  No thyromegaly or JVD noted.  No lower extremity edema. 2/4 pulses in all 4 extremities. Respiratory:  Clear to auscultation with no wheezes or rales. Good inspiratory effort. Abdomen: Soft, tender to palpation without guarding.  Nondistended with normal bowel sounds x4 quadrants. Muskuloskeletal: No cyanosis, clubbing or edema noted bilaterally Neuro: CN II-XII intact, strength 5/5 x 4, sensation, reflexes intact Skin: No ulcerative lesions noted or rashes Psychiatry: Mood is appropriate for condition and setting          Labs on Admission:  Basic Metabolic Panel: Recent Labs  Lab 08/03/23 2314 08/04/23 0006  NA 132*  --   K 2.2*  --   CL 101  --   CO2 25  --   GLUCOSE 148*  --   BUN 14  --   CREATININE 0.90  --   CALCIUM 8.7*  --   MG  --  1.6*   Liver Function Tests: Recent Labs  Lab 08/03/23 2314  AST 84*  ALT 52*  ALKPHOS 42  BILITOT 0.3  PROT 6.7  ALBUMIN 3.4*   Recent Labs  Lab 08/03/23 2314  LIPASE 36   No results for input(s): "AMMONIA" in the last 168 hours. CBC: Recent Labs  Lab 08/03/23 2314  WBC 7.7  HGB 12.2  HCT 37.0  MCV 88.9  PLT 222   Cardiac Enzymes: No results for input(s): "CKTOTAL", "CKMB", "CKMBINDEX", "TROPONINI" in the last 168 hours.  BNP (last 3 results) No results for input(s): "BNP" in the last 8760 hours.  ProBNP  (last 3 results) No results for input(s): "PROBNP" in the last 8760 hours.  CBG: Recent Labs  Lab 08/03/23 2355 08/04/23 0134 08/04/23 0309 08/04/23 0533  GLUCAP 66* 64* 106* 98    Radiological Exams on Admission: DG Chest 2 View Result Date: 08/04/2023 CLINICAL DATA:  eval for epigastric pain EXAM: CHEST - 2 VIEW COMPARISON:  Chest x-ray 05/01/2022 FINDINGS: The heart and mediastinal contours are unchanged. Atherosclerotic plaque. No focal consolidation. No pulmonary edema. No pleural effusion. No pneumothorax. No acute osseous abnormality. IMPRESSION: 1. No active cardiopulmonary disease. 2.  Aortic Atherosclerosis (ICD10-I70.0). Electronically Signed   By: Morgane  Naveau M.D.   On: 08/04/2023 00:00   CT Renal Stone Study Result Date: 08/03/2023 CLINICAL DATA:  Epigastric pain EXAM: CT ABDOMEN AND PELVIS WITHOUT CONTRAST TECHNIQUE: Multidetector CT imaging of the abdomen and pelvis was performed following the standard protocol without IV contrast. RADIATION DOSE REDUCTION: This exam was performed according to the departmental dose-optimization program which includes automated exposure control, adjustment of the mA and/or kV according to patient size and/or use of iterative reconstruction technique. COMPARISON:  CT 11/28/2021 FINDINGS: Lower chest: Lung bases demonstrate no acute airspace disease. Coronary vascular calcification. Hepatobiliary: Cholecystectomy. Similar mild enlargement of common bile duct. Pancreas: Unremarkable. No pancreatic ductal dilatation or surrounding inflammatory changes. Spleen: Normal in size without focal abnormality. Adrenals/Urinary Tract: Adrenal glands are normal. Status post left nephrectomy. There is mild right hydronephrosis and hydroureter, but no obstructing stone. Distended urinary bladder. Stomach/Bowel: Stomach nonenlarged. No dilated small bowel. No acute bowel wall thickening. Vascular/Lymphatic: Aortic atherosclerosis. No enlarged abdominal or pelvic  lymph nodes. Reproductive: Status post hysterectomy. No adnexal masses. Other: Negative for pelvic effusion or free air. Clips in the left pelvis and retroperitoneum Musculoskeletal: No acute or suspicious osseous abnormality IMPRESSION: 1. Mild right hydronephrosis and hydroureter, but no obstructing stone. There is moderate urinary bladder distention. 2. Status post left nephrectomy. 3. Aortic atherosclerosis. Aortic Atherosclerosis (ICD10-I70.0). Electronically Signed   By: Esmeralda Hedge M.D.   On: 08/03/2023 23:47    EKG: I  independently viewed the EKG done and my findings are as followed: Normal sinus rhythm  Assessment/Plan Present on Admission:  Hypokalemia  Hydronephrosis of right kidney  Principal Problem:   Hypokalemia Active Problems:   Seizure (HCC)   Hydronephrosis of right kidney   Hypomagnesemia   Type 2 diabetes mellitus with hypoglycemia (HCC)   Hypoalbuminemia due to protein-calorie malnutrition (HCC)   Prolonged QT interval   Acute urinary retention   Transaminitis   Abdominal pain   Diarrhea   Hypertensive urgency   Essential hypertension  Hypokalemia K+ is 2.2 K+ will be replenished Please monitor for AM K+ for further replenishmemnt  Hypomagnesemia Mg level is 1.6 This will be replenished Please continue to monitor Mg level and correct accordingly  Type 2 diabetes mellitus with hypoglycemia  Continue CBG and consider ISS with hypoglycemic protocol when blood glucose increased  Hypoalbuminemia possible secondary to mild protein calorie malnutrition Albumin 3.4, protein supplement will be provided  Prolonged QT interval QTc 502 ms Avoid QT prolonging drugs Potassium 2.2, this will be replenished Magnesium  1.6, this will be replenished Repeat EKG in the morning  Urinary retention Continue bethanechol Patient may need to follow-up with outpatient urology  Hypertensive urgency Essential hypertension Continue IV hydralazine  10 mg every 6 hours  for SBP > 170 Continue losartan   Seizure Continue Keppra   Transaminitis AST 84, ALT 52 Continue to monitor liver enzymes  Abdominal pain (chronic) Continue IV morphine  2 mg q.4h p.r.n. for moderate to severe pain Continue IV Compazine p.r.n. Continue clear liquid diet with plan to advanced diet as tolerated Consider GI consult for worsening pain  Diarrhea (chronic) Patient has not had any bowel movement since admission Consider C. difficile and GI stool panel if patient continues to have diarrhea  Right hydronephrosis and hydroureter Consider outpatient follow-up with urology on discharge   DVT prophylaxis: Lovenox   Code Status: Full code  Family Communication: None at bedside  Consults: None  Severity of Illness: The appropriate patient status for this patient is INPATIENT. Inpatient status is judged to be reasonable and necessary in order to provide the required intensity of service to ensure the patient's safety. The patient's presenting symptoms, physical exam findings, and initial radiographic and laboratory data in the context of their chronic comorbidities is felt to place them at high risk for further clinical deterioration. Furthermore, it is not anticipated that the patient will be medically stable for discharge from the hospital within 2 midnights of admission.   * I certify that at the point of admission it is my clinical judgment that the patient will require inpatient hospital care spanning beyond 2 midnights from the point of admission due to high intensity of service, high risk for further deterioration and high frequency of surveillance required.*  Author: Madelline Eshbach, DO 08/04/2023 6:23 AM  For on call review [email or web address].

## 2023-08-04 NOTE — ED Notes (Signed)
 Patient linen and attire was changed due to the patient urinating all over the bed.

## 2023-08-04 NOTE — ED Notes (Signed)
 ED TO INPATIENT HANDOFF REPORT  ED Nurse Name and Phone #: Lynder Sanger Medic  S Name/Age/Gender Veronica Moses 68 y.o. female Room/Bed: APA04/APA04  Code Status   Code Status: Prior  Home/SNF/Other Home Patient oriented to: self, place, and time Is this baseline? Yes   Triage Complete: Triage complete  Chief Complaint Hypokalemia [E87.6]  Triage Note Pt with c/o abdominal pain that radiates to her epigastric area. States pain started this evening after she ate supper.   Allergies Allergies  Allergen Reactions   Demerol Other (See Comments)    hallucinations   Depakote  [Divalproex  Sodium] Other (See Comments)    Sore/blisters   Dilaudid  [Hydromorphone  Hcl] Nausea And Vomiting   Doxycycline  Other (See Comments)    "Blistered"   Valproic Acid  Rash    Level of Care/Admitting Diagnosis ED Disposition     ED Disposition  Admit   Condition  --   Comment  Hospital Area: [facility] [[id number]]  Level of Care: Stepdown [14]  Covid Evaluation: Asymptomatic - no recent exposure (last 10 days) testing not required  Diagnosis: Hypokalemia [172180]  Admitting Physician: ADEFESO, OLADAPO [[id number]]  Attending Physician: ADEFESO, OLADAPO [[id number]]          B Medical/Surgery History Past Medical History:  Diagnosis Date   Anxiety    Closed fracture of right distal radius    COPD (chronic obstructive pulmonary disease) (HCC)    Depression    Diabetes mellitus    Insulin  Dependent. Adult onset   Dyspnea    with exertion   Heart murmur    Hypertension    Memory changes    since being on vent June 2017- "coming back some."   Seizures (HCC) 08/2015   last seizure 07-2019   Stroke [facility]) 2016   remote, no deficits   UTI (lower urinary tract infection)    Past Surgical History:  Procedure Laterality Date   ABDOMINAL HYSTERECTOMY     BREAST SURGERY Left    cyst-   CHOLECYSTECTOMY     COLONOSCOPY WITH ESOPHAGOGASTRODUODENOSCOPY (EGD) N/A 03/04/2013    Procedure: COLONOSCOPY WITH ESOPHAGOGASTRODUODENOSCOPY (EGD);  Surgeon: Ruby Corporal, MD;  Location: [facility];  Service: Endoscopy;  Laterality: N/A;  1200-moved to 1535 Ann notified pt   MULTIPLE EXTRACTIONS WITH ALVEOLOPLASTY N/A 05/30/2016   Procedure: MULTIPLE EXTRACTION WITH ALVEOLOPLASTY;  Surgeon: Ascencion Lava, DDS;  Location: [facility] OR;  Service: Oral Surgery;  Laterality: N/A;   NEPHRECTOMY Left    Donatated to brother   OPEN REDUCTION INTERNAL FIXATION (ORIF) DISTAL RADIAL FRACTURE Right 03/22/2020   Procedure: OPEN REDUCTION INTERNAL FIXATION (ORIF) DISTAL RADIAL FRACTURE;  Surgeon: Lyanne Sample, MD;  Location: [facility];  Service: Orthopedics;  Laterality: Right;  AXILLARY BLOCK     A IV Location/Drains/Wounds Patient Lines/Drains/Airways Status     Active Line/Drains/Airways     Name Placement date Placement time Site Days   Peripheral IV 08/03/23 22 G Posterior;Right Forearm 08/03/23  2317  Forearm  1   Peripheral IV 08/04/23 22 G Left;Posterior Hand 08/04/23  0016  Hand  less than 1            Intake/Output Last 24 hours  Intake/Output Summary (Last 24 hours) at 08/04/2023 0340 Last data filed at 08/04/2023 0326 Gross per 24 hour  Intake 350 ml  Output --  Net 350 ml    Labs/Imaging Results for orders placed or performed during the hospital encounter of 08/03/23 (from the past 48 hours)  Urinalysis, Routine w  reflex microscopic -Urine, Clean Catch     Status: Abnormal   Collection Time: 08/03/23  2:10 AM  Result Value Ref Range   Color, Urine COLORLESS (A) YELLOW   APPearance CLEAR CLEAR   Specific Gravity, Urine 1.001 (L) 1.005 - 1.030   pH 6.0 5.0 - 8.0   Glucose, UA >=500 (A) NEGATIVE mg/dL   Hgb urine dipstick NEGATIVE NEGATIVE   Bilirubin Urine NEGATIVE NEGATIVE   Ketones, ur NEGATIVE NEGATIVE mg/dL   Protein, ur NEGATIVE NEGATIVE mg/dL   Nitrite NEGATIVE NEGATIVE   Leukocytes,Ua NEGATIVE NEGATIVE   RBC / HPF 0-5 0 - 5 RBC/hpf    WBC, UA 0-5 0 - 5 WBC/hpf   Bacteria, UA NONE SEEN NONE SEEN   Squamous Epithelial / HPF 0-5 0 - 5 /HPF    Comment: Performed at [facility], [address], [place], [place] [address]  Lipase, blood     Status: None   Collection Time: 08/03/23 11:14 PM  Result Value Ref Range   Lipase 36 11 - 51 U/L    Comment: Performed at [facility], [address] [place], [place] [address]  Comprehensive metabolic panel     Status: Abnormal   Collection Time: 08/03/23 11:14 PM  Result Value Ref Range   Sodium 132 (L) 135 - 145 mmol/L   Potassium 2.2 (LL) 3.5 - 5.1 mmol/L    Comment: CRITICAL RESULT CALLED TO, READ BACK BY AND VERIFIED WITH C. Heyden Jaber [phone], VIRAY,J   Chloride 101 98 - 111 mmol/L   CO2 25 22 - 32 mmol/L   Glucose, Bld 148 (H) 70 - 99 mg/dL    Comment: Glucose reference range applies only to samples taken after fasting for at least 8 hours.   BUN 14 8 - 23 mg/dL   Creatinine, Ser 4.25 0.44 - 1.00 mg/dL   Calcium 8.7 (L) 8.9 - 10.3 mg/dL   Total Protein 6.7 6.5 - 8.1 g/dL   Albumin 3.4 (L) 3.5 - 5.0 g/dL   AST 84 (H) 15 - 41 U/L   ALT 52 (H) 0 - 44 U/L   Alkaline Phosphatase 42 38 - 126 U/L   Total Bilirubin 0.3 0.0 - 1.2 mg/dL   GFR, Estimated >95 >63 mL/min    Comment: (NOTE) Calculated using the CKD-EPI Creatinine Equation (2021)    Anion gap 6 5 - 15    Comment: Performed at [facility], [address], [place], [place] [address]  CBC     Status: Abnormal   Collection Time: 08/03/23 11:14 PM  Result Value Ref Range   WBC 7.7 4.0 - 10.5 K/uL   RBC 4.16 3.87 - 5.11 MIL/uL   Hemoglobin 12.2 12.0 - 15.0 g/dL   HCT 33.2 95.1 - 88.4 %   MCV 88.9 80.0 - 100.0 fL   MCH 29.3 26.0 - 34.0 pg   MCHC 33.0 30.0 - 36.0 g/dL   RDW 16.6 (H) 06.3 - 01.6 %   Platelets 222 150 - 400 K/uL   nRBC 0.0 0.0 - 0.2 %    Comment: Performed at [facility], [address], [place], [place] [address]  Troponin I (High Sensitivity)     Status: None   Collection Time: 08/03/23 11:14 PM   Result Value Ref Range   Troponin I (High Sensitivity) 12 <18 ng/L    Comment: (NOTE) Elevated high sensitivity troponin I (hsTnI) values and significant  changes across serial measurements may suggest ACS but many other  chronic and  acute conditions are known to elevate hsTnI results.  Refer to the "Links" section for chest pain algorithms and additional  guidance. Performed at [facility], [address] [place], [place] [address]   CBG monitoring, ED     Status: Abnormal   Collection Time: 08/03/23 11:55 PM  Result Value Ref Range   Glucose-Capillary 66 (L) 70 - 99 mg/dL    Comment: Glucose reference range applies only to samples taken after fasting for at least 8 hours.  POC occult blood, ED     Status: None   Collection Time: 08/04/23 12:02 AM  Result Value Ref Range   Negative    Magnesium      Status: Abnormal   Collection Time: 08/04/23 12:06 AM  Result Value Ref Range   Magnesium  1.6 (L) 1.7 - 2.4 mg/dL    Comment: Performed at [facility], [address] [place], [place] [address]  Blood gas, venous     Status: Abnormal   Collection Time: 08/04/23  1:26 AM  Result Value Ref Range   pH, Ven 7.34 7.25 - 7.43   pCO2, Ven 57 44 - 60 mmHg   pO2, Ven 34 32 - 45 mmHg   Bicarbonate 30.6 (H) 20.0 - 28.0 mmol/L   Acid-Base Excess 3.2 (H) 0.0 - 2.0 mmol/L   O2 Saturation 56.8 %   Patient temperature 36.6    Collection site LEFT ANTECUBITAL    Drawn by [phone]     Comment: Performed at [facility], [address], [place], [place] [address]  CBG monitoring, ED     Status: Abnormal   Collection Time: 08/04/23  1:34 AM  Result Value Ref Range   Glucose-Capillary 64 (L) 70 - 99 mg/dL    Comment: Glucose reference range applies only to samples taken after fasting for at least 8 hours.  CBG monitoring, ED     Status: Abnormal   Collection Time: 08/04/23  3:09 AM  Result Value Ref Range   Glucose-Capillary 106 (H) 70 - 99 mg/dL    Comment: Glucose reference range applies only  to samples taken after fasting for at least 8 hours.   DG Chest 2 View Result Date: 08/04/2023 CLINICAL DATA:  eval for epigastric pain EXAM: CHEST - 2 VIEW COMPARISON:  Chest x-ray 05/01/2022 FINDINGS: The heart and mediastinal contours are unchanged. Atherosclerotic plaque. No focal consolidation. No pulmonary edema. No pleural effusion. No pneumothorax. No acute osseous abnormality. IMPRESSION: 1. No active cardiopulmonary disease. 2.  Aortic Atherosclerosis (ICD10-I70.0). Electronically Signed   By: Morgane  Naveau M.D.   On: 08/04/2023 00:00   CT Renal Stone Study Result Date: 08/03/2023 CLINICAL DATA:  Epigastric pain EXAM: CT ABDOMEN AND PELVIS WITHOUT CONTRAST TECHNIQUE: Multidetector CT imaging of the abdomen and pelvis was performed following the standard protocol without IV contrast. RADIATION DOSE REDUCTION: This exam was performed according to the departmental dose-optimization program which includes automated exposure control, adjustment of the mA and/or kV according to patient size and/or use of iterative reconstruction technique. COMPARISON:  CT 11/28/2021 FINDINGS: Lower chest: Lung bases demonstrate no acute airspace disease. Coronary vascular calcification. Hepatobiliary: Cholecystectomy. Similar mild enlargement of common bile duct. Pancreas: Unremarkable. No pancreatic ductal dilatation or surrounding inflammatory changes. Spleen: Normal in size without focal abnormality. Adrenals/Urinary Tract: Adrenal glands are normal. Status post left nephrectomy. There is mild right hydronephrosis and hydroureter, but no obstructing stone. Distended urinary bladder. Stomach/Bowel: Stomach nonenlarged. No dilated small bowel. No acute bowel wall thickening. Vascular/Lymphatic: Aortic atherosclerosis. No enlarged  abdominal or pelvic lymph nodes. Reproductive: Status post hysterectomy. No adnexal masses. Other: Negative for pelvic effusion or free air. Clips in the left pelvis and retroperitoneum  Musculoskeletal: No acute or suspicious osseous abnormality IMPRESSION: 1. Mild right hydronephrosis and hydroureter, but no obstructing stone. There is moderate urinary bladder distention. 2. Status post left nephrectomy. 3. Aortic atherosclerosis. Aortic Atherosclerosis (ICD10-I70.0). Electronically Signed   By: Esmeralda Hedge M.D.   On: 08/03/2023 23:47    Pending Labs Unresulted Labs (From admission, onward)     Start     Ordered   08/03/23 2323  Levetiracetam  level  Once,   URGENT        08/03/23 2322            Vitals/Pain Today's Vitals   08/03/23 2247 08/03/23 2248 08/04/23 0030 08/04/23 0130  BP:  (!) 151/85 (!) 171/57 (!) 169/57  Pulse:  71 62 65  Resp:  20 15 14   Temp:  97.8 F (36.6 C)    TempSrc:  Oral    SpO2:  97% 93% 94%  Weight:  128 lb (58.1 kg)    Height:  5\' 1"  (1.549 m)    PainSc: 10-Worst pain ever       Isolation Precautions No active isolations  Medications Medications  alum & mag hydroxide-simeth (MAALOX/MYLANTA) 200-200-20 MG/5ML suspension 30 mL (30 mLs Oral Given 08/04/23 0016)    And  lidocaine  (XYLOCAINE ) 2 % viscous mouth solution 15 mL (15 mLs Oral Given 08/04/23 0017)  pantoprazole  (PROTONIX ) injection 40 mg (40 mg Intravenous Given 08/04/23 0016)  fentaNYL  (SUBLIMAZE ) injection 50 mcg (50 mcg Intravenous Given 08/04/23 0016)  potassium chloride  10 mEq in 100 mL IVPB (0 mEq Intravenous Stopped 08/04/23 0326)  magnesium  sulfate IVPB 2 g 50 mL (0 g Intravenous Stopped 08/04/23 0137)  dextrose  50 % solution 50 mL (50 mLs Intravenous Given 08/04/23 0143)    Mobility      Focused Assessments    R Recommendations: See Admitting Provider Note  Report given to: [facility]   Additional Notes:

## 2023-08-04 NOTE — Progress Notes (Signed)
 Veronica Moses is a 68 y.o. female with medical history significant of COPD, diabetes mellitus, seizure disorder, tobacco abuse who presents to the emergency department due to abdominal pain.  Patient worsened tonight after eating.  Patient also complained of glucometer reading " too high ", so she took 8 units of insulin  x 3 with 3-hour intervals.  Blood glucose level dropped to 53.   Patient was admitted with severe electrolyte abnormalities with hypokalemia and hypomagnesemia along with hypoglycemia.  She is also noted to have hypertensive urgency and is requiring repeat doses of hydralazine  and is currently quite anxious.  Hypokalemia is improving, but she will require more oral dosing.  Hypoglycemia has resolved and diet will be advanced and she will be started on sliding scale insulin .  Foley catheter placed due to issues with urinary retention and hydroureter and plan would likely be to maintain on Foley catheter until outpatient urology follow-up.  No signs of AKI noted.  Continue to monitor electrolytes in a.m. and continue to monitor blood pressures closely and use hydralazine  as needed, this will require ongoing ICU monitoring through today.  Will plan to initiate amlodipine  5 mg today and increase home losartan  dose from 50 mg to 100 mg.  Total care time: 35 minutes.

## 2023-08-04 NOTE — Inpatient Diabetes Management (Signed)
 Inpatient Diabetes Program Recommendations  AACE/ADA: New Consensus Statement on Inpatient Glycemic Control   Target Ranges:  Prepandial:   less than 140 mg/dL      Peak postprandial:   less than 180 mg/dL (1-2 hours)      Critically ill patients:  140 - 180 mg/dL    Latest Reference Range & Units 08/03/23 23:55 08/04/23 01:34 08/04/23 03:09 08/04/23 05:33 08/04/23 07:39  Glucose-Capillary 70 - 99 mg/dL 66 (L) 64 (L) 161 (H) 98 139 (H)   Review of Glycemic Control  Diabetes history: DM2 Outpatient Diabetes medications: Lantus  10 units daily, Novolog  sliding scale with meals Current orders for Inpatient glycemic control: None; CBG Q4H  Inpatient Diabetes Program Recommendations:    Insulin : Please consider ordering Novolog  0-9 units Q4H.  NOTE: Patient admitted with hypokalemia, hydronephrosis, urinary retention, hypertensive urgency, and chronic pain. Per H&P, "patient also complained of glucometer reading " too high ", so she took 8 units of insulin  x 3 with 3-hour intervals.  Blood glucose level dropped to 53."   CBG 139 mg/dl today at 0:96 am.   Thanks, Beacher Limerick, RN, MSN, CDCES Diabetes Coordinator Inpatient Diabetes Program [phone] (Team Pager from 8am to 5pm)

## 2023-08-05 LAB — LEVETIRACETAM LEVEL: Levetiracetam Lvl: 47.7 ug/mL — ABNORMAL HIGH (ref 10.0–40.0)

## 2023-08-18 ENCOUNTER — Emergency Department (HOSPITAL_COMMUNITY)

## 2023-08-18 ENCOUNTER — Inpatient Hospital Stay (HOSPITAL_COMMUNITY)
Admission: EM | Admit: 2023-08-18 | Discharge: 2023-08-22 | DRG: 521 | Disposition: A | Discharge status: Home Health | Attending: Student | Admitting: Student

## 2023-08-18 ENCOUNTER — Other Ambulatory Visit: Payer: Self-pay

## 2023-08-18 ENCOUNTER — Encounter (HOSPITAL_COMMUNITY): Payer: Self-pay | Admitting: Emergency Medicine

## 2023-08-18 DIAGNOSIS — Z79899 Other long term (current) drug therapy: Secondary | ICD-10-CM

## 2023-08-18 DIAGNOSIS — I10 Essential (primary) hypertension: Secondary | ICD-10-CM | POA: Diagnosis present

## 2023-08-18 DIAGNOSIS — G40909 Epilepsy, unspecified, not intractable, without status epilepticus: Secondary | ICD-10-CM | POA: Diagnosis present

## 2023-08-18 DIAGNOSIS — Z7985 Long-term (current) use of injectable non-insulin antidiabetic drugs: Secondary | ICD-10-CM

## 2023-08-18 DIAGNOSIS — R739 Hyperglycemia, unspecified: Secondary | ICD-10-CM | POA: Diagnosis not present

## 2023-08-18 DIAGNOSIS — E8809 Other disorders of plasma-protein metabolism, not elsewhere classified: Secondary | ICD-10-CM | POA: Diagnosis present

## 2023-08-18 DIAGNOSIS — Z8673 Personal history of transient ischemic attack (TIA), and cerebral infarction without residual deficits: Secondary | ICD-10-CM | POA: Diagnosis not present

## 2023-08-18 DIAGNOSIS — M545 Low back pain, unspecified: Secondary | ICD-10-CM | POA: Diagnosis not present

## 2023-08-18 DIAGNOSIS — E1165 Type 2 diabetes mellitus with hyperglycemia: Secondary | ICD-10-CM | POA: Diagnosis present

## 2023-08-18 DIAGNOSIS — R059 Cough, unspecified: Secondary | ICD-10-CM | POA: Diagnosis not present

## 2023-08-18 DIAGNOSIS — Z90710 Acquired absence of both cervix and uterus: Secondary | ICD-10-CM

## 2023-08-18 DIAGNOSIS — Z881 Allergy status to other antibiotic agents status: Secondary | ICD-10-CM

## 2023-08-18 DIAGNOSIS — J9811 Atelectasis: Secondary | ICD-10-CM | POA: Diagnosis not present

## 2023-08-18 DIAGNOSIS — R569 Unspecified convulsions: Secondary | ICD-10-CM | POA: Diagnosis not present

## 2023-08-18 DIAGNOSIS — G4700 Insomnia, unspecified: Secondary | ICD-10-CM | POA: Diagnosis present

## 2023-08-18 DIAGNOSIS — Z794 Long term (current) use of insulin: Secondary | ICD-10-CM

## 2023-08-18 DIAGNOSIS — I160 Hypertensive urgency: Secondary | ICD-10-CM | POA: Diagnosis present

## 2023-08-18 DIAGNOSIS — W19XXXA Unspecified fall, initial encounter: Secondary | ICD-10-CM | POA: Diagnosis not present

## 2023-08-18 DIAGNOSIS — Z7984 Long term (current) use of oral hypoglycemic drugs: Secondary | ICD-10-CM

## 2023-08-18 DIAGNOSIS — D72829 Elevated white blood cell count, unspecified: Secondary | ICD-10-CM | POA: Diagnosis present

## 2023-08-18 DIAGNOSIS — J441 Chronic obstructive pulmonary disease with (acute) exacerbation: Secondary | ICD-10-CM | POA: Diagnosis present

## 2023-08-18 DIAGNOSIS — K219 Gastro-esophageal reflux disease without esophagitis: Secondary | ICD-10-CM | POA: Diagnosis present

## 2023-08-18 DIAGNOSIS — E441 Mild protein-calorie malnutrition: Secondary | ICD-10-CM | POA: Diagnosis not present

## 2023-08-18 DIAGNOSIS — Z7983 Long term (current) use of bisphosphonates: Secondary | ICD-10-CM | POA: Diagnosis not present

## 2023-08-18 DIAGNOSIS — S72091A Other fracture of head and neck of right femur, initial encounter for closed fracture: Secondary | ICD-10-CM | POA: Diagnosis not present

## 2023-08-18 DIAGNOSIS — I517 Cardiomegaly: Secondary | ICD-10-CM | POA: Diagnosis not present

## 2023-08-18 DIAGNOSIS — J449 Chronic obstructive pulmonary disease, unspecified: Secondary | ICD-10-CM | POA: Diagnosis not present

## 2023-08-18 DIAGNOSIS — Z96641 Presence of right artificial hip joint: Secondary | ICD-10-CM | POA: Diagnosis not present

## 2023-08-18 DIAGNOSIS — Z7982 Long term (current) use of aspirin: Secondary | ICD-10-CM

## 2023-08-18 DIAGNOSIS — Y92000 Kitchen of unspecified non-institutional (private) residence as  the place of occurrence of the external cause: Secondary | ICD-10-CM | POA: Diagnosis not present

## 2023-08-18 DIAGNOSIS — J9601 Acute respiratory failure with hypoxia: Secondary | ICD-10-CM | POA: Diagnosis present

## 2023-08-18 DIAGNOSIS — Z885 Allergy status to narcotic agent status: Secondary | ICD-10-CM

## 2023-08-18 DIAGNOSIS — M8580 Other specified disorders of bone density and structure, unspecified site: Secondary | ICD-10-CM | POA: Diagnosis not present

## 2023-08-18 DIAGNOSIS — M25451 Effusion, right hip: Secondary | ICD-10-CM | POA: Diagnosis not present

## 2023-08-18 DIAGNOSIS — E611 Iron deficiency: Secondary | ICD-10-CM | POA: Diagnosis present

## 2023-08-18 DIAGNOSIS — F17210 Nicotine dependence, cigarettes, uncomplicated: Secondary | ICD-10-CM | POA: Diagnosis not present

## 2023-08-18 DIAGNOSIS — S72001A Fracture of unspecified part of neck of right femur, initial encounter for closed fracture: Principal | ICD-10-CM | POA: Diagnosis present

## 2023-08-18 DIAGNOSIS — Z6822 Body mass index (BMI) 22.0-22.9, adult: Secondary | ICD-10-CM

## 2023-08-18 DIAGNOSIS — E46 Unspecified protein-calorie malnutrition: Secondary | ICD-10-CM | POA: Diagnosis present

## 2023-08-18 DIAGNOSIS — E782 Mixed hyperlipidemia: Secondary | ICD-10-CM | POA: Diagnosis present

## 2023-08-18 DIAGNOSIS — E876 Hypokalemia: Secondary | ICD-10-CM | POA: Diagnosis present

## 2023-08-18 DIAGNOSIS — F03918 Unspecified dementia, unspecified severity, with other behavioral disturbance: Secondary | ICD-10-CM | POA: Diagnosis not present

## 2023-08-18 DIAGNOSIS — Z8249 Family history of ischemic heart disease and other diseases of the circulatory system: Secondary | ICD-10-CM

## 2023-08-18 DIAGNOSIS — Z905 Acquired absence of kidney: Secondary | ICD-10-CM

## 2023-08-18 DIAGNOSIS — F418 Other specified anxiety disorders: Secondary | ICD-10-CM | POA: Diagnosis present

## 2023-08-18 DIAGNOSIS — Z888 Allergy status to other drugs, medicaments and biological substances status: Secondary | ICD-10-CM

## 2023-08-18 DIAGNOSIS — Z833 Family history of diabetes mellitus: Secondary | ICD-10-CM | POA: Diagnosis not present

## 2023-08-18 DIAGNOSIS — W1839XA Other fall on same level, initial encounter: Secondary | ICD-10-CM | POA: Diagnosis present

## 2023-08-18 DIAGNOSIS — S72001D Fracture of unspecified part of neck of right femur, subsequent encounter for closed fracture with routine healing: Secondary | ICD-10-CM | POA: Diagnosis not present

## 2023-08-18 DIAGNOSIS — Z043 Encounter for examination and observation following other accident: Secondary | ICD-10-CM | POA: Diagnosis not present

## 2023-08-18 DIAGNOSIS — M47816 Spondylosis without myelopathy or radiculopathy, lumbar region: Secondary | ICD-10-CM | POA: Diagnosis not present

## 2023-08-18 DIAGNOSIS — M48061 Spinal stenosis, lumbar region without neurogenic claudication: Secondary | ICD-10-CM | POA: Diagnosis not present

## 2023-08-18 DIAGNOSIS — M25551 Pain in right hip: Secondary | ICD-10-CM | POA: Diagnosis present

## 2023-08-18 DIAGNOSIS — M549 Dorsalgia, unspecified: Secondary | ICD-10-CM | POA: Diagnosis not present

## 2023-08-18 DIAGNOSIS — Z471 Aftercare following joint replacement surgery: Secondary | ICD-10-CM | POA: Diagnosis not present

## 2023-08-18 DIAGNOSIS — R7989 Other specified abnormal findings of blood chemistry: Secondary | ICD-10-CM | POA: Diagnosis not present

## 2023-08-18 LAB — CBC WITH DIFFERENTIAL/PLATELET
Abs Immature Granulocytes: 0.04 10*3/uL (ref 0.00–0.07)
Basophils Absolute: 0.1 10*3/uL (ref 0.0–0.1)
Basophils Relative: 1 %
Eosinophils Absolute: 0 10*3/uL (ref 0.0–0.5)
Eosinophils Relative: 0 %
HCT: 37.1 % (ref 36.0–46.0)
Hemoglobin: 12.2 g/dL (ref 12.0–15.0)
Immature Granulocytes: 0 %
Lymphocytes Relative: 20 %
Lymphs Abs: 2.1 10*3/uL (ref 0.7–4.0)
MCH: 29.6 pg (ref 26.0–34.0)
MCHC: 32.9 g/dL (ref 30.0–36.0)
MCV: 90 fL (ref 80.0–100.0)
Monocytes Absolute: 0.5 10*3/uL (ref 0.1–1.0)
Monocytes Relative: 5 %
Neutro Abs: 7.5 10*3/uL (ref 1.7–7.7)
Neutrophils Relative %: 74 %
Platelets: 321 10*3/uL (ref 150–400)
RBC: 4.12 MIL/uL (ref 3.87–5.11)
RDW: 16.3 % — ABNORMAL HIGH (ref 11.5–15.5)
WBC: 10.2 10*3/uL (ref 4.0–10.5)
nRBC: 0 % (ref 0.0–0.2)

## 2023-08-18 LAB — COMPREHENSIVE METABOLIC PANEL WITH GFR
ALT: 23 U/L (ref 0–44)
AST: 30 U/L (ref 15–41)
Albumin: 3.4 g/dL — ABNORMAL LOW (ref 3.5–5.0)
Alkaline Phosphatase: 29 U/L — ABNORMAL LOW (ref 38–126)
Anion gap: 6 (ref 5–15)
BUN: 9 mg/dL (ref 8–23)
CO2: 26 mmol/L (ref 22–32)
Calcium: 8.9 mg/dL (ref 8.9–10.3)
Chloride: 102 mmol/L (ref 98–111)
Creatinine, Ser: 0.82 mg/dL (ref 0.44–1.00)
GFR, Estimated: >60 mL/min (ref >60)
Glucose, Bld: 274 mg/dL — ABNORMAL HIGH (ref 70–99)
Potassium: 2.8 mmol/L — ABNORMAL LOW (ref 3.5–5.1)
Sodium: 134 mmol/L — ABNORMAL LOW (ref 135–145)
Total Bilirubin: 0.5 mg/dL (ref 0.0–1.2)
Total Protein: 7 g/dL (ref 6.5–8.1)

## 2023-08-18 LAB — D-DIMER, QUANTITATIVE: D-Dimer, Quant: 2.56 ug{FEU}/mL — ABNORMAL HIGH (ref 0.00–0.50)

## 2023-08-18 LAB — GLUCOSE, CAPILLARY: Glucose-Capillary: 288 mg/dL — ABNORMAL HIGH (ref 70–99)

## 2023-08-18 MED ORDER — LOSARTAN POTASSIUM 50 MG PO TABS
50.0000 mg | ORAL_TABLET | Freq: Every day | ORAL | Status: DC
  Administered 2023-08-19 – 2023-08-22 (×4): 50 mg via ORAL
  Filled 2023-08-18 (×4): qty 1

## 2023-08-18 MED ORDER — INSULIN ASPART 100 UNIT/ML IJ SOLN
0.0000 [IU] | INTRAMUSCULAR | Status: DC
  Administered 2023-08-18: 5 [IU] via SUBCUTANEOUS
  Administered 2023-08-19 (×2): 7 [IU] via SUBCUTANEOUS

## 2023-08-18 MED ORDER — ALBUTEROL SULFATE (2.5 MG/3ML) 0.083% IN NEBU
10.0000 mg/h | INHALATION_SOLUTION | Freq: Once | RESPIRATORY_TRACT | Status: AC
  Administered 2023-08-18: 10 mg/h via RESPIRATORY_TRACT
  Filled 2023-08-18: qty 12

## 2023-08-18 MED ORDER — METHYLPREDNISOLONE SODIUM SUCC 40 MG IJ SOLR
40.0000 mg | Freq: Two times a day (BID) | INTRAMUSCULAR | Status: DC
  Administered 2023-08-18 – 2023-08-22 (×8): 40 mg via INTRAVENOUS
  Filled 2023-08-18 (×8): qty 1

## 2023-08-18 MED ORDER — LEVETIRACETAM 500 MG PO TABS
1000.0000 mg | ORAL_TABLET | Freq: Two times a day (BID) | ORAL | Status: DC
  Administered 2023-08-18 – 2023-08-22 (×8): 1000 mg via ORAL
  Filled 2023-08-18 (×8): qty 2

## 2023-08-18 MED ORDER — PANTOPRAZOLE SODIUM 40 MG PO TBEC
40.0000 mg | DELAYED_RELEASE_TABLET | Freq: Every day | ORAL | Status: DC
  Administered 2023-08-19 – 2023-08-22 (×4): 40 mg via ORAL
  Filled 2023-08-18 (×4): qty 1

## 2023-08-18 MED ORDER — PROCHLORPERAZINE EDISYLATE 10 MG/2ML IJ SOLN: 10.0000 mg | Freq: Four times a day (QID) | INTRAMUSCULAR | Status: DC | PRN

## 2023-08-18 MED ORDER — MORPHINE SULFATE (PF) 2 MG/ML IV SOLN
2.0000 mg | INTRAVENOUS | Status: DC | PRN
  Administered 2023-08-18 – 2023-08-21 (×11): 2 mg via INTRAVENOUS
  Filled 2023-08-18 (×11): qty 1

## 2023-08-18 MED ORDER — MAGNESIUM OXIDE -MG SUPPLEMENT 400 (240 MG) MG PO TABS
800.0000 mg | ORAL_TABLET | Freq: Once | ORAL | Status: AC
  Administered 2023-08-18: 800 mg via ORAL
  Filled 2023-08-18: qty 2

## 2023-08-18 MED ORDER — ACETAMINOPHEN 325 MG PO TABS
650.0000 mg | ORAL_TABLET | Freq: Four times a day (QID) | ORAL | Status: DC | PRN
  Administered 2023-08-20 – 2023-08-21 (×2): 650 mg via ORAL
  Filled 2023-08-18: qty 2

## 2023-08-18 MED ORDER — ALPRAZOLAM 0.5 MG PO TABS
0.5000 mg | ORAL_TABLET | Freq: Every day | ORAL | Status: DC
  Administered 2023-08-18 – 2023-08-21 (×4): 0.5 mg via ORAL
  Filled 2023-08-18 (×4): qty 1

## 2023-08-18 MED ORDER — ONDANSETRON HCL 4 MG/2ML IJ SOLN
4.0000 mg | Freq: Once | INTRAMUSCULAR | Status: AC
  Administered 2023-08-18: 4 mg via INTRAVENOUS
  Filled 2023-08-18: qty 2

## 2023-08-18 MED ORDER — LEVALBUTEROL HCL 0.63 MG/3ML IN NEBU
0.6300 mg | INHALATION_SOLUTION | Freq: Four times a day (QID) | RESPIRATORY_TRACT | Status: DC
  Administered 2023-08-19 (×3): 0.63 mg via RESPIRATORY_TRACT
  Filled 2023-08-18 (×3): qty 3

## 2023-08-18 MED ORDER — MORPHINE SULFATE (PF) 4 MG/ML IV SOLN
4.0000 mg | Freq: Once | INTRAVENOUS | Status: AC
  Administered 2023-08-18: 4 mg via INTRAVENOUS
  Filled 2023-08-18: qty 1

## 2023-08-18 MED ORDER — POTASSIUM CHLORIDE CRYS ER 20 MEQ PO TBCR
40.0000 meq | EXTENDED_RELEASE_TABLET | Freq: Once | ORAL | Status: AC
  Administered 2023-08-18: 40 meq via ORAL
  Filled 2023-08-18: qty 2

## 2023-08-18 MED ORDER — IPRATROPIUM-ALBUTEROL 0.5-2.5 (3) MG/3ML IN SOLN
9.0000 mL | Freq: Once | RESPIRATORY_TRACT | Status: AC
  Administered 2023-08-18: 9 mL via RESPIRATORY_TRACT
  Filled 2023-08-18: qty 9

## 2023-08-18 MED ORDER — DM-GUAIFENESIN ER 30-600 MG PO TB12
1.0000 | ORAL_TABLET | Freq: Two times a day (BID) | ORAL | Status: DC
  Administered 2023-08-18 – 2023-08-22 (×8): 1 via ORAL
  Filled 2023-08-18 (×8): qty 1

## 2023-08-18 MED ORDER — ACETAMINOPHEN 650 MG RE SUPP
650.0000 mg | Freq: Four times a day (QID) | RECTAL | Status: DC | PRN
Start: 2023-08-18 — End: 2023-08-21

## 2023-08-18 MED ORDER — ROSUVASTATIN CALCIUM 20 MG PO TABS
20.0000 mg | ORAL_TABLET | Freq: Every day | ORAL | Status: DC
  Administered 2023-08-19 – 2023-08-22 (×4): 20 mg via ORAL
  Filled 2023-08-18 (×4): qty 1

## 2023-08-18 MED ORDER — GLUCERNA SHAKE PO LIQD
237.0000 mL | Freq: Three times a day (TID) | ORAL | Status: DC
  Administered 2023-08-21 – 2023-08-22 (×4): 237 mL via ORAL

## 2023-08-18 MED ORDER — HYDRALAZINE HCL 20 MG/ML IJ SOLN
5.0000 mg | Freq: Once | INTRAMUSCULAR | Status: AC
  Administered 2023-08-18: 5 mg via INTRAVENOUS
  Filled 2023-08-18: qty 1

## 2023-08-18 NOTE — H&P (Signed)
 History and Physical    Patient: Veronica Moses ZOX:[id number] DOB: October 02, 1955 DOA: 08/18/2023 DOS: the patient was seen and examined on 08/18/2023 PCP: Wendi Ham, NP  Patient coming from: Home  Chief Complaint:  Chief Complaint  Patient presents with   Fall   HPI: Veronica Moses is a 68 y.o. female with medical history significant of COPD, diabetes mellitus, seizure disorder, tobacco abuse who presents to the emergency department due to fall sustained at home last night.  She states that she lost balance after being bumped into by her dog.  She thought it was just a bruise since she was able to get up, but on waking up this morning, she had increased pain and had difficulty in being able to walk, so EMS was activated.  On arrival of EMS then, 200 mcg of fentanyl  was given and she was taken to the ED for further evaluation. She was recently admitted from 5/19 to 5/20 due to severe electrolyte abnormalities with hypokalemia and hypomagnesemia along with hypoglycemia.  Hypertensive emergency demonstrated with repeated doses of hydralazine .  ED Course:  In the emergency department, BP was elevated at 216/61, but other vital signs were within within normal range.  She was also being in the ED, patient became hypoxic with an O2 sat of 89% and was provided with supplemental oxygen  at 6 LPM.  Workup in the ED showed normal CBC and BMP except for sodium of 134, potassium 2.8 blood glucose of 274, albumin 3.4 Right hip x-ray showed impacted right femoral neck fracture CT of right hip without contrast showed mildly impacted transverse fracture of the right femoral neck Breathing treatment was provided, IV morphine  4 mg x 1 was given, potassium was replenished and IV hydralazine  was provided.  [facility] was asked to admit patient Orthopedic surgeon (Dr. Ernesta Heading) was consulted and will follow-up with patient in the morning  Review of Systems: Review of systems as noted in the HPI. All other systems reviewed and are  negative.   Past Medical History:  Diagnosis Date   Anxiety    Closed fracture of right distal radius    COPD (chronic obstructive pulmonary disease) (HCC)    Depression    Diabetes mellitus    Insulin  Dependent. Adult onset   Dyspnea    with exertion   Heart murmur    Hypertension    Memory changes    since being on vent June 2017- "coming back some."   Seizures (HCC) 08/2015   last seizure 07-2019   Stroke [facility]) 2016   remote, no deficits   UTI (lower urinary tract infection)    Past Surgical History:  Procedure Laterality Date   ABDOMINAL HYSTERECTOMY     BREAST SURGERY Left    cyst-   CHOLECYSTECTOMY     COLONOSCOPY WITH ESOPHAGOGASTRODUODENOSCOPY (EGD) N/A 03/04/2013   Procedure: COLONOSCOPY WITH ESOPHAGOGASTRODUODENOSCOPY (EGD);  Surgeon: Ruby Corporal, MD;  Location: [facility];  Service: Endoscopy;  Laterality: N/A;  1200-moved to 1535 Ann notified pt   MULTIPLE EXTRACTIONS WITH ALVEOLOPLASTY N/A 05/30/2016   Procedure: MULTIPLE EXTRACTION WITH ALVEOLOPLASTY;  Surgeon: Ascencion Lava, DDS;  Location: [facility] OR;  Service: Oral Surgery;  Laterality: N/A;   NEPHRECTOMY Left    Donatated to brother   OPEN REDUCTION INTERNAL FIXATION (ORIF) DISTAL RADIAL FRACTURE Right 03/22/2020   Procedure: OPEN REDUCTION INTERNAL FIXATION (ORIF) DISTAL RADIAL FRACTURE;  Surgeon: Lyanne Sample, MD;  Location: [facility];  Service: Orthopedics;  Laterality: Right;  AXILLARY BLOCK  Social History:  reports that she has been smoking cigarettes. She started smoking about 34 years ago. She has a 26 pack-year smoking history. She has never used smokeless tobacco. She reports that she does not drink alcohol and does not use drugs.   Allergies  Allergen Reactions   Demerol Other (See Comments)    hallucinations   Depakote  [Divalproex  Sodium] Other (See Comments)    Sore/blisters   Dilaudid  [Hydromorphone  Hcl] Nausea And Vomiting   Doxycycline  Other (See Comments)     "Blistered"   Valproic Acid  Rash    Family History  Problem Relation Age of Onset   Diabetes Father    CAD Other        family history   Diabetes Brother    Colon cancer Neg Hx      Prior to Admission medications   Medication Sig Start Date End Date Taking? Authorizing Provider  acetaminophen  (TYLENOL ) 325 MG tablet Take 325 mg by mouth every 6 (six) hours as needed for mild pain or moderate pain.   Yes [provider]  ALPRAZolam  (XANAX ) 0.5 MG tablet Take 0.5 mg by mouth at bedtime. Take 1 tablet if needed daily and 2 tablets every evening 02/17/20  Yes [provider]  aspirin  81 MG chewable tablet Chew 81 mg by mouth daily.   Yes [provider]  levETIRAcetam  (KEPPRA ) 1000 MG tablet Take 1 tablet (1,000 mg total) by mouth 2 (two) times daily. 06/04/23 05/29/24 Yes Wess Hammed, NP  alendronate (FOSAMAX) 70 MG tablet Take 70 mg by mouth once a week. 05/06/23   [provider]  BELSOMRA 20 MG TABS Take 1 tablet by mouth at bedtime as needed. 07/22/23   [provider]  cephALEXin  (KEFLEX ) 500 MG capsule Take 500 mg by mouth 2 (two) times daily. Patient not taking: Reported on 08/04/2023 07/31/23   [provider]  ciprofloxacin (CIPRO) 250 MG tablet Take 250 mg by mouth 2 (two) times daily. 08/03/23   [provider]  diphenhydrAMINE (BENADRYL) 25 mg capsule Take 50 mg by mouth every 6 (six) hours as needed for sleep.    [provider]  diphenhydrAMINE HCl, Sleep, (ZZZQUIL PO) Take 2 tablets by mouth at bedtime.    [provider]  donepezil  (ARICEPT ) 10 MG tablet Take 1 tablet (10 mg total) by mouth at bedtime. 06/10/23   Wess Hammed, NP  escitalopram  (LEXAPRO ) 20 MG tablet Take 20 mg by mouth daily.    [provider]  FARXIGA 10 MG TABS tablet Take 10 mg by mouth daily. Patient not taking: Reported on 08/04/2023 02/03/23   [provider]  gabapentin  (NEURONTIN ) 300 MG capsule Take 1  capsule by mouth daily. 07/27/23   [provider]  glipiZIDE (GLUCOTROL XL) 5 MG 24 hr tablet TAKE 1 TABLET BY MOUTH EVERY DAY FOR 90 DAYS 07/17/23   [provider]  hydrochlorothiazide  (MICROZIDE ) 12.5 MG capsule Take 1 capsule (12.5 mg total) by mouth daily. 12/04/21   Verlyn Goad, MD  insulin  glargine (LANTUS ) 100 UNIT/ML Solostar Pen Inject 10 Units into the skin daily. Patient taking differently: Inject 20 Units into the skin daily. 11/30/21   Verlyn Goad, MD  losartan  (COZAAR ) 50 MG tablet Take 1 tablet (50 mg total) by mouth daily. 12/04/21   Verlyn Goad, MD  Melatonin 10 MG TABS Take 2 tablets by mouth at bedtime.    [provider]  mirtazapine (REMERON) 7.5 MG tablet Take 1  tablet every day by oral route for 30 days. 07/30/23   [provider]  NOVOLOG  FLEXPEN 100 UNIT/ML FlexPen Please use the hospital sliding scale provided for you Patient taking differently: Please use the hospital sliding scale provided for you take with each meal 08/25/15   Mariea Shook, MD  potassium chloride  (KLOR-CON ) 10 MEQ tablet Take 10 mEq by mouth daily. 06/18/23   [provider]  Pseudoeph-Doxylamine-DM-APAP (NYQUIL PO) Take 2 tablets by mouth at bedtime.    [provider]  rosuvastatin (CRESTOR) 20 MG tablet Take 20 mg by mouth daily. 09/19/21   [provider]  Semaglutide,0.25 or 0.5MG /DOS, (OZEMPIC, 0.25 OR 0.5 MG/DOSE,) 2 MG/3ML SOPN INJECT 0.25 MG INTO THE SKIN FOR 4 WEEKS, THEN INJECT 0.5 MG INTO THE SKIN ONCE WEEKLY. 07/30/23   [provider]    Physical Exam: BP (!) 168/51   Pulse (!) 106   Temp 99.5 F (37.5 C) (Oral)   Resp 20   Ht 5\' 1"  (1.549 m)   Wt 58 kg   SpO2 96%   BMI 24.16 kg/m   General: 68 y.o. year-old female well developed well nourished in no acute distress.  Alert and oriented x3. HEENT: NCAT, EOMI Neck: Supple, trachea medial Cardiovascular: Regular rate and rhythm with no rubs or gallops.   No thyromegaly or JVD noted.  No lower extremity edema. 2/4 pulses in all 4 extremities. Respiratory: Diffuse coarse breath sounds with expiratory wheezes on auscultation  Abdomen: Soft, nontender nondistended with normal bowel sounds x4 quadrants. Muskuloskeletal: Tender to palpation of right hip.  Restricted ROM of RLE due to pain.  Neuro: CN II-XII intact, strength 5/5 x 4, sensation, reflexes intact Skin: No ulcerative lesions noted or rashes Psychiatry: Mood is appropriate for condition and setting          Labs on Admission:  Basic Metabolic Panel: Recent Labs  Lab 08/18/23 1911  NA 134*  K 2.8*  CL 102  CO2 26  GLUCOSE 274*  BUN 9  CREATININE 0.82  CALCIUM 8.9   Liver Function Tests: Recent Labs  Lab 08/18/23 1911  AST 30  ALT 23  ALKPHOS 29*  BILITOT 0.5  PROT 7.0  ALBUMIN 3.4*   No results for input(s): "LIPASE", "AMYLASE" in the last 168 hours. No results for input(s): "AMMONIA" in the last 168 hours. CBC: Recent Labs  Lab 08/18/23 1911  WBC 10.2  NEUTROABS 7.5  HGB 12.2  HCT 37.1  MCV 90.0  PLT 321   Cardiac Enzymes: No results for input(s): "CKTOTAL", "CKMB", "CKMBINDEX", "TROPONINI" in the last 168 hours.  BNP (last 3 results) No results for input(s): "BNP" in the last 8760 hours.  ProBNP (last 3 results) No results for input(s): "PROBNP" in the last 8760 hours.  CBG: No results for input(s): "GLUCAP" in the last 168 hours.  Radiological Exams on Admission: CT Hip Right Wo Contrast Result Date: 08/18/2023 CLINICAL DATA:  Trauma. EXAM: CT OF THE RIGHT HIP WITHOUT CONTRAST TECHNIQUE: Multidetector CT imaging of the right hip was performed according to the standard protocol. Multiplanar CT image reconstructions were also generated. RADIATION DOSE REDUCTION: This exam was performed according to the departmental dose-optimization program which includes automated exposure control, adjustment of the mA and/or kV according to patient size and/or use  of iterative reconstruction technique. COMPARISON:  X-ray same day FINDINGS: Bones/Joint/Cartilage The bones are osteopenic. There is a mildly impacted transverse fracture of the right femoral neck. There is no dislocation. Joint effusion is  present. Ligaments Suboptimally assessed by CT. Muscles and Tendons No focal hematoma. Soft tissues No focal hematoma or fluid collection. IMPRESSION: Mildly impacted transverse fracture of the right femoral neck. Electronically Signed   By: Tyron Gallon M.D.   On: 08/18/2023 21:33   CT Lumbar Spine Wo Contrast Result Date: 08/18/2023 CLINICAL DATA:  Low back pain EXAM: CT LUMBAR SPINE WITHOUT CONTRAST TECHNIQUE: Multidetector CT imaging of the lumbar spine was performed without intravenous contrast administration. Multiplanar CT image reconstructions were also generated. RADIATION DOSE REDUCTION: This exam was performed according to the departmental dose-optimization program which includes automated exposure control, adjustment of the mA and/or kV according to patient size and/or use of iterative reconstruction technique. COMPARISON:  None Available. FINDINGS: Segmentation: 5 lumbar type vertebrae. Alignment: Normal. Vertebrae: No acute fracture or focal pathologic process. Paraspinal and other soft tissues: Negative. Surgical changes of left nephrectomy noted. Disc levels: Intervertebral disc heights are preserved. No significant neuroforaminal narrowing. Mild to moderate facet arthrosis, most severe at L2-3 and L4-5. Mild multifactorial central canal stenosis at L2-L5,. The osseous structures are diffusely osteopenic IMPRESSION: 1. No acute osseous abnormality of the lumbar spine. 2. Mild to moderate facet arthrosis, most severe at L2-3 and L4-5. 3. Mild multifactorial central canal stenosis at L2-L5. Electronically Signed   By: Worthy Heads M.D.   On: 08/18/2023 20:29   DG Chest Portable 1 View Result Date: 08/18/2023 CLINICAL DATA:  Status post fall, right hip  fracture. EXAM: PORTABLE CHEST 1 VIEW COMPARISON:  08/03/2023 FINDINGS: Stable cardiomegaly. Unchanged mediastinal contours. No acute airspace disease, pulmonary edema, large pleural effusion or pneumothorax. On limited assessment, no acute osseous findings. IMPRESSION: Stable cardiomegaly. No acute findings. Electronically Signed   By: Chadwick Colonel M.D.   On: 08/18/2023 19:35   DG Hip Unilat W or Wo Pelvis 2-3 Views Right Result Date: 08/18/2023 CLINICAL DATA:  Right hip pain after fall.  Unable to bear weight. EXAM: DG HIP (WITH OR WITHOUT PELVIS) 2-3V RIGHT COMPARISON:  None Available. FINDINGS: Impacted right femoral neck fracture. No significant displacement. The femoral head remains seated. The bones are diffusely under mineralized. Pubic rami are intact. Pubic symphysis and sacroiliac joints are congruent. IMPRESSION: Impacted right femoral neck fracture. Electronically Signed   By: Chadwick Colonel M.D.   On: 08/18/2023 19:34    EKG: I independently viewed the EKG done and my findings are as followed: EKG was not done in the ED  Assessment/Plan Present on Admission:  Fracture of femoral neck, right (HCC)  Acute exacerbation of chronic obstructive pulmonary disease (COPD) (HCC)  Hypertensive urgency  Essential hypertension  Hypoalbuminemia due to protein-calorie malnutrition (HCC)  Principal Problem:   Fracture of femoral neck, right (HCC) Active Problems:   Acute exacerbation of chronic obstructive pulmonary disease (COPD) (HCC)   Seizure (HCC)   Hypoalbuminemia due to protein-calorie malnutrition (HCC)   Hypertensive urgency   Essential hypertension   Acute respiratory failure with hypoxia (HCC)   Type 2 diabetes mellitus with hyperglycemia (HCC)   Insomnia   Mixed hyperlipidemia  Fracture of femoral neck, right CT of right hip without contrast showed mildly impacted transverse fracture of the right femoral neck Continue IV morphine  2 mg every 3 hours as needed Continue  fall precaution Orthopedic surgeon (Dr. Ernesta Heading) was already consulted and will follow-up with patient in the morning  Acute exacerbation of COPD Acute respiratory failure with hypoxia Continue Xopenex, Mucinex, Solu-Medrol . Continue Protonix  to prevent steroid-induced ulcer Continue incentive spirometry and flutter valve Continue  supplemental oxygen  to maintain O2 sat > 94% with plan to wean patient off oxygen  as tolerated  Hypertensive urgency Essential hypertension Continue IV hydralazine  10 mg every 6 hours for SBP > 170 Continue losartan   Type 2 diabetes mellitus with hyperglycemia Globin A1c on 08/03/2023 was 8.9 Continue ISS with hypoglycemic protocol when blood glucose   Hypoalbuminemia possible secondary to mild protein calorie malnutrition Albumin 3.4, protein supplement will be provided  Seizure Continue Keppra   Insomnia Continue home Xanax   Mixed hyperlipidemia Continue Crestor  DVT prophylaxis: SCDs  Code Status: Full code  Family Communication: None at bedside  Consults: Orthopedic surgeon (Dr. Ernesta Heading)  Severity of Illness: The appropriate patient status for this patient is INPATIENT. Inpatient status is judged to be reasonable and necessary in order to provide the required intensity of service to ensure the patient's safety. The patient's presenting symptoms, physical exam findings, and initial radiographic and laboratory data in the context of their chronic comorbidities is felt to place them at high risk for further clinical deterioration. Furthermore, it is not anticipated that the patient will be medically stable for discharge from the hospital within 2 midnights of admission.   * I certify that at the point of admission it is my clinical judgment that the patient will require inpatient hospital care spanning beyond 2 midnights from the point of admission due to high intensity of service, high risk for further deterioration and high frequency of surveillance  required.*  Author: Sheilyn Boehlke, DO 08/18/2023 11:26 PM  For on call review [email or web address].

## 2023-08-18 NOTE — ED Notes (Addendum)
 Veronica Moses

## 2023-08-18 NOTE — ED Notes (Signed)
 Assessment of the right leg show shorting with outer rotation.

## 2023-08-18 NOTE — ED Provider Notes (Signed)
 [place] [facility]ERGENCY DEPARTMENT AT [facility] Provider Note  CSN: [id number] Arrival date & time: 08/18/23 1810  Chief Complaint(s) Fall  HPI Veronica Moses is a 68 y.o. female ***   Past Medical History Past Medical History:  Diagnosis Date   Anxiety    Closed fracture of right distal radius    COPD (chronic obstructive pulmonary disease) (HCC)    Depression    Diabetes mellitus    Insulin  Dependent. Adult onset   Dyspnea    with exertion   Heart murmur    Hypertension    Memory changes    since being on vent June 2017- "coming back some."   Seizures (HCC) 08/2015   last seizure 07-2019   Stroke [facility]) 2016   remote, no deficits   UTI (lower urinary tract infection)    Patient Active Problem List   Diagnosis Date Noted   Hypomagnesemia 08/04/2023   Type 2 diabetes mellitus with hypoglycemia (HCC) 08/04/2023   Hypoalbuminemia due to protein-calorie malnutrition (HCC) 08/04/2023   Prolonged QT interval 08/04/2023   Acute urinary retention 08/04/2023   Transaminitis 08/04/2023   Abdominal pain 08/04/2023   Diarrhea 08/04/2023   Hypertensive urgency 08/04/2023   Essential hypertension 08/04/2023   Dementia (HCC) 06/04/2023   Urinary incontinence 05/01/2022   History of malignant neoplasm of skin 12/23/2021   Hypercholesterolemia 12/23/2021   Hydronephrosis of right kidney 12/23/2021   Heart disease 12/23/2021   AKI (acute kidney injury) (HCC) 11/28/2021   MDD (major depressive disorder), recurrent episode, moderate (HCC) 10/29/2016   Rash 09/06/2015   RMSF [facility] spotted fever) 09/06/2015   Hypokalemia    Psychosocial stressors    Acute encephalopathy    Insulin  dependent diabetes mellitus    HCAP (healthcare-associated pneumonia) 08/27/2015   Status epilepticus (HCC) 08/20/2015   Seizure (HCC) 08/20/2015   DKA (diabetic ketoacidosis) (HCC) 08/20/2015   Fall 05/07/2014   History of sprained wrist 05/07/2014   Protein-calorie malnutrition,  severe (HCC) 10/25/2013   Numbness, limb 10/24/2013   Chest pain 05/30/2013   Diabetes (HCC) 02/08/2013   COPD (chronic obstructive pulmonary disease) (HCC) 02/08/2013   GERD (gastroesophageal reflux disease) 02/08/2013   Abdominal pain, chronic, epigastric 02/08/2013   Home Medication(s) Prior to Admission medications   Medication Sig Start Date End Date Taking? Authorizing Provider  acetaminophen  (TYLENOL ) 325 MG tablet Take 325 mg by mouth every 6 (six) hours as needed for mild pain or moderate pain.    [provider]  alendronate (FOSAMAX) 70 MG tablet Take 70 mg by mouth once a week. 05/06/23   [provider]  ALPRAZolam  (XANAX ) 0.5 MG tablet Take 0.5 mg by mouth 2 (two) times daily as needed for anxiety. Take 1 tablet if needed daily and 2 tablets every evening 02/17/20   [provider]  aspirin  81 MG chewable tablet Chew 81 mg by mouth daily.    [provider]  BELSOMRA 20 MG TABS Take 1 tablet by mouth at bedtime as needed. 07/22/23   [provider]  cephALEXin  (KEFLEX ) 500 MG capsule Take 500 mg by mouth 2 (two) times daily. Patient not taking: Reported on 08/04/2023 07/31/23   [provider]  ciprofloxacin (CIPRO) 250 MG tablet Take 250 mg by mouth 2 (two) times daily. 08/03/23   [provider]  diphenhydrAMINE (BENADRYL) 25 mg capsule Take 50 mg by mouth every 6 (six) hours as needed for sleep.    [provider]  diphenhydrAMINE HCl, Sleep, (ZZZQUIL PO)  Take 2 tablets by mouth at bedtime.    [provider]  donepezil  (ARICEPT ) 10 MG tablet Take 1 tablet (10 mg total) by mouth at bedtime. 06/10/23   Wess Hammed, NP  escitalopram  (LEXAPRO ) 20 MG tablet Take 20 mg by mouth daily.    [provider]  FARXIGA 10 MG TABS tablet Take 10 mg by mouth daily. Patient not taking: Reported on 08/04/2023 02/03/23   [provider]  gabapentin  (NEURONTIN ) 300 MG capsule Take 1 capsule by mouth  daily. 07/27/23   [provider]  glipiZIDE (GLUCOTROL XL) 5 MG 24 hr tablet TAKE 1 TABLET BY MOUTH EVERY DAY FOR 90 DAYS 07/17/23   [provider]  hydrochlorothiazide  (MICROZIDE ) 12.5 MG capsule Take 1 capsule (12.5 mg total) by mouth daily. 12/04/21   Verlyn Goad, MD  insulin  glargine (LANTUS ) 100 UNIT/ML Solostar Pen Inject 10 Units into the skin daily. Patient taking differently: Inject 20 Units into the skin daily. 11/30/21   Verlyn Goad, MD  levETIRAcetam  (KEPPRA ) 1000 MG tablet Take 1 tablet (1,000 mg total) by mouth 2 (two) times daily. 06/04/23 05/29/24  Wess Hammed, NP  losartan  (COZAAR ) 50 MG tablet Take 1 tablet (50 mg total) by mouth daily. 12/04/21   Verlyn Goad, MD  Melatonin 10 MG TABS Take 2 tablets by mouth at bedtime.    [provider]  mirtazapine (REMERON) 7.5 MG tablet Take 1 tablet every day by oral route for 30 days. 07/30/23   [provider]  NOVOLOG  FLEXPEN 100 UNIT/ML FlexPen Please use the hospital sliding scale provided for you Patient taking differently: Please use the hospital sliding scale provided for you take with each meal 08/25/15   Mariea Shook, MD  potassium chloride  (KLOR-CON ) 10 MEQ tablet Take 10 mEq by mouth daily. 06/18/23   [provider]  Pseudoeph-Doxylamine-DM-APAP (NYQUIL PO) Take 2 tablets by mouth at bedtime.    [provider]  rosuvastatin (CRESTOR) 20 MG tablet Take 20 mg by mouth daily. 09/19/21   [provider]  Semaglutide,0.25 or 0.5MG /DOS, (OZEMPIC, 0.25 OR 0.5 MG/DOSE,) 2 MG/3ML SOPN INJECT 0.25 MG INTO THE SKIN FOR 4 WEEKS, THEN INJECT 0.5 MG INTO THE SKIN ONCE WEEKLY. 07/30/23   [provider]                                                                                                                                    Past Surgical History Past Surgical History:  Procedure Laterality Date   ABDOMINAL HYSTERECTOMY     BREAST SURGERY Left    cyst-    CHOLECYSTECTOMY     COLONOSCOPY WITH ESOPHAGOGASTRODUODENOSCOPY (EGD) N/A 03/04/2013   Procedure: COLONOSCOPY WITH ESOPHAGOGASTRODUODENOSCOPY (EGD);  Surgeon: Ruby Corporal, MD;  Location: [facility];  Service: Endoscopy;  Laterality: N/A;  1200-moved to 1535 Ann notified pt   MULTIPLE EXTRACTIONS WITH ALVEOLOPLASTY N/A 05/30/2016  Procedure: MULTIPLE EXTRACTION WITH ALVEOLOPLASTY;  Surgeon: Ascencion Lava, DDS;  Location: [facility] OR;  Service: Oral Surgery;  Laterality: N/A;   NEPHRECTOMY Left    Donatated to brother   OPEN REDUCTION INTERNAL FIXATION (ORIF) DISTAL RADIAL FRACTURE Right 03/22/2020   Procedure: OPEN REDUCTION INTERNAL FIXATION (ORIF) DISTAL RADIAL FRACTURE;  Surgeon: Lyanne Sample, MD;  Location: [facility];  Service: Orthopedics;  Laterality: Right;  AXILLARY BLOCK   Family History Family History  Problem Relation Age of Onset   Diabetes Father    CAD Other        family history   Diabetes Brother    Colon cancer Neg Hx     Social History Social History   Tobacco Use   Smoking status: Every Day    Current packs/day: 0.00    Average packs/day: 1 pack/day for 26.0 years (26.0 ttl pk-yrs)    Types: Cigarettes    Start date: 08/26/1989    Last attempt to quit: 08/27/2015    Years since quitting: 7.9   Smokeless tobacco: Never   Tobacco comments:    smoked this am    Substance Use Topics   Alcohol use: No   Drug use: No   Allergies Demerol, Depakote  [divalproex  sodium], Dilaudid  [hydromorphone  hcl], Doxycycline , and Valproic acid   Review of Systems Review of Systems *** Physical Exam Vital Signs  I have reviewed the triage vital signs BP (!) 228/59   Pulse 72   Temp 98.7 F (37.1 C) (Oral)   Resp 18   Ht 5\' 1"  (1.549 m)   Wt 58 kg   SpO2 93%   BMI 24.16 kg/m  *** Physical Exam  ED Results and Treatments Labs (all labs ordered are listed, but only abnormal results are displayed) Labs Reviewed  COMPREHENSIVE METABOLIC PANEL WITH GFR -  Abnormal; Notable for the following components:      Result Value   Sodium 134 (*)    Potassium 2.8 (*)    Glucose, Bld 274 (*)    Albumin 3.4 (*)    Alkaline Phosphatase 29 (*)    All other components within normal limits  CBC WITH DIFFERENTIAL/PLATELET - Abnormal; Notable for the following components:   RDW 16.3 (*)    All other components within normal limits                                                                                                                          Radiology DG Chest Portable 1 View Result Date: 08/18/2023 CLINICAL DATA:  Status post fall, right hip fracture. EXAM: PORTABLE CHEST 1 VIEW COMPARISON:  08/03/2023 FINDINGS: Stable cardiomegaly. Unchanged mediastinal contours. No acute airspace disease, pulmonary edema, large pleural effusion or pneumothorax. On limited assessment, no acute osseous findings. IMPRESSION: Stable cardiomegaly. No acute findings. Electronically Signed   By: Chadwick Colonel M.D.   On: 08/18/2023 19:35   DG Hip Unilat W or Wo Pelvis 2-3 Views Right Result Date: 08/18/2023 CLINICAL DATA:  Right hip pain after fall.  Unable to bear weight. EXAM: DG HIP (WITH OR WITHOUT PELVIS) 2-3V RIGHT COMPARISON:  None Available. FINDINGS: Impacted right femoral neck fracture. No significant displacement. The femoral head remains seated. The bones are diffusely under mineralized. Pubic rami are intact. Pubic symphysis and sacroiliac joints are congruent. IMPRESSION: Impacted right femoral neck fracture. Electronically Signed   By: Chadwick Colonel M.D.   On: 08/18/2023 19:34    Pertinent labs & imaging results that were available during my care of the patient were reviewed by me and considered in my medical decision making (see MDM for details).  Medications Ordered in ED Medications  ipratropium-albuterol  (DUONEB) 0.5-2.5 (3) MG/3ML nebulizer solution 9 mL (has no administration in time range)  potassium chloride  SA (KLOR-CON  M) CR tablet 40 mEq (has  no administration in time range)  magnesium  oxide (MAG-OX) tablet 800 mg (has no administration in time range)  morphine  (PF) 4 MG/ML injection 4 mg (4 mg Intravenous Given 08/18/23 1925)  ondansetron  (ZOFRAN ) injection 4 mg (4 mg Intravenous Given 08/18/23 1926)                                                                                                                                     Procedures Procedures  (including critical care time)  Medical Decision Making / ED Course   This patient presents to the ED for concern of ***, this involves an extensive number of treatment options, and is a complaint that carries with it a high risk of complications and morbidity.  The differential diagnosis includes ***  MDM: ***   Additional history obtained: -Additional history obtained from *** -External records from outside source obtained and reviewed including: Chart review including previous notes, labs, imaging, consultation notes   Lab Tests: -I ordered, reviewed, and interpreted labs.   The pertinent results include:   Labs Reviewed  COMPREHENSIVE METABOLIC PANEL WITH GFR - Abnormal; Notable for the following components:      Result Value   Sodium 134 (*)    Potassium 2.8 (*)    Glucose, Bld 274 (*)    Albumin 3.4 (*)    Alkaline Phosphatase 29 (*)    All other components within normal limits  CBC WITH DIFFERENTIAL/PLATELET - Abnormal; Notable for the following components:   RDW 16.3 (*)    All other components within normal limits      EKG ***  EKG Interpretation Date/Time:    Ventricular Rate:    PR Interval:    QRS Duration:    QT Interval:    QTC Calculation:   R Axis:      Text Interpretation:           Imaging Studies ordered: I ordered imaging studies including *** I independently visualized and interpreted imaging. I agree with the radiologist interpretation   Medicines ordered and prescription drug management: Meds ordered this encounter   Medications  ipratropium-albuterol  (DUONEB) 0.5-2.5 (3) MG/3ML nebulizer solution 9 mL   morphine  (PF) 4 MG/ML injection 4 mg    Refill:  0   ondansetron  (ZOFRAN ) injection 4 mg   potassium chloride  SA (KLOR-CON  M) CR tablet 40 mEq   magnesium  oxide (MAG-OX) tablet 800 mg    -I have reviewed the patients home medicines and have made adjustments as needed  Critical interventions ***  Consultations Obtained: I requested consultation with the ***,  and discussed lab and imaging findings as well as pertinent plan - they recommend: ***   Cardiac Monitoring: The patient was maintained on a cardiac monitor.  I personally viewed and interpreted the cardiac monitored which showed an underlying rhythm of: ***  Social Determinants of Health:  Factors impacting patients care include: ***   Reevaluation: After the interventions noted above, I reevaluated the patient and found that they have :{resolved/improved/worsened:23923::"improved"}  Co morbidities that complicate the patient evaluation  Past Medical History:  Diagnosis Date   Anxiety    Closed fracture of right distal radius    COPD (chronic obstructive pulmonary disease) (HCC)    Depression    Diabetes mellitus    Insulin  Dependent. Adult onset   Dyspnea    with exertion   Heart murmur    Hypertension    Memory changes    since being on vent June 2017- "coming back some."   Seizures (HCC) 08/2015   last seizure 07-2019   Stroke [facility]) 2016   remote, no deficits   UTI (lower urinary tract infection)       Dispostion: I considered admission for this patient, ***     Final Clinical Impression(s) / ED Diagnoses Final diagnoses:  None     @PCDICTATION @

## 2023-08-18 NOTE — ED Notes (Signed)
 Patient transported to CT

## 2023-08-18 NOTE — ED Triage Notes (Addendum)
 Pt fell at home last night and has not been able to put any weight on the right hip since. Ems gave pt 200mcg of fentanyl .

## 2023-08-19 ENCOUNTER — Inpatient Hospital Stay (HOSPITAL_COMMUNITY)

## 2023-08-19 DIAGNOSIS — I160 Hypertensive urgency: Secondary | ICD-10-CM | POA: Diagnosis not present

## 2023-08-19 DIAGNOSIS — S72001A Fracture of unspecified part of neck of right femur, initial encounter for closed fracture: Secondary | ICD-10-CM | POA: Diagnosis not present

## 2023-08-19 DIAGNOSIS — J441 Chronic obstructive pulmonary disease with (acute) exacerbation: Secondary | ICD-10-CM | POA: Diagnosis not present

## 2023-08-19 DIAGNOSIS — J9601 Acute respiratory failure with hypoxia: Secondary | ICD-10-CM | POA: Diagnosis not present

## 2023-08-19 LAB — BASIC METABOLIC PANEL WITH GFR
Anion gap: 11 (ref 5–15)
BUN: 16 mg/dL (ref 8–23)
CO2: 23 mmol/L (ref 22–32)
Calcium: 8.3 mg/dL — ABNORMAL LOW (ref 8.9–10.3)
Chloride: 94 mmol/L — ABNORMAL LOW (ref 98–111)
Creatinine, Ser: 0.88 mg/dL (ref 0.44–1.00)
GFR, Estimated: >60 mL/min (ref >60)
Glucose, Bld: 542 mg/dL (ref 70–99)
Potassium: 4.1 mmol/L (ref 3.5–5.1)
Sodium: 128 mmol/L — ABNORMAL LOW (ref 135–145)

## 2023-08-19 LAB — CBC
HCT: 40.3 % (ref 36.0–46.0)
Hemoglobin: 12.7 g/dL (ref 12.0–15.0)
MCH: 28.6 pg (ref 26.0–34.0)
MCHC: 31.5 g/dL (ref 30.0–36.0)
MCV: 90.8 fL (ref 80.0–100.0)
Platelets: 282 10*3/uL (ref 150–400)
RBC: 4.44 MIL/uL (ref 3.87–5.11)
RDW: 16.4 % — ABNORMAL HIGH (ref 11.5–15.5)
WBC: 9.6 10*3/uL (ref 4.0–10.5)
nRBC: 0 % (ref 0.0–0.2)

## 2023-08-19 LAB — GLUCOSE, CAPILLARY
Glucose-Capillary: 340 mg/dL — ABNORMAL HIGH (ref 70–99)
Glucose-Capillary: 322 mg/dL — ABNORMAL HIGH (ref 70–99)
Glucose-Capillary: 482 mg/dL — ABNORMAL HIGH (ref 70–99)
Glucose-Capillary: 379 mg/dL — ABNORMAL HIGH (ref 70–99)
Glucose-Capillary: 540 mg/dL (ref 70–99)
Glucose-Capillary: 515 mg/dL (ref 70–99)

## 2023-08-19 LAB — MAGNESIUM
Magnesium: 1.6 mg/dL — ABNORMAL LOW (ref 1.7–2.4)
Magnesium: 2.3 mg/dL (ref 1.7–2.4)

## 2023-08-19 LAB — COMPREHENSIVE METABOLIC PANEL WITH GFR
ALT: 25 U/L (ref 0–44)
AST: 33 U/L (ref 15–41)
Albumin: 3.5 g/dL (ref 3.5–5.0)
Alkaline Phosphatase: 28 U/L — ABNORMAL LOW (ref 38–126)
Anion gap: 8 (ref 5–15)
BUN: 9 mg/dL (ref 8–23)
CO2: 26 mmol/L (ref 22–32)
Calcium: 8.5 mg/dL — ABNORMAL LOW (ref 8.9–10.3)
Chloride: 100 mmol/L (ref 98–111)
Creatinine, Ser: 0.84 mg/dL (ref 0.44–1.00)
GFR, Estimated: >60 mL/min (ref >60)
Glucose, Bld: 332 mg/dL — ABNORMAL HIGH (ref 70–99)
Potassium: 3.7 mmol/L (ref 3.5–5.1)
Sodium: 134 mmol/L — ABNORMAL LOW (ref 135–145)
Total Bilirubin: 0.6 mg/dL (ref 0.0–1.2)
Total Protein: 7.4 g/dL (ref 6.5–8.1)

## 2023-08-19 LAB — PHOSPHORUS: Phosphorus: 2.5 mg/dL (ref 2.5–4.6)

## 2023-08-19 LAB — SURGICAL PCR SCREEN
MRSA, PCR: NEGATIVE
Staphylococcus aureus: NEGATIVE

## 2023-08-19 LAB — TROPONIN I (HIGH SENSITIVITY): Troponin I (High Sensitivity): 12 ng/L (ref <18)

## 2023-08-19 MED ORDER — ORAL CARE MOUTH RINSE: 15.0000 mL | OROMUCOSAL | Status: DC | PRN

## 2023-08-19 MED ORDER — POTASSIUM CHLORIDE CRYS ER 20 MEQ PO TBCR
40.0000 meq | EXTENDED_RELEASE_TABLET | Freq: Once | ORAL | Status: AC
  Administered 2023-08-19: 40 meq via ORAL
  Filled 2023-08-19: qty 2

## 2023-08-19 MED ORDER — INSULIN ASPART 100 UNIT/ML IJ SOLN
25.0000 [IU] | Freq: Once | INTRAMUSCULAR | Status: AC
  Administered 2023-08-19: 25 [IU] via SUBCUTANEOUS

## 2023-08-19 MED ORDER — INSULIN REGULAR(HUMAN) IN NACL 100-0.9 UT/100ML-% IV SOLN
INTRAVENOUS | Status: DC
Start: 2023-08-19 — End: 2023-08-20
  Administered 2023-08-19: 15 [IU]/h via INTRAVENOUS
  Administered 2023-08-20: 3.2 [IU]/h via INTRAVENOUS
  Filled 2023-08-19 (×3): qty 100

## 2023-08-19 MED ORDER — ESCITALOPRAM OXALATE 10 MG PO TABS
20.0000 mg | ORAL_TABLET | Freq: Every day | ORAL | Status: DC
  Administered 2023-08-19 – 2023-08-22 (×4): 20 mg via ORAL
  Filled 2023-08-19 (×4): qty 2

## 2023-08-19 MED ORDER — CHLORHEXIDINE GLUCONATE CLOTH 2 % EX PADS
6.0000 | MEDICATED_PAD | Freq: Every day | CUTANEOUS | Status: DC
  Administered 2023-08-20 – 2023-08-22 (×2): 6 via TOPICAL

## 2023-08-19 MED ORDER — INSULIN REGULAR(HUMAN) IN NACL 100-0.9 UT/100ML-% IV SOLN: INTRAVENOUS | Status: DC

## 2023-08-19 MED ORDER — INSULIN ASPART 100 UNIT/ML IJ SOLN: 0.0000 [IU] | Freq: Every day | INTRAMUSCULAR | Status: DC

## 2023-08-19 MED ORDER — DEXTROSE IN LACTATED RINGERS 5 % IV SOLN: INTRAVENOUS | Status: AC

## 2023-08-19 MED ORDER — TRANEXAMIC ACID-NACL 1000-0.7 MG/100ML-% IV SOLN
1000.0000 mg | INTRAVENOUS | Status: AC
  Administered 2023-08-20: 1000 mg via INTRAVENOUS
  Filled 2023-08-19: qty 100

## 2023-08-19 MED ORDER — BUDESONIDE 0.25 MG/2ML IN SUSP
0.2500 mg | Freq: Two times a day (BID) | RESPIRATORY_TRACT | Status: DC
  Administered 2023-08-19 – 2023-08-22 (×6): 0.25 mg via RESPIRATORY_TRACT
  Filled 2023-08-19 (×6): qty 2

## 2023-08-19 MED ORDER — IOHEXOL 350 MG/ML SOLN
75.0000 mL | Freq: Once | INTRAVENOUS | Status: AC | PRN
  Administered 2023-08-19: 75 mL via INTRAVENOUS

## 2023-08-19 MED ORDER — INSULIN GLARGINE-YFGN 100 UNIT/ML ~~LOC~~ SOLN
10.0000 [IU] | Freq: Every day | SUBCUTANEOUS | Status: DC
  Administered 2023-08-19: 10 [IU] via SUBCUTANEOUS
  Filled 2023-08-19 (×2): qty 0.1

## 2023-08-19 MED ORDER — INSULIN ASPART 100 UNIT/ML IJ SOLN
0.0000 [IU] | Freq: Three times a day (TID) | INTRAMUSCULAR | Status: DC
  Administered 2023-08-19: 15 [IU] via SUBCUTANEOUS

## 2023-08-19 MED ORDER — MAGNESIUM SULFATE 2 GM/50ML IV SOLN
2.0000 g | Freq: Once | INTRAVENOUS | Status: AC
  Administered 2023-08-19: 2 g via INTRAVENOUS
  Filled 2023-08-19: qty 50

## 2023-08-19 MED ORDER — NICOTINE 14 MG/24HR TD PT24
14.0000 mg | MEDICATED_PATCH | Freq: Every day | TRANSDERMAL | Status: DC
  Filled 2023-08-19 (×3): qty 1

## 2023-08-19 MED ORDER — DONEPEZIL HCL 5 MG PO TABS
10.0000 mg | ORAL_TABLET | Freq: Every day | ORAL | Status: DC
  Administered 2023-08-19 – 2023-08-21 (×3): 10 mg via ORAL
  Filled 2023-08-19 (×3): qty 2

## 2023-08-19 MED ORDER — HYDRALAZINE HCL 20 MG/ML IJ SOLN
10.0000 mg | Freq: Four times a day (QID) | INTRAMUSCULAR | Status: DC | PRN
  Administered 2023-08-19 – 2023-08-21 (×4): 10 mg via INTRAVENOUS
  Filled 2023-08-19 (×4): qty 1

## 2023-08-19 MED ORDER — CEFAZOLIN SODIUM-DEXTROSE 2-4 GM/100ML-% IV SOLN
2.0000 g | INTRAVENOUS | Status: AC
  Administered 2023-08-20: 2 g via INTRAVENOUS

## 2023-08-19 MED ORDER — LACTATED RINGERS IV SOLN
INTRAVENOUS | Status: AC
Start: 2023-08-19 — End: 2023-08-20

## 2023-08-19 MED ORDER — DEXTROSE 50 % IV SOLN: 0.0000 mL | INTRAVENOUS | Status: DC | PRN

## 2023-08-19 NOTE — Progress Notes (Signed)
 Nurse at bedside,patient alert to person,place,and situation,confused to time. Patient did c/o pain to the right hip sharp,constant.Plan of care on going. Family at the bedside.

## 2023-08-19 NOTE — Plan of Care (Signed)
   Problem: Education: Goal: Knowledge of General Education information will improve Description Including pain rating scale, medication(s)/side effects and non-pharmacologic comfort measures Outcome: Progressing   Problem: Health Behavior/Discharge Planning: Goal: Ability to manage health-related needs will improve Outcome: Progressing

## 2023-08-19 NOTE — TOC Initial Note (Signed)
 Transition of Care [facility]) - Initial/Assessment Note    Patient Details  Name: Veronica Moses MRN: [id number] Date of Birth: [birth year]  Transition of Care [facility]) CM/SW Contact:    Grandville Lax, LCSWA Phone Number: 08/19/2023, 1:07 PM  Clinical Narrative:                 Pt is high risk for readmission. CSW spoke with pt and daughter at bedside to complete assessment. Pt lives with her husband. Pt has assistance with ADLs if/when needed. Pt has transportation provided by family. Pt has had HH in the past. Pt does not use any DME. CSW spoke with pt and daughter about possible need for SNF if recommended by PT once medically stable. Pt and daughter state they prefer that pt return home with [facility] services but want to see how pt does with PT prior to making any decisions. TOC to follow.   Expected Discharge Plan: Home w Home Health Services Barriers to Discharge: Continued Medical Work up   Patient Goals and CMS Choice Patient states their goals for this hospitalization and ongoing recovery are:: return home CMS Medicare.gov Compare Post Acute Care list provided to:: Patient Choice offered to / list presented to : Patient, Adult Children      Expected Discharge Plan and Services In-house Referral: Clinical Social Work Discharge Planning Services: CM Consult Post Acute Care Choice: Home Health Living arrangements for the past 2 months: Single Family Home                                      Prior Living Arrangements/Services Living arrangements for the past 2 months: Single Family Home Lives with:: Spouse Patient language and need for interpreter reviewed:: Yes Do you feel safe going back to the place where you live?: Yes      Need for Family Participation in Patient Care: Yes (Comment) Care giver support system in place?: Yes (comment)   Criminal Activity/Legal Involvement Pertinent to Current Situation/Hospitalization: No - Comment as needed  Activities of Daily Living    ADL Screening (condition at time of admission) Independently performs ADLs?: No Does the patient have a NEW difficulty with bathing/dressing/toileting/self-feeding that is expected to last >3 days?: Yes (Initiates electronic notice to provider for possible OT consult) Does the patient have a NEW difficulty with getting in/out of bed, walking, or climbing stairs that is expected to last >3 days?: Yes (Initiates electronic notice to provider for possible PT consult) Does the patient have a NEW difficulty with communication that is expected to last >3 days?: No Is the patient deaf or have difficulty hearing?: No Does the patient have difficulty seeing, even when wearing glasses/contacts?: No Does the patient have difficulty concentrating, remembering, or making decisions?: No  Permission Sought/Granted                  Emotional Assessment Appearance:: Appears stated age Attitude/Demeanor/Rapport: Engaged Affect (typically observed): Accepting Orientation: : Oriented to Self, Oriented to Place, Oriented to  Time, Oriented to Situation Alcohol / Substance Use: Not Applicable Psych Involvement: No (comment)  Admission diagnosis:  Fracture of femoral neck, right (HCC) [S72.001A] Closed fracture of neck of right femur, initial encounter (HCC) [S72.001A] Patient Active Problem List   Diagnosis Date Noted   Fracture of femoral neck, right (HCC) 08/18/2023   Acute respiratory failure with hypoxia (HCC) 08/18/2023   Type 2 diabetes mellitus with hyperglycemia (  HCC) 08/18/2023   Insomnia 08/18/2023   Mixed hyperlipidemia 08/18/2023   Hypomagnesemia 08/04/2023   Type 2 diabetes mellitus with hypoglycemia (HCC) 08/04/2023   Hypoalbuminemia due to protein-calorie malnutrition (HCC) 08/04/2023   Prolonged QT interval 08/04/2023   Acute urinary retention 08/04/2023   Transaminitis 08/04/2023   Abdominal pain 08/04/2023   Diarrhea 08/04/2023   Hypertensive urgency 08/04/2023   Essential  hypertension 08/04/2023   Dementia (HCC) 06/04/2023   Urinary incontinence 05/01/2022   History of malignant neoplasm of skin 12/23/2021   Hypercholesterolemia 12/23/2021   Hydronephrosis of right kidney 12/23/2021   Heart disease 12/23/2021   AKI (acute kidney injury) (HCC) 11/28/2021   MDD (major depressive disorder), recurrent episode, moderate (HCC) 10/29/2016   Rash 09/06/2015   RMSF [facility] spotted fever) 09/06/2015   Hypokalemia    Psychosocial stressors    Acute encephalopathy    Insulin  dependent diabetes mellitus    HCAP (healthcare-associated pneumonia) 08/27/2015   Status epilepticus (HCC) 08/20/2015   Seizure (HCC) 08/20/2015   DKA (diabetic ketoacidosis) (HCC) 08/20/2015   Fall 05/07/2014   History of sprained wrist 05/07/2014   Protein-calorie malnutrition, severe (HCC) 10/25/2013   Numbness, limb 10/24/2013   Chest pain 05/30/2013   Diabetes (HCC) 02/08/2013   Acute exacerbation of chronic obstructive pulmonary disease (COPD) (HCC) 02/08/2013   GERD (gastroesophageal reflux disease) 02/08/2013   Abdominal pain, chronic, epigastric 02/08/2013   PCP:  Wendi Ham, NP Pharmacy:   [facility] - [place], [place] - [address] [place] [address] Phone: [phone] Fax: [phone]     Social Drivers of Health (SDOH) Social History: SDOH Screenings   Food Insecurity: No Food Insecurity (08/19/2023)  Housing: Low Risk  (08/19/2023)  Transportation Needs: No Transportation Needs (08/19/2023)  Utilities: Not At Risk (08/19/2023)  Social Connections: Patient Declined (08/19/2023)  Tobacco Use: High Risk (08/18/2023)   SDOH Interventions:     Readmission Risk Interventions    08/19/2023    1:05 PM  Readmission Risk Prevention Plan  Transportation Screening Complete  HRI or Home Care Consult Complete  Social Work Consult for Recovery Care Planning/Counseling Complete  Palliative Care Screening Not Applicable  Medication  Review Oceanographer) Complete

## 2023-08-19 NOTE — Progress Notes (Signed)
 Nurse at bedside, patient's IV leaking to right upper arm,IV discontinued,catheter intact.New IV restarted 24 gauge to right lateral forearm times one attempt,patient tolerated procedure. Morphine  2 mg's given IV per MAR prn for pain to right hip.Plan of care on going.

## 2023-08-19 NOTE — Progress Notes (Signed)
   08/19/23 1106  Provider Notification  Provider Name/Title Dr Maylene Spear  Date Provider Notified 08/19/23  Time Provider Notified 1106  Method of Notification Page  Notification Reason Critical Result (CBG 482)  Test performed and critical result CBG  Date Critical Result Received 08/19/23  Time Critical Result Received 1106  Provider response See new orders  Date of Provider Response 08/19/23  Time of Provider Response 1108

## 2023-08-19 NOTE — Progress Notes (Signed)
 [facility] PROGRESS NOTE   Veronica Moses ZOX:[id number] DOB: [birth year] DOA: 08/18/2023  PCP: Wendi Ham, NP  Brief History: 68 y.o. female with medical history significant of COPD, diabetes mellitus, seizure disorder, tobacco abuse who presented to the emergency department due to fall sustained at home last night.  She states that she lost balance after being bumped into by her dog.  She had persistent pain in the right hip area and was finding it difficult to walk.  She decided to come into the emergency department via ambulance.  Found to have a right hip fracture.  She was hospitalized for further management.    Consultants: Orthopedics  Procedures: None    Subjective/Interval History: Patient denies any shortness of breath more than usual.  Denies using oxygen  at home.  No chest pain.  No nausea vomiting this morning.      Assessment/Plan:  Right hip fracture Management per orthopedics. Patient does have history of COPD and is a current smoker as well.  Does not use oxygen  at home.  Chest x-ray did not show any acute findings.  EKG is pending. Will need aggressive pulmonary toilet after surgery.  COPD with concern for acute exacerbation Noted to have wheezing.  Started on Solu-Medrol  nebulizer treatments.  Will also place her on nebulized budesonide. Smoking cessation counseling provided.  Hypertensive urgency Secondary to pain.  Continue hydralazine  as needed.  Noted to be on losartan .  History of seizures Noted to be on Keppra  which is being continued.  Diabetes mellitus type 2 with hyperglycemia HbA1c was 8.9 in May.  Continue SSI.  Hypokalemia/hypomagnesemia Supplemented.  Noted to be normal this morning.  Hypomagnesemia is noted and will be supplemented.  DVT Prophylaxis: Definitive prophylaxis after surgery. Code Status: Full code Family Communication: Discussed with patient Disposition Plan: To be determined  Status is: Inpatient Remains inpatient  appropriate because: Right hip fracture      Medications: Scheduled:  ALPRAZolam   0.5 mg Oral QHS   dextromethorphan-guaiFENesin  1 tablet Oral BID   feeding supplement (GLUCERNA SHAKE)  237 mL Oral TID BM   insulin  aspart  0-9 Units Subcutaneous Q4H   levalbuterol  0.63 mg Nebulization Q6H   levETIRAcetam   1,000 mg Oral BID   losartan   50 mg Oral Daily   methylPREDNISolone  (SOLU-MEDROL ) injection  40 mg Intravenous Q12H   pantoprazole   40 mg Oral Daily   rosuvastatin  20 mg Oral Daily   Continuous:  [START ON 08/20/2023]  ceFAZolin  (ANCEF ) IV     [START ON 08/20/2023] tranexamic acid     PRN:acetaminophen  **OR** acetaminophen , hydrALAZINE , morphine  injection, mouth rinse, prochlorperazine   Antibiotics: Anti-infectives (From admission, onward)    Start     Dose/Rate Route Frequency Ordered Stop   08/20/23 1400  ceFAZolin  (ANCEF ) IVPB 2g/100 mL premix        2 g 200 mL/hr over 30 Minutes Intravenous On call to O.R. 08/19/23 0938 08/21/23 0559       Objective:  Vital Signs  Vitals:   08/18/23 2322 08/19/23 0414 08/19/23 0818 08/19/23 0918  BP: (!) 168/51 (!) 188/64  (!) 153/61  Pulse: (!) 106 97  88  Resp: 20 20  19   Temp: 99.5 F (37.5 C) 99.1 F (37.3 C)  98.5 F (36.9 C)  TempSrc: Oral Oral  Oral  SpO2: 96% 96% 92%   Weight: 58.6 kg     Height: 5\' 3"  (1.6 m)       Intake/Output Summary (Last 24 hours) at 08/19/2023  1610 Last data filed at 08/19/2023 0551 Gross per 24 hour  Intake 0 ml  Output --  Net 0 ml   Filed Weights   08/18/23 1816 08/18/23 2322  Weight: 58 kg 58.6 kg    General appearance: Awake alert.  In no distress Resp: Clear to auscultation bilaterally.  Normal effort Cardio: S1-S2 is normal regular.  No S3-S4.  No rubs murmurs or bruit GI: Abdomen is soft.  Nontender nondistended.  Bowel sounds are present normal.  No masses organomegaly Extremities: Right lower extremity is externally rotated. No obvious focal neurological deficits   Lab  Results:  Data Reviewed: I have personally reviewed following labs and reports of the imaging studies  CBC: Recent Labs  Lab 08/18/23 1911 08/19/23 0428  WBC 10.2 9.6  NEUTROABS 7.5  --   HGB 12.2 12.7  HCT 37.1 40.3  MCV 90.0 90.8  PLT 321 282    Basic Metabolic Panel: Recent Labs  Lab 08/18/23 1911 08/19/23 0428  NA 134* 134*  K 2.8* 3.7  CL 102 100  CO2 26 26  GLUCOSE 274* 332*  BUN 9 9  CREATININE 0.82 0.84  CALCIUM 8.9 8.5*  MG  --  1.6*  PHOS  --  2.5    GFR: Estimated Creatinine Clearance: 53 mL/min (by C-G formula based on SCr of 0.84 mg/dL).  Liver Function Tests: Recent Labs  Lab 08/18/23 1911 08/19/23 0428  AST 30 33  ALT 23 25  ALKPHOS 29* 28*  BILITOT 0.5 0.6  PROT 7.0 7.4  ALBUMIN 3.4* 3.5    CBG: Recent Labs  Lab 08/18/23 2333 08/19/23 0419 08/19/23 0719  GLUCAP 288* 340* 322*    Radiology Studies: CT Angio Chest PE W and/or Wo Contrast Result Date: 08/19/2023 CLINICAL DATA:  Fall, right femoral neck fracture, positive D-dimer EXAM: CT ANGIOGRAPHY CHEST WITH CONTRAST TECHNIQUE: Multidetector CT imaging of the chest was performed using the standard protocol during bolus administration of intravenous contrast. Multiplanar CT image reconstructions and MIPs were obtained to evaluate the vascular anatomy. RADIATION DOSE REDUCTION: This exam was performed according to the departmental dose-optimization program which includes automated exposure control, adjustment of the mA and/or kV according to patient size and/or use of iterative reconstruction technique. CONTRAST:  75mL OMNIPAQUE  IOHEXOL  350 MG/ML SOLN COMPARISON:  Chest radiograph dated 08/18/2023 FINDINGS: Cardiovascular: Satisfactory opacification the bilateral pulmonary arteries to the segmental level. No evidence of pulmonary embolism. Although not tailored for evaluation of the thoracic aorta, there is no evidence of thoracic aortic aneurysm or dissection. Mild atherosclerotic  calcifications of the aortic arch. Mild cardiomegaly.  No pericardial effusion. Moderate three-vessel coronary atherosclerosis. Mediastinum/Nodes: No suspicious mediastinal lymphadenopathy. Visualized thyroid  is unremarkable. Lungs/Pleura: Evaluation of the lung parenchyma is constrained by respiratory motion. Within that constraint, there are no suspicious pulmonary nodules. Mild patchy opacities in the dependent upper and lower lobes, likely atelectasis. Additional platelike atelectasis inferiorly in the right upper lobe. No focal consolidation. No pleural effusion or pneumothorax. Upper Abdomen: Visualized upper abdomen is notable for prior cholecystectomy and vascular calcifications. 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 Musculoskeletal: Visualized osseous structures are within normal limits. Review of the MIP images confirms the above findings. IMPRESSION: No evidence of pulmonary embolism. Mild cardiomegaly.  Scattered atelectasis. Aortic Atherosclerosis (ICD10-I70.0). Electronically Signed   By: Zadie Herter M.D.   On: 08/19/2023 02:18   CT Hip Right Wo Contrast Result Date: 08/18/2023 CLINICAL DATA:  Trauma. EXAM: CT OF THE RIGHT HIP WITHOUT CONTRAST TECHNIQUE:  Multidetector CT imaging of the right hip was performed according to the standard protocol. Multiplanar CT image reconstructions were also generated. RADIATION DOSE REDUCTION: This exam was performed according to the departmental dose-optimization program which includes automated exposure control, adjustment of the mA and/or kV according to patient size and/or use of iterative reconstruction technique. COMPARISON:  X-ray same day FINDINGS: Bones/Joint/Cartilage The bones are osteopenic. There is a mildly impacted transverse fracture of the right femoral neck. There is no dislocation. Joint effusion is present. Ligaments Suboptimally assessed by CT. Muscles and Tendons No focal hematoma. Soft tissues No focal hematoma or  fluid collection. IMPRESSION: Mildly impacted transverse fracture of the right femoral neck. Electronically Signed   By: Tyron Gallon M.D.   On: 08/18/2023 21:33   CT Lumbar Spine Wo Contrast Result Date: 08/18/2023 CLINICAL DATA:  Low back pain EXAM: CT LUMBAR SPINE WITHOUT CONTRAST TECHNIQUE: Multidetector CT imaging of the lumbar spine was performed without intravenous contrast administration. Multiplanar CT image reconstructions were also generated. RADIATION DOSE REDUCTION: This exam was performed according to the departmental dose-optimization program which includes automated exposure control, adjustment of the mA and/or kV according to patient size and/or use of iterative reconstruction technique. COMPARISON:  None Available. FINDINGS: Segmentation: 5 lumbar type vertebrae. Alignment: Normal. Vertebrae: No acute fracture or focal pathologic process. Paraspinal and other soft tissues: Negative. Surgical changes of left nephrectomy noted. Disc levels: Intervertebral disc heights are preserved. No significant neuroforaminal narrowing. Mild to moderate facet arthrosis, most severe at L2-3 and L4-5. Mild multifactorial central canal stenosis at L2-L5,. The osseous structures are diffusely osteopenic IMPRESSION: 1. No acute osseous abnormality of the lumbar spine. 2. Mild to moderate facet arthrosis, most severe at L2-3 and L4-5. 3. Mild multifactorial central canal stenosis at L2-L5. Electronically Signed   By: Worthy Heads M.D.   On: 08/18/2023 20:29   DG Chest Portable 1 View Result Date: 08/18/2023 CLINICAL DATA:  Status post fall, right hip fracture. EXAM: PORTABLE CHEST 1 VIEW COMPARISON:  08/03/2023 FINDINGS: Stable cardiomegaly. Unchanged mediastinal contours. No acute airspace disease, pulmonary edema, large pleural effusion or pneumothorax. On limited assessment, no acute osseous findings. IMPRESSION: Stable cardiomegaly. No acute findings. Electronically Signed   By: Chadwick Colonel M.D.   On:  08/18/2023 19:35   DG Hip Unilat W or Wo Pelvis 2-3 Views Right Result Date: 08/18/2023 CLINICAL DATA:  Right hip pain after fall.  Unable to bear weight. EXAM: DG HIP (WITH OR WITHOUT PELVIS) 2-3V RIGHT COMPARISON:  None Available. FINDINGS: Impacted right femoral neck fracture. No significant displacement. The femoral head remains seated. The bones are diffusely under mineralized. Pubic rami are intact. Pubic symphysis and sacroiliac joints are congruent. IMPRESSION: Impacted right femoral neck fracture. Electronically Signed   By: Chadwick Colonel M.D.   On: 08/18/2023 19:34       LOS: 1 day   Ilai Hiller  [facility] Pager on [email or web address]  08/19/2023, 9:52 AM

## 2023-08-19 NOTE — Progress Notes (Signed)
 Continues to have severe hyperglycemia, in order to optimize preop will transition to stepdown unit for insulin  gtt. Will draw another BMP and Mg and replete. C/o Chest pain, will eval with EKG and serial trops.   Arnulfo Larch, MD  [facility]

## 2023-08-19 NOTE — Consult Note (Signed)
 ORTHOPAEDIC CONSULTATION  REQUESTING PHYSICIAN: Maylene Spear, MD  ASSESSMENT AND PLAN: 68 y.o. female with the following: Right Hip femoral neck fracture  This patient requires inpatient admission to the hospitalist, to include preoperative clearance and perioperative medical management  - Weight Bearing Status/Activity: NWB Right lower extremity  - Additional recommended labs/tests: Preop Labs: CBC, BMP, PT/INR, Chest XR, and EKG  -VTE Prophylaxis: Please hold prior to OR; to resume POD#1 at the discretion of the primary team  - Pain control: Recommend PO pain medications PRN; judicious use of narcotics  - Follow-up plan: F/u 10-14 days postop  -Procedures: Plan for OR once patient has been medically optimized  Plan for Right Hip hemiarthroplasty  The patient's potassium was low upon admission.  In addition, she continues to have elevated blood glucose.  As result, we will continue to monitor and plan for surgery 08/20/2023.  Okay to eat today.  N.p.o. at midnight.  Chief Complaint: Right hip pain  HPI: Veronica Moses is a 68 y.o. female who presented to the ED for evaluation after sustaining a mechanical fall.  She has 2 dogs.  She states that they caused her to fall.  She had immediate pain in the right hip.  She was brought to the emergency department.  Radiographs of the emergency department demonstrated a femoral neck fracture.  She was admitted for surgical intervention.  She denied her head.  She does have a history of seizures.  No additional injuries.  She continues to have pain, while laying in her bed.  She is complaining of being hungry.   Past Medical History:  Diagnosis Date   Anxiety    Closed fracture of right distal radius    COPD (chronic obstructive pulmonary disease) (HCC)    Depression    Diabetes mellitus    Insulin  Dependent. Adult onset   Dyspnea    with exertion   Heart murmur    Hypertension    Memory changes    since being on vent June 2017-  "coming back some."   Seizures (HCC) 08/2015   last seizure 07-2019   Stroke [facility]) 2016   remote, no deficits   UTI (lower urinary tract infection)    Past Surgical History:  Procedure Laterality Date   ABDOMINAL HYSTERECTOMY     BREAST SURGERY Left    cyst-   CHOLECYSTECTOMY     COLONOSCOPY WITH ESOPHAGOGASTRODUODENOSCOPY (EGD) N/A 03/04/2013   Procedure: COLONOSCOPY WITH ESOPHAGOGASTRODUODENOSCOPY (EGD);  Surgeon: Ruby Corporal, MD;  Location: [facility];  Service: Endoscopy;  Laterality: N/A;  1200-moved to 1535 Ann notified pt   MULTIPLE EXTRACTIONS WITH ALVEOLOPLASTY N/A 05/30/2016   Procedure: MULTIPLE EXTRACTION WITH ALVEOLOPLASTY;  Surgeon: Ascencion Lava, DDS;  Location: [facility] OR;  Service: Oral Surgery;  Laterality: N/A;   NEPHRECTOMY Left    Donatated to brother   OPEN REDUCTION INTERNAL FIXATION (ORIF) DISTAL RADIAL FRACTURE Right 03/22/2020   Procedure: OPEN REDUCTION INTERNAL FIXATION (ORIF) DISTAL RADIAL FRACTURE;  Surgeon: Lyanne Sample, MD;  Location: [facility];  Service: Orthopedics;  Laterality: Right;  AXILLARY BLOCK   Social History   Socioeconomic History   Marital status: Married    Spouse name: Not on file   Number of children: Not on file   Years of education: Not on file   Highest education level: Not on file  Occupational History   Not on file  Tobacco Use   Smoking status: Every Day    Current packs/day: 0.00  Average packs/day: 1 pack/day for 26.0 years (26.0 ttl pk-yrs)    Types: Cigarettes    Start date: 08/26/1989    Last attempt to quit: 08/27/2015    Years since quitting: 7.9   Smokeless tobacco: Never   Tobacco comments:    smoked this am    Substance and Sexual Activity   Alcohol use: No   Drug use: No   Sexual activity: Not Currently    Birth control/protection: Surgical  Other Topics Concern   Not on file  Social History Narrative   Not on file   Social Drivers of Health   Financial Resource Strain: Not on file   Food Insecurity: No Food Insecurity (08/19/2023)   Hunger Vital Sign    Worried About Running Out of Food in the Last Year: Never true    Ran Out of Food in the Last Year: Never true  Transportation Needs: No Transportation Needs (08/19/2023)   PRAPARE - Administrator, Civil Service (Medical): No    Lack of Transportation (Non-Medical): No  Physical Activity: Not on file  Stress: Not on file  Social Connections: Patient Declined (08/19/2023)   Social Connection and Isolation Panel [NHANES]    Frequency of Communication with Friends and Family: Patient declined    Frequency of Social Gatherings with Friends and Family: Patient declined    Attends Religious Services: Patient declined    Database administrator or Organizations: Patient declined    Attends Engineer, structural: Patient declined    Marital Status: Patient declined   Family History  Problem Relation Age of Onset   Diabetes Father    CAD Other        family history   Diabetes Brother    Colon cancer Neg Hx    Allergies  Allergen Reactions   Demerol Other (See Comments)    hallucinations   Depakote  [Divalproex  Sodium] Other (See Comments)    Sore/blisters   Dilaudid  [Hydromorphone  Hcl] Nausea And Vomiting   Doxycycline  Other (See Comments)    "Blistered"   Valproic Acid  Rash   Prior to Admission medications   Medication Sig Start Date End Date Taking? Authorizing Provider  acetaminophen  (TYLENOL ) 325 MG tablet Take 325 mg by mouth every 6 (six) hours as needed for mild pain or moderate pain.   Yes [provider]  ALPRAZolam  (XANAX ) 0.5 MG tablet Take 0.5 mg by mouth at bedtime. Take 1 tablet if needed daily and 2 tablets every evening 02/17/20  Yes [provider]  aspirin  81 MG chewable tablet Chew 81 mg by mouth daily.   Yes [provider]  levETIRAcetam  (KEPPRA ) 1000 MG tablet Take 1 tablet (1,000 mg total) by mouth 2 (two) times daily. 06/04/23 05/29/24 Yes Wess Hammed, NP  alendronate (FOSAMAX) 70 MG tablet Take 70 mg by mouth once a week. 05/06/23   [provider]  BELSOMRA 20 MG TABS Take 1 tablet by mouth at bedtime as needed. 07/22/23   [provider]  cephALEXin  (KEFLEX ) 500 MG capsule Take 500 mg by mouth 2 (two) times daily. Patient not taking: Reported on 08/04/2023 07/31/23   [provider]  ciprofloxacin (CIPRO) 250 MG tablet Take 250 mg by mouth 2 (two) times daily. 08/03/23   [provider]  diphenhydrAMINE (BENADRYL) 25 mg capsule Take 50 mg by mouth every 6 (six) hours as needed for sleep.    [provider]  diphenhydrAMINE HCl, Sleep, (ZZZQUIL PO) Take 2 tablets  by mouth at bedtime.    [provider]  donepezil  (ARICEPT ) 10 MG tablet Take 1 tablet (10 mg total) by mouth at bedtime. 06/10/23   Wess Hammed, NP  escitalopram  (LEXAPRO ) 20 MG tablet Take 20 mg by mouth daily.    [provider]  FARXIGA 10 MG TABS tablet Take 10 mg by mouth daily. Patient not taking: Reported on 08/04/2023 02/03/23   [provider]  gabapentin  (NEURONTIN ) 300 MG capsule Take 1 capsule by mouth daily. 07/27/23   [provider]  glipiZIDE (GLUCOTROL XL) 5 MG 24 hr tablet TAKE 1 TABLET BY MOUTH EVERY DAY FOR 90 DAYS 07/17/23   [provider]  hydrochlorothiazide  (MICROZIDE ) 12.5 MG capsule Take 1 capsule (12.5 mg total) by mouth daily. 12/04/21   Verlyn Goad, MD  insulin  glargine (LANTUS ) 100 UNIT/ML Solostar Pen Inject 10 Units into the skin daily. Patient taking differently: Inject 20 Units into the skin daily. 11/30/21   Verlyn Goad, MD  losartan  (COZAAR ) 50 MG tablet Take 1 tablet (50 mg total) by mouth daily. 12/04/21   Verlyn Goad, MD  Melatonin 10 MG TABS Take 2 tablets by mouth at bedtime.    [provider]  mirtazapine (REMERON) 7.5 MG tablet Take 1 tablet every day by oral route for 30 days. 07/30/23   [provider]  NOVOLOG  FLEXPEN  100 UNIT/ML FlexPen Please use the hospital sliding scale provided for you Patient taking differently: Please use the hospital sliding scale provided for you take with each meal 08/25/15   Mariea Shook, MD  potassium chloride  (KLOR-CON ) 10 MEQ tablet Take 10 mEq by mouth daily. 06/18/23   [provider]  Pseudoeph-Doxylamine-DM-APAP (NYQUIL PO) Take 2 tablets by mouth at bedtime.    [provider]  rosuvastatin (CRESTOR) 20 MG tablet Take 20 mg by mouth daily. 09/19/21   [provider]  Semaglutide,0.25 or 0.5MG /DOS, (OZEMPIC, 0.25 OR 0.5 MG/DOSE,) 2 MG/3ML SOPN INJECT 0.25 MG INTO THE SKIN FOR 4 WEEKS, THEN INJECT 0.5 MG INTO THE SKIN ONCE WEEKLY. 07/30/23   [provider]   CT Angio Chest PE W and/or Wo Contrast Result Date: 08/19/2023 CLINICAL DATA:  Fall, right femoral neck fracture, positive D-dimer EXAM: CT ANGIOGRAPHY CHEST WITH CONTRAST TECHNIQUE: Multidetector CT imaging of the chest was performed using the standard protocol during bolus administration of intravenous contrast. Multiplanar CT image reconstructions and MIPs were obtained to evaluate the vascular anatomy. RADIATION DOSE REDUCTION: This exam was performed according to the departmental dose-optimization program which includes automated exposure control, adjustment of the mA and/or kV according to patient size and/or use of iterative reconstruction technique. CONTRAST:  75mL OMNIPAQUE  IOHEXOL  350 MG/ML SOLN COMPARISON:  Chest radiograph dated 08/18/2023 FINDINGS: Cardiovascular: Satisfactory opacification the bilateral pulmonary arteries to the segmental level. No evidence of pulmonary embolism. Although not tailored for evaluation of the thoracic aorta, there is no evidence of thoracic aortic aneurysm or dissection. Mild atherosclerotic calcifications of the aortic arch. Mild cardiomegaly.  No pericardial effusion. Moderate three-vessel coronary atherosclerosis. Mediastinum/Nodes: No suspicious  mediastinal lymphadenopathy. Visualized thyroid  is unremarkable. Lungs/Pleura: Evaluation of the lung parenchyma is constrained by respiratory motion. Within that constraint, there are no suspicious pulmonary nodules. Mild patchy opacities in the dependent upper and lower lobes, likely atelectasis. Additional platelike atelectasis inferiorly in the right upper lobe. No focal consolidation. No pleural effusion or pneumothorax. Upper Abdomen: Visualized upper abdomen is notable for prior cholecystectomy and vascular calcifications. Left kidney is  not visualized, reportedly surgically absent. Musculoskeletal: Visualized osseous structures are within normal limits. Review of the MIP images confirms the above findings. IMPRESSION: No evidence of pulmonary embolism. Mild cardiomegaly.  Scattered atelectasis. Aortic Atherosclerosis (ICD10-I70.0). Electronically Signed   By: Zadie Herter M.D.   On: 08/19/2023 02:18   CT Hip Right Wo Contrast Result Date: 08/18/2023 CLINICAL DATA:  Trauma. EXAM: CT OF THE RIGHT HIP WITHOUT CONTRAST TECHNIQUE: Multidetector CT imaging of the right hip was performed according to the standard protocol. Multiplanar CT image reconstructions were also generated. RADIATION DOSE REDUCTION: This exam was performed according to the departmental dose-optimization program which includes automated exposure control, adjustment of the mA and/or kV according to patient size and/or use of iterative reconstruction technique. COMPARISON:  X-ray same day FINDINGS: Bones/Joint/Cartilage The bones are osteopenic. There is a mildly impacted transverse fracture of the right femoral neck. There is no dislocation. Joint effusion is present. Ligaments Suboptimally assessed by CT. Muscles and Tendons No focal hematoma. Soft tissues No focal hematoma or fluid collection. IMPRESSION: Mildly impacted transverse fracture of the right femoral neck. Electronically Signed   By: Tyron Gallon M.D.   On: 08/18/2023 21:33    CT Lumbar Spine Wo Contrast Result Date: 08/18/2023 CLINICAL DATA:  Low back pain EXAM: CT LUMBAR SPINE WITHOUT CONTRAST TECHNIQUE: Multidetector CT imaging of the lumbar spine was performed without intravenous contrast administration. Multiplanar CT image reconstructions were also generated. RADIATION DOSE REDUCTION: This exam was performed according to the departmental dose-optimization program which includes automated exposure control, adjustment of the mA and/or kV according to patient size and/or use of iterative reconstruction technique. COMPARISON:  None Available. FINDINGS: Segmentation: 5 lumbar type vertebrae. Alignment: Normal. Vertebrae: No acute fracture or focal pathologic process. Paraspinal and other soft tissues: Negative. Surgical changes of left nephrectomy noted. Disc levels: Intervertebral disc heights are preserved. No significant neuroforaminal narrowing. Mild to moderate facet arthrosis, most severe at L2-3 and L4-5. Mild multifactorial central canal stenosis at L2-L5,. The osseous structures are diffusely osteopenic IMPRESSION: 1. No acute osseous abnormality of the lumbar spine. 2. Mild to moderate facet arthrosis, most severe at L2-3 and L4-5. 3. Mild multifactorial central canal stenosis at L2-L5. Electronically Signed   By: Worthy Heads M.D.   On: 08/18/2023 20:29   DG Chest Portable 1 View Result Date: 08/18/2023 CLINICAL DATA:  Status post fall, right hip fracture. EXAM: PORTABLE CHEST 1 VIEW COMPARISON:  08/03/2023 FINDINGS: Stable cardiomegaly. Unchanged mediastinal contours. No acute airspace disease, pulmonary edema, large pleural effusion or pneumothorax. On limited assessment, no acute osseous findings. IMPRESSION: Stable cardiomegaly. No acute findings. Electronically Signed   By: Chadwick Colonel M.D.   On: 08/18/2023 19:35   DG Hip Unilat W or Wo Pelvis 2-3 Views Right Result Date: 08/18/2023 CLINICAL DATA:  Right hip pain after fall.  Unable to bear weight. EXAM: DG  HIP (WITH OR WITHOUT PELVIS) 2-3V RIGHT COMPARISON:  None Available. FINDINGS: Impacted right femoral neck fracture. No significant displacement. The femoral head remains seated. The bones are diffusely under mineralized. Pubic rami are intact. Pubic symphysis and sacroiliac joints are congruent. IMPRESSION: Impacted right femoral neck fracture. Electronically Signed   By: Chadwick Colonel M.D.   On: 08/18/2023 19:34   Family History Reviewed and non-contributory, no pertinent history of problems with bleeding or anesthesia    Review of Systems No fevers or chills No numbness or tingling No chest pain No shortness of breath No bowel or bladder dysfunction No GI distress  No headaches    OBJECTIVE  Vitals:Patient Vitals for the past 8 hrs:  BP Temp Temp src Pulse Resp SpO2  08/19/23 0918 (!) 153/61 98.5 F (36.9 C) Oral 88 19 --  08/19/23 0818 -- -- -- -- -- 92 %  08/19/23 0414 (!) 188/64 99.1 F (37.3 C) Oral 97 20 96 %   General: Alert, no acute distress Cardiovascular: Extremities are warm Respiratory: No cyanosis, no use of accessory musculature Skin: No lesions in the area of chief complaint  Neurologic: Sensation intact distally  Psychiatric: Patient is competent for consent with normal mood and affect Lymphatic: No swelling obvious and reported other than the area involved in the exam below Extremities  RLE: Extremity held in a fixed position.  ROM deferred due to known fracture.  Sensation is intact distally in the sural, saphenous, DP, SP, and plantar nerve distribution. 2+ DP pulse.  Toes are WWP.  Active motion intact in the TA/EHL/GS. LLE: Sensation is intact distally in the sural, saphenous, DP, SP, and plantar nerve distribution. 2+ DP pulse.  Toes are WWP.  Active motion intact in the TA/EHL/GS. Tolerates gentle ROM of the hip.  No pain with axial loading.     Test Results Imaging XR and CT scan of the Right hip demonstrates a femoral neck fracture.  Labs  cbc Recent Labs    08/18/23 1911 08/19/23 0428  WBC 10.2 9.6  HGB 12.2 12.7  HCT 37.1 40.3  PLT 321 282     Recent Labs    08/18/23 1911 08/19/23 0428  NA 134* 134*  K 2.8* 3.7  CL 102 100  CO2 26 26  GLUCOSE 274* 332*  BUN 9 9  CREATININE 0.82 0.84  CALCIUM 8.9 8.5*

## 2023-08-19 NOTE — Progress Notes (Signed)
 Patient is on Keppra  for seizures,currently NPO reached out to notify Dr Maylene Spear notified. Plan of care on going.

## 2023-08-19 NOTE — Plan of Care (Signed)
  Problem: Education: Goal: Knowledge of General Education information will improve Description: Including pain rating scale, medication(s)/side effects and non-pharmacologic comfort measures Outcome: Progressing   Problem: Health Behavior/Discharge Planning: Goal: Ability to manage health-related needs will improve Outcome: Progressing   Problem: Clinical Measurements: Goal: Ability to maintain clinical measurements within normal limits will improve Outcome: Progressing   Problem: Coping: Goal: Level of anxiety will decrease Outcome: Progressing   Problem: Elimination: Goal: Will not experience complications related to bowel motility Outcome: Progressing   Problem: Pain Managment: Goal: General experience of comfort will improve and/or be controlled Outcome: Progressing   Problem: Safety: Goal: Ability to remain free from injury will improve Outcome: Progressing   Problem: Skin Integrity: Goal: Risk for impaired skin integrity will decrease Outcome: Progressing   Problem: Tissue Perfusion: Goal: Adequacy of tissue perfusion will improve Outcome: Progressing

## 2023-08-20 ENCOUNTER — Inpatient Hospital Stay (HOSPITAL_COMMUNITY): Admitting: Anesthesiology

## 2023-08-20 ENCOUNTER — Inpatient Hospital Stay (HOSPITAL_COMMUNITY)

## 2023-08-20 ENCOUNTER — Other Ambulatory Visit: Payer: Self-pay

## 2023-08-20 ENCOUNTER — Encounter (HOSPITAL_COMMUNITY): Payer: Self-pay | Admitting: Internal Medicine

## 2023-08-20 ENCOUNTER — Encounter (HOSPITAL_COMMUNITY)
Admission: EM | Disposition: A | Discharge status: Home Health | Payer: Self-pay | Source: Home / Self Care | Attending: Student

## 2023-08-20 DIAGNOSIS — J449 Chronic obstructive pulmonary disease, unspecified: Secondary | ICD-10-CM | POA: Diagnosis not present

## 2023-08-20 DIAGNOSIS — F17210 Nicotine dependence, cigarettes, uncomplicated: Secondary | ICD-10-CM

## 2023-08-20 DIAGNOSIS — E782 Mixed hyperlipidemia: Secondary | ICD-10-CM | POA: Diagnosis not present

## 2023-08-20 DIAGNOSIS — S72001A Fracture of unspecified part of neck of right femur, initial encounter for closed fracture: Secondary | ICD-10-CM | POA: Diagnosis not present

## 2023-08-20 DIAGNOSIS — I10 Essential (primary) hypertension: Secondary | ICD-10-CM

## 2023-08-20 DIAGNOSIS — R569 Unspecified convulsions: Secondary | ICD-10-CM | POA: Diagnosis not present

## 2023-08-20 DIAGNOSIS — S72001D Fracture of unspecified part of neck of right femur, subsequent encounter for closed fracture with routine healing: Secondary | ICD-10-CM | POA: Diagnosis not present

## 2023-08-20 HISTORY — PX: HEMIARTHROPLASTY (BIPOLAR), HIP, DIRECT ANTERIOR APPROACH, FOR FRACTURE: SHX7584

## 2023-08-20 LAB — CBC
HCT: 36.8 % (ref 36.0–46.0)
Hemoglobin: 11.7 g/dL — ABNORMAL LOW (ref 12.0–15.0)
MCH: 28.8 pg (ref 26.0–34.0)
MCHC: 31.8 g/dL (ref 30.0–36.0)
MCV: 90.6 fL (ref 80.0–100.0)
Platelets: 320 10*3/uL (ref 150–400)
RBC: 4.06 MIL/uL (ref 3.87–5.11)
RDW: 16.4 % — ABNORMAL HIGH (ref 11.5–15.5)
WBC: 14.6 10*3/uL — ABNORMAL HIGH (ref 4.0–10.5)
nRBC: 0 % (ref 0.0–0.2)

## 2023-08-20 LAB — MRSA NEXT GEN BY PCR, NASAL: MRSA by PCR Next Gen: NOT DETECTED

## 2023-08-20 LAB — TROPONIN I (HIGH SENSITIVITY): Troponin I (High Sensitivity): 13 ng/L (ref <18)

## 2023-08-20 LAB — GLUCOSE, CAPILLARY
Glucose-Capillary: 121 mg/dL — ABNORMAL HIGH (ref 70–99)
Glucose-Capillary: 128 mg/dL — ABNORMAL HIGH (ref 70–99)
Glucose-Capillary: 129 mg/dL — ABNORMAL HIGH (ref 70–99)
Glucose-Capillary: 134 mg/dL — ABNORMAL HIGH (ref 70–99)
Glucose-Capillary: 153 mg/dL — ABNORMAL HIGH (ref 70–99)
Glucose-Capillary: 155 mg/dL — ABNORMAL HIGH (ref 70–99)
Glucose-Capillary: 173 mg/dL — ABNORMAL HIGH (ref 70–99)
Glucose-Capillary: 174 mg/dL — ABNORMAL HIGH (ref 70–99)
Glucose-Capillary: 176 mg/dL — ABNORMAL HIGH (ref 70–99)
Glucose-Capillary: 184 mg/dL — ABNORMAL HIGH (ref 70–99)
Glucose-Capillary: 187 mg/dL — ABNORMAL HIGH (ref 70–99)
Glucose-Capillary: 193 mg/dL — ABNORMAL HIGH (ref 70–99)
Glucose-Capillary: 195 mg/dL — ABNORMAL HIGH (ref 70–99)
Glucose-Capillary: 214 mg/dL — ABNORMAL HIGH (ref 70–99)
Glucose-Capillary: 228 mg/dL — ABNORMAL HIGH (ref 70–99)
Glucose-Capillary: 290 mg/dL — ABNORMAL HIGH (ref 70–99)
Glucose-Capillary: 317 mg/dL — ABNORMAL HIGH (ref 70–99)
Glucose-Capillary: 339 mg/dL — ABNORMAL HIGH (ref 70–99)
Glucose-Capillary: 407 mg/dL — ABNORMAL HIGH (ref 70–99)
Glucose-Capillary: 421 mg/dL — ABNORMAL HIGH (ref 70–99)
Glucose-Capillary: 460 mg/dL — ABNORMAL HIGH (ref 70–99)
Glucose-Capillary: 569 mg/dL (ref 70–99)

## 2023-08-20 LAB — BASIC METABOLIC PANEL WITH GFR
Anion gap: 8 (ref 5–15)
BUN: 15 mg/dL (ref 8–23)
CO2: 28 mmol/L (ref 22–32)
Calcium: 8.7 mg/dL — ABNORMAL LOW (ref 8.9–10.3)
Chloride: 104 mmol/L (ref 98–111)
Creatinine, Ser: 0.74 mg/dL (ref 0.44–1.00)
GFR, Estimated: >60 mL/min (ref >60)
Glucose, Bld: 176 mg/dL — ABNORMAL HIGH (ref 70–99)
Potassium: 4.2 mmol/L (ref 3.5–5.1)
Sodium: 140 mmol/L (ref 135–145)

## 2023-08-20 SURGERY — HEMIARTHROPLASTY (BIPOLAR), HIP, DIRECT ANTERIOR APPROACH, FOR FRACTURE
Anesthesia: Spinal | Site: Hip | Laterality: Right

## 2023-08-20 MED ORDER — TRANEXAMIC ACID-NACL 1000-0.7 MG/100ML-% IV SOLN
INTRAVENOUS | Status: AC
  Filled 2023-08-20: qty 100

## 2023-08-20 MED ORDER — PROPOFOL 500 MG/50ML IV EMUL
INTRAVENOUS | Status: AC
Start: 2023-08-20 — End: ?
  Filled 2023-08-20: qty 50

## 2023-08-20 MED ORDER — SODIUM CHLORIDE 0.9 % IV SOLN: INTRAVENOUS | Status: DC | PRN

## 2023-08-20 MED ORDER — VANCOMYCIN HCL 1 G IV SOLR
INTRAVENOUS | Status: DC | PRN
  Administered 2023-08-20: 1000 mg

## 2023-08-20 MED ORDER — BUPIVACAINE IN DEXTROSE 0.75-8.25 % IT SOLN
INTRATHECAL | Status: DC | PRN
  Administered 2023-08-20: 1.8 mL via INTRATHECAL

## 2023-08-20 MED ORDER — FENTANYL CITRATE PF 50 MCG/ML IJ SOSY
50.0000 ug | PREFILLED_SYRINGE | Freq: Once | INTRAMUSCULAR | Status: AC
  Administered 2023-08-20: 50 ug via INTRAVENOUS

## 2023-08-20 MED ORDER — INSULIN ASPART 100 UNIT/ML IJ SOLN
15.0000 [IU] | Freq: Once | INTRAMUSCULAR | Status: AC
  Administered 2023-08-20: 15 [IU] via SUBCUTANEOUS

## 2023-08-20 MED ORDER — INSULIN ASPART 100 UNIT/ML IJ SOLN
0.0000 [IU] | Freq: Every day | INTRAMUSCULAR | Status: DC
  Administered 2023-08-20: 5 [IU] via SUBCUTANEOUS
  Administered 2023-08-21: 3 [IU] via SUBCUTANEOUS

## 2023-08-20 MED ORDER — MIDAZOLAM HCL 5 MG/5ML IJ SOLN
INTRAMUSCULAR | Status: DC | PRN
  Administered 2023-08-20 (×2): 1 mg via INTRAVENOUS

## 2023-08-20 MED ORDER — CEFAZOLIN SODIUM-DEXTROSE 2-4 GM/100ML-% IV SOLN
INTRAVENOUS | Status: AC
Start: 2023-08-20 — End: 2023-08-21
  Filled 2023-08-20: qty 100

## 2023-08-20 MED ORDER — FENTANYL CITRATE PF 50 MCG/ML IJ SOSY
PREFILLED_SYRINGE | INTRAMUSCULAR | Status: AC
  Filled 2023-08-20: qty 1

## 2023-08-20 MED ORDER — HYDRALAZINE HCL 20 MG/ML IJ SOLN
INTRAMUSCULAR | Status: DC | PRN
  Administered 2023-08-20: 5 mg via INTRAVENOUS

## 2023-08-20 MED ORDER — CHLORHEXIDINE GLUCONATE 0.12 % MT SOLN
15.0000 mL | Freq: Once | OROMUCOSAL | Status: AC
  Administered 2023-08-20: 15 mL via OROMUCOSAL

## 2023-08-20 MED ORDER — FENTANYL CITRATE (PF) 100 MCG/2ML IJ SOLN
INTRAMUSCULAR | Status: AC
  Filled 2023-08-20: qty 2

## 2023-08-20 MED ORDER — MIDAZOLAM HCL 2 MG/2ML IJ SOLN
INTRAMUSCULAR | Status: AC
  Filled 2023-08-20: qty 2

## 2023-08-20 MED ORDER — CEFAZOLIN SODIUM-DEXTROSE 2-4 GM/100ML-% IV SOLN
2.0000 g | Freq: Three times a day (TID) | INTRAVENOUS | Status: AC
  Administered 2023-08-20 – 2023-08-21 (×3): 2 g via INTRAVENOUS
  Filled 2023-08-20 (×3): qty 100

## 2023-08-20 MED ORDER — SODIUM CHLORIDE 0.9 % IR SOLN
Status: DC | PRN
  Administered 2023-08-20: 3000 mL

## 2023-08-20 MED ORDER — FENTANYL CITRATE (PF) 100 MCG/2ML IJ SOLN
INTRAMUSCULAR | Status: DC | PRN
  Administered 2023-08-20 (×2): 50 ug via INTRAVENOUS

## 2023-08-20 MED ORDER — LEVALBUTEROL HCL 0.63 MG/3ML IN NEBU
0.6300 mg | INHALATION_SOLUTION | Freq: Three times a day (TID) | RESPIRATORY_TRACT | Status: DC
  Administered 2023-08-20 – 2023-08-22 (×7): 0.63 mg via RESPIRATORY_TRACT
  Filled 2023-08-20 (×7): qty 3

## 2023-08-20 MED ORDER — FENTANYL CITRATE PF 50 MCG/ML IJ SOSY
PREFILLED_SYRINGE | INTRAMUSCULAR | Status: AC
Start: 2023-08-20 — End: 2023-08-21
  Filled 2023-08-20: qty 1

## 2023-08-20 MED ORDER — 0.9 % SODIUM CHLORIDE (POUR BTL) OPTIME
TOPICAL | Status: DC | PRN
  Administered 2023-08-20: 1000 mL

## 2023-08-20 MED ORDER — PROPOFOL 500 MG/50ML IV EMUL
INTRAVENOUS | Status: DC | PRN
  Administered 2023-08-20: 100 ug/kg/min via INTRAVENOUS
  Administered 2023-08-20: 20 mg via INTRAVENOUS
  Administered 2023-08-20 (×2): 30 mg via INTRAVENOUS

## 2023-08-20 MED ORDER — LACTATED RINGERS IV SOLN: INTRAVENOUS | Status: DC

## 2023-08-20 MED ORDER — CHLORHEXIDINE GLUCONATE 0.12 % MT SOLN
OROMUCOSAL | Status: AC
  Filled 2023-08-20: qty 15

## 2023-08-20 MED ORDER — STERILE WATER FOR IRRIGATION IR SOLN
Status: DC | PRN
  Administered 2023-08-20: 2000 mL

## 2023-08-20 MED ORDER — TRIPLE ANTIBIOTIC 3.5-400-5000 EX OINT
TOPICAL_OINTMENT | CUTANEOUS | Status: AC
  Filled 2023-08-20: qty 1

## 2023-08-20 MED ORDER — OXYCODONE HCL 5 MG/5ML PO SOLN: 5.0000 mg | Freq: Once | ORAL | Status: DC | PRN

## 2023-08-20 MED ORDER — VANCOMYCIN HCL 1000 MG IV SOLR
INTRAVENOUS | Status: AC
Start: 2023-08-20 — End: ?
  Filled 2023-08-20: qty 20

## 2023-08-20 MED ORDER — HYDRALAZINE HCL 20 MG/ML IJ SOLN
INTRAMUSCULAR | Status: AC
  Filled 2023-08-20: qty 1

## 2023-08-20 MED ORDER — FENTANYL CITRATE PF 50 MCG/ML IJ SOSY
25.0000 ug | PREFILLED_SYRINGE | INTRAMUSCULAR | Status: DC | PRN
  Administered 2023-08-20 (×2): 50 ug via INTRAVENOUS
  Filled 2023-08-20: qty 1

## 2023-08-20 MED ORDER — OXYCODONE HCL 5 MG PO TABS: 5.0000 mg | ORAL_TABLET | Freq: Once | ORAL | Status: DC | PRN

## 2023-08-20 MED ORDER — INSULIN ASPART 100 UNIT/ML IJ SOLN
0.0000 [IU] | Freq: Three times a day (TID) | INTRAMUSCULAR | Status: DC
  Administered 2023-08-21 (×2): 8 [IU] via SUBCUTANEOUS
  Administered 2023-08-21: 15 [IU] via SUBCUTANEOUS
  Administered 2023-08-22: 11 [IU] via SUBCUTANEOUS
  Administered 2023-08-22: 15 [IU] via SUBCUTANEOUS

## 2023-08-20 MED ORDER — LACTATED RINGERS IV SOLN: INTRAVENOUS | Status: DC | PRN

## 2023-08-20 MED ORDER — INSULIN GLARGINE-YFGN 100 UNIT/ML ~~LOC~~ SOLN
12.0000 [IU] | Freq: Two times a day (BID) | SUBCUTANEOUS | Status: DC
  Administered 2023-08-20 – 2023-08-22 (×4): 12 [IU] via SUBCUTANEOUS
  Filled 2023-08-20 (×6): qty 0.12

## 2023-08-20 MED ORDER — ORAL CARE MOUTH RINSE
15.0000 mL | Freq: Once | OROMUCOSAL | Status: AC
Start: 2023-08-20 — End: 2023-08-20

## 2023-08-20 MED ORDER — TRIPLE ANTIBIOTIC 3.5-400-5000 EX OINT
TOPICAL_OINTMENT | CUTANEOUS | Status: DC | PRN
  Administered 2023-08-20: 2 via SURGICAL_CAVITY

## 2023-08-20 MED ORDER — PROPOFOL 10 MG/ML IV BOLUS
INTRAVENOUS | Status: AC
  Filled 2023-08-20: qty 20

## 2023-08-20 SURGICAL SUPPLY — 47 items
BIT DRILL 2.8X128 (BIT) ×1 IMPLANT
BLADE SAGITTAL 25.0X1.27X90 (BLADE) ×1 IMPLANT
BNDG GAUZE ROLL STR 2.25X3YD (GAUZE/BANDAGES/DRESSINGS) IMPLANT
CHLORAPREP W/TINT 26 (MISCELLANEOUS) ×1 IMPLANT
CLOTH BEACON ORANGE TIMEOUT ST (SAFETY) ×1 IMPLANT
COUNTER NDL MAGNETIC 40 RED (SET/KITS/TRAYS/PACK) ×1 IMPLANT
COUNTER NEEDLE MAGNETIC 40 RED (SET/KITS/TRAYS/PACK) ×1 IMPLANT
COVER LIGHT HANDLE STERIS (MISCELLANEOUS) ×2 IMPLANT
DRAPE C-ARM FOLDED MOBILE STRL (DRAPES) IMPLANT
DRAPE HIP W/POCKET STRL (MISCELLANEOUS) ×1 IMPLANT
DRAPE STERI IOBAN 125X83 (DRAPES) IMPLANT
DRAPE SURG 17X23 STRL (DRAPES) ×1 IMPLANT
DRAPE U-SHAPE 47X51 STRL (DRAPES) ×1 IMPLANT
DRSG AQUACEL AG ADV 3.5X10 (GAUZE/BANDAGES/DRESSINGS) ×1 IMPLANT
DRSG MEPILEX SACRM 8.7X9.8 (GAUZE/BANDAGES/DRESSINGS) ×1 IMPLANT
DRSG TEGADERM 4X4.75 (GAUZE/BANDAGES/DRESSINGS) IMPLANT
ELECT BLADE 6 FLAT ULTRCLN (ELECTRODE) IMPLANT
ELECTRODE REM PT RTRN 9FT ADLT (ELECTROSURGICAL) ×1 IMPLANT
GLOVE BIO SURGEON STRL SZ8 (GLOVE) ×4 IMPLANT
GLOVE BIOGEL PI IND STRL 7.0 (GLOVE) ×2 IMPLANT
GLOVE BIOGEL PI IND STRL 8 (GLOVE) ×1 IMPLANT
GLOVE SURG POLYISO LF SZ8 (GLOVE) ×2 IMPLANT
GOWN STRL REUS W/TWL LRG LVL3 (GOWN DISPOSABLE) ×1 IMPLANT
GOWN STRL REUS W/TWL XL LVL3 (GOWN DISPOSABLE) ×3 IMPLANT
HEAD BIPOLAR PROS AML 45 (Hips) IMPLANT
HEAD FEM STD 28X+1.5 STRL (Hips) IMPLANT
INST SET MAJOR BONE (KITS) ×1 IMPLANT
KIT TURNOVER KIT A (KITS) ×1 IMPLANT
MANIFOLD NEPTUNE II (INSTRUMENTS) ×1 IMPLANT
MARKER SKIN DUAL TIP RULER LAB (MISCELLANEOUS) ×1 IMPLANT
NS IRRIG 1000ML POUR BTL (IV SOLUTION) ×1 IMPLANT
PACK TOTAL JOINT (CUSTOM PROCEDURE TRAY) ×1 IMPLANT
PAD ARMBOARD POSITIONER FOAM (MISCELLANEOUS) ×1 IMPLANT
PENCIL SMOKE EVACUATOR (MISCELLANEOUS) IMPLANT
POSITIONER HEAD 8X9X4 ADT (SOFTGOODS) ×1 IMPLANT
SET BASIN LINEN APH (SET/KITS/TRAYS/PACK) ×1 IMPLANT
SET HNDPC FAN SPRY TIP SCT (DISPOSABLE) ×1 IMPLANT
SOL .9 NS 3000ML IRR UROMATIC (IV SOLUTION) ×1 IMPLANT
STAPLER VISISTAT (STAPLE) IMPLANT
STEM FEMORAL SZ 5MM STD ACTIS (Stem) IMPLANT
SUT MON AB 2-0 CT1 36 (SUTURE) ×1 IMPLANT
SUT VIC AB 1 CT1 27XBRD ANTBC (SUTURE) ×4 IMPLANT
SYR 30ML LL (SYRINGE) ×2 IMPLANT
SYR BULB IRRIG 60ML STRL (SYRINGE) ×2 IMPLANT
TRAY FOLEY MTR SLVR 16FR STAT (SET/KITS/TRAYS/PACK) ×1 IMPLANT
WATER STERILE IRR 1000ML POUR (IV SOLUTION) ×2 IMPLANT
YANKAUER SUCT 12FT TUBE ARGYLE (SUCTIONS) ×1 IMPLANT

## 2023-08-20 NOTE — Anesthesia Procedure Notes (Addendum)
 Spinal  Patient location during procedure: OR Start time: 08/20/2023 12:40 PM End time: 08/20/2023 12:45 PM Reason for block: surgical anesthesia Staffing Performed: other anesthesia staff  Anesthesiologist: Coretha Dew, MD Resident/CRNA: Alex Hylan, CRNA Performed by: Alex Hylan, CRNA Authorized by: Coretha Dew, MD   Preanesthetic Checklist Completed: patient identified, IV checked, site marked, risks and benefits discussed, surgical consent, monitors and equipment checked, pre-op evaluation and timeout performed Spinal Block Patient position: left lateral decubitus Prep: ChloraPrep Patient monitoring: heart rate, cardiac monitor, continuous pulse ox and blood pressure Approach: midline Location: L3-4 Injection technique: single-shot Needle Needle type: Pencan  Needle gauge: 25 G Assessment Sensory level: T4 Events: CSF return Additional Notes Placed by Karrin Pae. One attempt.

## 2023-08-20 NOTE — Op Note (Addendum)
 Orthopaedic Surgery Operative Note (CSN: [id number])  Veronica Moses  Aug 17, 1955 Date of Surgery: 08/20/2023   Diagnoses:  Right femoral neck fracture  Procedure: Right Hip Hemiarthroplasty for a displaced femoral neck fracture (CPT [phone])   Operative Finding Successful completion of the planned procedure.  Right hip hemiarthroplasty, using an anterior approach on a Hana table.  Limb lengths were approximately equal.  Hip was stable, as confirmed under fluoroscopy.   Post-Op Diagnosis: Same Surgeons:Primary: Tonita Frater, MD Assistants: Isaac Manus Location: [facility] OR ROOM 4 Anesthesia: Sedation plus regional anesthesia Antibiotics: Ancef  2 g with local vancomycin  powder 1 g at the surgical site Tourniquet time: N/A Estimated Blood Loss: 200 cc Complications: None Specimens: None  Implants: Implant Name Type Inv. Item Serial No. Manufacturer Lot No. LRB No. Used Action  HEAD FEM STD 28X+1.5 STRL - [id number] Hips HEAD FEM STD 28X+1.5 STRL  [employer] [id number] Right 1 Implanted  HEAD BIPOLAR PROS AML 45 - [id number] Hips HEAD BIPOLAR PROS AML 45  [employer] [id number] Right 1 Implanted  STEM FEMORAL SZ STD ACTIS - [id number] Stem STEM FEMORAL SZ STD ACTIS  [employer] [id number] Right 1 Implanted    Indications for Surgery:   Veronica Moses is a 68 y.o. female who sustained a mechanical fall and has a Right femoral neck fracture.  In order to restore form and function, I have recommended surgery.  Benefits and risks of operative and nonoperative management were discussed prior to surgery with the patient and informed consent form was completed.  Specific risks including infection, need for additional surgery, fracture, dislocation, persistent pain, damage to surrounding structures including nerves and blood vessels, poor integration of the implants, blood clots and more severe complications associated with anesthesia.  All questions have been answered.  They elected  proceed with surgery.  Surgical consent was finalized.   Procedure:   The patient was identified properly. Informed consent was obtained and the surgical site was marked. The patient was taken to the OR where a spinal and sedation was induced.  The patient was positioned supine on a Hana table, with both feet in boots.  We confirmed appropriate position using fluoroscopy, prior to draping.  A Foley catheter was placed.  The right leg was prepped and draped in the usual sterile fashion.  Timeout was performed before the beginning of the case.  Patient received 2 g of Ancef  and 1 g of TXA prior to making incision.  The ASI was palpated, and marked.  We used this is our primary landmark.  We made a long contusional incision, just distal to the ASIS, extending distally over the anterior thigh.  We incised sharply through skin, then through subcutaneous tissue.  The fascia overlying the TFL was identified, and cleared.  Hemostasis was achieved.  We palpated the ASIS 1 more time, to confirm that we were indeed lateral, and in line with the TFL.  We then used a knife to incise sharply through the fascia across the extent of the incision.  We used pickups to develop a plane medially.  We then introduced a retractor to retract the underlying muscle laterally.  We are able to palpate the femoral head, and a Cobra was placed directly over the superior aspect of the femoral neck.  The crossing arteries were identified, cauterized.  We achieved hemostasis.  Fat overlying the capsule was removed using a rongeur.  We were then able to palpate the inferior aspect of the femoral neck.  An additional  Cobra retractor was placed on the inferior aspect of the femoral neck.  We identified the most superior portion of the vastus, and planned out our capsulotomy.  We incised obliquely extending from the inferior aspect of the femoral neck laterally, across the femoral neck to towards the femoral head.  We then completed a  horizontal incision at the most superior aspect of the vastus, towards the inferior femoral neck.  This was taken directly down to bone.  We then used a Cobb elevator, and replaced the superior cobra retractor intracapsular.  We then continued with Bovie cautery to release the medial portion of the hip capsule, and placed the inferior cobra intracapsular.  We continued to release the hip capsule superior and inferior so that we are able to palpate the lesser trochanter.  Both leaves of the capsule were then tagged for continued manipulation.  Superior saddle, as well as the lesser trochanter as guides, we planned out our femoral neck cut.  Retractors were then removed.  Fluoroscopy was then brought in, in order to confirm an appropriate femoral neck cut.  We completed the femoral neck cut using a saw inferiorly, and completed the cut using osteotome.  We then introduced a corkscrew within the cut portion of the femoral neck.  This was initially power, then transitioned to hand.  We manipulated the cut femoral neck and head until we are able to achieve full control.  Additional attachments of the capsule were released using Bovie cautery.  Subsequently, we were able to remove the femoral head.  The femoral head was measured on the back table, and determined to be 45 mm.    We then turned our attention the femur and plan for broaching of the femoral stem.  The leg was externally rotated approximately 120 degrees.  We placed a Mueller type retractor on the posterior aspect of the femoral neck cut.  Next, we placed the hook for the bed attachment underneath the femur, with the retractor extending laterally.  This was held in position manually at this point.  The superior leaf of the capsule was identified.  We then placed a Hohmann retractor between the capsule and the gluteus medius tendon, in order to protect the gluteus tendon.  We proceeded to release the remaining capsule down to the greater trochanter.  We then  proceeded to release additional soft tissue, including some of the short external rotators of the posterior aspect of the greater trochanter.  We then released some of the soft tissue attached to the superior aspect of the greater trochanter.  A retractor was then placed underneath the greater trochanter, and held laterally.  The bed attachment was secured within the rotating arm, and the femur was lifted using the bed attachment.  Under direct visualization, the leg was then very carefully extended and adducted to provide additional exposure of the femoral neck cut.  At this point, we were satisfied with our overall exposure.  We did use a hibbs retractor superiorly, in order to protect the skin, and the muscle belly further.  A box osteotome was introduced, to gain access to the femoral canal.  We then used a rasp as a canal finder.  We then sequentially reamed until we were able to achieve excellent fit of the femoral canal.  Between each subsequent broach, the canal was sounded to ensure that we do not breached the canal posterior or anterior.  We matched the native version.  We then trialed a neck, and femoral head.  Retractors  were removed.  The leg was repositioned, and ultimately reduced.  We confirmed appropriate alignment and fit under fluoroscopy.  We confirmed that there was no posterior cortex breach by externally rotating the leg 90 degrees, and confirming under fluoroscopy.  The femoral head was dislocated again, and retractors were replaced.  The trial implants were removed.  The femoral canal was irrigated copiously.  The above-stated stem was then opened, and inserted via hand, until we started to experience resistance.  The stem was then impacted within the femoral canal.  We achieved excellent fit.  There was good stability.  The above stated femoral head was then placed, and impacted on the femoral stem.  Retractors were removed, and the hip was reduced once again.  Overall appearance of the hip  was confirmed under fluoroscopy.  We are satisfied with our alignment and fit within the femoral canal.  Limb lengths were approximately equal, based on imaging.  The hip was irrigated using Pulsavac irrigation.  We have previously tagged the hip capsule, and the sutures were used to close the capsule.  Again we irrigated the wound copiously.  We then closed the fascia with a running 0 Vicryl.  Vancomycin  was placed within the surgical incision.  Skin was closed in a layered fashion with 2-0 Monocryl, and staples.  Incision was covered with triple antibiotic ointment and an Aquacel dressing.  Patient was awoken taken to PACU in stable condition.   Post-operative plan:  The patient will be WBAT on the operative extremity.  No restrictions. Patient will be admitted to the floor for overnight observation. Evaluation by PT/OT DVT prophylaxis per primary team, no orthopedic contraindications.   Ok to begin 24 hours postop Pain control with PRN pain medication preferring oral medicines.   Follow up plan will be scheduled in approximately 10-14 days for incision check and XR.

## 2023-08-20 NOTE — Anesthesia Preprocedure Evaluation (Signed)
 Anesthesia Evaluation  Patient identified by MRN, date of birth, ID band Patient awake    Reviewed: Allergy & Precautions, H&P , NPO status , Patient's Chart, lab work & pertinent test results, reviewed documented beta blocker date and time   Airway Mallampati: II  TM Distance: >3 FB Neck ROM: full    Dental no notable dental hx.    Pulmonary neg pulmonary ROS, shortness of breath, pneumonia, COPD, Current Smoker   Pulmonary exam normal breath sounds clear to auscultation       Cardiovascular Exercise Tolerance: Good hypertension, negative cardio ROS + Valvular Problems/Murmurs  Rhythm:regular Rate:Normal     Neuro/Psych Seizures -,  PSYCHIATRIC DISORDERS Anxiety Depression   Dementia CVA negative neurological ROS  negative psych ROS   GI/Hepatic negative GI ROS, Neg liver ROS,GERD  ,,  Endo/Other  negative endocrine ROSdiabetes, Poorly Controlled, Type 2    Renal/GU Renal diseasenegative Renal ROS  negative genitourinary   Musculoskeletal   Abdominal   Peds  Hematology negative hematology ROS (+)   Anesthesia Other Findings   Reproductive/Obstetrics negative OB ROS                             Anesthesia Physical Anesthesia Plan  ASA: 3  Anesthesia Plan: Spinal   Post-op Pain Management:    Induction:   PONV Risk Score and Plan: Propofol  infusion  Airway Management Planned:   Additional Equipment:   Intra-op Plan:   Post-operative Plan:   Informed Consent: I have reviewed the patients History and Physical, chart, labs and discussed the procedure including the risks, benefits and alternatives for the proposed anesthesia with the patient or authorized representative who has indicated his/her understanding and acceptance.     Dental Advisory Given  Plan Discussed with: CRNA  Anesthesia Plan Comments:         Anesthesia Quick Evaluation

## 2023-08-20 NOTE — Progress Notes (Signed)
   ORTHOPAEDIC PROGRESS NOTE  Scheduled for Right Hip hemiarthroplasty  DOS: 08/20/23  SUBJECTIVE: Transferred to stepdown due to elevated blood glucose.  Continues to have pain in the right hip.  She is ready for surgery  OBJECTIVE: PE:  Alert.  Some confusion  Right hip in fixed position Toes are warm and well perfused Sensation intact to the dorsum of her foot Active EHL/TA  Vitals:   08/20/23 1155 08/20/23 1200  BP:    Pulse: 78 78  Resp: 18 17  Temp:    SpO2: 94% 93%      Latest Ref Rng & Units 08/20/2023    4:36 AM 08/19/2023    4:28 AM 08/18/2023    7:11 PM  CBC  WBC 4.0 - 10.5 K/uL 14.6  9.6  10.2   Hemoglobin 12.0 - 15.0 g/dL 16.1  09.6  04.5   Hematocrit 36.0 - 46.0 % 36.8  40.3  37.1   Platelets 150 - 400 K/uL 320  282  321      ASSESSMENT: Veronica Moses is a 68 y.o. female doing well.  Ready for OR.  NPO since midnight.   PLAN: Weightbearing: NWB RLE Incisional and dressing care: Reinforce dressings as needed; none currently Orthopedic device(s): None VTE prophylaxis: Recommend Aspirin  81mg  BID; to begin POD#1 Pain control: PO pain medications as needed; judicious use of narcotics Follow - up plan: 2 weeks   Contact information:     Corbitt Cloke A. Ernesta Heading, MD MS [facility] [address] [place],  [place]  [address] Phone: [phone] Fax: [phone]

## 2023-08-20 NOTE — Transfer of Care (Signed)
 Immediate Anesthesia Transfer of Care Note  Patient: Veronica Moses  Procedure(s) Performed: HEMIARTHROPLASTY (BIPOLAR), HIP, DIRECT ANTERIOR APPROACH, FOR FRACTURE (Right: Hip)  Patient Location: PACU  Anesthesia Type:Spinal  Level of Consciousness: awake and alert   Airway & Oxygen  Therapy: Patient Spontanous Breathing and Patient connected to face mask oxygen   Post-op Assessment: Report given to RN and Post -op Vital signs reviewed and stable  Post vital signs: Reviewed and stable  Last Vitals:  Vitals Value Taken Time  BP 162/57   Temp 97.5   Pulse 85 08/20/23 1517  Resp 23 08/20/23 1517  SpO2 90 % 08/20/23 1517  Vitals shown include unfiled device data.  Last Pain:  Vitals:   08/20/23 1212  TempSrc:   PainSc: 8       Patients Stated Pain Goal: 2 (08/19/23 1519)  Complications: No notable events documented.

## 2023-08-20 NOTE — Plan of Care (Signed)

## 2023-08-20 NOTE — Progress Notes (Signed)
 [facility] PROGRESS NOTE   Veronica Moses OZH:[id number] DOB: 27-Dec-1955 DOA: 08/18/2023  PCP: Wendi Ham, NP  Brief History: 68 y.o. female with medical history significant of COPD, diabetes mellitus, seizure disorder, tobacco abuse who presented to the emergency department due to fall sustained at home last night.  She states that she lost balance after being bumped into by her dog.  She had persistent pain in the right hip area and was finding it difficult to walk.  She decided to come into the emergency department via ambulance.  Found to have a right hip fracture.  She was hospitalized for further management.    Consultants: Orthopedics  Procedures: None   Subjective/Interval History: Significant hyperglycemia overnight requiring the use of insulin  drip; still complaining of pain in her right lower extremity.  Assessment/Plan:  Right hip fracture -Management per orthopedics; plan is for surgical repair later today. -Patient does have history of COPD and is a current smoker as well.  Does not use oxygen  at home.  Chest x-ray did not show any acute findings.  EKG without acute ischemic changes. -Patient Will need aggressive pulmonary toilet after surgery.  COPD with concern for acute exacerbation -Posterior rhonchi and wheezing on exam - Continue bronchodilator management and the use of his steroids. - Wean off oxygen  supplementation as tolerated.  Hypertensive urgency -Most likely associated with pain - Blood pressure better controlled - Continue treatment with hydralazine  and losartan   History of seizures -No seizures appreciated - Continue Keppra .  Diabetes mellitus type 2 with hyperglycemia/honk -Most likely triggered by the use of his steroids for COPD and also stressed response for an acute fracture. -HbA1c was 8.9 in May.  - Patient ended requiring insulin  drip overnight to further stabilize blood sugar levels - Planning to transition her off insulin  drip  and start treatment with a sliding scale and Semglee  along with modified carbohydrate diet after surgical intervention.  Hypokalemia/hypomagnesemia -Continue repletion and follow electrolytes trend - Continue telemetry monitoring.  Hyperlipidemia - Continue statin.  History of dementia/depression - Continue supportive care - Continue Lexapro  and Aricept  - Constant reorientation will be continued - Patient at high risk for hospital-acquired delirium.  DVT Prophylaxis: Definitive prophylaxis after surgery. Code Status: Full code Family Communication: Discussed with patient's daughter and husband at bedside. Disposition Plan: To be determined  Status is: Inpatient Remains inpatient appropriate because: Right hip fracture   Medications: Scheduled:  [MAR Hold] ALPRAZolam   0.5 mg Oral QHS   [MAR Hold] budesonide (PULMICORT) nebulizer solution  0.25 mg Nebulization BID   chlorhexidine        [MAR Hold] Chlorhexidine  Gluconate Cloth  6 each Topical Daily   [MAR Hold] dextromethorphan-guaiFENesin  1 tablet Oral BID   [MAR Hold] donepezil   10 mg Oral QHS   [MAR Hold] escitalopram   20 mg Oral Daily   [MAR Hold] feeding supplement (GLUCERNA SHAKE)  237 mL Oral TID BM   fentaNYL        [MAR Hold] levalbuterol  0.63 mg Nebulization TID   [MAR Hold] levETIRAcetam   1,000 mg Oral BID   [MAR Hold] losartan   50 mg Oral Daily   [MAR Hold] methylPREDNISolone  (SOLU-MEDROL ) injection  40 mg Intravenous Q12H   [MAR Hold] nicotine   14 mg Transdermal Daily   [MAR Hold] pantoprazole   40 mg Oral Daily   [MAR Hold] rosuvastatin  20 mg Oral Daily   Continuous:  ceFAZolin      dextrose  5% lactated ringers  125 mL/hr at 08/20/23 1116   insulin  2.8 Units/hr (  08/20/23 1447)   lactated ringers  Stopped (08/20/23 0226)   lactated ringers  10 mL/hr at 08/20/23 1207   PRN:0.9 % irrigation (POUR BTL), [MAR Hold] acetaminophen  **OR** [MAR Hold] acetaminophen , ceFAZolin , chlorhexidine , [MAR Hold] dextrose ,  fentaNYL , fentaNYL  (SUBLIMAZE ) injection, [MAR Hold] hydrALAZINE , [MAR Hold]  morphine  injection, neomycin-bacitracin-polymyxin, [MAR Hold] mouth rinse, oxyCODONE  **OR** oxyCODONE , [MAR Hold] prochlorperazine , sodium chloride  irrigation, sterile water , vancomycin   Antibiotics: Anti-infectives (From admission, onward)    Start     Dose/Rate Route Frequency Ordered Stop   08/20/23 1435  vancomycin  (VANCOCIN ) powder          As needed 08/20/23 1500     08/20/23 1400  [MAR Hold]  ceFAZolin  (ANCEF ) IVPB 2g/100 mL premix        (MAR Hold since Thu 08/20/2023 at 1134.Hold Reason: Transfer to a Procedural area)   2 g 200 mL/hr over 30 Minutes Intravenous On call to O.R. 08/19/23 0938 08/20/23 1320   08/20/23 1216  ceFAZolin  (ANCEF ) 2-4 GM/100ML-% IVPB       Note to Pharmacy: Lind Repine S: cabinet override      08/20/23 1216 08/21/23 0029       Objective:  Vital Signs  Vitals:   08/20/23 1221 08/20/23 1515 08/20/23 1530 08/20/23 1545  BP:  (!) 162/57 (!) 157/52 (!) 175/49  Pulse: 78 80 79 80  Resp: 16 18 17 17   Temp:  (!) 97.5 F (36.4 C)    TempSrc:      SpO2: 93% 92% 95% 97%  Weight:      Height:        Intake/Output Summary (Last 24 hours) at 08/20/2023 1553 Last data filed at 08/20/2023 1502 Gross per 24 hour  Intake 3047.23 ml  Output 3200 ml  Net -152.77 ml   Filed Weights   08/18/23 1816 08/18/23 2322  Weight: 58 kg 58.6 kg   General exam: Alert, awake, oriented x 2 intermittently; expressing to be hungry and complaining of pain in her right hip. Respiratory system: Positive rhonchi at bilateral expiratory wheezing appreciated on exam.  Good saturation on 2 L supplementation.  No using accessory muscles. Cardiovascular system:RRR. No rubs or gallops. Gastrointestinal system: Abdomen is nondistended, soft and nontender. No organomegaly or masses felt. Normal bowel sounds heard. Central nervous system:No focal neurological deficits. Extremities: No cyanosis or clubbing;  decreased range of motion and pain in her right lower extremity.  Right lower extremity externally rotated on exam. Skin: No petechiae. Psychiatry: Judgement and insight appear in fair secondary to underlying dementia.   Lab Results:  Data Reviewed: I have personally reviewed following labs and reports of the imaging studies  CBC: Recent Labs  Lab 08/18/23 1911 08/19/23 0428 08/20/23 0436  WBC 10.2 9.6 14.6*  NEUTROABS 7.5  --   --   HGB 12.2 12.7 11.7*  HCT 37.1 40.3 36.8  MCV 90.0 90.8 90.6  PLT 321 282 320    Basic Metabolic Panel: Recent Labs  Lab 08/18/23 1911 08/19/23 0428 08/19/23 2158 08/20/23 0436  NA 134* 134* 128* 140  K 2.8* 3.7 4.1 4.2  CL 102 100 94* 104  CO2 26 26 23 28   GLUCOSE 274* 332* 542* 176*  BUN 9 9 16 15   CREATININE 0.82 0.84 0.88 0.74  CALCIUM 8.9 8.5* 8.3* 8.7*  MG  --  1.6* 2.3  --   PHOS  --  2.5  --   --     GFR: Estimated Creatinine Clearance: 55.7 mL/min (by C-G formula based on  SCr of 0.74 mg/dL).  Liver Function Tests: Recent Labs  Lab 08/18/23 1911 08/19/23 0428  AST 30 33  ALT 23 25  ALKPHOS 29* 28*  BILITOT 0.5 0.6  PROT 7.0 7.4  ALBUMIN 3.4* 3.5    CBG: Recent Labs  Lab 08/20/23 1155 08/20/23 1248 08/20/23 1348 08/20/23 1446 08/20/23 1536  GLUCAP 174* 134* 155* 153* 121*    Radiology Studies: DG HIP UNILAT WITH PELVIS 2-3 VIEWS RIGHT Result Date: 08/20/2023 CLINICAL DATA:  Hemiarthroplasty of right hip. EXAM: DG HIP (WITH OR WITHOUT PELVIS) 2-3V RIGHT COMPARISON:  None Available. FINDINGS: Six fluoroscopic spot views of the pelvis and right hip obtained in the operating room. Sequential images during hip arthroplasty. Fluoroscopy time 19 seconds. Dose 2.506 mGy. IMPRESSION: Intraoperative fluoroscopy during right hip arthroplasty. Electronically Signed   By: Chadwick Colonel M.D.   On: 08/20/2023 15:05   DG C-Arm 1-60 Min-No Report Result Date: 08/20/2023 Fluoroscopy was utilized by the requesting physician.   No radiographic interpretation.   DG C-Arm 1-60 Min-No Report Result Date: 08/20/2023 Fluoroscopy was utilized by the requesting physician.  No radiographic interpretation.   CT Angio Chest PE W and/or Wo Contrast Result Date: 08/19/2023 CLINICAL DATA:  Fall, right femoral neck fracture, positive D-dimer EXAM: CT ANGIOGRAPHY CHEST WITH CONTRAST TECHNIQUE: Multidetector CT imaging of the chest was performed using the standard protocol during bolus administration of intravenous contrast. Multiplanar CT image reconstructions and MIPs were obtained to evaluate the vascular anatomy. RADIATION DOSE REDUCTION: This exam was performed according to the departmental dose-optimization program which includes automated exposure control, adjustment of the mA and/or kV according to patient size and/or use of iterative reconstruction technique. CONTRAST:  75mL OMNIPAQUE  IOHEXOL  350 MG/ML SOLN COMPARISON:  Chest radiograph dated 08/18/2023 FINDINGS: Cardiovascular: Satisfactory opacification the bilateral pulmonary arteries to the segmental level. No evidence of pulmonary embolism. Although not tailored for evaluation of the thoracic aorta, there is no evidence of thoracic aortic aneurysm or dissection. Mild atherosclerotic calcifications of the aortic arch. Mild cardiomegaly.  No pericardial effusion. Moderate three-vessel coronary atherosclerosis. Mediastinum/Nodes: No suspicious mediastinal lymphadenopathy. Visualized thyroid  is unremarkable. Lungs/Pleura: Evaluation of the lung parenchyma is constrained by respiratory motion. Within that constraint, there are no suspicious pulmonary nodules. Mild patchy opacities in the dependent upper and lower lobes, likely atelectasis. Additional platelike atelectasis inferiorly in the right upper lobe. No focal consolidation. No pleural effusion or pneumothorax. Upper Abdomen: Visualized upper abdomen is notable for prior cholecystectomy and vascular calcifications. Left kidney is not  visualized, reportedly surgically absent. Musculoskeletal: Visualized osseous structures are within normal limits. Review of the MIP images confirms the above findings. IMPRESSION: No evidence of pulmonary embolism. Mild cardiomegaly.  Scattered atelectasis. Aortic Atherosclerosis (ICD10-I70.0). Electronically Signed   By: Zadie Herter M.D.   On: 08/19/2023 02:18   CT Hip Right Wo Contrast Result Date: 08/18/2023 CLINICAL DATA:  Trauma. EXAM: CT OF THE RIGHT HIP WITHOUT CONTRAST TECHNIQUE: Multidetector CT imaging of the right hip was performed according to the standard protocol. Multiplanar CT image reconstructions were also generated. RADIATION DOSE REDUCTION: This exam was performed according to the departmental dose-optimization program which includes automated exposure control, adjustment of the mA and/or kV according to patient size and/or use of iterative reconstruction technique. COMPARISON:  X-ray same day FINDINGS: Bones/Joint/Cartilage The bones are osteopenic. There is a mildly impacted transverse fracture of the right femoral neck. There is no dislocation. Joint effusion is present. Ligaments Suboptimally assessed by CT. Muscles and Tendons No focal hematoma.  Soft tissues No focal hematoma or fluid collection. IMPRESSION: Mildly impacted transverse fracture of the right femoral neck. Electronically Signed   By: Tyron Gallon M.D.   On: 08/18/2023 21:33   CT Lumbar Spine Wo Contrast Result Date: 08/18/2023 CLINICAL DATA:  Low back pain EXAM: CT LUMBAR SPINE WITHOUT CONTRAST TECHNIQUE: Multidetector CT imaging of the lumbar spine was performed without intravenous contrast administration. Multiplanar CT image reconstructions were also generated. RADIATION DOSE REDUCTION: This exam was performed according to the departmental dose-optimization program which includes automated exposure control, adjustment of the mA and/or kV according to patient size and/or use of iterative reconstruction technique.  COMPARISON:  None Available. FINDINGS: Segmentation: 5 lumbar type vertebrae. Alignment: Normal. Vertebrae: No acute fracture or focal pathologic process. Paraspinal and other soft tissues: Negative. Surgical changes of left nephrectomy noted. Disc levels: Intervertebral disc heights are preserved. No significant neuroforaminal narrowing. Mild to moderate facet arthrosis, most severe at L2-3 and L4-5. Mild multifactorial central canal stenosis at L2-L5,. The osseous structures are diffusely osteopenic IMPRESSION: 1. No acute osseous abnormality of the lumbar spine. 2. Mild to moderate facet arthrosis, most severe at L2-3 and L4-5. 3. Mild multifactorial central canal stenosis at L2-L5. Electronically Signed   By: Worthy Heads M.D.   On: 08/18/2023 20:29   DG Chest Portable 1 View Result Date: 08/18/2023 CLINICAL DATA:  Status post fall, right hip fracture. EXAM: PORTABLE CHEST 1 VIEW COMPARISON:  08/03/2023 FINDINGS: Stable cardiomegaly. Unchanged mediastinal contours. No acute airspace disease, pulmonary edema, large pleural effusion or pneumothorax. On limited assessment, no acute osseous findings. IMPRESSION: Stable cardiomegaly. No acute findings. Electronically Signed   By: Chadwick Colonel M.D.   On: 08/18/2023 19:35   DG Hip Unilat W or Wo Pelvis 2-3 Views Right Result Date: 08/18/2023 CLINICAL DATA:  Right hip pain after fall.  Unable to bear weight. EXAM: DG HIP (WITH OR WITHOUT PELVIS) 2-3V RIGHT COMPARISON:  None Available. FINDINGS: Impacted right femoral neck fracture. No significant displacement. The femoral head remains seated. The bones are diffusely under mineralized. Pubic rami are intact. Pubic symphysis and sacroiliac joints are congruent. IMPRESSION: Impacted right femoral neck fracture. Electronically Signed   By: Chadwick Colonel M.D.   On: 08/18/2023 19:34     LOS: 2 days   Justina Oman  [facility] Pager on [email or web address]  08/20/2023, 3:53 PM

## 2023-08-20 NOTE — Anesthesia Postprocedure Evaluation (Signed)
 Anesthesia Post Note  Patient: Jojo Geving  Procedure(s) Performed: HEMIARTHROPLASTY (BIPOLAR), HIP, DIRECT ANTERIOR APPROACH, FOR FRACTURE (Right: Hip)  Patient location during evaluation: Phase II Anesthesia Type: Spinal Level of consciousness: awake Pain management: pain level controlled Vital Signs Assessment: post-procedure vital signs reviewed and stable Respiratory status: spontaneous breathing and respiratory function stable Cardiovascular status: blood pressure returned to baseline and stable Postop Assessment: no headache and no apparent nausea or vomiting Anesthetic complications: no Comments: Late entry   No notable events documented.   Last Vitals:  Vitals:   08/20/23 1545 08/20/23 1600  BP: (!) 175/49   Pulse: 80 79  Resp: 17 17  Temp:    SpO2: 97% 97%    Last Pain:  Vitals:   08/20/23 1545  TempSrc:   PainSc: 10-Worst pain ever                 Coretha Dew

## 2023-08-21 ENCOUNTER — Encounter (HOSPITAL_COMMUNITY): Payer: Self-pay | Admitting: Orthopedic Surgery

## 2023-08-21 DIAGNOSIS — E1165 Type 2 diabetes mellitus with hyperglycemia: Secondary | ICD-10-CM | POA: Diagnosis not present

## 2023-08-21 DIAGNOSIS — S72001D Fracture of unspecified part of neck of right femur, subsequent encounter for closed fracture with routine healing: Secondary | ICD-10-CM

## 2023-08-21 LAB — GLUCOSE, CAPILLARY
Glucose-Capillary: 411 mg/dL — ABNORMAL HIGH (ref 70–99)
Glucose-Capillary: 181 mg/dL — ABNORMAL HIGH (ref 70–99)
Glucose-Capillary: 274 mg/dL — ABNORMAL HIGH (ref 70–99)
Glucose-Capillary: 380 mg/dL — ABNORMAL HIGH (ref 70–99)
Glucose-Capillary: 252 mg/dL — ABNORMAL HIGH (ref 70–99)
Glucose-Capillary: 290 mg/dL — ABNORMAL HIGH (ref 70–99)

## 2023-08-21 LAB — BASIC METABOLIC PANEL WITH GFR
Anion gap: 9 (ref 5–15)
Anion gap: 9 (ref 5–15)
BUN: 16 mg/dL (ref 8–23)
BUN: 17 mg/dL (ref 8–23)
CO2: 25 mmol/L (ref 22–32)
CO2: 23 mmol/L (ref 22–32)
Calcium: 7.6 mg/dL — ABNORMAL LOW (ref 8.9–10.3)
Calcium: 8.1 mg/dL — ABNORMAL LOW (ref 8.9–10.3)
Chloride: 106 mmol/L (ref 98–111)
Chloride: 102 mmol/L (ref 98–111)
Creatinine, Ser: 0.66 mg/dL (ref 0.44–1.00)
Creatinine, Ser: 0.68 mg/dL (ref 0.44–1.00)
GFR, Estimated: >60 mL/min (ref >60)
GFR, Estimated: >60 mL/min (ref >60)
Glucose, Bld: 137 mg/dL — ABNORMAL HIGH (ref 70–99)
Glucose, Bld: 262 mg/dL — ABNORMAL HIGH (ref 70–99)
Potassium: 3.9 mmol/L (ref 3.5–5.1)
Potassium: 3.7 mmol/L (ref 3.5–5.1)
Sodium: 140 mmol/L (ref 135–145)
Sodium: 134 mmol/L — ABNORMAL LOW (ref 135–145)

## 2023-08-21 LAB — CBC
HCT: 32.4 % — ABNORMAL LOW (ref 36.0–46.0)
Hemoglobin: 10 g/dL — ABNORMAL LOW (ref 12.0–15.0)
MCH: 28.7 pg (ref 26.0–34.0)
MCHC: 30.9 g/dL (ref 30.0–36.0)
MCV: 92.8 fL (ref 80.0–100.0)
Platelets: 347 10*3/uL (ref 150–400)
RBC: 3.49 MIL/uL — ABNORMAL LOW (ref 3.87–5.11)
RDW: 17 % — ABNORMAL HIGH (ref 11.5–15.5)
WBC: 14.5 10*3/uL — ABNORMAL HIGH (ref 4.0–10.5)
nRBC: 0 % (ref 0.0–0.2)

## 2023-08-21 LAB — MAGNESIUM: Magnesium: 2 mg/dL (ref 1.7–2.4)

## 2023-08-21 LAB — VITAMIN D 25 HYDROXY (VIT D DEFICIENCY, FRACTURES): Vit D, 25-Hydroxy: 39.54 ng/mL (ref 30–100)

## 2023-08-21 MED ORDER — OXYCODONE HCL 5 MG PO TABS: 5.0000 mg | ORAL_TABLET | ORAL | Status: DC | PRN

## 2023-08-21 MED ORDER — ALPRAZOLAM 1 MG PO TABS: 1.0000 mg | ORAL_TABLET | Freq: Every day | ORAL | Status: DC

## 2023-08-21 MED ORDER — SENNA 8.6 MG PO TABS
1.0000 | ORAL_TABLET | Freq: Every day | ORAL | Status: DC
  Administered 2023-08-21 – 2023-08-22 (×2): 8.6 mg via ORAL
  Filled 2023-08-21 (×2): qty 1

## 2023-08-21 MED ORDER — INSULIN ASPART 100 UNIT/ML IJ SOLN
3.0000 [IU] | Freq: Three times a day (TID) | INTRAMUSCULAR | Status: DC
  Administered 2023-08-21 – 2023-08-22 (×3): 3 [IU] via SUBCUTANEOUS

## 2023-08-21 MED ORDER — ACETAMINOPHEN 650 MG RE SUPP: 650.0000 mg | Freq: Four times a day (QID) | RECTAL | Status: DC

## 2023-08-21 MED ORDER — MORPHINE SULFATE (PF) 2 MG/ML IV SOLN
2.0000 mg | INTRAVENOUS | Status: DC | PRN
  Administered 2023-08-22: 2 mg via INTRAVENOUS
  Filled 2023-08-21: qty 1

## 2023-08-21 MED ORDER — OXYCODONE HCL 5 MG PO TABS: 2.5000 mg | ORAL_TABLET | ORAL | Status: DC | PRN

## 2023-08-21 MED ORDER — CALCIUM CITRATE 950 (200 CA) MG PO TABS: 600.0000 mg | ORAL_TABLET | Freq: Once | ORAL | Status: DC

## 2023-08-21 MED ORDER — ALPRAZOLAM 0.5 MG PO TABS
0.5000 mg | ORAL_TABLET | Freq: Once | ORAL | Status: AC
  Administered 2023-08-21: 0.5 mg via ORAL
  Filled 2023-08-21: qty 1

## 2023-08-21 MED ORDER — ACETAMINOPHEN 325 MG PO TABS
650.0000 mg | ORAL_TABLET | Freq: Four times a day (QID) | ORAL | Status: DC
  Administered 2023-08-21 – 2023-08-22 (×4): 650 mg via ORAL
  Filled 2023-08-21 (×4): qty 2

## 2023-08-21 NOTE — Hospital Course (Addendum)
 Pain was tolerable when walking with PT. Walk down the hall and come back. Used walker.   No sob  No dyspnea with walking. No chest pain.   No O2 at home.   No swelling in LE.   RLE no swelling around bandage site.   Corrected calcium 8

## 2023-08-21 NOTE — Progress Notes (Addendum)
 [facility] PROGRESS NOTE   Veronica Moses ZOX:[id number] DOB: 01/27/1956 DOA: 08/18/2023  PCP: Wendi Ham, NP  Brief History: 68 y.o. female with medical history significant of COPD, diabetes mellitus, seizure disorder, tobacco abuse who presented to the emergency department due to fall sustained at home last night.  She states that she lost balance after being bumped into by her dog.  She had persistent pain in the right hip area and was finding it difficult to walk.  She decided to come into the emergency department via ambulance.  Found to have a right hip fracture.  She was hospitalized for further management.    Consultants: Orthopedics  Procedures: None   Subjective/Interval History: No complaints overnight. Pain was tolerable when walking with PT. Walked down the hall and came back using walker.   No sob.  No dyspnea with walking. No chest pain.   Assessment/Plan:  Right hip fracture -s/p R hip hemiarthroplasty with orthopedics -Pain control, PT/OT   COPD with concern for acute exacerbation Acute respiratory failure with hypoxia (resolved) - Wheezing on admission exam  - Continue bronchodilator management and the use of his steroids. - Wean off oxygen  supplementation as tolerated. -IS/flutter  Hypertensive urgency POA  -Most likely associated with pain - Blood pressure better controlled - Continue treatment with hydralazine  and losartan   History of seizures -No seizures appreciated - Continue Keppra .  Leukocytosis  Likely reactive in setting of recent or. Possibly related to steroids as well.    Diabetes mellitus type 2 with hyperglycemia/honk -Most likely triggered by the use of his steroids for COPD and also stress in setting of an acute fracture. -HbA1c was 8.9 07/2023 Remains poorly controlled.  CBG (last 3)  Recent Labs    08/21/23 0222 08/21/23 0709 08/21/23 1111  GLUCAP 181* 274* 380*   Will semglee  BID dosing  Add meal time coverage   Continue SSI  Hypocalcemia Normal albumin. Magnesium  WNL. Vit D pending.  -Recheck calcium  -F/u phos -F/u Vitamin D   Normocytic anemia  Worsening in the setting of recent OR.     Latest Ref Rng & Units 08/21/2023    4:06 AM 08/20/2023    4:36 AM 08/19/2023    4:28 AM  CBC  WBC 4.0 - 10.5 K/uL 14.5  14.6  9.6   Hemoglobin 12.0 - 15.0 g/dL 81.1  91.4  78.2   Hematocrit 36.0 - 46.0 % 32.4  36.8  40.3   Platelets 150 - 400 K/uL 347  320  282    -F/u iron  panel   Hypokalemia/hypomagnesemia (Resolved)  Hyperlipidemia - Continue statin.  History of dementia/depression - Continue supportive care - Continue Lexapro  and Aricept  - Constant reorientation will be continued - Patient at high risk for hospital-acquired delirium.  DVT Prophylaxis: Definitive prophylaxis after surgery. Code Status: Full code Family Communication: Discussed with patient's daughter and husband at bedside. Disposition Plan: To be determined  Status is: Inpatient Remains inpatient appropriate because: Right hip fracture   Medications: Scheduled:  acetaminophen   650 mg Oral Q6H   Or   acetaminophen   650 mg Rectal Q6H   ALPRAZolam   0.5 mg Oral QHS   budesonide  (PULMICORT ) nebulizer solution  0.25 mg Nebulization BID   Chlorhexidine  Gluconate Cloth  6 each Topical Daily   dextromethorphan -guaiFENesin   1 tablet Oral BID   donepezil   10 mg Oral QHS   escitalopram   20 mg Oral Daily   feeding supplement (GLUCERNA SHAKE)  237 mL Oral TID BM   insulin  aspart  0-15 Units Subcutaneous TID WC   insulin  aspart  0-5 Units Subcutaneous QHS   insulin  glargine-yfgn  12 Units Subcutaneous BID   levalbuterol   0.63 mg Nebulization TID   levETIRAcetam   1,000 mg Oral BID   losartan   50 mg Oral Daily   methylPREDNISolone  (SOLU-MEDROL ) injection  40 mg Intravenous Q12H   nicotine   14 mg Transdermal Daily   pantoprazole   40 mg Oral Daily   rosuvastatin   20 mg Oral Daily   senna  1 tablet Oral Daily    Continuous:   PRN:dextrose , hydrALAZINE , morphine  injection, mouth rinse, oxyCODONE  **OR** oxyCODONE , prochlorperazine   Antibiotics: Anti-infectives (From admission, onward)    Start     Dose/Rate Route Frequency Ordered Stop   08/20/23 2000  ceFAZolin  (ANCEF ) IVPB 2g/100 mL premix        2 g 200 mL/hr over 30 Minutes Intravenous Every 8 hours 08/20/23 1618 08/21/23 1447   08/20/23 1435  vancomycin  (VANCOCIN ) powder  Status:  Discontinued          As needed 08/20/23 1500 08/20/23 1618   08/20/23 1400  ceFAZolin  (ANCEF ) IVPB 2g/100 mL premix        2 g 200 mL/hr over 30 Minutes Intravenous On call to O.R. 08/19/23 0938 08/20/23 1320   08/20/23 1216  ceFAZolin  (ANCEF ) 2-4 GM/100ML-% IVPB       Note to Pharmacy: Lind Repine S: cabinet override      08/20/23 1216 08/21/23 0029       Objective:  Vital Signs  Vitals:   08/21/23 0825 08/21/23 1110 08/21/23 1353 08/21/23 1450  BP:  (!) 158/50  (!) 188/59  Pulse:  73  72  Resp:  16  15  Temp:  98 F (36.7 C)  98 F (36.7 C)  TempSrc:  Oral  Oral  SpO2: 94% 97% 96% 91%  Weight:      Height:        Intake/Output Summary (Last 24 hours) at 08/21/2023 1610 Last data filed at 08/21/2023 0102 Gross per 24 hour  Intake 193.09 ml  Output 250 ml  Net -56.91 ml   Filed Weights   08/18/23 1816 08/18/23 2322  Weight: 58 kg 58.6 kg    Physical Exam  Constitutional: In no distress.  Cardiovascular: Normal rate, regular rhythm. No lower extremity edema  Pulmonary: Non labored breathing on [place], no wheezing or rales.   Abdominal: Soft. Normal bowel sounds. Non distended and non tender Musculoskeletal: RLE with bandage over incision. No surrounding erythema, induration, or purulent drainage.  Neurological: Alert and oriented to person, place, and time. Non focal  Skin: Skin is warm and dry.   Lab Results:  Data Reviewed: I have personally reviewed following labs and reports of the imaging studies  CBC: Recent Labs  Lab  08/18/23 1911 08/19/23 0428 08/20/23 0436 08/21/23 0406  WBC 10.2 9.6 14.6* 14.5*  NEUTROABS 7.5  --   --   --   HGB 12.2 12.7 11.7* 10.0*  HCT 37.1 40.3 36.8 32.4*  MCV 90.0 90.8 90.6 92.8  PLT 321 282 320 347    Basic Metabolic Panel: Recent Labs  Lab 08/18/23 1911 08/19/23 0428 08/19/23 2158 08/20/23 0436 08/21/23 0406 08/21/23 1037  NA 134* 134* 128* 140 140  --   K 2.8* 3.7 4.1 4.2 3.9  --   CL 102 100 94* 104 106  --   CO2 26 26 23 28 25   --   GLUCOSE 274* 332* 542* 176* 137*  --  BUN 9 9 16 15 16   --   CREATININE 0.82 0.84 0.88 0.74 0.66  --   CALCIUM  8.9 8.5* 8.3* 8.7* 7.6*  --   MG  --  1.6* 2.3  --   --  2.0  PHOS  --  2.5  --   --   --   --     GFR: Estimated Creatinine Clearance: 55.7 mL/min (by C-G formula based on SCr of 0.66 mg/dL).  Liver Function Tests: Recent Labs  Lab 08/18/23 1911 08/19/23 0428  AST 30 33  ALT 23 25  ALKPHOS 29* 28*  BILITOT 0.5 0.6  PROT 7.0 7.4  ALBUMIN 3.4* 3.5    CBG: Recent Labs  Lab 08/20/23 2118 08/20/23 2213 08/21/23 0222 08/21/23 0709 08/21/23 1111  GLUCAP 411* 407* 181* 274* 380*    Radiology Studies: DG HIP UNILAT WITH PELVIS 2-3 VIEWS RIGHT Result Date: 08/20/2023 CLINICAL DATA:  Status post right hip surgery. EXAM: DG HIP (WITH OR WITHOUT PELVIS) 2-3V RIGHT COMPARISON:  Fluoroscopic images of same day. FINDINGS: Right hip prosthesis is noted in grossly good position. Expected postoperative changes seen in the surrounding soft tissues. IMPRESSION: Status post right hip arthroplasty. Electronically Signed   By: Rosalene Colon M.D.   On: 08/20/2023 15:54   DG HIP UNILAT WITH PELVIS 2-3 VIEWS RIGHT Result Date: 08/20/2023 CLINICAL DATA:  Hemiarthroplasty of right hip. EXAM: DG HIP (WITH OR WITHOUT PELVIS) 2-3V RIGHT COMPARISON:  None Available. FINDINGS: Six fluoroscopic spot views of the pelvis and right hip obtained in the operating room. Sequential images during hip arthroplasty. Fluoroscopy time 19  seconds. Dose 2.506 mGy. IMPRESSION: Intraoperative fluoroscopy during right hip arthroplasty. Electronically Signed   By: Chadwick Colonel M.D.   On: 08/20/2023 15:05   DG C-Arm 1-60 Min-No Report Result Date: 08/20/2023 Fluoroscopy was utilized by the requesting physician.  No radiographic interpretation.   DG C-Arm 1-60 Min-No Report Result Date: 08/20/2023 Fluoroscopy was utilized by the requesting physician.  No radiographic interpretation.     LOS: 3 days   [employer]  [facility] Pager on [email or web address]  08/21/2023, 4:10 PM

## 2023-08-21 NOTE — Care Management Important Message (Signed)
 Important Message  Patient Details  Name: Veronica Moses MRN: [id number] Date of Birth: 04/29/1955   Important Message Given:  Yes - Medicare IM     Scottie Metayer L Elyan Vanwieren 08/21/2023, 2:15 PM

## 2023-08-21 NOTE — Plan of Care (Signed)
  Problem: Acute Rehab OT Goals (only OT should resolve) Goal: Pt. Will Perform Grooming Flowsheets (Taken 08/21/2023 1044) Pt Will Perform Grooming: with modified independence Goal: Pt. Will Perform Lower Body Bathing Flowsheets (Taken 08/21/2023 1044) Pt Will Perform Lower Body Bathing:  with modified independence  sitting/lateral leans  with adaptive equipment Goal: Pt. Will Perform Lower Body Dressing Flowsheets (Taken 08/21/2023 1044) Pt Will Perform Lower Body Dressing:  with modified independence  sitting/lateral leans  with adaptive equipment Goal: Pt. Will Transfer To Toilet Flowsheets (Taken 08/21/2023 1044) Pt Will Transfer to Toilet:  with modified independence  ambulating Goal: Pt. Will Perform Toileting-Clothing Manipulation Flowsheets (Taken 08/21/2023 1044) Pt Will Perform Toileting - Clothing Manipulation and hygiene:  with modified independence  with adaptive equipment

## 2023-08-21 NOTE — Plan of Care (Signed)
  Problem: Health Behavior/Discharge Planning: Goal: Ability to manage health-related needs will improve Outcome: Progressing   Problem: Clinical Measurements: Goal: Ability to maintain clinical measurements within normal limits will improve Outcome: Progressing Goal: Will remain free from infection Outcome: Progressing Goal: Diagnostic test results will improve Outcome: Progressing Goal: Respiratory complications will improve Outcome: Progressing Goal: Cardiovascular complication will be avoided Outcome: Progressing   Problem: Nutrition: Goal: Adequate nutrition will be maintained Outcome: Progressing   Problem: Elimination: Goal: Will not experience complications related to urinary retention Outcome: Progressing   Problem: Pain Managment: Goal: General experience of comfort will improve and/or be controlled Outcome: Progressing   Problem: Safety: Goal: Ability to remain free from injury will improve Outcome: Progressing   Problem: Skin Integrity: Goal: Risk for impaired skin integrity will decrease Outcome: Progressing   Problem: Coping: Goal: Ability to adjust to condition or change in health will improve Outcome: Progressing   Problem: Fluid Volume: Goal: Ability to maintain a balanced intake and output will improve Outcome: Progressing   Problem: Health Behavior/Discharge Planning: Goal: Ability to identify and utilize available resources and services will improve Outcome: Progressing Goal: Ability to manage health-related needs will improve Outcome: Progressing

## 2023-08-21 NOTE — Plan of Care (Signed)
  Problem: Acute Rehab PT Goals(only PT should resolve) Goal: Pt Will Go Supine/Side To Sit Outcome: Progressing Flowsheets (Taken 08/21/2023 1237) Pt will go Supine/Side to Sit:  with contact guard assist  with minimal assist Goal: Patient Will Transfer Sit To/From Stand Outcome: Progressing Flowsheets (Taken 08/21/2023 1237) Patient will transfer sit to/from stand:  with contact guard assist  with minimal assist Goal: Pt Will Transfer Bed To Chair/Chair To Bed Outcome: Progressing Flowsheets (Taken 08/21/2023 1237) Pt will Transfer Bed to Chair/Chair to Bed:  with contact guard assist  with min assist Goal: Pt Will Ambulate Outcome: Progressing Flowsheets (Taken 08/21/2023 1237) Pt will Ambulate:  75 feet  with minimal assist  with contact guard assist  with rolling walker   12:38 PM, 08/21/23 Walton Guppy, MPT Physical Therapist with [facility] [phone] office [phone] mobile phone

## 2023-08-21 NOTE — Evaluation (Signed)
 Physical Therapy Evaluation Patient Details Name: Veronica Moses MRN: [id number] DOB: 1955/08/16 Today's Date: 08/21/2023  History of Present Illness  Veronica Moses is a 68 y.o. female s/p Right Hip Hemiarthroplasty for a displaced femoral neck fracture on 08/20/23 with medical history significant of COPD, diabetes mellitus, seizure disorder, tobacco abuse who presents to the emergency department due to fall sustained at home last night.  She states that she lost balance after being bumped into by her dog.  She thought it was just a bruise since she was able to get up, but on waking up this morning, she had increased pain and had difficulty in being able to walk, so EMS was activated.  On arrival of EMS then, 200 mcg of fentanyl  was given and she was taken to the ED for further evaluation.  She was recently admitted from 5/19 to 5/20 due to severe electrolyte abnormalities with hypokalemia and hypomagnesemia along with hypoglycemia.  Hypertensive emergency demonstrated with repeated doses of hydralazine .   Clinical Impression  Patient demonstrates slow labored movement for sitting up at bedside requiring Min/mod assist for moving RLE due to increase pain, required verbal/tactile cueing for safety during sit to stand, transferring to chair with fair/good carryover and tolerated walking in room/hallway with slow labored movement, limited weight bearing on RLE, on room air with SpO2 at 93%, but dropped to 87% after sitting in chair and put back on 2 LPM to keep above 90%. Patient tolerated sitting up in chair after therapy with spouse present. Patient will benefit from continued skilled physical therapy in hospital and recommended venue below to increase strength, balance, endurance for safe ADLs and gait.           If plan is discharge home, recommend the following: A little help with walking and/or transfers;A little help with bathing/dressing/bathroom;Help with stairs or ramp for entrance;Assistance with  cooking/housework   Can travel by private vehicle   Yes    Equipment Recommendations Rolling walker (2 wheels);BSC/3in1  Recommendations for Other Services       Functional Status Assessment Patient has had a recent decline in their functional status and demonstrates the ability to make significant improvements in function in a reasonable and predictable amount of time.     Precautions / Restrictions Precautions Precautions: Fall Recall of Precautions/Restrictions: Intact Precaution/Restrictions Comments: anterior hip Restrictions Weight Bearing Restrictions Per Provider Order: Yes RLE Weight Bearing Per Provider Order: Weight bearing as tolerated      Mobility  Bed Mobility Overal bed mobility: Needs Assistance Bed Mobility: Supine to Sit     Supine to sit: Min assist, Mod assist     General bed mobility comments: had diffiuclty moving RLE due to increased hip pain    Transfers Overall transfer level: Needs assistance Equipment used: Rolling walker (2 wheels) Transfers: Sit to/from Stand, Bed to chair/wheelchair/BSC Sit to Stand: Min assist   Step pivot transfers: Min assist       General transfer comment: initially had difficulty weight bearing on RLE, bu demonstrated improvement after resting in chair    Ambulation/Gait Ambulation/Gait assistance: Min assist, Mod assist Gait Distance (Feet): 45 Feet Assistive device: Rolling walker (2 wheels) Gait Pattern/deviations: Decreased step length - right, Decreased step length - left, Decreased stride length, Decreased stance time - right, Antalgic Gait velocity: decreased     General Gait Details: slow labored movement with fair tolerance for weightbreaing on RLE without loss of balance, limited mostly due to fatigue  Stairs  Wheelchair Mobility     Tilt Bed    Modified Rankin (Stroke Patients Only)       Balance Overall balance assessment: Needs assistance Sitting-balance support:  Feet supported, No upper extremity supported Sitting balance-Leahy Scale: Good Sitting balance - Comments: seated at EOB   Standing balance support: Bilateral upper extremity supported, During functional activity, Reliant on assistive device for balance Standing balance-Leahy Scale: Fair Standing balance comment: using RW                             Pertinent Vitals/Pain Pain Assessment Pain Assessment: Faces Faces Pain Scale: Hurts a little bit Pain Location: right hip during bed mobility Pain Descriptors / Indicators: Discomfort, Grimacing, Guarding Pain Intervention(s): Limited activity within patient's tolerance, Monitored during session, Premedicated before session, Repositioned    Home Living Family/patient expects to be discharged to:: Private residence Living Arrangements: Spouse/significant other Available Help at Discharge: Family Type of Home: House Home Access: Stairs to enter Entrance Stairs-Rails: Lawyer of Steps: 5   Home Layout: One level Home Equipment: Agricultural consultant (2 wheels);Cane - single point      Prior Function Prior Level of Function : Independent/Modified Independent             Mobility Comments: Community ambulator without AD; does not drive ADLs Comments: Independent     Extremity/Trunk Assessment   Upper Extremity Assessment Upper Extremity Assessment: Defer to OT evaluation    Lower Extremity Assessment Lower Extremity Assessment: Generalized weakness;RLE deficits/detail RLE Deficits / Details: grossly -4/5 RLE: Unable to fully assess due to pain RLE Sensation: WNL RLE Coordination: WNL    Cervical / Trunk Assessment Cervical / Trunk Assessment: Normal  Communication   Communication Communication: No apparent difficulties    Cognition Arousal: Alert Behavior During Therapy: WFL for tasks assessed/performed   PT - Cognitive impairments: No apparent impairments                          Following commands: Intact       Cueing Cueing Techniques: Verbal cues     General Comments      Exercises     Assessment/Plan    PT Assessment Patient needs continued PT services  PT Problem List Decreased strength;Decreased activity tolerance;Decreased balance;Decreased mobility       PT Treatment Interventions DME instruction;Gait training;Stair training;Functional mobility training;Therapeutic activities;Therapeutic exercise;Balance training;Patient/family education    PT Goals (Current goals can be found in the Care Plan section)  Acute Rehab PT Goals Patient Stated Goal: return home with family PT Goal Formulation: With patient/family Time For Goal Achievement: 09/04/23 Potential to Achieve Goals: Good    Frequency Min 3X/week     Co-evaluation PT/OT/SLP Co-Evaluation/Treatment: Yes Reason for Co-Treatment: To address functional/ADL transfers PT goals addressed during session: Mobility/safety with mobility;Balance;Proper use of DME OT goals addressed during session: ADL's and self-care       AM-PAC PT "6 Clicks" Mobility  Outcome Measure Help needed turning from your back to your side while in a flat bed without using bedrails?: A Lot Help needed moving from lying on your back to sitting on the side of a flat bed without using bedrails?: A Lot Help needed moving to and from a bed to a chair (including a wheelchair)?: A Little Help needed standing up from a chair using your arms (e.g., wheelchair or bedside chair)?: A Little Help needed to walk in  hospital room?: A Little Help needed climbing 3-5 steps with a railing? : A Lot 6 Click Score: 15    End of Session Equipment Utilized During Treatment: Oxygen  Activity Tolerance: Patient tolerated treatment well;Patient limited by fatigue Patient left: in chair;with call bell/phone within reach;with family/visitor present Nurse Communication: Mobility status PT Visit Diagnosis: Unsteadiness on feet  (R26.81);Other abnormalities of gait and mobility (R26.89);Muscle weakness (generalized) (M62.81);Pain Pain - Right/Left: Right Pain - part of body: Hip    Time: 0852-0920 PT Time Calculation (min) (ACUTE ONLY): 28 min   Charges:   PT Evaluation $PT Eval Moderate Complexity: 1 Mod PT Treatments $Therapeutic Activity: 23-37 mins PT General Charges $$ ACUTE PT VISIT: 1 Visit         12:34 PM, 08/21/23 Walton Guppy, MPT Physical Therapist with [facility] [phone] office [phone] mobile phone

## 2023-08-21 NOTE — Plan of Care (Signed)

## 2023-08-21 NOTE — Inpatient Diabetes Management (Signed)
 Inpatient Diabetes Program Recommendations  AACE/ADA: New Consensus Statement on Inpatient Glycemic Control (2015)  Target Ranges:  Prepandial:   less than 140 mg/dL      Peak postprandial:   less than 180 mg/dL (1-2 hours)      Critically ill patients:  140 - 180 mg/dL   Lab Results  Component Value Date   GLUCAP 380 (H) 08/21/2023   HGBA1C 8.9 (H) 08/03/2023    Latest Reference Range & Units 08/20/23 21:18 08/20/23 22:13 08/21/23 02:22 08/21/23 07:09 08/21/23 11:11  Glucose-Capillary 70 - 99 mg/dL 409 (H) 811 (H) 914 (H) 274 (H) 380 (H)  (H): Data is abnormally high  Review of Glycemic Control  Diabetes history: DM2 Outpatient Diabetes medications: Lantus  20 units daily, Novolog  SSI, glipizide 5 mg daily, Ozempic 0.5 mg weekly Current orders for Inpatient glycemic control: Semglee  12 units BID, Novolog  0-15 units correction scale TID, Novolog  0-5 units HS scale  Inpatient Diabetes Program Recommendations:   Noted that patient's blood sugars have been greater than 200 mg/dl. Patient is on Solumedrol.  Recommend increasing Semglee  to 18 units BID, continuing Novolog  0-15 units correction scale TID, Novolog  0-5 units HS scale, and adding Novolog  4 units TID with meals if eating at least 50% of meal.   Nick Barman RN BSN CDE Diabetes Coordinator Pager: [phone]  8am-5pm

## 2023-08-21 NOTE — Evaluation (Addendum)
 Occupational Therapy Evaluation Patient Details Name: Veronica Moses MRN: [id number] DOB: 07-03-1955 Today's Date: 08/21/2023   History of Present Illness   Veronica Moses is a 68 y.o. female with medical history significant of COPD, diabetes mellitus, seizure disorder, tobacco abuse who presents to the emergency department due to fall sustained at home last night.  She states that she lost balance after being bumped into by her dog.  She thought it was just a bruise since she was able to get up, but on waking up this morning, she had increased pain and had difficulty in being able to walk, so EMS was activated.  On arrival of EMS then, 200 mcg of fentanyl  was given and she was taken to the ED for further evaluation.  She was recently admitted from 5/19 to 5/20 due to severe electrolyte abnormalities with hypokalemia and hypomagnesemia along with hypoglycemia.  Hypertensive emergency demonstrated with repeated doses of hydralazine . (per DO)     Clinical Impressions Pt agreeable to OT and PT co-evaluation. Pt was independent prior to recent fall. Pt reports husband can be home 24/7. Pt required min to mod A for bed mobility and min A for transfer to chair and ambulation with RW. Able to partially complete lower body dressing but limited due to precautions for R LE. Pt noted to desaturate to ~87% SpO2 even when seated without supplemental O2. Pt placed back on nasal cannula at 2 LPM and was able to saturate 90% or higher. Good B UE functional use. Pt left in the chair with call bell within reach and family present. Pt will benefit from continued OT in the hospital and recommended venue below to increase strength, balance, and endurance for safe ADL's.        If plan is discharge home, recommend the following:   A little help with walking and/or transfers;A lot of help with bathing/dressing/bathroom;Assistance with cooking/housework;Help with stairs or ramp for entrance;Assist for transportation      Functional Status Assessment   Patient has had a recent decline in their functional status and demonstrates the ability to make significant improvements in function in a reasonable and predictable amount of time.     Equipment Recommendations   BSC/3in1             Precautions/Restrictions   Precautions Precautions: Fall Recall of Precautions/Restrictions: Intact Precaution/Restrictions Comments: anterior hip Restrictions Weight Bearing Restrictions Per Provider Order: Yes RLE Weight Bearing Per Provider Order: Weight bearing as tolerated     Mobility Bed Mobility Overal bed mobility: Needs Assistance Bed Mobility: Supine to Sit     Supine to sit: Min assist, Mod assist     General bed mobility comments: labored movement    Transfers Overall transfer level: Needs assistance Equipment used: Rolling walker (2 wheels) Transfers: Sit to/from Stand, Bed to chair/wheelchair/BSC Sit to Stand: Min assist     Step pivot transfers: Min assist     General transfer comment: labored movement; verbal cuing for proper RW use.      Balance Overall balance assessment: Needs assistance Sitting-balance support: No upper extremity supported, Feet supported Sitting balance-Leahy Scale: Good Sitting balance - Comments: seated at EOB   Standing balance support: Bilateral upper extremity supported, During functional activity, Reliant on assistive device for balance Standing balance-Leahy Scale: Fair Standing balance comment: using RW                           ADL either performed or assessed  with clinical judgement   ADL Overall ADL's : Needs assistance/impaired         Upper Body Bathing: Set up;Sitting   Lower Body Bathing: Moderate assistance;Maximal assistance;Sitting/lateral leans   Upper Body Dressing : Set up;Sitting   Lower Body Dressing: Moderate assistance;Maximal assistance;Sitting/lateral leans Lower Body Dressing Details (indicate cue  type and reason): Demosntrates ability to don and doff L LE. Toilet Transfer: Minimal assistance;Ambulation;Rolling walker (2 wheels) Toilet Transfer Details (indicate cue type and reason): Simulated via ambulation in the room. Toileting- Clothing Manipulation and Hygiene: Minimal assistance;Sitting/lateral lean       Functional mobility during ADLs: Minimal assistance;Rolling walker (2 wheels) General ADL Comments: Able to ambulate just outside the room with the RW     Vision Baseline Vision/History: 1 Wears glasses Ability to See in Adequate Light: 1 Impaired Patient Visual Report: No change from baseline Vision Assessment?: Wears glasses for reading     Perception Perception: Not tested       Praxis Praxis: Not tested       Pertinent Vitals/Pain Pain Assessment Pain Assessment: No/denies pain     Extremity/Trunk Assessment Upper Extremity Assessment Upper Extremity Assessment: Overall WFL for tasks assessed   Lower Extremity Assessment Lower Extremity Assessment: Defer to PT evaluation   Cervical / Trunk Assessment Cervical / Trunk Assessment: Normal   Communication Communication Communication: No apparent difficulties   Cognition Arousal: Alert Behavior During Therapy: WFL for tasks assessed/performed Cognition: No apparent impairments                               Following commands: Intact       Cueing  General Comments   Cueing Techniques: Verbal cues                 Home Living Family/patient expects to be discharged to:: Private residence Living Arrangements: Spouse/significant other Available Help at Discharge: Family Type of Home: House Home Access: Stairs to enter Secretary/administrator of Steps: 5 Entrance Stairs-Rails: Left;Right Home Layout: One level     Bathroom Shower/Tub: Producer, television/film/video: Handicapped height Bathroom Accessibility: Yes How Accessible: Accessible via walker             Prior Functioning/Environment Prior Level of Function : Independent/Modified Independent             Mobility Comments: Community ambulator without AD; does not drive ADLs Comments: Independent    OT Problem List: Decreased strength;Decreased range of motion;Decreased activity tolerance;Impaired balance (sitting and/or standing);Decreased knowledge of use of DME or AE;Decreased knowledge of precautions   OT Treatment/Interventions: Self-care/ADL training;Therapeutic exercise;DME and/or AE instruction;Therapeutic activities;Patient/family education;Balance training      OT Goals(Current goals can be found in the care plan section)   Acute Rehab OT Goals Patient Stated Goal: return home OT Goal Formulation: With patient Time For Goal Achievement: 09/04/23 Potential to Achieve Goals: Good   OT Frequency:  Min 2X/week    Co-evaluation PT/OT/SLP Co-Evaluation/Treatment: Yes Reason for Co-Treatment: To address functional/ADL transfers   OT goals addressed during session: ADL's and self-care                       End of Session Equipment Utilized During Treatment: Rolling walker (2 wheels);Oxygen   Activity Tolerance: Patient tolerated treatment well Patient left: in chair;with call bell/phone within reach;with family/visitor present  OT Visit Diagnosis: Unsteadiness on feet (R26.81);Other abnormalities of gait and mobility (  R26.89);Muscle weakness (generalized) (M62.81);History of falling (Z91.81)                Time: 0981-1914 OT Time Calculation (min): 29 min Charges:  OT General Charges $OT Visit: 1 Visit OT Evaluation $OT Eval Low Complexity: 1 Low  Laquinta Hazell OT, MOT   Thurnell Floss 08/21/2023, 10:41 AM

## 2023-08-22 DIAGNOSIS — S72001D Fracture of unspecified part of neck of right femur, subsequent encounter for closed fracture with routine healing: Secondary | ICD-10-CM | POA: Diagnosis not present

## 2023-08-22 LAB — CBC WITH DIFFERENTIAL/PLATELET
Abs Immature Granulocytes: 0.19 10*3/uL — ABNORMAL HIGH (ref 0.00–0.07)
Basophils Absolute: 0 10*3/uL (ref 0.0–0.1)
Basophils Relative: 0 %
Eosinophils Absolute: 0 10*3/uL (ref 0.0–0.5)
Eosinophils Relative: 0 %
HCT: 33 % — ABNORMAL LOW (ref 36.0–46.0)
Hemoglobin: 10.5 g/dL — ABNORMAL LOW (ref 12.0–15.0)
Immature Granulocytes: 2 %
Lymphocytes Relative: 10 %
Lymphs Abs: 1.1 10*3/uL (ref 0.7–4.0)
MCH: 29.8 pg (ref 26.0–34.0)
MCHC: 31.8 g/dL (ref 30.0–36.0)
MCV: 93.8 fL (ref 80.0–100.0)
Monocytes Absolute: 0.6 10*3/uL (ref 0.1–1.0)
Monocytes Relative: 5 %
Neutro Abs: 9.3 10*3/uL — ABNORMAL HIGH (ref 1.7–7.7)
Neutrophils Relative %: 83 %
Platelets: 323 10*3/uL (ref 150–400)
RBC: 3.52 MIL/uL — ABNORMAL LOW (ref 3.87–5.11)
RDW: 16.6 % — ABNORMAL HIGH (ref 11.5–15.5)
WBC: 11.2 10*3/uL — ABNORMAL HIGH (ref 4.0–10.5)
nRBC: 0 % (ref 0.0–0.2)

## 2023-08-22 LAB — COMPREHENSIVE METABOLIC PANEL WITH GFR
ALT: 17 U/L (ref 0–44)
AST: 27 U/L (ref 15–41)
Albumin: 2.7 g/dL — ABNORMAL LOW (ref 3.5–5.0)
Alkaline Phosphatase: 28 U/L — ABNORMAL LOW (ref 38–126)
Anion gap: 9 (ref 5–15)
BUN: 18 mg/dL (ref 8–23)
CO2: 25 mmol/L (ref 22–32)
Calcium: 8.1 mg/dL — ABNORMAL LOW (ref 8.9–10.3)
Chloride: 105 mmol/L (ref 98–111)
Creatinine, Ser: 0.71 mg/dL (ref 0.44–1.00)
GFR, Estimated: >60 mL/min (ref >60)
Glucose, Bld: 363 mg/dL — ABNORMAL HIGH (ref 70–99)
Potassium: 4.7 mmol/L (ref 3.5–5.1)
Sodium: 139 mmol/L (ref 135–145)
Total Bilirubin: 0.7 mg/dL (ref 0.0–1.2)
Total Protein: 5.8 g/dL — ABNORMAL LOW (ref 6.5–8.1)

## 2023-08-22 LAB — IRON AND TIBC
Iron: 17 ug/dL — ABNORMAL LOW (ref 28–170)
Saturation Ratios: 7 % — ABNORMAL LOW (ref 10.4–31.8)
TIBC: 260 ug/dL (ref 250–450)
UIBC: 243 ug/dL

## 2023-08-22 LAB — PHOSPHORUS: Phosphorus: 2.5 mg/dL (ref 2.5–4.6)

## 2023-08-22 LAB — GLUCOSE, CAPILLARY
Glucose-Capillary: 366 mg/dL — ABNORMAL HIGH (ref 70–99)
Glucose-Capillary: 323 mg/dL — ABNORMAL HIGH (ref 70–99)
Glucose-Capillary: 254 mg/dL — ABNORMAL HIGH (ref 70–99)

## 2023-08-22 LAB — FERRITIN: Ferritin: 30 ng/mL (ref 11–307)

## 2023-08-22 MED ORDER — IRON SUCROSE 200 MG IVPB - SIMPLE MED
200.0000 mg | Freq: Once | Status: AC
  Administered 2023-08-22: 200 mg via INTRAVENOUS
  Filled 2023-08-22: qty 10

## 2023-08-22 MED ORDER — POLYETHYLENE GLYCOL 3350 17 G PO PACK: 17.0000 g | PACK | Freq: Every day | ORAL | Status: DC

## 2023-08-22 MED ORDER — INSULIN GLARGINE 100 UNIT/ML SOLOSTAR PEN: 20.0000 [IU] | PEN_INJECTOR | Freq: Every day | SUBCUTANEOUS | Status: AC

## 2023-08-22 MED ORDER — FERROUS SULFATE 325 (65 FE) MG PO TABS: 325.0000 mg | ORAL_TABLET | Freq: Every day | ORAL | Status: DC

## 2023-08-22 MED ORDER — ASPIRIN 81 MG PO CHEW: CHEWABLE_TABLET | ORAL | 0 refills | Status: AC

## 2023-08-22 MED ORDER — OXYCODONE HCL 5 MG PO TABS: 5.0000 mg | ORAL_TABLET | Freq: Four times a day (QID) | ORAL | 0 refills | Status: DC | PRN

## 2023-08-22 MED ORDER — NICOTINE 14 MG/24HR TD PT24: 14.0000 mg | MEDICATED_PATCH | Freq: Every day | TRANSDERMAL | 5 refills | Status: DC

## 2023-08-22 MED ORDER — SENNA 8.6 MG PO TABS: 1.0000 | ORAL_TABLET | Freq: Every day | ORAL | 0 refills | Status: DC

## 2023-08-22 MED ORDER — NICOTINE POLACRILEX 4 MG MT LOZG: 4.0000 mg | LOZENGE | OROMUCOSAL | 0 refills | Status: DC | PRN

## 2023-08-22 MED ORDER — VARENICLINE TARTRATE 1 MG PO TABS: ORAL_TABLET | ORAL | 0 refills | Status: AC

## 2023-08-22 MED ORDER — METHYLPREDNISOLONE SODIUM SUCC 40 MG IJ SOLR: 40.0000 mg | Freq: Every day | INTRAMUSCULAR | Status: DC

## 2023-08-22 NOTE — TOC Progression Note (Signed)
 DME note   Patient Details  Name: Veronica Moses MRN: [id number] Date of Birth: 06/15/1955  Transition [facility]) CM/SW Contact  Orelia Binet, RN Phone Number: 08/22/2023, 11:30 AM   Patient is requiring a bed side commode in the home; The beneficiary is confined to one level of the home environment and there is no toilet on that level.

## 2023-08-22 NOTE — Progress Notes (Incomplete)
 [facility] PROGRESS NOTE   Veronica Moses ZOX:[id number] DOB: 18-Aug-1955 DOA: 08/18/2023  PCP: Wendi Ham, NP  Brief History: 68 y.o. female with medical history significant of COPD, diabetes mellitus, seizure disorder, tobacco abuse who presented to the emergency department due to fall sustained at home last night.  She states that she lost balance after being bumped into by her dog.  She had persistent pain in the right hip area and was finding it difficult to walk.  She decided to come into the emergency department via ambulance.  Found to have a right hip fracture.  She was hospitalized for further management.    Consultants: Orthopedics  Procedures: None   Subjective/Interval History: Some R hip discomfort, relieved with oral medications. Eager to get home. Had a bowel movement this morning.    Assessment/Plan:  Right hip fracture -s/p R hip hemiarthroplasty with orthopedics -Pain control, PT/OT   COPD with concern for acute exacerbation - Wheezing on admission exam  - Continue bronchodilator management and the use of his steroids. - Wean off oxygen  supplementation as tolerated. -IS/flutter  Hypertensive urgency POA  -Most likely associated with pain - Blood pressure better controlled - Continue treatment with hydralazine  and losartan   History of seizures -No seizures appreciated - Continue Keppra .  Leukocytosis  Likely reactive in setting of recent or. Possibly related to steroids as well.    Diabetes mellitus type 2 with hyperglycemia/honk -Most likely triggered by the use of his steroids for COPD and also stress in setting of an acute fracture. -HbA1c was 8.9 07/2023 Remains poorly controlled.  CBG (last 3)  Recent Labs    08/21/23 1633 08/21/23 2025 08/22/23 0712  GLUCAP 252* 290* 366*   Will semglee  BID dosing  Add meal time coverage  Continue SSI  Hypocalcemia Normal albumin. Magnesium  WNL. Vit D pending.  -Recheck calcium  -F/u  phos -F/u Vitamin D   Normocytic anemia  Worsening in the setting of recent OR.     Latest Ref Rng & Units 08/22/2023    6:21 AM 08/21/2023    4:06 AM 08/20/2023    4:36 AM  CBC  WBC 4.0 - 10.5 K/uL 11.2  14.5  14.6   Hemoglobin 12.0 - 15.0 g/dL 81.1  91.4  78.2   Hematocrit 36.0 - 46.0 % 33.0  32.4  36.8   Platelets 150 - 400 K/uL 323  347  320    -F/u iron panel   Hypokalemia/hypomagnesemia (Resolved)  Hyperlipidemia - Continue statin.  History of dementia/depression - Continue supportive care - Continue Lexapro  and Aricept  - Constant reorientation will be continued - Patient at high risk for hospital-acquired delirium.  DVT Prophylaxis: Definitive prophylaxis after surgery. Code Status: Full code Family Communication: Discussed with patient's daughter and husband at bedside. Disposition Plan: To be determined  Status is: Inpatient Remains inpatient appropriate because: Right hip fracture   Medications: Scheduled:  acetaminophen   650 mg Oral Q6H   Or   acetaminophen   650 mg Rectal Q6H   ALPRAZolam   1 mg Oral QHS   budesonide  (PULMICORT ) nebulizer solution  0.25 mg Nebulization BID   Chlorhexidine  Gluconate Cloth  6 each Topical Daily   dextromethorphan -guaiFENesin   1 tablet Oral BID   donepezil   10 mg Oral QHS   escitalopram   20 mg Oral Daily   feeding supplement (GLUCERNA SHAKE)  237 mL Oral TID BM   [START ON 08/23/2023] ferrous sulfate  325 mg Oral Q breakfast   insulin  aspart  0-15 Units Subcutaneous TID WC  insulin  aspart  0-5 Units Subcutaneous QHS   insulin  aspart  3 Units Subcutaneous TID WC   insulin  glargine-yfgn  12 Units Subcutaneous BID   levalbuterol   0.63 mg Nebulization TID   levETIRAcetam   1,000 mg Oral BID   losartan   50 mg Oral Daily   [START ON 08/23/2023] methylPREDNISolone  (SOLU-MEDROL ) injection  40 mg Intravenous Q24H   nicotine   14 mg Transdermal Daily   pantoprazole   40 mg Oral Daily   rosuvastatin   20 mg Oral Daily   senna  1 tablet Oral  Daily   Continuous:  iron sucrose      PRN:dextrose , hydrALAZINE , morphine  injection, mouth rinse, oxyCODONE  **OR** oxyCODONE , prochlorperazine   Antibiotics: Anti-infectives (From admission, onward)    Start     Dose/Rate Route Frequency Ordered Stop   08/20/23 2000  ceFAZolin  (ANCEF ) IVPB 2g/100 mL premix        2 g 200 mL/hr over 30 Minutes Intravenous Every 8 hours 08/20/23 1618 08/21/23 1447   08/20/23 1435  vancomycin  (VANCOCIN ) powder  Status:  Discontinued          As needed 08/20/23 1500 08/20/23 1618   08/20/23 1400  ceFAZolin  (ANCEF ) IVPB 2g/100 mL premix        2 g 200 mL/hr over 30 Minutes Intravenous On call to O.R. 08/19/23 0938 08/20/23 1320   08/20/23 1216  ceFAZolin  (ANCEF ) 2-4 GM/100ML-% IVPB       Note to Pharmacy: Lind Repine S: cabinet override      08/20/23 1216 08/21/23 0029       Objective:  Vital Signs  Vitals:   08/21/23 1914 08/21/23 1942 08/22/23 0415 08/22/23 0847  BP:  (!) 115/56 (!) 154/70   Pulse:  (!) 109 73   Resp:  20 18   Temp:  99 F (37.2 C) 98.8 F (37.1 C)   TempSrc:  Oral    SpO2: 91% 94% 93% 96%  Weight:      Height:        Intake/Output Summary (Last 24 hours) at 08/22/2023 1109 Last data filed at 08/22/2023 1030 Gross per 24 hour  Intake 240 ml  Output 2025 ml  Net -1785 ml   Filed Weights   08/18/23 1816 08/18/23 2322  Weight: 58 kg 58.6 kg    Physical Exam  Constitutional: In no distress.  Cardiovascular: Normal rate, regular rhythm. No lower extremity edema  Pulmonary: Non labored breathing on [place], no wheezing or rales.   Abdominal: Soft. Normal bowel sounds. Non distended and non tender Musculoskeletal: RLE with bandage over incision. No surrounding erythema, induration, or purulent drainage.  Neurological: Alert and oriented to person, place, and time. Non focal  Skin: Skin is warm and dry.   Lab Results:  Data Reviewed: I have personally reviewed following labs and reports of the imaging  studies  CBC: Recent Labs  Lab 08/18/23 1911 08/19/23 0428 08/20/23 0436 08/21/23 0406 08/22/23 0621  WBC 10.2 9.6 14.6* 14.5* 11.2*  NEUTROABS 7.5  --   --   --  9.3*  HGB 12.2 12.7 11.7* 10.0* 10.5*  HCT 37.1 40.3 36.8 32.4* 33.0*  MCV 90.0 90.8 90.6 92.8 93.8  PLT 321 282 320 347 323    Basic Metabolic Panel: Recent Labs  Lab 08/19/23 0428 08/19/23 2158 08/20/23 0436 08/21/23 0406 08/21/23 1037 08/21/23 1737 08/22/23 0621  NA 134* 128* 140 140  --  134* 139  K 3.7 4.1 4.2 3.9  --  3.7 4.7  CL 100 94*  104 106  --  102 105  CO2 26 23 28 25   --  23 25  GLUCOSE 332* 542* 176* 137*  --  262* 363*  BUN 9 16 15 16   --  17 18  CREATININE 0.84 0.88 0.74 0.66  --  0.68 0.71  CALCIUM  8.5* 8.3* 8.7* 7.6*  --  8.1* 8.1*  MG 1.6* 2.3  --   --  2.0  --   --   PHOS 2.5  --   --   --   --   --  2.5    GFR: Estimated Creatinine Clearance: 55.7 mL/min (by C-G formula based on SCr of 0.71 mg/dL).  Liver Function Tests: Recent Labs  Lab 08/18/23 1911 08/19/23 0428 08/22/23 0621  AST 30 33 27  ALT 23 25 17   ALKPHOS 29* 28* 28*  BILITOT 0.5 0.6 0.7  PROT 7.0 7.4 5.8*  ALBUMIN 3.4* 3.5 2.7*    CBG: Recent Labs  Lab 08/21/23 0709 08/21/23 1111 08/21/23 1633 08/21/23 2025 08/22/23 0712  GLUCAP 274* 380* 252* 290* 366*    Radiology Studies: DG HIP UNILAT WITH PELVIS 2-3 VIEWS RIGHT Result Date: 08/20/2023 CLINICAL DATA:  Status post right hip surgery. EXAM: DG HIP (WITH OR WITHOUT PELVIS) 2-3V RIGHT COMPARISON:  Fluoroscopic images of same day. FINDINGS: Right hip prosthesis is noted in grossly good position. Expected postoperative changes seen in the surrounding soft tissues. IMPRESSION: Status post right hip arthroplasty. Electronically Signed   By: Rosalene Colon M.D.   On: 08/20/2023 15:54   DG HIP UNILAT WITH PELVIS 2-3 VIEWS RIGHT Result Date: 08/20/2023 CLINICAL DATA:  Hemiarthroplasty of right hip. EXAM: DG HIP (WITH OR WITHOUT PELVIS) 2-3V RIGHT COMPARISON:   None Available. FINDINGS: Six fluoroscopic spot views of the pelvis and right hip obtained in the operating room. Sequential images during hip arthroplasty. Fluoroscopy time 19 seconds. Dose 2.506 mGy. IMPRESSION: Intraoperative fluoroscopy during right hip arthroplasty. Electronically Signed   By: Chadwick Colonel M.D.   On: 08/20/2023 15:05   DG C-Arm 1-60 Min-No Report Result Date: 08/20/2023 Fluoroscopy was utilized by the requesting physician.  No radiographic interpretation.   DG C-Arm 1-60 Min-No Report Result Date: 08/20/2023 Fluoroscopy was utilized by the requesting physician.  No radiographic interpretation.     LOS: 4 days   [employer]  [facility] Pager on [email or web address]  08/22/2023, 11:09 AM

## 2023-08-22 NOTE — TOC Transition Note (Signed)
 Transition of Care [facility]) - Discharge Note   Patient Details  Name: Veronica Moses MRN: [id number] Date of Birth: 08-20-1955  [facility] CM/SW Contact:  Orelia Binet, RN Phone Number: 08/22/2023, 1:25 PM   Clinical Narrative:   Patient discharging home, MD ordering 3N1, and RW. Referral sent to [facility], DME will be delivered to the home.  MD also ordering HHPT. Patient is agreeable. CMS options reviewed. Referral sent to [facility] with [facility]. He is accepting.    Final next level of care: Home w Home Health Services Barriers to Discharge: Barriers Resolved  Patient Goals and CMS Choice Patient states their goals for this hospitalization and ongoing recovery are:: return home CMS Medicare.gov Compare Post Acute Care list provided to:: Patient Choice offered to / list presented to : Patient, Adult Children     Discharge Placement        Patient and family notified of of transfer: 08/22/23  Discharge Plan and Services Additional resources added to the After Visit Summary for   In-house Referral: Clinical Social Work Discharge Planning Services: CM Consult Post Acute Care Choice: Home Health               [facility] Agency: [facility] Date [facility] Agency Contacted: 08/22/23 Time HH Agency Contacted: 1325 Representative spoke with at [facility] Agency: Randel Buss  Social Drivers of Health (SDOH) Interventions SDOH Screenings   Food Insecurity: No Food Insecurity (08/19/2023)  Housing: Low Risk  (08/19/2023)  Transportation Needs: No Transportation Needs (08/19/2023)  Utilities: Not At Risk (08/19/2023)  Social Connections: Patient Declined (08/19/2023)  Tobacco Use: High Risk (08/20/2023)     Readmission Risk Interventions    08/22/2023    1:18 PM 08/19/2023    1:05 PM  Readmission Risk Prevention Plan  Transportation Screening Complete Complete  PCP or Specialist Appt within 3-5 Days Not Complete   HRI or Home Care Consult Complete Complete  Social Work Consult for Recovery Care  Planning/Counseling Complete Complete  Palliative Care Screening Not Applicable Not Applicable  Medication Review [employer]) Complete Complete

## 2023-08-22 NOTE — Discharge Instructions (Addendum)
 Please take aspirin  81mg  twice a day.    Please call your orthopedic surgeons office to make sure you have an appointment scheduled.   Continue to do the exercises that you were shown by physical therapy here in the hospital.   Please call [phone] ([phone])    Please take your iron pills, while you are on this medication and pain pills (oxycodone ) please take a stool softener. You should have one soft bowel movement a day.

## 2023-08-24 ENCOUNTER — Telehealth: Payer: Self-pay

## 2023-08-24 NOTE — Telephone Encounter (Signed)
 This encounter was created in error - please disregard.

## 2023-08-24 NOTE — Patient Instructions (Signed)
 Visit Information  Thank you for taking time to visit with me today. Please don't hesitate to contact me if I can be of assistance to you before our next scheduled telephone appointment.  Wound Management Discussed Monitor for swelling, redness, pain, pus, and fever Keep wound clean and dry.   Notify physician immediately for any changes   Falls Prevention Discussed Keeping moving and stay active Keep your bones strong Go for regular eye checkups Always stand up slowly Wear proper non-slip footwear Light up your living space Use assistive devices.   Patient verbalizes understanding of instructions and care plan provided today and agrees to view in MyChart. Active MyChart status and patient understanding of how to access instructions and care plan via MyChart confirmed with patient.     The patient has been provided with contact information for the care management team and has been advised to call with any health related questions or concerns.   Please call the care guide team at [phone] if you need to cancel or reschedule your appointment.   Please call the Suicide and Crisis Lifeline: 988 if you are experiencing a Mental Health or Behavioral Health Crisis or need someone to talk to.  Bosten Newstrom J. Trayquan Kolakowski RN, MSN [facility], [facility] RN Care Manager Direct Dial: [phone]  Fax: [phone] Website: [email or web address]

## 2023-08-24 NOTE — Transitions of Care (Post Inpatient/ED Visit) (Signed)
 08/24/2023  Name: Veronica Moses MRN: [id number] DOB: April 06, 1955  Today's TOC FU Call Status: Today's TOC FU Call Status:: Successful TOC FU Call Completed TOC FU Call Complete Date: 08/24/23 Patient's Name and Date of Birth confirmed.  Transition Care Management Follow-up Telephone Call Date of Discharge: 08/22/23 [facility] Type of Discharge: Inpatient Admission Primary Inpatient Discharge Diagnosis:: Right hip fracture How have you been since you were released from the hospital?: Better Any questions or concerns?: No  Items Reviewed: Did you receive and understand the discharge instructions provided?: Yes Medications obtained,verified, and reconciled?: Yes (Medications Reviewed) Any new allergies since your discharge?: No Dietary orders reviewed?: Yes Type of Diet Ordered:: low sodium heart healthy carb modified Do you have support at home?: Yes People in Home [RPT]: child(ren), adult, spouse Name of Support/Comfort Primary Source: Avel Bock  Medications Reviewed Today: Spouse completed medication reconciliation. He will be picking up new medications today as pharmacy was closed once patient discharged from the hospital and closed on Sunday.  Medications Reviewed Today     Reviewed by Linden Mikes, RN (Case Manager) on 08/24/23 at 1155  Med List Status: <None>   Medication Order Taking? Sig Documenting Provider Last Dose Status Informant  acetaminophen  (TYLENOL ) 325 MG tablet [id number] Yes Take 325 mg by mouth every 6 (six) hours as needed for mild pain or moderate pain. [provider] Taking Active Self, Pharmacy Records, Spouse/Significant Other  alendronate (FOSAMAX) 70 MG tablet [id number] Yes Take 70 mg by mouth once a week. [provider] Taking Active Self, Pharmacy Records, Spouse/Significant Other  ALPRAZolam  (XANAX ) 0.5 MG tablet [id number] Yes Take 0.5 mg by mouth See admin instructions. Take 1 tablet if needed daily and 2  tablets every evening [provider] Taking Active Self, Pharmacy Records, Spouse/Significant Other  aspirin  81 MG chewable tablet [id number] Yes Chew 1 tablet (81 mg total) by mouth 2 (two) times daily AND 1 tablet (81 mg total) daily. Joette Mustard, MD Taking Active   donepezil  (ARICEPT ) 10 MG tablet [id number] Yes Take 1 tablet (10 mg total) by mouth at bedtime. Wess Hammed, NP Taking Active Pharmacy Records, Self, Spouse/Significant Other  escitalopram  (LEXAPRO ) 20 MG tablet [id number] Yes Take 20 mg by mouth daily. [provider] Taking Active Self, Pharmacy Records, Spouse/Significant Other  ferrous sulfate 325 (65 FE) MG tablet [id number]  Take 1 tablet (325 mg total) by mouth daily with breakfast. Joette Mustard, MD  Active   gabapentin  (NEURONTIN ) 300 MG capsule [id number] Yes Take 1 capsule by mouth daily. [provider] Taking Active Self, Pharmacy Records, Spouse/Significant Other  glipiZIDE (GLUCOTROL XL) 5 MG 24 hr tablet [id number] Yes TAKE 1 TABLET BY MOUTH EVERY DAY FOR 90 DAYS [provider] Taking Active Self, Pharmacy Records, Spouse/Significant Other  hydrochlorothiazide  (MICROZIDE ) 12.5 MG capsule [id number]  Take 1 capsule (12.5 mg total) by mouth daily. Verlyn Goad, MD  Active Pharmacy Records, Self, Spouse/Significant Other  insulin  glargine (LANTUS ) 100 UNIT/ML Solostar Pen [id number] Yes Inject 20 Units into the skin daily. Joette Mustard, MD Taking Active   levETIRAcetam  (KEPPRA ) 1000 MG tablet [id number] Yes Take 1 tablet (1,000 mg total) by mouth 2 (two) times daily. Wess Hammed, NP Taking Active Pharmacy Records, Self, Spouse/Significant Other  losartan  (COZAAR ) 50 MG tablet [id number] Yes Take 1 tablet (50 mg total) by mouth daily. Verlyn Goad, MD Taking Active Pharmacy Records, Self, Spouse/Significant Other  Melatonin 10 MG TABS [id number] Yes Take 2 tablets by  mouth at bedtime. [provider] Taking Active  Self, Pharmacy Records, Spouse/Significant Other  mirtazapine (REMERON) 7.5 MG tablet [id number] Yes Take 1 tablet every day by oral route for 30 days. [provider] Taking Active Self, Pharmacy Records, Spouse/Significant Other  nicotine  (NICODERM CQ  - DOSED IN MG/24 HOURS) 14 mg/24hr patch [id number]  Place 1 patch (14 mg total) onto the skin daily. Joette Mustard, MD  Active   nicotine  polacrilex (NICOTINE  MINI) 4 MG lozenge [id number]  Take 1 lozenge (4 mg total) by mouth as needed for smoking cessation. Joette Mustard, MD  Active   NOVOLOG  FLEXPEN 100 UNIT/ML FlexPen [id number] Yes Please use the hospital sliding scale provided for you  Patient taking differently: Please use the hospital sliding scale provided for you take with each meal   Mariea Shook, MD Taking Active Self, Pharmacy Records, Spouse/Significant Other           Med Note [place]es, [place] S   Tue Aug 04, 2023 10:55 AM) Within 3 hours pt had 24 units on 08/03/2023  oxyCODONE  (OXY IR/ROXICODONE ) 5 MG immediate release tablet [id number] Yes Take 1 tablet (5 mg total) by mouth every 6 (six) hours as needed for severe pain (pain score 7-10). Joette Mustard, MD Taking Active   polyethylene glycol (MIRALAX ) 17 g packet [id number] Yes Take 17 g by mouth daily. Joette Mustard, MD Taking Active   potassium chloride  (KLOR-CON ) 10 MEQ tablet [id number] Yes Take 10 mEq by mouth daily. [provider] Taking Active Self, Pharmacy Records, Spouse/Significant Other  rosuvastatin  (CRESTOR ) 20 MG tablet [id number] Yes Take 20 mg by mouth daily. [provider] Taking Active Self, Pharmacy Records, Spouse/Significant Other  Semaglutide,0.25 or 0.5MG /DOS, (OZEMPIC, 0.25 OR 0.5 MG/DOSE,) 2 MG/3ML SOPN [id number] No INJECT 0.25 MG INTO THE SKIN FOR 4 WEEKS, THEN INJECT 0.5 MG INTO THE SKIN ONCE WEEKLY.  Patient not taking: Reported on 08/24/2023   [provider] Not Taking Active Self, Pharmacy Records,  Spouse/Significant Other           Med Note Broadus [place]es, [place] S   Tue Aug 04, 2023 11:04 AM) Pt has not started yet.  senna (SENOKOT) 8.6 MG TABS tablet [id number] Yes Take 1 tablet (8.6 mg total) by mouth daily. Joette Mustard, MD Taking Active   varenicline (CHANTIX) 1 MG tablet [id number]  Take 0.5 tablets (0.5 mg total) by mouth daily for 3 days, THEN 0.5 tablets (0.5 mg total) 2 (two) times daily for 4 days, THEN 1 tablet (1 mg total) 2 (two) times daily. Joette Mustard, MD  Active             Home Care and Equipment/Supplies: Were Home Health Services Ordered?: Yes Name of Home Health Agency:: [facility]-called working on time to come to home Has Agency set up a time to come to your home?: No (working on time) Any new equipment or medical supplies ordered?: Yes Name of Medical supply agency?: [facility] Were you able to get the equipment/medical supplies?: No Do you have any questions related to the use of the equipment/supplies?: No  Functional Questionnaire: Do you need assistance with bathing/showering or dressing?: No Do you need assistance with meal preparation?: No Do you need assistance with eating?: No Do you have difficulty maintaining continence: No Do you need assistance with getting out of bed/getting out of a chair/moving?: Yes (spouse assists) Do you have difficulty managing or taking your medications?: Yes (spouse manages medication)  Follow up appointments reviewed: PCP Follow-up appointment confirmed?: Yes Date  of PCP follow-up appointment?:  (daughter has set up) Follow-up Provider: Dr. Jenell Mirza Specialist [facility] Follow-up appointment confirmed?: No Reason Specialist Follow-Up Not Confirmed: Patient has Specialist Provider Number and will Call for Appointment (Daughter currently making follow up appointment with Orthopedic Surgeon) Do you need transportation to your follow-up appointment?: No Do you understand care options if your condition(s) worsen?:  Yes-patient verbalized understanding  SDOH Interventions Today    Flowsheet Row Most Recent Value  SDOH Interventions   Food Insecurity Interventions Intervention Not Indicated  Housing Interventions Intervention Not Indicated  Transportation Interventions Intervention Not Indicated  Utilities Interventions Intervention Not Indicated      Wound Management Discussed Monitor for swelling, redness, pain, pus, and fever Keep wound clean and dry.   Notify physician immediately for any changes   Falls Prevention Discussed Keeping moving and stay active Keep your bones strong Go for regular eye checkups Always stand up slowly Wear proper non-slip footwear Light up your living space Use assistive devices.   Pain Management Stay ahead of the pain Get enough sleep Increase physical activity slowly Don't sit too long  Jeffory Snelgrove J. Mataio Mele RN, MSN [facility], [facility] RN Care Manager Direct Dial: [phone]  Fax: [phone] Website: [email or web address]

## 2023-08-25 NOTE — Discharge Summary (Signed)
 Physician Discharge Summary   Patient: Veronica Moses MRN: [id number] DOB: 1955-08-06  Admit date:     08/18/2023  Discharge date: 08/22/2023  Discharge Physician: Joette Mustard   PCP: Wendi Ham, NP   Recommendations at discharge:    None  Discharge Diagnoses: Principal Problem:   Fracture of femoral neck, right (HCC) Active Problems:   Acute exacerbation of chronic obstructive pulmonary disease (COPD) (HCC)   Seizure (HCC)   Hypoalbuminemia due to protein-calorie malnutrition (HCC)   Hypertensive urgency   Essential hypertension   Acute respiratory failure with hypoxia (HCC)   Type 2 diabetes mellitus with hyperglycemia (HCC)   Insomnia   Mixed hyperlipidemia  Resolved Problems:   * No resolved hospital problems. [facility] Course: Per H&P HPI  68 y.o. female with medical history significant of COPD, diabetes mellitus, seizure disorder, tobacco abuse who presented to the emergency department due to fall sustained at home last night.  She states that she lost balance after being bumped into by her dog.  She had persistent pain in the right hip area and was finding it difficult to walk.  She decided to come into the emergency department via ambulance.  Found to have a right hip fracture.  She was hospitalized for further management.   Assessment and Plan:  Right hip fracture (Displaced femoral neck fx)  -s/p R hip hemiarthroplasty with orthopedics PT/OT recommending rehab, patient would like to discharge home, her family is in agreement with this. She will have follow up in 10d with ortho.   COPD with acute exacerbation  Noted to have wheezing on admission. She was started on steroids. She completed a four day course. She had no difficulty breathing on discharge and was off of oxygen .   Hypertensive urgency POA  -Most likely associated with pain initially.  Remained elevated on discharge. All home antihypertensives were resumed on discharged.    History of seizures -No  seizures this hospitalization.  - Continued on home Keppra .   Leukocytosis  Likely reactive in setting of recent of OR and steroid use.  Nothing to do.      Diabetes mellitus type 2 with hyperglycemia -Most likely triggered by the use of for COPD and also stress in setting of an acute fracture. -HbA1c was 8.9 07/2023 Remains poorly controlled.  CBG (last 3)  Recent Labs (last 2 labs)       Recent Labs    08/21/23 1633 08/21/23 2025 08/22/23 0712  GLUCAP 252* 290* 366*      ON insuline at home. She was discharged on her home dose long acting and SSI. She was also discharged on her sulfonylurea.    Hypocalcemia Resolved.   Magnesium  WNL. Vit D WNL. Normal corrected for calcium      Normocytic anemia  Worsening in the setting of recent OR.      Latest Ref Rng & Units 08/22/2023    6:21 AM 08/21/2023    4:06 AM 08/20/2023    4:36 AM  CBC  WBC 4.0 - 10.5 K/uL 11.2  14.5  14.6   Hemoglobin 12.0 - 15.0 g/dL 40.9  81.1  91.4   Hematocrit 36.0 - 46.0 % 33.0  32.4  36.8   Platelets 150 - 400 K/uL 323  347  320     Iron/TIBC/Ferritin/ %Sat    Component Value Date/Time   IRON 17 (L) 08/22/2023 0621   TIBC 260 08/22/2023 0621   FERRITIN 30 08/22/2023 0621   IRONPCTSAT 7 (L) 08/22/2023 7829  She was found to be iron deficient. She was given IV Fe while in the hospital and discharged on PO iron.    Hypokalemia/hypomagnesemia (Resolved)   Hyperlipidemia - Noted home statin continued.    History of dementia/depression - Noted, home Lexapro  and Aricept  continued.   Tobacco use disorder  Started patient on chantix      Pain control - [place]  Controlled Substance Reporting System database was reviewed. and patient was instructed, not to drive, operate heavy machinery, perform activities at heights, swimming or participation in water  activities or provide baby-sitting services while on Pain, Sleep and Anxiety Medications; until their outpatient Physician has advised to do  so again. Also recommended to not to take more than prescribed Pain, Sleep and Anxiety Medications.  Consultants: orthopedics  Procedures performed: R hip hemiarthroplasty   Disposition: Home Diet recommendation:  Discharge Diet Orders (From admission, onward)     Start     Ordered   08/22/23 0000  Diet - low sodium heart healthy        08/22/23 1424   08/22/23 0000  Diet Carb Modified        08/22/23 1424           Carb modified diet DISCHARGE MEDICATION: Allergies as of 08/22/2023       Reactions   Demerol Other (See Comments)   hallucinations   Depakote  [divalproex  Sodium] Other (See Comments)   Sore/blisters   Dilaudid  [hydromorphone  Hcl] Nausea And Vomiting   Doxycycline  Other (See Comments)   "Blistered"   Valproic Acid  Rash        Medication List     STOP taking these medications    Belsomra 20 MG Tabs Generic drug: Suvorexant   cephALEXin  500 MG capsule Commonly known as: KEFLEX    ciprofloxacin 250 MG tablet Commonly known as: CIPRO   diphenhydrAMINE 25 mg capsule Commonly known as: BENADRYL   NYQUIL PO       TAKE these medications    acetaminophen  325 MG tablet Commonly known as: TYLENOL  Take 325 mg by mouth every 6 (six) hours as needed for mild pain or moderate pain.   alendronate 70 MG tablet Commonly known as: FOSAMAX Take 70 mg by mouth once a week.   ALPRAZolam  0.5 MG tablet Commonly known as: XANAX  Take 0.5 mg by mouth See admin instructions. Take 1 tablet if needed daily and 2 tablets every evening   aspirin  81 MG chewable tablet Chew 1 tablet (81 mg total) by mouth 2 (two) times daily AND 1 tablet (81 mg total) daily. What changed: See the new instructions.   donepezil  10 MG tablet Commonly known as: ARICEPT  Take 1 tablet (10 mg total) by mouth at bedtime.   escitalopram  20 MG tablet Commonly known as: LEXAPRO  Take 20 mg by mouth daily.   ferrous sulfate 325 (65 FE) MG tablet Take 1 tablet (325 mg total) by mouth  daily with breakfast.   gabapentin  300 MG capsule Commonly known as: NEURONTIN  Take 1 capsule by mouth daily.   glipiZIDE 5 MG 24 hr tablet Commonly known as: GLUCOTROL XL TAKE 1 TABLET BY MOUTH EVERY DAY FOR 90 DAYS   hydrochlorothiazide  12.5 MG capsule Commonly known as: MICROZIDE  Take 1 capsule (12.5 mg total) by mouth daily.   insulin  glargine 100 UNIT/ML Solostar Pen Commonly known as: LANTUS  Inject 20 Units into the skin daily.   levETIRAcetam  1000 MG tablet Commonly known as: KEPPRA  Take 1 tablet (1,000 mg total) by mouth 2 (two) times daily.  losartan  50 MG tablet Commonly known as: COZAAR  Take 1 tablet (50 mg total) by mouth daily.   Melatonin 10 MG Tabs Take 2 tablets by mouth at bedtime.   mirtazapine 7.5 MG tablet Commonly known as: REMERON Take 1 tablet every day by oral route for 30 days.   nicotine  14 mg/24hr patch Commonly known as: NICODERM CQ  - dosed in mg/24 hours Place 1 patch (14 mg total) onto the skin daily.   nicotine  polacrilex 4 MG lozenge Commonly known as: Nicotine  Mini Take 1 lozenge (4 mg total) by mouth as needed for smoking cessation.   NovoLOG  FlexPen 100 UNIT/ML FlexPen Generic drug: insulin  aspart Please use the hospital sliding scale provided for you What changed: additional instructions   oxyCODONE  5 MG immediate release tablet Commonly known as: Oxy IR/ROXICODONE  Take 1 tablet (5 mg total) by mouth every 6 (six) hours as needed for severe pain (pain score 7-10).   Ozempic (0.25 or 0.5 MG/DOSE) 2 MG/3ML Sopn Generic drug: Semaglutide(0.25 or 0.5MG /DOS) INJECT 0.25 MG INTO THE SKIN FOR 4 WEEKS, THEN INJECT 0.5 MG INTO THE SKIN ONCE WEEKLY.   polyethylene glycol 17 g packet Commonly known as: MiraLax  Take 17 g by mouth daily.   potassium chloride  10 MEQ tablet Commonly known as: KLOR-CON  Take 10 mEq by mouth daily.   rosuvastatin  20 MG tablet Commonly known as: CRESTOR  Take 20 mg by mouth daily.   senna 8.6 MG Tabs  tablet Commonly known as: SENOKOT Take 1 tablet (8.6 mg total) by mouth daily.   varenicline 1 MG tablet Commonly known as: CHANTIX Take 0.5 tablets (0.5 mg total) by mouth daily for 3 days, THEN 0.5 tablets (0.5 mg total) 2 (two) times daily for 4 days, THEN 1 tablet (1 mg total) 2 (two) times daily. Start taking on: August 22, 2023        Follow-up Information     [facility] Follow up.   Specialty: Home Health Services Why: PT will call to schedule your first home visit. Contact information: [address] [place] [address] [phone]         [facility] Follow up.   Why: Rolling walker and Bed side Commode Contact information: [address] [place] [address] [phone]         Wendi Ham, NP Follow up.   Specialty: Family Medicine Contact information: [address] [place] [facility] [address] [phone]         Tonita Frater, MD. Schedule an appointment as soon as possible for a visit in 10 day(s).   Specialties: Orthopedic Surgery, Sports Medicine Why: foR YOUR [facility] Contact information: [address] [place] [address] [phone]                Discharge Exam: Cleavon Curls Weights   08/18/23 1816 08/18/23 2322  Weight: 58 kg 58.6 kg   Physical Exam  Constitutional: In no distress.  Cardiovascular: Normal rate, regular rhythm. No lower extremity edema  Pulmonary: Non labored breathing on room air, no wheezing or rales.   Abdominal: Soft. Normal bowel sounds. Non distended and non tender Musculoskeletal: RLE,   Neurological: Alert and oriented to person, place, and time.   Skin: Skin is warm and dry. Lateral R thigh bandage R/I.    Condition at discharge: fair  The results of significant diagnostics from this hospitalization (including imaging, microbiology, ancillary and laboratory) are listed below for reference.   Imaging Studies:  DG HIP UNILAT WITH PELVIS 2-3  VIEWS RIGHT Result Date: 08/20/2023 CLINICAL DATA:  Status post right hip surgery. EXAM: DG HIP (WITH OR WITHOUT PELVIS) 2-3V RIGHT COMPARISON:  Fluoroscopic images of same day. FINDINGS: Right hip prosthesis is noted in grossly good position. Expected postoperative changes seen in the surrounding soft tissues. IMPRESSION: Status post right hip arthroplasty. Electronically Signed   By: Rosalene Colon M.D.   On: 08/20/2023 15:54   DG HIP UNILAT WITH PELVIS 2-3 VIEWS RIGHT Result Date: 08/20/2023 CLINICAL DATA:  Hemiarthroplasty of right hip. EXAM: DG HIP (WITH OR WITHOUT PELVIS) 2-3V RIGHT COMPARISON:  None Available. FINDINGS: Six fluoroscopic spot views of the pelvis and right hip obtained in the operating room. Sequential images during hip arthroplasty. Fluoroscopy time 19 seconds. Dose 2.506 mGy. IMPRESSION: Intraoperative fluoroscopy during right hip arthroplasty. Electronically Signed   By: Chadwick Colonel M.D.   On: 08/20/2023 15:05   DG C-Arm 1-60 Min-No Report Result Date: 08/20/2023 Fluoroscopy was utilized by the requesting physician.  No radiographic interpretation.   DG C-Arm 1-60 Min-No Report Result Date: 08/20/2023 Fluoroscopy was utilized by the requesting physician.  No radiographic interpretation.   CT Angio Chest PE W and/or Wo Contrast Result Date: 08/19/2023 CLINICAL DATA:  Fall, right femoral neck fracture, positive D-dimer EXAM: CT ANGIOGRAPHY CHEST WITH CONTRAST TECHNIQUE: Multidetector CT imaging of the chest was performed using the standard protocol during bolus administration of intravenous contrast. Multiplanar CT image reconstructions and MIPs were obtained to evaluate the vascular anatomy. RADIATION DOSE REDUCTION: This exam was performed according to the departmental dose-optimization program which includes automated exposure control, adjustment of the mA and/or kV according to patient size and/or use of iterative reconstruction technique. CONTRAST:  75mL OMNIPAQUE  IOHEXOL   350 MG/ML SOLN COMPARISON:  Chest radiograph dated 08/18/2023 FINDINGS: Cardiovascular: Satisfactory opacification the bilateral pulmonary arteries to the segmental level. No evidence of pulmonary embolism. Although not tailored for evaluation of the thoracic aorta, there is no evidence of thoracic aortic aneurysm or dissection. Mild atherosclerotic calcifications of the aortic arch. Mild cardiomegaly.  No pericardial effusion. Moderate three-vessel coronary atherosclerosis. Mediastinum/Nodes: No suspicious mediastinal lymphadenopathy. Visualized thyroid  is unremarkable. Lungs/Pleura: Evaluation of the lung parenchyma is constrained by respiratory motion. Within that constraint, there are no suspicious pulmonary nodules. Mild patchy opacities in the dependent upper and lower lobes, likely atelectasis. Additional platelike atelectasis inferiorly in the right upper lobe. No focal consolidation. No pleural effusion or pneumothorax. Upper Abdomen: Visualized upper abdomen is notable for prior cholecystectomy and vascular calcifications. 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 Musculoskeletal: Visualized osseous structures are within normal limits. Review of the MIP images confirms the above findings. IMPRESSION: No evidence of pulmonary embolism. Mild cardiomegaly.  Scattered atelectasis. Aortic Atherosclerosis (ICD10-I70.0). Electronically Signed   By: Zadie Herter M.D.   On: 08/19/2023 02:18   CT Hip Right Wo Contrast Result Date: 08/18/2023 CLINICAL DATA:  Trauma. EXAM: CT OF THE RIGHT HIP WITHOUT CONTRAST TECHNIQUE: Multidetector CT imaging of the right hip was performed according to the standard protocol. Multiplanar CT image reconstructions were also generated. RADIATION DOSE REDUCTION: This exam was performed according to the departmental dose-optimization program which includes automated exposure control, adjustment of the mA and/or kV according to patient size and/or use of iterative  reconstruction technique. COMPARISON:  X-ray same day FINDINGS: Bones/Joint/Cartilage The bones are osteopenic. There is a mildly impacted transverse fracture of the right femoral neck. There is no dislocation. Joint effusion is present. Ligaments Suboptimally assessed  by CT. Muscles and Tendons No focal hematoma. Soft tissues No focal hematoma or fluid collection. IMPRESSION: Mildly impacted transverse fracture of the right femoral neck. Electronically Signed   By: Tyron Gallon M.D.   On: 08/18/2023 21:33   CT Lumbar Spine Wo Contrast Result Date: 08/18/2023 CLINICAL DATA:  Low back pain EXAM: CT LUMBAR SPINE WITHOUT CONTRAST TECHNIQUE: Multidetector CT imaging of the lumbar spine was performed without intravenous contrast administration. Multiplanar CT image reconstructions were also generated. RADIATION DOSE REDUCTION: This exam was performed according to the departmental dose-optimization program which includes automated exposure control, adjustment of the mA and/or kV according to patient size and/or use of iterative reconstruction technique. COMPARISON:  None Available. FINDINGS: Segmentation: 5 lumbar type vertebrae. Alignment: Normal. Vertebrae: No acute fracture or focal pathologic process. Paraspinal and other soft tissues: Negative. Surgical changes of left nephrectomy noted. Disc levels: Intervertebral disc heights are preserved. No significant neuroforaminal narrowing. Mild to moderate facet arthrosis, most severe at L2-3 and L4-5. Mild multifactorial central canal stenosis at L2-L5,. The osseous structures are diffusely osteopenic IMPRESSION: 1. No acute osseous abnormality of the lumbar spine. 2. Mild to moderate facet arthrosis, most severe at L2-3 and L4-5. 3. Mild multifactorial central canal stenosis at L2-L5. Electronically Signed   By: Worthy Heads M.D.   On: 08/18/2023 20:29   DG Chest Portable 1 View Result Date: 08/18/2023 CLINICAL DATA:  Status post fall, right hip fracture. EXAM:  PORTABLE CHEST 1 VIEW COMPARISON:  08/03/2023 FINDINGS: Stable cardiomegaly. Unchanged mediastinal contours. No acute airspace disease, pulmonary edema, large pleural effusion or pneumothorax. On limited assessment, no acute osseous findings. IMPRESSION: Stable cardiomegaly. No acute findings. Electronically Signed   By: Chadwick Colonel M.D.   On: 08/18/2023 19:35   DG Hip Unilat W or Wo Pelvis 2-3 Views Right Result Date: 08/18/2023 CLINICAL DATA:  Right hip pain after fall.  Unable to bear weight. EXAM: DG HIP (WITH OR WITHOUT PELVIS) 2-3V RIGHT COMPARISON:  None Available. FINDINGS: Impacted right femoral neck fracture. No significant displacement. The femoral head remains seated. The bones are diffusely under mineralized. Pubic rami are intact. Pubic symphysis and sacroiliac joints are congruent. IMPRESSION: Impacted right femoral neck fracture. Electronically Signed   By: Chadwick Colonel M.D.   On: 08/18/2023 19:34   DG Chest 2 View Result Date: 08/04/2023 CLINICAL DATA:  eval for epigastric pain EXAM: CHEST - 2 VIEW COMPARISON:  Chest x-ray 05/01/2022 FINDINGS: The heart and mediastinal contours are unchanged. Atherosclerotic plaque. No focal consolidation. No pulmonary edema. No pleural effusion. No pneumothorax. No acute osseous abnormality. IMPRESSION: 1. No active cardiopulmonary disease. 2.  Aortic Atherosclerosis (ICD10-I70.0). Electronically Signed   By: Morgane  Naveau M.D.   On: 08/04/2023 00:00   CT Renal Stone Study Result Date: 08/03/2023 CLINICAL DATA:  Epigastric pain EXAM: CT ABDOMEN AND PELVIS WITHOUT CONTRAST TECHNIQUE: Multidetector CT imaging of the abdomen and pelvis was performed following the standard protocol without IV contrast. RADIATION DOSE REDUCTION: This exam was performed according to the departmental dose-optimization program which includes automated exposure control, adjustment of the mA and/or kV according to patient size and/or use of iterative reconstruction  technique. COMPARISON:  CT 11/28/2021 FINDINGS: Lower chest: Lung bases demonstrate no acute airspace disease. Coronary vascular calcification. Hepatobiliary: Cholecystectomy. Similar mild enlargement of common bile duct. Pancreas: Unremarkable. No pancreatic ductal dilatation or surrounding inflammatory changes. Spleen: Normal in size without focal abnormality. Adrenals/Urinary Tract: Adrenal glands are normal. Status post left nephrectomy. There is mild right hydronephrosis  and hydroureter, but no obstructing stone. Distended urinary bladder. Stomach/Bowel: Stomach nonenlarged. No dilated small bowel. No acute bowel wall thickening. Vascular/Lymphatic: Aortic atherosclerosis. No enlarged abdominal or pelvic lymph nodes. Reproductive: Status post hysterectomy. No adnexal masses. Other: Negative for pelvic effusion or free air. Clips in the left pelvis and retroperitoneum Musculoskeletal: No acute or suspicious osseous abnormality IMPRESSION: 1. Mild right hydronephrosis and hydroureter, but no obstructing stone. There is moderate urinary bladder distention. 2. Status post left nephrectomy. 3. Aortic atherosclerosis. Aortic Atherosclerosis (ICD10-I70.0). Electronically Signed   By: Esmeralda Hedge M.D.   On: 08/03/2023 23:47    Microbiology: Results for orders placed or performed during the hospital encounter of 08/18/23  Surgical pcr screen     Status: None   Collection Time: 08/19/23  3:25 PM   Specimen: Nasal Mucosa; Nasal Swab  Result Value Ref Range Status   MRSA, PCR NEGATIVE NEGATIVE Final   Staphylococcus aureus NEGATIVE NEGATIVE Final    Comment: (NOTE) The Xpert SA Assay (FDA approved for NASAL specimens in patients 31 years of age and older), is one component of a comprehensive surveillance program. It is not intended to diagnose infection nor to guide or monitor treatment. Performed at [facility], [address] [place], [place] [address]   MRSA Next Gen by PCR, Nasal     Status: None    Collection Time: 08/20/23 10:43 AM   Specimen: Nasal Mucosa; Nasal Swab  Result Value Ref Range Status   MRSA by PCR Next Gen NOT DETECTED NOT DETECTED Final    Comment: (NOTE) The GeneXpert MRSA Assay (FDA approved for NASAL specimens only), is one component of a comprehensive MRSA colonization surveillance program. It is not intended to diagnose MRSA infection nor to guide or monitor treatment for MRSA infections. Test performance is not FDA approved in patients less than 27 years old. Performed at [facility], [address], [place], [place] [address]     Labs: CBC: Recent Labs  Lab 08/19/23 0428 08/20/23 0436 08/21/23 0406 08/22/23 0621  WBC 9.6 14.6* 14.5* 11.2*  NEUTROABS  --   --   --  9.3*  HGB 12.7 11.7* 10.0* 10.5*  HCT 40.3 36.8 32.4* 33.0*  MCV 90.8 90.6 92.8 93.8  PLT 282 320 347 323   Basic Metabolic Panel: Recent Labs  Lab 08/19/23 0428 08/19/23 2158 08/20/23 0436 08/21/23 0406 08/21/23 1037 08/21/23 1737 08/22/23 0621  NA 134* 128* 140 140  --  134* 139  K 3.7 4.1 4.2 3.9  --  3.7 4.7  CL 100 94* 104 106  --  102 105  CO2 26 23 28 25   --  23 25  GLUCOSE 332* 542* 176* 137*  --  262* 363*  BUN 9 16 15 16   --  17 18  CREATININE 0.84 0.88 0.74 0.66  --  0.68 0.71  CALCIUM  8.5* 8.3* 8.7* 7.6*  --  8.1* 8.1*  MG 1.6* 2.3  --   --  2.0  --   --   PHOS 2.5  --   --   --   --   --  2.5   Liver Function Tests: Recent Labs  Lab 08/19/23 0428 08/22/23 0621  AST 33 27  ALT 25 17  ALKPHOS 28* 28*  BILITOT 0.6 0.7  PROT 7.4 5.8*  ALBUMIN 3.5 2.7*   CBG: Recent Labs  Lab 08/21/23 1633 08/21/23 2025 08/22/23 0712 08/22/23 1112 08/22/23 1413  GLUCAP 252* 290* 366* 323* 254*  Discharge time spent: greater than 30 minutes.  Signed: Joette Mustard, MD [facility] 08/25/2023

## 2023-08-26 ENCOUNTER — Telehealth: Payer: Self-pay | Admitting: Orthopedic Surgery

## 2023-08-26 DIAGNOSIS — I119 Hypertensive heart disease without heart failure: Secondary | ICD-10-CM | POA: Diagnosis not present

## 2023-08-26 DIAGNOSIS — Z96641 Presence of right artificial hip joint: Secondary | ICD-10-CM | POA: Diagnosis not present

## 2023-08-26 DIAGNOSIS — J441 Chronic obstructive pulmonary disease with (acute) exacerbation: Secondary | ICD-10-CM | POA: Diagnosis not present

## 2023-08-26 DIAGNOSIS — J9601 Acute respiratory failure with hypoxia: Secondary | ICD-10-CM | POA: Diagnosis not present

## 2023-08-26 DIAGNOSIS — M47816 Spondylosis without myelopathy or radiculopathy, lumbar region: Secondary | ICD-10-CM | POA: Diagnosis not present

## 2023-08-26 DIAGNOSIS — E1165 Type 2 diabetes mellitus with hyperglycemia: Secondary | ICD-10-CM | POA: Diagnosis not present

## 2023-08-26 DIAGNOSIS — G40909 Epilepsy, unspecified, not intractable, without status epilepticus: Secondary | ICD-10-CM | POA: Diagnosis not present

## 2023-08-26 DIAGNOSIS — M48061 Spinal stenosis, lumbar region without neurogenic claudication: Secondary | ICD-10-CM | POA: Diagnosis not present

## 2023-08-26 DIAGNOSIS — S72091D Other fracture of head and neck of right femur, subsequent encounter for closed fracture with routine healing: Secondary | ICD-10-CM | POA: Diagnosis not present

## 2023-08-26 NOTE — Telephone Encounter (Signed)
 Dr. Ernesta Heading pt - Veronica Moses PT w/[facility] [phone] lvm stating they saw her today for start of care, recommending 2w3 and 1w1, need verbal orders

## 2023-08-26 NOTE — Telephone Encounter (Signed)
 Left VM w/ verbal order

## 2023-08-28 DIAGNOSIS — J441 Chronic obstructive pulmonary disease with (acute) exacerbation: Secondary | ICD-10-CM | POA: Diagnosis not present

## 2023-08-28 DIAGNOSIS — G40909 Epilepsy, unspecified, not intractable, without status epilepticus: Secondary | ICD-10-CM | POA: Diagnosis not present

## 2023-08-28 DIAGNOSIS — I119 Hypertensive heart disease without heart failure: Secondary | ICD-10-CM | POA: Diagnosis not present

## 2023-08-28 DIAGNOSIS — E1165 Type 2 diabetes mellitus with hyperglycemia: Secondary | ICD-10-CM | POA: Diagnosis not present

## 2023-08-28 DIAGNOSIS — J9601 Acute respiratory failure with hypoxia: Secondary | ICD-10-CM | POA: Diagnosis not present

## 2023-08-28 DIAGNOSIS — Z96641 Presence of right artificial hip joint: Secondary | ICD-10-CM | POA: Diagnosis not present

## 2023-08-28 DIAGNOSIS — M47816 Spondylosis without myelopathy or radiculopathy, lumbar region: Secondary | ICD-10-CM | POA: Diagnosis not present

## 2023-08-28 DIAGNOSIS — S72091D Other fracture of head and neck of right femur, subsequent encounter for closed fracture with routine healing: Secondary | ICD-10-CM | POA: Diagnosis not present

## 2023-08-28 DIAGNOSIS — M48061 Spinal stenosis, lumbar region without neurogenic claudication: Secondary | ICD-10-CM | POA: Diagnosis not present

## 2023-08-31 ENCOUNTER — Encounter (HOSPITAL_COMMUNITY): Payer: Self-pay | Admitting: Emergency Medicine

## 2023-08-31 ENCOUNTER — Other Ambulatory Visit: Payer: Self-pay

## 2023-08-31 ENCOUNTER — Telehealth: Payer: Self-pay | Admitting: Orthopedic Surgery

## 2023-08-31 ENCOUNTER — Emergency Department (HOSPITAL_COMMUNITY)

## 2023-08-31 ENCOUNTER — Inpatient Hospital Stay (HOSPITAL_COMMUNITY)
Admission: EM | Admit: 2023-08-31 | Discharge: 2023-09-02 | DRG: 193 | Disposition: A | Discharge status: Home Health | Attending: Family Medicine | Admitting: Family Medicine

## 2023-08-31 DIAGNOSIS — E782 Mixed hyperlipidemia: Secondary | ICD-10-CM | POA: Diagnosis present

## 2023-08-31 DIAGNOSIS — Z90710 Acquired absence of both cervix and uterus: Secondary | ICD-10-CM

## 2023-08-31 DIAGNOSIS — I119 Hypertensive heart disease without heart failure: Secondary | ICD-10-CM | POA: Diagnosis not present

## 2023-08-31 DIAGNOSIS — F17210 Nicotine dependence, cigarettes, uncomplicated: Secondary | ICD-10-CM | POA: Diagnosis present

## 2023-08-31 DIAGNOSIS — R739 Hyperglycemia, unspecified: Secondary | ICD-10-CM | POA: Diagnosis not present

## 2023-08-31 DIAGNOSIS — S098XXA Other specified injuries of head, initial encounter: Secondary | ICD-10-CM | POA: Diagnosis present

## 2023-08-31 DIAGNOSIS — R410 Disorientation, unspecified: Secondary | ICD-10-CM

## 2023-08-31 DIAGNOSIS — I959 Hypotension, unspecified: Secondary | ICD-10-CM | POA: Diagnosis not present

## 2023-08-31 DIAGNOSIS — Z96641 Presence of right artificial hip joint: Secondary | ICD-10-CM | POA: Diagnosis present

## 2023-08-31 DIAGNOSIS — W1830XA Fall on same level, unspecified, initial encounter: Secondary | ICD-10-CM | POA: Diagnosis present

## 2023-08-31 DIAGNOSIS — Z885 Allergy status to narcotic agent status: Secondary | ICD-10-CM

## 2023-08-31 DIAGNOSIS — Y92009 Unspecified place in unspecified non-institutional (private) residence as the place of occurrence of the external cause: Secondary | ICD-10-CM

## 2023-08-31 DIAGNOSIS — Z794 Long term (current) use of insulin: Secondary | ICD-10-CM

## 2023-08-31 DIAGNOSIS — G40909 Epilepsy, unspecified, not intractable, without status epilepticus: Secondary | ICD-10-CM | POA: Diagnosis present

## 2023-08-31 DIAGNOSIS — M19032 Primary osteoarthritis, left wrist: Secondary | ICD-10-CM | POA: Diagnosis not present

## 2023-08-31 DIAGNOSIS — R918 Other nonspecific abnormal finding of lung field: Secondary | ICD-10-CM | POA: Diagnosis not present

## 2023-08-31 DIAGNOSIS — Z8249 Family history of ischemic heart disease and other diseases of the circulatory system: Secondary | ICD-10-CM

## 2023-08-31 DIAGNOSIS — Z7984 Long term (current) use of oral hypoglycemic drugs: Secondary | ICD-10-CM

## 2023-08-31 DIAGNOSIS — G4700 Insomnia, unspecified: Secondary | ICD-10-CM | POA: Diagnosis not present

## 2023-08-31 DIAGNOSIS — Z7985 Long-term (current) use of injectable non-insulin antidiabetic drugs: Secondary | ICD-10-CM | POA: Diagnosis not present

## 2023-08-31 DIAGNOSIS — Z881 Allergy status to other antibiotic agents status: Secondary | ICD-10-CM

## 2023-08-31 DIAGNOSIS — S52602A Unspecified fracture of lower end of left ulna, initial encounter for closed fracture: Secondary | ICD-10-CM | POA: Diagnosis not present

## 2023-08-31 DIAGNOSIS — R0902 Hypoxemia: Secondary | ICD-10-CM | POA: Diagnosis not present

## 2023-08-31 DIAGNOSIS — Z8673 Personal history of transient ischemic attack (TIA), and cerebral infarction without residual deficits: Secondary | ICD-10-CM | POA: Diagnosis not present

## 2023-08-31 DIAGNOSIS — Z79899 Other long term (current) drug therapy: Secondary | ICD-10-CM

## 2023-08-31 DIAGNOSIS — W19XXXA Unspecified fall, initial encounter: Secondary | ICD-10-CM

## 2023-08-31 DIAGNOSIS — T380X5A Adverse effect of glucocorticoids and synthetic analogues, initial encounter: Secondary | ICD-10-CM | POA: Diagnosis present

## 2023-08-31 DIAGNOSIS — Z833 Family history of diabetes mellitus: Secondary | ICD-10-CM | POA: Diagnosis not present

## 2023-08-31 DIAGNOSIS — J9601 Acute respiratory failure with hypoxia: Secondary | ICD-10-CM | POA: Diagnosis present

## 2023-08-31 DIAGNOSIS — Z905 Acquired absence of kidney: Secondary | ICD-10-CM

## 2023-08-31 DIAGNOSIS — R569 Unspecified convulsions: Secondary | ICD-10-CM | POA: Diagnosis not present

## 2023-08-31 DIAGNOSIS — M1612 Unilateral primary osteoarthritis, left hip: Secondary | ICD-10-CM | POA: Diagnosis not present

## 2023-08-31 DIAGNOSIS — Z7983 Long term (current) use of bisphosphonates: Secondary | ICD-10-CM

## 2023-08-31 DIAGNOSIS — Z6832 Body mass index (BMI) 32.0-32.9, adult: Secondary | ICD-10-CM

## 2023-08-31 DIAGNOSIS — G9341 Metabolic encephalopathy: Secondary | ICD-10-CM | POA: Diagnosis present

## 2023-08-31 DIAGNOSIS — Z7982 Long term (current) use of aspirin: Secondary | ICD-10-CM

## 2023-08-31 DIAGNOSIS — J441 Chronic obstructive pulmonary disease with (acute) exacerbation: Secondary | ICD-10-CM | POA: Diagnosis not present

## 2023-08-31 DIAGNOSIS — S52612A Displaced fracture of left ulna styloid process, initial encounter for closed fracture: Secondary | ICD-10-CM | POA: Diagnosis not present

## 2023-08-31 DIAGNOSIS — M8580 Other specified disorders of bone density and structure, unspecified site: Secondary | ICD-10-CM | POA: Diagnosis not present

## 2023-08-31 DIAGNOSIS — I517 Cardiomegaly: Secondary | ICD-10-CM | POA: Diagnosis not present

## 2023-08-31 DIAGNOSIS — I10 Essential (primary) hypertension: Secondary | ICD-10-CM | POA: Diagnosis present

## 2023-08-31 DIAGNOSIS — G319 Degenerative disease of nervous system, unspecified: Secondary | ICD-10-CM | POA: Diagnosis not present

## 2023-08-31 DIAGNOSIS — R9089 Other abnormal findings on diagnostic imaging of central nervous system: Secondary | ICD-10-CM | POA: Diagnosis not present

## 2023-08-31 DIAGNOSIS — E66811 Obesity, class 1: Secondary | ICD-10-CM | POA: Diagnosis present

## 2023-08-31 DIAGNOSIS — S72091D Other fracture of head and neck of right femur, subsequent encounter for closed fracture with routine healing: Secondary | ICD-10-CM | POA: Diagnosis not present

## 2023-08-31 DIAGNOSIS — E1165 Type 2 diabetes mellitus with hyperglycemia: Secondary | ICD-10-CM | POA: Diagnosis not present

## 2023-08-31 DIAGNOSIS — I700 Atherosclerosis of aorta: Secondary | ICD-10-CM | POA: Diagnosis not present

## 2023-08-31 DIAGNOSIS — Z043 Encounter for examination and observation following other accident: Secondary | ICD-10-CM | POA: Diagnosis not present

## 2023-08-31 DIAGNOSIS — M47816 Spondylosis without myelopathy or radiculopathy, lumbar region: Secondary | ICD-10-CM | POA: Diagnosis not present

## 2023-08-31 DIAGNOSIS — J189 Pneumonia, unspecified organism: Secondary | ICD-10-CM | POA: Diagnosis not present

## 2023-08-31 DIAGNOSIS — J440 Chronic obstructive pulmonary disease with acute lower respiratory infection: Secondary | ICD-10-CM | POA: Diagnosis present

## 2023-08-31 DIAGNOSIS — R000 Tachycardia, unspecified: Secondary | ICD-10-CM | POA: Diagnosis not present

## 2023-08-31 DIAGNOSIS — E8809 Other disorders of plasma-protein metabolism, not elsewhere classified: Secondary | ICD-10-CM | POA: Diagnosis present

## 2023-08-31 DIAGNOSIS — E46 Unspecified protein-calorie malnutrition: Secondary | ICD-10-CM

## 2023-08-31 DIAGNOSIS — M48061 Spinal stenosis, lumbar region without neurogenic claudication: Secondary | ICD-10-CM | POA: Diagnosis not present

## 2023-08-31 DIAGNOSIS — M25532 Pain in left wrist: Secondary | ICD-10-CM | POA: Diagnosis not present

## 2023-08-31 DIAGNOSIS — S0990XA Unspecified injury of head, initial encounter: Secondary | ICD-10-CM | POA: Diagnosis not present

## 2023-08-31 DIAGNOSIS — R0602 Shortness of breath: Secondary | ICD-10-CM | POA: Diagnosis not present

## 2023-08-31 DIAGNOSIS — Y92002 Bathroom of unspecified non-institutional (private) residence single-family (private) house as the place of occurrence of the external cause: Secondary | ICD-10-CM | POA: Diagnosis not present

## 2023-08-31 DIAGNOSIS — Z888 Allergy status to other drugs, medicaments and biological substances status: Secondary | ICD-10-CM

## 2023-08-31 DIAGNOSIS — R9082 White matter disease, unspecified: Secondary | ICD-10-CM | POA: Diagnosis not present

## 2023-08-31 LAB — URINALYSIS, ROUTINE W REFLEX MICROSCOPIC
Bacteria, UA: NONE SEEN
Bilirubin Urine: NEGATIVE
Glucose, UA: NEGATIVE mg/dL
Hgb urine dipstick: NEGATIVE
Ketones, ur: NEGATIVE mg/dL
Leukocytes,Ua: NEGATIVE
Nitrite: POSITIVE — AB
Protein, ur: NEGATIVE mg/dL
Specific Gravity, Urine: 1.017 (ref 1.005–1.030)
pH: 6 (ref 5.0–8.0)

## 2023-08-31 LAB — LIPASE, BLOOD: Lipase: 21 U/L (ref 11–51)

## 2023-08-31 LAB — BLOOD GAS, VENOUS
Acid-Base Excess: 4.7 mmol/L — ABNORMAL HIGH (ref 0.0–2.0)
Bicarbonate: 30.9 mmol/L — ABNORMAL HIGH (ref 20.0–28.0)
Drawn by: 7012
O2 Saturation: 61.5 %
Patient temperature: 37.2
pCO2, Ven: 51 mmHg (ref 44–60)
pH, Ven: 7.39 (ref 7.25–7.43)
pO2, Ven: 41 mmHg (ref 32–45)

## 2023-08-31 LAB — HEPATIC FUNCTION PANEL
ALT: 28 U/L (ref 0–44)
AST: 37 U/L (ref 15–41)
Albumin: 3.1 g/dL — ABNORMAL LOW (ref 3.5–5.0)
Alkaline Phosphatase: 39 U/L (ref 38–126)
Bilirubin, Direct: <0.1 mg/dL (ref 0.0–0.2)
Total Bilirubin: 0.3 mg/dL (ref 0.0–1.2)
Total Protein: 6.7 g/dL (ref 6.5–8.1)

## 2023-08-31 LAB — CBC
HCT: 38.5 % (ref 36.0–46.0)
Hemoglobin: 12.1 g/dL (ref 12.0–15.0)
MCH: 30.2 pg (ref 26.0–34.0)
MCHC: 31.4 g/dL (ref 30.0–36.0)
MCV: 96 fL (ref 80.0–100.0)
Platelets: 303 10*3/uL (ref 150–400)
RBC: 4.01 MIL/uL (ref 3.87–5.11)
RDW: 16.5 % — ABNORMAL HIGH (ref 11.5–15.5)
WBC: 10.4 10*3/uL (ref 4.0–10.5)
nRBC: 0 % (ref 0.0–0.2)

## 2023-08-31 LAB — GLUCOSE, CAPILLARY: Glucose-Capillary: 518 mg/dL (ref 70–99)

## 2023-08-31 LAB — BASIC METABOLIC PANEL WITH GFR
Anion gap: 11 (ref 5–15)
BUN: 11 mg/dL (ref 8–23)
CO2: 31 mmol/L (ref 22–32)
Calcium: 9.8 mg/dL (ref 8.9–10.3)
Chloride: 96 mmol/L — ABNORMAL LOW (ref 98–111)
Creatinine, Ser: 0.93 mg/dL (ref 0.44–1.00)
GFR, Estimated: >60 mL/min (ref >60)
Glucose, Bld: 278 mg/dL — ABNORMAL HIGH (ref 70–99)
Potassium: 3.5 mmol/L (ref 3.5–5.1)
Sodium: 138 mmol/L (ref 135–145)

## 2023-08-31 MED ORDER — INSULIN ASPART 100 UNIT/ML IJ SOLN: 0.0000 [IU] | INTRAMUSCULAR | Status: DC

## 2023-08-31 MED ORDER — ROSUVASTATIN CALCIUM 20 MG PO TABS
20.0000 mg | ORAL_TABLET | Freq: Every day | ORAL | Status: DC
  Administered 2023-09-01 – 2023-09-02 (×2): 20 mg via ORAL
  Filled 2023-08-31 (×2): qty 1

## 2023-08-31 MED ORDER — PROCHLORPERAZINE EDISYLATE 10 MG/2ML IJ SOLN: 10.0000 mg | Freq: Four times a day (QID) | INTRAMUSCULAR | Status: DC | PRN

## 2023-08-31 MED ORDER — METHYLPREDNISOLONE SODIUM SUCC 40 MG IJ SOLR
40.0000 mg | Freq: Two times a day (BID) | INTRAMUSCULAR | Status: DC
  Administered 2023-08-31: 40 mg via INTRAVENOUS
  Filled 2023-08-31 (×2): qty 1

## 2023-08-31 MED ORDER — ALPRAZOLAM 0.5 MG PO TABS
1.0000 mg | ORAL_TABLET | Freq: Every day | ORAL | Status: DC
  Administered 2023-08-31: 1 mg via ORAL
  Filled 2023-08-31: qty 1

## 2023-08-31 MED ORDER — ALPRAZOLAM 0.5 MG PO TABS: 0.5000 mg | ORAL_TABLET | Freq: Every day | ORAL | Status: DC | PRN

## 2023-08-31 MED ORDER — DM-GUAIFENESIN ER 30-600 MG PO TB12
1.0000 | ORAL_TABLET | Freq: Two times a day (BID) | ORAL | Status: DC
  Administered 2023-08-31 – 2023-09-02 (×4): 1 via ORAL
  Filled 2023-08-31 (×4): qty 1

## 2023-08-31 MED ORDER — ACETAMINOPHEN 650 MG RE SUPP
650.0000 mg | Freq: Four times a day (QID) | RECTAL | Status: DC | PRN
Start: 2023-08-31 — End: 2023-09-02

## 2023-08-31 MED ORDER — DONEPEZIL HCL 5 MG PO TABS
10.0000 mg | ORAL_TABLET | Freq: Every day | ORAL | Status: DC
  Administered 2023-08-31 – 2023-09-01 (×2): 10 mg via ORAL
  Filled 2023-08-31 (×2): qty 2

## 2023-08-31 MED ORDER — GUAIFENESIN-DM 100-10 MG/5ML PO SYRP: 5.0000 mL | ORAL_SOLUTION | ORAL | Status: DC | PRN

## 2023-08-31 MED ORDER — GLUCERNA SHAKE PO LIQD: 237.0000 mL | Freq: Three times a day (TID) | ORAL | Status: DC

## 2023-08-31 MED ORDER — IOHEXOL 350 MG/ML SOLN
75.0000 mL | Freq: Once | INTRAVENOUS | Status: AC | PRN
  Administered 2023-08-31: 75 mL via INTRAVENOUS

## 2023-08-31 MED ORDER — SODIUM CHLORIDE 0.9 % IV SOLN: Freq: Once | INTRAVENOUS | Status: AC

## 2023-08-31 MED ORDER — AZITHROMYCIN 250 MG PO TABS
250.0000 mg | ORAL_TABLET | Freq: Every day | ORAL | Status: DC
  Administered 2023-09-02: 250 mg via ORAL
  Filled 2023-08-31: qty 1

## 2023-08-31 MED ORDER — AZITHROMYCIN 250 MG PO TABS
500.0000 mg | ORAL_TABLET | Freq: Every day | ORAL | Status: AC
  Administered 2023-09-01: 500 mg via ORAL
  Filled 2023-08-31: qty 2

## 2023-08-31 MED ORDER — PANTOPRAZOLE SODIUM 40 MG PO TBEC
40.0000 mg | DELAYED_RELEASE_TABLET | Freq: Every day | ORAL | Status: DC
  Administered 2023-09-01 – 2023-09-02 (×2): 40 mg via ORAL
  Filled 2023-08-31 (×2): qty 1

## 2023-08-31 MED ORDER — LEVETIRACETAM 500 MG PO TABS
1000.0000 mg | ORAL_TABLET | Freq: Two times a day (BID) | ORAL | Status: DC
  Administered 2023-08-31 – 2023-09-02 (×4): 1000 mg via ORAL
  Filled 2023-08-31 (×4): qty 2

## 2023-08-31 MED ORDER — MORPHINE SULFATE (PF) 4 MG/ML IV SOLN
4.0000 mg | Freq: Once | INTRAVENOUS | Status: AC
  Administered 2023-08-31: 4 mg via INTRAVENOUS
  Filled 2023-08-31: qty 1

## 2023-08-31 MED ORDER — IPRATROPIUM-ALBUTEROL 0.5-2.5 (3) MG/3ML IN SOLN
3.0000 mL | Freq: Four times a day (QID) | RESPIRATORY_TRACT | Status: DC
  Administered 2023-08-31 – 2023-09-02 (×7): 3 mL via RESPIRATORY_TRACT
  Filled 2023-08-31 (×7): qty 3

## 2023-08-31 MED ORDER — LOSARTAN POTASSIUM 50 MG PO TABS
50.0000 mg | ORAL_TABLET | Freq: Every day | ORAL | Status: DC
  Administered 2023-09-01 – 2023-09-02 (×2): 50 mg via ORAL
  Filled 2023-08-31 (×2): qty 1

## 2023-08-31 MED ORDER — IPRATROPIUM-ALBUTEROL 0.5-2.5 (3) MG/3ML IN SOLN
3.0000 mL | Freq: Once | RESPIRATORY_TRACT | Status: AC
  Administered 2023-08-31: 3 mL via RESPIRATORY_TRACT
  Filled 2023-08-31: qty 3

## 2023-08-31 MED ORDER — ACETAMINOPHEN 325 MG PO TABS
650.0000 mg | ORAL_TABLET | Freq: Four times a day (QID) | ORAL | Status: DC | PRN
  Administered 2023-08-31: 650 mg via ORAL
  Filled 2023-08-31: qty 2

## 2023-08-31 MED ORDER — IPRATROPIUM-ALBUTEROL 0.5-2.5 (3) MG/3ML IN SOLN: 3.0000 mL | RESPIRATORY_TRACT | Status: DC | PRN

## 2023-08-31 MED ORDER — MORPHINE SULFATE (PF) 2 MG/ML IV SOLN: 2.0000 mg | INTRAVENOUS | Status: DC | PRN

## 2023-08-31 NOTE — Telephone Encounter (Signed)
 I called patient to advise her husband states he is taking her to hospital now. He states she was going to be sent home with an inhaler but did not get one sent in. He thinks this may be what she needs, but is following the advise of the physical therapist and is on way now to hospital    I called Lovena Rubinstein PT to let him know that she is headed to ER now  Husband states that he may not be able to bring her tomorrow as scheduled if they keep her at hospital for the low O2 levels  To you FYI

## 2023-08-31 NOTE — ED Provider Notes (Signed)
 [place] EMERGENCY DEPARTMENT AT [facility] Provider Note   CSN: [id number] Arrival date & time: 08/31/23  1321     Patient presents with: Shortness of Breath   Veronica Moses is a 68 y.o. female.   Patient is a 68 year old female with past medical history of diabetes, hypertension, stroke in 2016, seizure disorder on Keppra , and dementia presenting for fall, hypoxia, and confusion.  Currently the patient is a poor historian and is unable to tell me where she is or identify her daughter at the bedside.  Her daughter states in the middle of the night last night the patient's husband heard a fall in the spare bathroom and found the patient lying on the floor.  She admitted to blunt head trauma.  She also admits to pain in her left wrist.  She stated she was attempting to go to the bathroom.  Her daughter found the strange as she normally uses the restroom in her own bedroom opposed to going down the hall to the spare bedroom bathroom.  She states she has had a cough lately but denies any fevers, chills, nausea, vomiting, lethargy, or weakness.  She is a current smoker.  She was found to have a low oxygen  reading in the 80s at physical therapy today.  As per family, they just got an home pulse oximetry device, but her baseline is unknown.  At the bedside her daughter is in tears because the patient just recently in the last 10 minutes can no longer identify her daughter which is unlike her baseline dementia.  Other history to note, patient had a right hip arthroplasty 11 days ago on 08/20/2023  The history is provided by the patient. No language interpreter was used.  Shortness of Breath Associated symptoms: no abdominal pain, no chest pain, no cough, no ear pain, no fever, no rash, no sore throat and no vomiting        Prior to Admission medications   Medication Sig Start Date End Date Taking? Authorizing Provider  acetaminophen  (TYLENOL ) 325 MG tablet Take 325 mg by mouth every 6  (six) hours as needed for mild pain or moderate pain.    [provider]  alendronate (FOSAMAX) 70 MG tablet Take 70 mg by mouth once a week. 05/06/23   [provider]  ALPRAZolam  (XANAX ) 0.5 MG tablet Take 0.5 mg by mouth See admin instructions. Take 1 tablet if needed daily and 2 tablets every evening 02/17/20   [provider]  aspirin  81 MG chewable tablet Chew 1 tablet (81 mg total) by mouth 2 (two) times daily AND 1 tablet (81 mg total) daily. 08/22/23 09/21/23  Franchot Novel, MD  donepezil  (ARICEPT ) 10 MG tablet Take 1 tablet (10 mg total) by mouth at bedtime. 06/10/23   Gayland Lauraine PARAS, NP  escitalopram  (LEXAPRO ) 20 MG tablet Take 20 mg by mouth daily.    [provider]  ferrous sulfate  325 (65 FE) MG tablet Take 1 tablet (325 mg total) by mouth daily with breakfast. 08/23/23   Franchot Novel, MD  gabapentin  (NEURONTIN ) 300 MG capsule Take 1 capsule by mouth daily. 07/27/23   [provider]  glipiZIDE (GLUCOTROL XL) 5 MG 24 hr tablet TAKE 1 TABLET BY MOUTH EVERY DAY FOR 90 DAYS 07/17/23   [provider]  hydrochlorothiazide  (MICROZIDE ) 12.5 MG capsule Take 1 capsule (12.5 mg total) by mouth daily. Patient not taking: Reported on 08/24/2023 12/04/21   Briana Elgin LABOR, MD  insulin  glargine (LANTUS ) 100  UNIT/ML Solostar Pen Inject 20 Units into the skin daily. 08/22/23   Franchot Novel, MD  levETIRAcetam  (KEPPRA ) 1000 MG tablet Take 1 tablet (1,000 mg total) by mouth 2 (two) times daily. 06/04/23 05/29/24  Gayland Lauraine PARAS, NP  losartan  (COZAAR ) 50 MG tablet Take 1 tablet (50 mg total) by mouth daily. 12/04/21   Briana Elgin LABOR, MD  Melatonin 10 MG TABS Take 2 tablets by mouth at bedtime.    [provider]  mirtazapine (REMERON) 7.5 MG tablet Take 1 tablet every day by oral route for 30 days. 07/30/23   [provider]  nicotine  (NICODERM CQ  - DOSED IN MG/24 HOURS) 14 mg/24hr patch Place 1 patch (14 mg total) onto the skin daily.  08/23/23   Franchot Novel, MD  nicotine  polacrilex (NICOTINE  MINI) 4 MG lozenge Take 1 lozenge (4 mg total) by mouth as needed for smoking cessation. 08/22/23   Franchot Novel, MD  NOVOLOG  FLEXPEN 100 UNIT/ML FlexPen Please use the hospital sliding scale provided for you Patient taking differently: Please use the hospital sliding scale provided for you take with each meal 08/25/15   Vonzell Loving, MD  oxyCODONE  (OXY IR/ROXICODONE ) 5 MG immediate release tablet Take 1 tablet (5 mg total) by mouth every 6 (six) hours as needed for severe pain (pain score 7-10). 08/22/23   Franchot Novel, MD  polyethylene glycol (MIRALAX ) 17 g packet Take 17 g by mouth daily. 08/22/23   Franchot Novel, MD  potassium chloride  (KLOR-CON ) 10 MEQ tablet Take 10 mEq by mouth daily. 06/18/23   [provider]  rosuvastatin  (CRESTOR ) 20 MG tablet Take 20 mg by mouth daily. 09/19/21   [provider]  Semaglutide,0.25 or 0.5MG /DOS, (OZEMPIC, 0.25 OR 0.5 MG/DOSE,) 2 MG/3ML SOPN INJECT 0.25 MG INTO THE SKIN FOR 4 WEEKS, THEN INJECT 0.5 MG INTO THE SKIN ONCE WEEKLY. Patient not taking: Reported on 08/24/2023 07/30/23   [provider]  senna (SENOKOT) 8.6 MG TABS tablet Take 1 tablet (8.6 mg total) by mouth daily. 08/23/23   Franchot Novel, MD  varenicline  (CHANTIX ) 1 MG tablet Take 0.5 tablets (0.5 mg total) by mouth daily for 3 days, THEN 0.5 tablets (0.5 mg total) 2 (two) times daily for 4 days, THEN 1 tablet (1 mg total) 2 (two) times daily. 08/22/23 10/10/23  Franchot Novel, MD    Allergies: Demerol, Depakote  [divalproex  sodium], Dilaudid  [hydromorphone  hcl], Doxycycline , and Valproic acid     Review of Systems  Constitutional:  Negative for chills and fever.  HENT:  Negative for ear pain and sore throat.   Eyes:  Negative for pain and visual disturbance.  Respiratory:  Positive for shortness of breath. Negative for cough.   Cardiovascular:  Negative for chest pain and palpitations.   Gastrointestinal:  Negative for abdominal pain and vomiting.  Genitourinary:  Negative for dysuria and hematuria.  Musculoskeletal:  Negative for arthralgias and back pain.  Skin:  Negative for color change and rash.  Neurological:  Negative for seizures and syncope.  Psychiatric/Behavioral:  Positive for confusion.   All other systems reviewed and are negative.   Updated Vital Signs BP (!) 138/49   Pulse 90   Temp 98.9 F (37.2 C) (Oral)   Resp 17   Ht 5' 3 (1.6 m)   Wt 58 kg   SpO2 90%   BMI 22.65 kg/m   Physical Exam Vitals and nursing note reviewed.  Constitutional:      General: She is not in acute distress.    Appearance:  She is well-developed.  HENT:     Head: Normocephalic and atraumatic.   Eyes:     Conjunctiva/sclera: Conjunctivae normal.    Cardiovascular:     Rate and Rhythm: Normal rate and regular rhythm.     Heart sounds: No murmur heard. Pulmonary:     Effort: Pulmonary effort is normal. No respiratory distress.     Breath sounds: Normal breath sounds.  Abdominal:     Palpations: Abdomen is soft.     Tenderness: There is no abdominal tenderness.   Musculoskeletal:        General: No swelling.     Right shoulder: Normal.     Left shoulder: Normal.     Right upper arm: Normal.     Left upper arm: Normal.     Right elbow: Normal.     Left elbow: Normal.     Right forearm: Normal.     Left forearm: Normal.     Right wrist: Normal.     Left wrist: Tenderness present. No swelling or deformity. Normal pulse.     Right hand: Normal.     Left hand: Normal.     Cervical back: Neck supple. No bony tenderness.     Thoracic back: No bony tenderness.     Lumbar back: No bony tenderness.     Right hip: Normal.     Left hip: Normal.     Right upper leg: Normal.     Left upper leg: Normal.     Right knee: Normal.     Left knee: Normal.     Right lower leg: Normal.     Left lower leg: Normal.     Right ankle: Normal.     Left ankle: Normal.      Right foot: Normal.     Left foot: Normal.       Legs:   Skin:    General: Skin is warm and dry.     Capillary Refill: Capillary refill takes less than 2 seconds.   Neurological:     Mental Status: She is confused.     GCS: GCS eye subscore is 4. GCS verbal subscore is 4. GCS motor subscore is 6.     Cranial Nerves: Cranial nerves 2-12 are intact.     Sensory: Sensation is intact.     Motor: Motor function is intact.   Psychiatric:        Mood and Affect: Mood normal.    (all labs ordered are listed, but only abnormal results are displayed) Labs Reviewed  BASIC METABOLIC PANEL WITH GFR - Abnormal; Notable for the following components:      Result Value   Chloride 96 (*)    Glucose, Bld 278 (*)    All other components within normal limits  CBC - Abnormal; Notable for the following components:   RDW 16.5 (*)    All other components within normal limits  URINALYSIS, ROUTINE W REFLEX MICROSCOPIC - Abnormal; Notable for the following components:   Nitrite POSITIVE (*)    All other components within normal limits  HEPATIC FUNCTION PANEL - Abnormal; Notable for the following components:   Albumin 3.1 (*)    All other components within normal limits  BLOOD GAS, VENOUS - Abnormal; Notable for the following components:   Bicarbonate 30.9 (*)    Acid-Base Excess 4.7 (*)    All other components within normal limits  LIPASE, BLOOD  LEVETIRACETAM  LEVEL  CBG MONITORING, ED    EKG: None  Radiology: CT Angio Chest PE W and/or Wo Contrast Result Date: 08/31/2023 CLINICAL DATA:  Clemens in bathtub short of breath EXAM: CT ANGIOGRAPHY CHEST WITH CONTRAST TECHNIQUE: Multidetector CT imaging of the chest was performed using the standard protocol during bolus administration of intravenous contrast. Multiplanar CT image reconstructions and MIPs were obtained to evaluate the vascular anatomy. RADIATION DOSE REDUCTION: This exam was performed according to the departmental dose-optimization  program which includes automated exposure control, adjustment of the mA and/or kV according to patient size and/or use of iterative reconstruction technique. CONTRAST:  75mL OMNIPAQUE  IOHEXOL  350 MG/ML SOLN COMPARISON:  Chest x-ray 08/31/2023, chest CT 08/19/2023 FINDINGS: Cardiovascular: Satisfactory opacification of the pulmonary arteries to the segmental level. No evidence of pulmonary embolism. Mild cardiomegaly. No pericardial effusion. Mild aortic atherosclerosis. No aneurysm or dissection. Coronary vascular calcification. Mediastinum/Nodes: No enlarged mediastinal, hilar, or axillary lymph nodes. Thyroid  gland, trachea, and esophagus demonstrate no significant findings. Lungs/Pleura: Atelectasis versus minimal infiltrate left lung base. Patchy mucous plugging in left lower lobe. Upper Abdomen: No acute finding Musculoskeletal: No acute osseous abnormality. Review of the MIP images confirms the above findings. IMPRESSION: 1. Negative for acute pulmonary embolus or aortic dissection. 2. Mild cardiomegaly. 3. Atelectasis versus minimal infiltrate in the left lung base. Patchy mucous plugging in the left lower lobe. 4. Aortic atherosclerosis. Aortic Atherosclerosis (ICD10-I70.0). Electronically Signed   By: Luke Bun M.D.   On: 08/31/2023 18:33   MR BRAIN WO CONTRAST Result Date: 08/31/2023 CLINICAL DATA:  Delirium. EXAM: MRI HEAD WITHOUT CONTRAST TECHNIQUE: Multiplanar, multiecho pulse sequences of the brain and surrounding structures were obtained without intravenous contrast. COMPARISON:  CT of the head dated August 31, 2023 and MRI of the brain dated March 16, 2023. FINDINGS: Brain: There is moderate cerebral volume loss present. There are confluent zones of increased T2 signal present within the periventricular white matter, particularly within the frontal lobes bilaterally. There is also moderate subcortical white matter disease. There is no evidence of restricted diffusion to indicate acute or  recent infarction. There is no evidence of hemorrhage, mass or hydrocephalus. Vascular: Normal vascular flow voids. Skull and upper cervical spine: Intact and normal in signal intensity. No lesions are evident. Sinuses/Orbits: Unremarkable. Other: None. IMPRESSION: 1. Age-related atrophy and moderately advanced cerebral white matter disease, particularly involving the periventricular white matter. No apparent acute process. Electronically Signed   By: Evalene Coho M.D.   On: 08/31/2023 16:32   DG Pelvis Portable Result Date: 08/31/2023 CLINICAL DATA:  Clemens last night, recent hip replacement EXAM: PORTABLE PELVIS 1-2 VIEWS COMPARISON:  08/20/2023 FINDINGS: Frontal view of the pelvis includes both hips. Right hip hemiarthroplasty is again identified in the expected position without evidence of complication. The distal margin of the femoral component is excluded by collimation. Stable mild left hip osteoarthritis. No acute displaced fractures. The sacroiliac joints are normal. Skin staples overlie the right hip. IMPRESSION: 1. No acute displaced fracture. 2. Stable right hip hemiarthroplasty, without evidence of acute complication. Distal margin of the femoral component is excluded by collimation. Electronically Signed   By: Ozell Daring M.D.   On: 08/31/2023 15:25   DG Wrist Complete Left Result Date: 08/31/2023 CLINICAL DATA:  Clemens, left wrist pain EXAM: LEFT WRIST - COMPLETE 3+ VIEW COMPARISON:  None Available. FINDINGS: Frontal, oblique, lateral, and ulnar deviated views of the left wrist are obtained. Bones are diffusely osteopenic. There is a minimally displaced fracture at the tip of the ulnar styloid, with near anatomic alignment. No other  acute bony abnormalities. Mild osteoarthritis at the radiocarpal joint and within the radial aspect of the carpus. Dorsal soft tissue swelling. IMPRESSION: 1. Small minimally displaced fracture at the tip of the ulnar styloid. 2. Dorsal soft tissue swelling. 3.  Diffuse osteopenia. 4. Mild osteoarthritis. Electronically Signed   By: Ozell Daring M.D.   On: 08/31/2023 15:24   DG Chest 1 View Result Date: 08/31/2023 CLINICAL DATA:  Clemens last night, hypoxia EXAM: CHEST  1 VIEW COMPARISON:  08/18/2023 FINDINGS: Single frontal view of the chest demonstrates a stable cardiac silhouette. No airspace disease, effusion, or pneumothorax. No acute displaced fractures. IMPRESSION: 1. No acute intrathoracic process. Electronically Signed   By: Ozell Daring M.D.   On: 08/31/2023 15:23   CT Head Wo Contrast Result Date: 08/31/2023 CLINICAL DATA:  Clemens last night, short of breath, hypoxia EXAM: CT HEAD WITHOUT CONTRAST TECHNIQUE: Contiguous axial images were obtained from the base of the skull through the vertex without intravenous contrast. RADIATION DOSE REDUCTION: This exam was performed according to the departmental dose-optimization program which includes automated exposure control, adjustment of the mA and/or kV according to patient size and/or use of iterative reconstruction technique. COMPARISON:  03/10/2020 FINDINGS: Brain: Confluent hypodensities throughout the periventricular white matter, as well as focal hypodensities within the right side of the pons and bilateral basal ganglia, compatible with chronic small vessel ischemic changes. No signs of acute infarct or hemorrhage. Lateral ventricles and remaining midline structures are unremarkable. No acute extra-axial fluid collections. No mass effect. Vascular: No hyperdense vessel or unexpected calcification. Skull: Normal. Negative for fracture or focal lesion. Sinuses/Orbits: No acute finding. Other: None. IMPRESSION: 1. No acute intracranial process. Electronically Signed   By: Ozell Daring M.D.   On: 08/31/2023 15:22     .Critical Care  Performed by: Elnor Bernarda SQUIBB, DO Authorized by: Elnor Bernarda SQUIBB, DO   Critical care provider statement:    Critical care time (minutes):  103   Critical care was necessary  to treat or prevent imminent or life-threatening deterioration of the following conditions:  Respiratory failure   Critical care was time spent personally by me on the following activities:  Development of treatment plan with patient or surrogate, discussions with consultants, evaluation of patient's response to treatment, examination of patient, ordering and review of laboratory studies, ordering and review of radiographic studies, ordering and performing treatments and interventions, pulse oximetry, re-evaluation of patient's condition and review of old charts   Care discussed with: admitting provider      Medications Ordered in the ED  ipratropium-albuterol  (DUONEB) 0.5-2.5 (3) MG/3ML nebulizer solution 3 mL (has no administration in time range)  morphine  (PF) 4 MG/ML injection 4 mg (4 mg Intravenous Given 08/31/23 1602)  0.9 %  sodium chloride  infusion ( Intravenous New Bag/Given 08/31/23 1608)  iohexol  (OMNIPAQUE ) 350 MG/ML injection 75 mL (75 mLs Intravenous Contrast Given 08/31/23 1746)                                    Medical Decision Making Amount and/or Complexity of Data Reviewed Labs: ordered. Radiology: ordered.  Risk Prescription drug management. Decision regarding hospitalization.   Patient is a 68 year old female with past medical history of diabetes, hypertension, stroke in 2016, seizure disorder on Keppra , and dementia presenting for fall, hypoxia, and confusion.  Patient is alert and oriented x 3, no acute distress, afebrile, stable vital signs.  Physical exam demonstrates  bony tenderness to the left ulna.  No gross deformities.  No other bony tenderness or spinal tenderness on exam.  X-ray of left ulna demonstrates distal ulnar fracture with mild displacement.  Ulnar gutter placed.  Recommend follow-up with orthopedics on an outpatient basis.  CT head demonstrates no acute process.  History of recent hip arthroplasty.  X-ray of pelvis demonstrates no acute  process.  Was found to be hypoxic with wheezing in all lung fields with a likely COPD exacerbation.  She was given a DuoNeb treatment as well as Solu-Medrol  and route.  Repeat DuoNeb treatment given in ED for wheezing.  Chest x-ray demonstrates no acute process.  CT PE demonstrates no pulmonary embolism.  Positive for mucous plugging only.  MRI was ordered to rule out stroke due to confusion and was normal.  Patient at baseline currently.  Altered mental status likely secondary to encephalopathy and hypoxia secondary to COPD exacerbation.  Recommend admission at this time.  Patient currently on 6 L nasal cannula satting at 90%.        Final diagnoses:  Hypoxia  Closed fracture of distal end of left ulna, unspecified fracture morphology, initial encounter  Confusion  COPD exacerbation (HCC)  Fall, initial encounter  Blunt head trauma, initial encounter    ED Discharge Orders     None          Elnor Bernarda SQUIBB, DO 09/08/23 1941

## 2023-08-31 NOTE — Telephone Encounter (Signed)
 Dr. Ernesta Heading pt - Wyn Heater PT w/[facility] [phone] lvm stating he is a little concerned, he stated she has an appointment tomorrow and they plan to keep that appt, but he thinks she really needs to seek ED care today, her O2 stats are 85%, she's a little more lethargic, a fall this morning w/a new onset of wrist pain.  He encouraged her to go the emergency room, but he doesn't think she will follow up w/those recommendations.

## 2023-08-31 NOTE — ED Notes (Signed)
Pt off the unit for testing.

## 2023-08-31 NOTE — ED Triage Notes (Addendum)
 Pt bib [facility] for a fall. Pt fell in her bathtub last night. C/o of pain to right wrist. Pt also c/o of feeling sob. O2 sats 83% on RA.  Pt received 125mg  solumedrol IM & 2 albuterol  txs from EMS>

## 2023-08-31 NOTE — H&P (Signed)
 History and Physical    Patient: Veronica Moses ZOX:[id number] DOB: Nov 19, 1955 DOA: 08/31/2023 DOS: the patient was seen and examined on 09/01/2023 PCP: Wendi Ham, NP  Patient coming from: Home  Chief Complaint:  Chief Complaint  Patient presents with   Shortness of Breath   HPI: Veronica Moses is a 68 y.o. female with medical history significant of  COPD, diabetes mellitus, seizure disorder, tobacco abuse who presents to the emergency department due to fall, shortness of breath and confusion.  Patient was unable to provide history, history was obtained from ED PA and ED medical record.  Per report, patient fell when she went to use the spare bathroom at home.  Husband heard a loud thud and quickly went to the bathroom and found patient on the floor.  She admitted to blunt head trauma and left wrist pain.  She states that she was going to the bathroom, this was unusual, since she usually uses the bathroom attached to her bedroom.  She also complains of recent unproductive cough, but she denies fever, chills, nausea or vomiting.  O2 sat at physical therapy today was noted to be in the 80s.  EMS was activated and patient was sent to the ED for further evaluation. In the ED, patient was unable to identify daughter which was a change from baseline dementia.  She was recently admitted from 6/30 through 6/7 due to right hip fracture (displaced femoral neck fracture) s/p right hip hemiarthroplasty.  ED Course:  In the emergency department, respiratory rate was 22/min, BP 121/55, O2 sat was 91% on supplemental oxygen  via [place] at 4 LPM.  Other vital signs were within normal range.  Workup in the ED showed normal CBC and BMP with chloride of 96 and blood glucose of 278.  Albumin 3.1, ABG was normal except for bicarb of 30.9, urinalysis positive for nitrite. CT angiography chest with contrast was negative for acute pulmonary embolus or aortic dissection. Atelectasis versus minimal infiltrate in the left lung  base. Patchy mucous plugging in the left lower lobe. MRI head without contrast showed no apparent acute process Left wrist x-ray showed small  minimally displaced fracture at the tip of the ulnar styloid. Breathing treatment was provided, morphine  was given, IV hydration was provided.  TRH was asked to admit patient  Review of Systems: Review of systems as noted in the HPI. All other systems reviewed and are negative.   Past Medical History:  Diagnosis Date   Anxiety    Closed fracture of right distal radius    COPD (chronic obstructive pulmonary disease) (HCC)    Depression    Diabetes mellitus    Insulin  Dependent. Adult onset   Dyspnea    with exertion   Heart murmur    Hypertension    Memory changes    since being on vent June 2017- coming back some.   Seizures (HCC) 08/2015   last seizure 07-2019   Stroke [facility]) 2016   remote, no deficits   UTI (lower urinary tract infection)    Past Surgical History:  Procedure Laterality Date   ABDOMINAL HYSTERECTOMY     BREAST SURGERY Left    cyst-   CHOLECYSTECTOMY     COLONOSCOPY WITH ESOPHAGOGASTRODUODENOSCOPY (EGD) N/A 03/04/2013   Procedure: COLONOSCOPY WITH ESOPHAGOGASTRODUODENOSCOPY (EGD);  Surgeon: Ruby Corporal, MD;  Location: [facility];  Service: Endoscopy;  Laterality: N/A;  1200-moved to 1535 Ann notified pt   HEMIARTHROPLASTY (BIPOLAR), HIP, DIRECT ANTERIOR APPROACH, FOR FRACTURE Right 08/20/2023  Procedure: HEMIARTHROPLASTY (BIPOLAR), HIP, DIRECT ANTERIOR APPROACH, FOR FRACTURE;  Surgeon: Tonita Frater, MD;  Location: [facility] ORS;  Service: Orthopedics;  Laterality: Right;   MULTIPLE EXTRACTIONS WITH ALVEOLOPLASTY N/A 05/30/2016   Procedure: MULTIPLE EXTRACTION WITH ALVEOLOPLASTY;  Surgeon: Ascencion Lava, DDS;  Location: [facility] OR;  Service: Oral Surgery;  Laterality: N/A;   NEPHRECTOMY Left    Donatated to brother   OPEN REDUCTION INTERNAL FIXATION (ORIF) DISTAL RADIAL FRACTURE Right 03/22/2020   Procedure: OPEN REDUCTION  INTERNAL FIXATION (ORIF) DISTAL RADIAL FRACTURE;  Surgeon: Lyanne Sample, MD;  Location: [facility];  Service: Orthopedics;  Laterality: Right;  AXILLARY BLOCK    Social History:  reports that she has been smoking cigarettes. She started smoking about 34 years ago. She has a 26 pack-year smoking history. She has never used smokeless tobacco. She reports that she does not drink alcohol and does not use drugs.   Allergies  Allergen Reactions   Demerol Other (See Comments)    hallucinations   Depakote  [Divalproex  Sodium] Other (See Comments)    Sore/blisters   Dilaudid  [Hydromorphone  Hcl] Nausea And Vomiting   Doxycycline  Other (See Comments)    Blistered   Valproic Acid  Rash    Family History  Problem Relation Age of Onset   Diabetes Father    CAD Other        family history   Diabetes Brother    Colon cancer Neg Hx      Prior to Admission medications   Medication Sig Start Date End Date Taking? Authorizing Provider  acetaminophen  (TYLENOL ) 325 MG tablet Take 325 mg by mouth every 6 (six) hours as needed for mild pain or moderate pain.   Yes [provider]  alendronate (FOSAMAX) 70 MG tablet Take 70 mg by mouth every Saturday. 05/06/23  Yes [provider]  ALPRAZolam  (XANAX ) 0.5 MG tablet Take 0.5 mg by mouth See admin instructions. Take 1 tablet if needed daily and 2 tablets every evening 02/17/20  Yes [provider]  aspirin  81 MG chewable tablet Chew 1 tablet (81 mg total) by mouth 2 (two) times daily AND 1 tablet (81 mg total) daily. 08/22/23 09/21/23 Yes Joette Mustard, MD  donepezil  (ARICEPT ) 10 MG tablet Take 1 tablet (10 mg total) by mouth at bedtime. 06/10/23  Yes Wess Hammed, NP  escitalopram  (LEXAPRO ) 20 MG tablet Take 20 mg by mouth daily.   Yes [provider]  ferrous sulfate  325 (65 FE) MG tablet Take 1 tablet (325 mg total) by mouth daily with breakfast. 08/23/23  Yes Joette Mustard, MD  gabapentin  (NEURONTIN ) 300  MG capsule Take 1 capsule by mouth daily. 07/27/23  Yes [provider]  glipiZIDE (GLUCOTROL XL) 5 MG 24 hr tablet Take 5 mg by mouth daily with breakfast. 07/17/23  Yes [provider]  insulin  glargine (LANTUS ) 100 UNIT/ML Solostar Pen Inject 20 Units into the skin daily. 08/22/23  Yes Joette Mustard, MD  levETIRAcetam  (KEPPRA ) 1000 MG tablet Take 1 tablet (1,000 mg total) by mouth 2 (two) times daily. 06/04/23 05/29/24 Yes Wess Hammed, NP  losartan  (COZAAR ) 50 MG tablet Take 1 tablet (50 mg total) by mouth daily. 12/04/21  Yes Verlyn Goad, MD  mirtazapine (REMERON) 7.5 MG tablet Take 7.5 mg by mouth at bedtime. 07/30/23  Yes [provider]  NOVOLOG  FLEXPEN 100 UNIT/ML FlexPen Please use the hospital sliding scale provided for you Patient taking differently: Inject 5 Units into the skin daily as needed for  high blood sugar. Please use the hospital sliding scale provided for you take with each meal for levels over 240 08/25/15  Yes Mariea Shook, MD  oxyCODONE  (OXY IR/ROXICODONE ) 5 MG immediate release tablet Take 1 tablet (5 mg total) by mouth every 6 (six) hours as needed for severe pain (pain score 7-10). 08/22/23  Yes Joette Mustard, MD  polyethylene glycol (MIRALAX ) 17 g packet Take 17 g by mouth daily. Patient taking differently: Take 17 g by mouth daily as needed for mild constipation or moderate constipation. 08/22/23  Yes Joette Mustard, MD  potassium chloride  (KLOR-CON ) 10 MEQ tablet Take 10 mEq by mouth daily. 06/18/23  Yes [provider]  rosuvastatin  (CRESTOR ) 20 MG tablet Take 20 mg by mouth daily. 09/19/21  Yes [provider]  senna (SENOKOT) 8.6 MG TABS tablet Take 1 tablet (8.6 mg total) by mouth daily. Patient taking differently: Take 1 tablet by mouth daily as needed for mild constipation or moderate constipation. 08/23/23  Yes Joette Mustard, MD  varenicline  (CHANTIX ) 1 MG tablet Take 0.5 tablets (0.5 mg total) by mouth daily for 3  days, THEN 0.5 tablets (0.5 mg total) 2 (two) times daily for 4 days, THEN 1 tablet (1 mg total) 2 (two) times daily. 08/22/23 10/10/23 Yes Joette Mustard, MD    Physical Exam: BP (!) 160/60 (BP Location: Right Arm)   Pulse 78   Temp 98.2 F (36.8 C) (Oral)   Resp 20   Ht 5' 1 (1.549 m)   Wt 59.1 kg   SpO2 97%   BMI 24.62 kg/m   General: 68 y.o. year-old female well developed well nourished in no acute distress.   HEENT: NCAT, EOMI Neck: Supple, trachea medial Cardiovascular: Regular rate and rhythm with no rubs or gallops.  No thyromegaly or JVD noted.  No lower extremity edema. 2/4 pulses in all 4 extremities. Respiratory: Clear to auscultation with no wheezes or rales. Good inspiratory effort. Abdomen: Soft, nontender nondistended with normal bowel sounds x4 quadrants. Muskuloskeletal: Left wrist tenderness.  No cyanosis, clubbing or edema noted bilaterally Neuro: Patient was confused.  sensation, reflexes intact Skin: No ulcerative lesions noted or rashes Psychiatry: Mood is appropriate for condition and setting          Labs on Admission:  Basic Metabolic Panel: Recent Labs  Lab 08/31/23 1341 09/01/23 0139  NA 138 135  K 3.5 3.9  CL 96* 95*  CO2 31 26  GLUCOSE 278* 533*  BUN 11 16  CREATININE 0.93 0.96  CALCIUM  9.8 9.1  MG  --  1.3*  PHOS  --  4.7*   Liver Function Tests: Recent Labs  Lab 08/31/23 1341 09/01/23 0139  AST 37 32  ALT 28 25  ALKPHOS 39 35*  BILITOT 0.3 0.6  PROT 6.7 6.1*  ALBUMIN 3.1* 2.8*   Recent Labs  Lab 08/31/23 1341  LIPASE 21   No results for input(s): AMMONIA in the last 168 hours. CBC: Recent Labs  Lab 08/31/23 1341 09/01/23 0139  WBC 10.4 6.5  HGB 12.1 10.4*  HCT 38.5 31.7*  MCV 96.0 94.3  PLT 303 278   Cardiac Enzymes: No results for input(s): CKTOTAL, CKMB, CKMBINDEX, TROPONINI in the last 168 hours.  BNP (last 3 results) No results for input(s): BNP in the last 8760 hours.  ProBNP (last 3  results) No results for input(s): PROBNP in the last 8760 hours.  CBG: Recent Labs  Lab 08/31/23 2343 09/01/23 0103 09/01/23 0407  GLUCAP 518* 505* 452*  Radiological Exams on Admission: CT Angio Chest PE W and/or Wo Contrast Result Date: 08/31/2023 CLINICAL DATA:  Marvell Slider in bathtub short of breath EXAM: CT ANGIOGRAPHY CHEST WITH CONTRAST TECHNIQUE: Multidetector CT imaging of the chest was performed using the standard protocol during bolus administration of intravenous contrast. Multiplanar CT image reconstructions and MIPs were obtained to evaluate the vascular anatomy. RADIATION DOSE REDUCTION: This exam was performed according to the departmental dose-optimization program which includes automated exposure control, adjustment of the mA and/or kV according to patient size and/or use of iterative reconstruction technique. CONTRAST:  75mL OMNIPAQUE  IOHEXOL  350 MG/ML SOLN COMPARISON:  Chest x-ray 08/31/2023, chest CT 08/19/2023 FINDINGS: Cardiovascular: Satisfactory opacification of the pulmonary arteries to the segmental level. No evidence of pulmonary embolism. Mild cardiomegaly. No pericardial effusion. Mild aortic atherosclerosis. No aneurysm or dissection. Coronary vascular calcification. Mediastinum/Nodes: No enlarged mediastinal, hilar, or axillary lymph nodes. Thyroid  gland, trachea, and esophagus demonstrate no significant findings. Lungs/Pleura: Atelectasis versus minimal infiltrate left lung base. Patchy mucous plugging in left lower lobe. Upper Abdomen: No acute finding Musculoskeletal: No acute osseous abnormality. Review of the MIP images confirms the above findings. IMPRESSION: 1. Negative for acute pulmonary embolus or aortic dissection. 2. Mild cardiomegaly. 3. Atelectasis versus minimal infiltrate in the left lung base. Patchy mucous plugging in the left lower lobe. 4. Aortic atherosclerosis. Aortic Atherosclerosis (ICD10-I70.0). Electronically Signed   By: Esmeralda Hedge M.D.   On:  08/31/2023 18:33   MR BRAIN WO CONTRAST Result Date: 08/31/2023 CLINICAL DATA:  Delirium. EXAM: MRI HEAD WITHOUT CONTRAST TECHNIQUE: Multiplanar, multiecho pulse sequences of the brain and surrounding structures were obtained without intravenous contrast. COMPARISON:  CT of the head dated August 31, 2023 and MRI of the brain dated March 16, 2023. FINDINGS: Brain: There is moderate cerebral volume loss present. There are confluent zones of increased T2 signal present within the periventricular white matter, particularly within the frontal lobes bilaterally. There is also moderate subcortical white matter disease. There is no evidence of restricted diffusion to indicate acute or recent infarction. There is no evidence of hemorrhage, mass or hydrocephalus. Vascular: Normal vascular flow voids. Skull and upper cervical spine: Intact and normal in signal intensity. No lesions are evident. Sinuses/Orbits: Unremarkable. Other: None. IMPRESSION: 1. Age-related atrophy and moderately advanced cerebral white matter disease, particularly involving the periventricular white matter. No apparent acute process. Electronically Signed   By: Maribeth Shivers M.D.   On: 08/31/2023 16:32   DG Pelvis Portable Result Date: 08/31/2023 CLINICAL DATA:  Marvell Slider last night, recent hip replacement EXAM: PORTABLE PELVIS 1-2 VIEWS COMPARISON:  08/20/2023 FINDINGS: Frontal view of the pelvis includes both hips. Right hip hemiarthroplasty is again identified in the expected position without evidence of complication. The distal margin of the femoral component is excluded by collimation. Stable mild left hip osteoarthritis. No acute displaced fractures. The sacroiliac joints are normal. Skin staples overlie the right hip. IMPRESSION: 1. No acute displaced fracture. 2. Stable right hip hemiarthroplasty, without evidence of acute complication. Distal margin of the femoral component is excluded by collimation. Electronically Signed   By: Bobbye Burrow M.D.   On: 08/31/2023 15:25   DG Wrist Complete Left Result Date: 08/31/2023 CLINICAL DATA:  Marvell Slider, left wrist pain EXAM: LEFT WRIST - COMPLETE 3+ VIEW COMPARISON:  None Available. FINDINGS: Frontal, oblique, lateral, and ulnar deviated views of the left wrist are obtained. Bones are diffusely osteopenic. There is a minimally displaced fracture at the tip of the ulnar styloid, with near anatomic  alignment. No other acute bony abnormalities. Mild osteoarthritis at the radiocarpal joint and within the radial aspect of the carpus. Dorsal soft tissue swelling. IMPRESSION: 1. Small minimally displaced fracture at the tip of the ulnar styloid. 2. Dorsal soft tissue swelling. 3. Diffuse osteopenia. 4. Mild osteoarthritis. Electronically Signed   By: Bobbye Burrow M.D.   On: 08/31/2023 15:24   DG Chest 1 View Result Date: 08/31/2023 CLINICAL DATA:  Marvell Slider last night, hypoxia EXAM: CHEST  1 VIEW COMPARISON:  08/18/2023 FINDINGS: Single frontal view of the chest demonstrates a stable cardiac silhouette. No airspace disease, effusion, or pneumothorax. No acute displaced fractures. IMPRESSION: 1. No acute intrathoracic process. Electronically Signed   By: Bobbye Burrow M.D.   On: 08/31/2023 15:23   CT Head Wo Contrast Result Date: 08/31/2023 CLINICAL DATA:  Marvell Slider last night, short of breath, hypoxia EXAM: CT HEAD WITHOUT CONTRAST TECHNIQUE: Contiguous axial images were obtained from the base of the skull through the vertex without intravenous contrast. RADIATION DOSE REDUCTION: This exam was performed according to the departmental dose-optimization program which includes automated exposure control, adjustment of the mA and/or kV according to patient size and/or use of iterative reconstruction technique. COMPARISON:  03/10/2020 FINDINGS: Brain: Confluent hypodensities throughout the periventricular white matter, as well as focal hypodensities within the right side of the pons and bilateral basal ganglia, compatible  with chronic small vessel ischemic changes. No signs of acute infarct or hemorrhage. Lateral ventricles and remaining midline structures are unremarkable. No acute extra-axial fluid collections. No mass effect. Vascular: No hyperdense vessel or unexpected calcification. Skull: Normal. Negative for fracture or focal lesion. Sinuses/Orbits: No acute finding. Other: None. IMPRESSION: 1. No acute intracranial process. Electronically Signed   By: Bobbye Burrow M.D.   On: 08/31/2023 15:22    EKG: I independently viewed the EKG done and my findings are as followed: Normal sinus rhythm at a rate of 90 bpm  Assessment/Plan Present on Admission:  Acute respiratory failure with hypoxia (HCC)  Acute exacerbation of chronic obstructive pulmonary disease (COPD) (HCC)  Type 2 diabetes mellitus with hyperglycemia (HCC)  Essential hypertension  Hypoalbuminemia due to protein-calorie malnutrition (HCC)  Insomnia  Mixed hyperlipidemia  Principal Problem:   Acute respiratory failure with hypoxia (HCC) Active Problems:   Acute exacerbation of chronic obstructive pulmonary disease (COPD) (HCC)   Fall at home, initial encounter   Seizure (HCC)   Hypoalbuminemia due to protein-calorie malnutrition (HCC)   Essential hypertension   Type 2 diabetes mellitus with hyperglycemia (HCC)   Insomnia   Mixed hyperlipidemia   Displaced fracture of left ulna styloid process, initial encounter for closed fracture  Acute respiratory failure with hypoxemia secondary to COPD exacerbation Acute metabolic encephalopathy possibly secondary to above Continue duo nebs, Mucinex , Solu-Medrol , azithromycin. Continue Protonix  to prevent steroid-induced ulcer Continue incentive spirometry and flutter valve Continue supplemental oxygen  to maintain O2 sat > 94% with plan to wean patient off oxygen  as tolerated Continue fall precaution, delirium precautions, aspiration precaution  Fracture of left ulna styloid Left wrist x-ray  showed small  minimally displaced fracture at the tip of the ulnar styloid. Continue home IV morphine  2 mg every 4 hours as needed Patient will be placed n.p.o. in anticipation for possible surgical procedure in the morning Orthopedic surgeon (Dr. Ernesta Heading) was consulted and will see patient in the morning  Fall at home Continue fall precaution Continue PT/OT eval and treat  Type 2 diabetes mellitus with hyperglycemia Hemoglobin A1c on 08/03/2023 close 8.9 Continue ISS and  hypoglycemia protocol  Essential hypertension Continue IV hydralazine  10 mg every 6 hours for SBP > 170 Continue losartan    Hypoalbuminemia possibly secondary to mild protein calorie malnutrition Albumin 3.1, protein supplement will be provided  Seizure Continue Keppra    Insomnia Continue home Xanax    Mixed hyperlipidemia Continue Crestor    DVT prophylaxis: SCDs (consider starting patient on chemoprophylaxis if no indication for any surgical intervention)   Code Status: Full code   Family Communication: None at bedside   Consults: Orthopedic surgeon (Dr. Ernesta Heading)  Consults: Orthopedic surgery  Severity of Illness: The appropriate patient status for this patient is INPATIENT. Inpatient status is judged to be reasonable and necessary in order to provide the required intensity of service to ensure the patient's safety. The patient's presenting symptoms, physical exam findings, and initial radiographic and laboratory data in the context of their chronic comorbidities is felt to place them at high risk for further clinical deterioration. Furthermore, it is not anticipated that the patient will be medically stable for discharge from the hospital within 2 midnights of admission.   * I certify that at the point of admission it is my clinical judgment that the patient will require inpatient hospital care spanning beyond 2 midnights from the point of admission due to high intensity of service, high risk for further  deterioration and high frequency of surveillance required.*  Author: Medard Decuir, DO 09/01/2023 4:49 AM  For on call review [email or web address].

## 2023-09-01 ENCOUNTER — Encounter: Admitting: Orthopedic Surgery

## 2023-09-01 DIAGNOSIS — S52602A Unspecified fracture of lower end of left ulna, initial encounter for closed fracture: Secondary | ICD-10-CM | POA: Diagnosis not present

## 2023-09-01 DIAGNOSIS — J9601 Acute respiratory failure with hypoxia: Secondary | ICD-10-CM | POA: Diagnosis not present

## 2023-09-01 DIAGNOSIS — W19XXXA Unspecified fall, initial encounter: Secondary | ICD-10-CM | POA: Diagnosis not present

## 2023-09-01 LAB — CBC
HCT: 31.7 % — ABNORMAL LOW (ref 36.0–46.0)
Hemoglobin: 10.4 g/dL — ABNORMAL LOW (ref 12.0–15.0)
MCH: 31 pg (ref 26.0–34.0)
MCHC: 32.8 g/dL (ref 30.0–36.0)
MCV: 94.3 fL (ref 80.0–100.0)
Platelets: 278 10*3/uL (ref 150–400)
RBC: 3.36 MIL/uL — ABNORMAL LOW (ref 3.87–5.11)
RDW: 16 % — ABNORMAL HIGH (ref 11.5–15.5)
WBC: 6.5 10*3/uL (ref 4.0–10.5)
nRBC: 0 % (ref 0.0–0.2)

## 2023-09-01 LAB — COMPREHENSIVE METABOLIC PANEL WITH GFR
ALT: 25 U/L (ref 0–44)
AST: 32 U/L (ref 15–41)
Albumin: 2.8 g/dL — ABNORMAL LOW (ref 3.5–5.0)
Alkaline Phosphatase: 35 U/L — ABNORMAL LOW (ref 38–126)
Anion gap: 14 (ref 5–15)
BUN: 16 mg/dL (ref 8–23)
CO2: 26 mmol/L (ref 22–32)
Calcium: 9.1 mg/dL (ref 8.9–10.3)
Chloride: 95 mmol/L — ABNORMAL LOW (ref 98–111)
Creatinine, Ser: 0.96 mg/dL (ref 0.44–1.00)
GFR, Estimated: >60 mL/min (ref >60)
Glucose, Bld: 533 mg/dL (ref 70–99)
Potassium: 3.9 mmol/L (ref 3.5–5.1)
Sodium: 135 mmol/L (ref 135–145)
Total Bilirubin: 0.6 mg/dL (ref 0.0–1.2)
Total Protein: 6.1 g/dL — ABNORMAL LOW (ref 6.5–8.1)

## 2023-09-01 LAB — GLUCOSE, CAPILLARY
Glucose-Capillary: 505 mg/dL (ref 70–99)
Glucose-Capillary: 452 mg/dL — ABNORMAL HIGH (ref 70–99)
Glucose-Capillary: 434 mg/dL — ABNORMAL HIGH (ref 70–99)
Glucose-Capillary: 489 mg/dL — ABNORMAL HIGH (ref 70–99)
Glucose-Capillary: 344 mg/dL — ABNORMAL HIGH (ref 70–99)
Glucose-Capillary: 409 mg/dL — ABNORMAL HIGH (ref 70–99)
Glucose-Capillary: 285 mg/dL — ABNORMAL HIGH (ref 70–99)
Glucose-Capillary: 225 mg/dL — ABNORMAL HIGH (ref 70–99)
Glucose-Capillary: 208 mg/dL — ABNORMAL HIGH (ref 70–99)
Glucose-Capillary: 199 mg/dL — ABNORMAL HIGH (ref 70–99)
Glucose-Capillary: 166 mg/dL — ABNORMAL HIGH (ref 70–99)
Glucose-Capillary: 203 mg/dL — ABNORMAL HIGH (ref 70–99)
Glucose-Capillary: 198 mg/dL — ABNORMAL HIGH (ref 70–99)
Glucose-Capillary: 229 mg/dL — ABNORMAL HIGH (ref 70–99)
Glucose-Capillary: 168 mg/dL — ABNORMAL HIGH (ref 70–99)

## 2023-09-01 LAB — BLOOD GAS, ARTERIAL
Acid-Base Excess: 5.6 mmol/L — ABNORMAL HIGH (ref 0.0–2.0)
Bicarbonate: 30.6 mmol/L — ABNORMAL HIGH (ref 20.0–28.0)
Drawn by: 28340
O2 Saturation: 94.1 %
Patient temperature: 37
pCO2 arterial: 45 mmHg (ref 32–48)
pH, Arterial: 7.44 (ref 7.35–7.45)
pO2, Arterial: 71 mmHg — ABNORMAL LOW (ref 83–108)

## 2023-09-01 LAB — PHOSPHORUS: Phosphorus: 4.7 mg/dL — ABNORMAL HIGH (ref 2.5–4.6)

## 2023-09-01 LAB — BASIC METABOLIC PANEL WITH GFR
Anion gap: 10 (ref 5–15)
BUN: 18 mg/dL (ref 8–23)
CO2: 28 mmol/L (ref 22–32)
Calcium: 9 mg/dL (ref 8.9–10.3)
Chloride: 98 mmol/L (ref 98–111)
Creatinine, Ser: 0.9 mg/dL (ref 0.44–1.00)
GFR, Estimated: >60 mL/min (ref >60)
Glucose, Bld: 432 mg/dL — ABNORMAL HIGH (ref 70–99)
Potassium: 3.6 mmol/L (ref 3.5–5.1)
Sodium: 136 mmol/L (ref 135–145)

## 2023-09-01 LAB — MAGNESIUM: Magnesium: 1.3 mg/dL — ABNORMAL LOW (ref 1.7–2.4)

## 2023-09-01 LAB — LEVETIRACETAM LEVEL: Levetiracetam Lvl: 45.9 ug/mL — ABNORMAL HIGH (ref 10.0–40.0)

## 2023-09-01 MED ORDER — INSULIN REGULAR(HUMAN) IN NACL 100-0.9 UT/100ML-% IV SOLN
INTRAVENOUS | Status: DC
  Administered 2023-09-01: 11.5 [IU]/h via INTRAVENOUS
  Filled 2023-09-01 (×2): qty 100

## 2023-09-01 MED ORDER — BUDESONIDE 0.25 MG/2ML IN SUSP
0.2500 mg | Freq: Two times a day (BID) | RESPIRATORY_TRACT | Status: DC
  Administered 2023-09-01 – 2023-09-02 (×2): 0.25 mg via RESPIRATORY_TRACT
  Filled 2023-09-01 (×2): qty 2

## 2023-09-01 MED ORDER — DEXTROSE IN LACTATED RINGERS 5 % IV SOLN: INTRAVENOUS | Status: DC

## 2023-09-01 MED ORDER — ALPRAZOLAM 0.5 MG PO TABS
0.5000 mg | ORAL_TABLET | Freq: Every day | ORAL | Status: DC | PRN
  Administered 2023-09-01: 0.5 mg via ORAL
  Filled 2023-09-01: qty 1

## 2023-09-01 MED ORDER — INSULIN ASPART 100 UNIT/ML IJ SOLN
0.0000 [IU] | Freq: Three times a day (TID) | INTRAMUSCULAR | Status: DC
  Administered 2023-09-01: 7 [IU] via SUBCUTANEOUS
  Administered 2023-09-02: 11 [IU] via SUBCUTANEOUS

## 2023-09-01 MED ORDER — PREDNISONE 20 MG PO TABS
40.0000 mg | ORAL_TABLET | Freq: Every day | ORAL | Status: DC
  Administered 2023-09-02: 40 mg via ORAL
  Filled 2023-09-01: qty 2

## 2023-09-01 MED ORDER — INSULIN ASPART 100 UNIT/ML IJ SOLN
10.0000 [IU] | Freq: Once | INTRAMUSCULAR | Status: AC
  Administered 2023-09-01: 10 [IU] via SUBCUTANEOUS

## 2023-09-01 MED ORDER — SODIUM CHLORIDE 0.9 % IV SOLN
1.0000 g | INTRAVENOUS | Status: DC
  Administered 2023-09-01: 1 g via INTRAVENOUS
  Filled 2023-09-01: qty 10

## 2023-09-01 MED ORDER — CHLORHEXIDINE GLUCONATE CLOTH 2 % EX PADS
6.0000 | MEDICATED_PAD | Freq: Every day | CUTANEOUS | Status: DC
  Administered 2023-09-01 – 2023-09-02 (×2): 6 via TOPICAL

## 2023-09-01 MED ORDER — INSULIN ASPART 100 UNIT/ML IJ SOLN: 0.0000 [IU] | Freq: Every day | INTRAMUSCULAR | Status: DC

## 2023-09-01 MED ORDER — INSULIN GLARGINE-YFGN 100 UNIT/ML ~~LOC~~ SOLN
20.0000 [IU] | Freq: Every day | SUBCUTANEOUS | Status: DC
  Administered 2023-09-01 – 2023-09-02 (×2): 20 [IU] via SUBCUTANEOUS
  Filled 2023-09-01 (×3): qty 0.2

## 2023-09-01 MED ORDER — DEXTROSE 50 % IV SOLN: 0.0000 mL | INTRAVENOUS | Status: DC | PRN

## 2023-09-01 MED ORDER — HYDRALAZINE HCL 20 MG/ML IJ SOLN
10.0000 mg | Freq: Four times a day (QID) | INTRAMUSCULAR | Status: DC | PRN
  Administered 2023-09-01 – 2023-09-02 (×3): 10 mg via INTRAVENOUS
  Filled 2023-09-01 (×3): qty 1

## 2023-09-01 MED ORDER — KETOROLAC TROMETHAMINE 30 MG/ML IJ SOLN
30.0000 mg | Freq: Four times a day (QID) | INTRAMUSCULAR | Status: DC | PRN
  Administered 2023-09-01 – 2023-09-02 (×2): 30 mg via INTRAVENOUS
  Filled 2023-09-01 (×2): qty 1

## 2023-09-01 MED ORDER — LACTATED RINGERS IV SOLN: INTRAVENOUS | Status: DC

## 2023-09-01 NOTE — Progress Notes (Signed)
 Patient was highly hyperglycemic -CBG 533.  She was started on Endo tool for hyperglycemia.

## 2023-09-01 NOTE — Progress Notes (Addendum)
 PROGRESS NOTE    Veronica Moses  ZOX:[id number] DOB: June 18, 1955 DOA: 08/31/2023 PCP: Wendi Ham, NP   Brief Narrative:    Veronica Moses is a 68 y.o. female with medical history significant of  COPD, diabetes mellitus, seizure disorder, tobacco abuse who presents to the emergency department due to fall, shortness of breath and confusion.  Patient was admitted for acute hypoxemic respiratory failure secondary to COPD exacerbation and is also noted to have fracture of the left ulnar styloid and orthopedics recommending nonoperative management with wrist brace.  She was noted to have significant hyperglycemia developed overnight likely in the setting of steroid use and is now on insulin  drip which will be transitioned to subcutaneous long-acting and short acting insulin  with initiation of diet.  Assessment & Plan:   Principal Problem:   Acute respiratory failure with hypoxia (HCC) Active Problems:   Acute exacerbation of chronic obstructive pulmonary disease (COPD) (HCC)   Fall at home, initial encounter   Seizure (HCC)   Hypoalbuminemia due to protein-calorie malnutrition (HCC)   Essential hypertension   Type 2 diabetes mellitus with hyperglycemia (HCC)   Insomnia   Mixed hyperlipidemia   Displaced fracture of left ulna styloid process, initial encounter for closed fracture  Assessment and Plan:  Acute respiratory failure with hypoxemia secondary to COPD exacerbation Acute metabolic encephalopathy possibly secondary to above Continue duo nebs, Mucinex , and azithromycin, Solu-Medrol  to prednisone  Continue Protonix  to prevent steroid-induced ulcer Continue incentive spirometry and flutter valve Continue supplemental oxygen  to maintain O2 sat > 94% with plan to wean patient off oxygen  as tolerated Continue fall precaution, delirium precautions, aspiration precaution CT imaging with some left lower lobe pneumonia, continue azithromycin and add Rocephin    Fracture of left ulna styloid Left  wrist x-ray showed small  minimally displaced fracture at the tip of the ulnar styloid. Continue home IV morphine  2 mg every 4 hours as needed Patient will be placed n.p.o. in anticipation for possible surgical procedure in the morning Orthopedic surgeon (Dr. Ernesta Heading) was consulted and will see patient today with plans for wrist brace and no operative management   Fall at home Continue fall precaution Continue PT/OT eval and treat   Type 2 diabetes mellitus with steroid-induced hyperglycemia Hemoglobin A1c on 08/03/2023 close 8.9 Currently on insulin  drip and n.p.o. diet will be initiated and SSI started   Essential hypertension-uncontrolled Continue IV hydralazine  10 mg every 6 hours for SBP > 170 Continue losartan    Hypoalbuminemia possibly secondary to mild protein calorie malnutrition Albumin 3.1, protein supplement will be provided   Seizure Continue Keppra    Insomnia Continue home Xanax    Mixed hyperlipidemia Continue Crestor   Obesity, class I BMI 32.12   DVT prophylaxis: SCDs Code Status: Full Family Communication: Husband at bedside 6/17 Disposition Plan:  Status is: Inpatient Remains inpatient appropriate because: Need for IV medications.  Consultants:  Orthopedics  Procedures:  None  Antimicrobials:  Anti-infectives (From admission, onward)    Start     Dose/Rate Route Frequency Ordered Stop   09/02/23 1000  azithromycin (ZITHROMAX) tablet 250 mg       Placed in Followed by Linked Group   250 mg Oral Daily 08/31/23 2159 09/06/23 0959   09/01/23 1000  azithromycin (ZITHROMAX) tablet 500 mg       Placed in Followed by Linked Group   500 mg Oral Daily 08/31/23 2159 09/01/23 0902       Subjective: Patient seen and evaluated today with no new acute complaints or concerns. No acute  concerns or events noted overnight.  Denies any significant pain.  Objective: Vitals:   09/01/23 0727 09/01/23 0742 09/01/23 0900 09/01/23 1000  BP:   (!) 171/54 (!)  167/39  Pulse:   (!) 104 94  Resp:   17 20  Temp: 98.1 F (36.7 C)     TempSrc: Oral     SpO2:  96% 94% 95%  Weight:      Height:        Intake/Output Summary (Last 24 hours) at 09/01/2023 1146 Last data filed at 09/01/2023 0513 Gross per 24 hour  Intake 473.86 ml  Output 800 ml  Net -326.14 ml   Filed Weights   08/31/23 1335 08/31/23 2120 09/01/23 0511  Weight: 58 kg 59.1 kg 77.1 kg    Examination:  General exam: Appears calm and comfortable  Respiratory system: Minimal wheezing bilaterally. Respiratory effort normal.  2 L nasal cannula Cardiovascular system: S1 & S2 heard, RRR.  Gastrointestinal system: Abdomen is soft Central nervous system: Alert and awake Extremities: No edema Skin: No significant lesions noted Psychiatry: Flat affect.    Data Reviewed: I have personally reviewed following labs and imaging studies  CBC: Recent Labs  Lab 08/31/23 1341 09/01/23 0139  WBC 10.4 6.5  HGB 12.1 10.4*  HCT 38.5 31.7*  MCV 96.0 94.3  PLT 303 278   Basic Metabolic Panel: Recent Labs  Lab 08/31/23 1341 09/01/23 0139 09/01/23 0632  NA 138 135 136  K 3.5 3.9 3.6  CL 96* 95* 98  CO2 31 26 28   GLUCOSE 278* 533* 432*  BUN 11 16 18   CREATININE 0.93 0.96 0.90  CALCIUM  9.8 9.1 9.0  MG  --  1.3*  --   PHOS  --  4.7*  --    GFR: Estimated Creatinine Clearance: 56.2 mL/min (by C-G formula based on SCr of 0.9 mg/dL). Liver Function Tests: Recent Labs  Lab 08/31/23 1341 09/01/23 0139  AST 37 32  ALT 28 25  ALKPHOS 39 35*  BILITOT 0.3 0.6  PROT 6.7 6.1*  ALBUMIN 3.1* 2.8*   Recent Labs  Lab 08/31/23 1341  LIPASE 21   No results for input(s): AMMONIA in the last 168 hours. Coagulation Profile: No results for input(s): INR, PROTIME in the last 168 hours. Cardiac Enzymes: No results for input(s): CKTOTAL, CKMB, CKMBINDEX, TROPONINI in the last 168 hours. BNP (last 3 results) No results for input(s): PROBNP in the last 8760  hours. HbA1C: No results for input(s): HGBA1C in the last 72 hours. CBG: Recent Labs  Lab 09/01/23 0648 09/01/23 0727 09/01/23 0833 09/01/23 0938 09/01/23 1057  GLUCAP 409* 344* 285* 225* 208*   Lipid Profile: No results for input(s): CHOL, HDL, LDLCALC, TRIG, CHOLHDL, LDLDIRECT in the last 72 hours. Thyroid  Function Tests: No results for input(s): TSH, T4TOTAL, FREET4, T3FREE, THYROIDAB in the last 72 hours. Anemia Panel: No results for input(s): VITAMINB12, FOLATE, FERRITIN, TIBC, IRON , RETICCTPCT in the last 72 hours. Sepsis Labs: No results for input(s): PROCALCITON, LATICACIDVEN in the last 168 hours.  No results found for this or any previous visit (from the past 240 hours).       Radiology Studies: CT Angio Chest PE W and/or Wo Contrast Result Date: 08/31/2023 CLINICAL DATA:  Marvell Slider in bathtub short of breath EXAM: CT ANGIOGRAPHY CHEST WITH CONTRAST TECHNIQUE: Multidetector CT imaging of the chest was performed using the standard protocol during bolus administration of intravenous contrast. Multiplanar CT image reconstructions and MIPs were obtained to evaluate the vascular  anatomy. RADIATION DOSE REDUCTION: This exam was performed according to the departmental dose-optimization program which includes automated exposure control, adjustment of the mA and/or kV according to patient size and/or use of iterative reconstruction technique. CONTRAST:  75mL OMNIPAQUE  IOHEXOL  350 MG/ML SOLN COMPARISON:  Chest x-ray 08/31/2023, chest CT 08/19/2023 FINDINGS: Cardiovascular: Satisfactory opacification of the pulmonary arteries to the segmental level. No evidence of pulmonary embolism. Mild cardiomegaly. No pericardial effusion. Mild aortic atherosclerosis. No aneurysm or dissection. Coronary vascular calcification. Mediastinum/Nodes: No enlarged mediastinal, hilar, or axillary lymph nodes. Thyroid  gland, trachea, and esophagus demonstrate no significant  findings. Lungs/Pleura: Atelectasis versus minimal infiltrate left lung base. Patchy mucous plugging in left lower lobe. Upper Abdomen: No acute finding Musculoskeletal: No acute osseous abnormality. Review of the MIP images confirms the above findings. IMPRESSION: 1. Negative for acute pulmonary embolus or aortic dissection. 2. Mild cardiomegaly. 3. Atelectasis versus minimal infiltrate in the left lung base. Patchy mucous plugging in the left lower lobe. 4. Aortic atherosclerosis. Aortic Atherosclerosis (ICD10-I70.0). Electronically Signed   By: Esmeralda Hedge M.D.   On: 08/31/2023 18:33   MR BRAIN WO CONTRAST Result Date: 08/31/2023 CLINICAL DATA:  Delirium. EXAM: MRI HEAD WITHOUT CONTRAST TECHNIQUE: Multiplanar, multiecho pulse sequences of the brain and surrounding structures were obtained without intravenous contrast. COMPARISON:  CT of the head dated August 31, 2023 and MRI of the brain dated March 16, 2023. FINDINGS: Brain: There is moderate cerebral volume loss present. There are confluent zones of increased T2 signal present within the periventricular white matter, particularly within the frontal lobes bilaterally. There is also moderate subcortical white matter disease. There is no evidence of restricted diffusion to indicate acute or recent infarction. There is no evidence of hemorrhage, mass or hydrocephalus. Vascular: Normal vascular flow voids. Skull and upper cervical spine: Intact and normal in signal intensity. No lesions are evident. Sinuses/Orbits: Unremarkable. Other: None. IMPRESSION: 1. Age-related atrophy and moderately advanced cerebral white matter disease, particularly involving the periventricular white matter. No apparent acute process. Electronically Signed   By: Maribeth Shivers M.D.   On: 08/31/2023 16:32   DG Pelvis Portable Result Date: 08/31/2023 CLINICAL DATA:  Marvell Slider last night, recent hip replacement EXAM: PORTABLE PELVIS 1-2 VIEWS COMPARISON:  08/20/2023 FINDINGS: Frontal  view of the pelvis includes both hips. Right hip hemiarthroplasty is again identified in the expected position without evidence of complication. The distal margin of the femoral component is excluded by collimation. Stable mild left hip osteoarthritis. No acute displaced fractures. The sacroiliac joints are normal. Skin staples overlie the right hip. IMPRESSION: 1. No acute displaced fracture. 2. Stable right hip hemiarthroplasty, without evidence of acute complication. Distal margin of the femoral component is excluded by collimation. Electronically Signed   By: Bobbye Burrow M.D.   On: 08/31/2023 15:25   DG Wrist Complete Left Result Date: 08/31/2023 CLINICAL DATA:  Marvell Slider, left wrist pain EXAM: LEFT WRIST - COMPLETE 3+ VIEW COMPARISON:  None Available. FINDINGS: Frontal, oblique, lateral, and ulnar deviated views of the left wrist are obtained. Bones are diffusely osteopenic. There is a minimally displaced fracture at the tip of the ulnar styloid, with near anatomic alignment. No other acute bony abnormalities. Mild osteoarthritis at the radiocarpal joint and within the radial aspect of the carpus. Dorsal soft tissue swelling. IMPRESSION: 1. Small minimally displaced fracture at the tip of the ulnar styloid. 2. Dorsal soft tissue swelling. 3. Diffuse osteopenia. 4. Mild osteoarthritis. Electronically Signed   By: Bobbye Burrow M.D.   On: 08/31/2023  15:24   DG Chest 1 View Result Date: 08/31/2023 CLINICAL DATA:  Marvell Slider last night, hypoxia EXAM: CHEST  1 VIEW COMPARISON:  08/18/2023 FINDINGS: Single frontal view of the chest demonstrates a stable cardiac silhouette. No airspace disease, effusion, or pneumothorax. No acute displaced fractures. IMPRESSION: 1. No acute intrathoracic process. Electronically Signed   By: Bobbye Burrow M.D.   On: 08/31/2023 15:23   CT Head Wo Contrast Result Date: 08/31/2023 CLINICAL DATA:  Marvell Slider last night, short of breath, hypoxia EXAM: CT HEAD WITHOUT CONTRAST TECHNIQUE:  Contiguous axial images were obtained from the base of the skull through the vertex without intravenous contrast. RADIATION DOSE REDUCTION: This exam was performed according to the departmental dose-optimization program which includes automated exposure control, adjustment of the mA and/or kV according to patient size and/or use of iterative reconstruction technique. COMPARISON:  03/10/2020 FINDINGS: Brain: Confluent hypodensities throughout the periventricular white matter, as well as focal hypodensities within the right side of the pons and bilateral basal ganglia, compatible with chronic small vessel ischemic changes. No signs of acute infarct or hemorrhage. Lateral ventricles and remaining midline structures are unremarkable. No acute extra-axial fluid collections. No mass effect. Vascular: No hyperdense vessel or unexpected calcification. Skull: Normal. Negative for fracture or focal lesion. Sinuses/Orbits: No acute finding. Other: None. IMPRESSION: 1. No acute intracranial process. Electronically Signed   By: Bobbye Burrow M.D.   On: 08/31/2023 15:22        Scheduled Meds:  ALPRAZolam   1 mg Oral QHS   [START ON 09/02/2023] azithromycin  250 mg Oral Daily   Chlorhexidine  Gluconate Cloth  6 each Topical Daily   dextromethorphan -guaiFENesin   1 tablet Oral BID   donepezil   10 mg Oral QHS   feeding supplement (GLUCERNA SHAKE)  237 mL Oral TID BM   ipratropium-albuterol   3 mL Nebulization Q6H   levETIRAcetam   1,000 mg Oral BID   losartan   50 mg Oral Daily   methylPREDNISolone  (SOLU-MEDROL ) injection  40 mg Intravenous Q12H   pantoprazole   40 mg Oral Daily   rosuvastatin   20 mg Oral Daily   Continuous Infusions:  dextrose  5% lactated ringers  125 mL/hr at 09/01/23 0943   insulin  10 Units/hr (09/01/23 0618)   lactated ringers  125 mL/hr at 09/01/23 0426     LOS: 1 day    Time spent: 55 minutes    Quilla Freeze D Mason Sole, DO [facility]  If 7PM-7AM, please contact  night-coverage [email or web address] 09/01/2023, 11:46 AM

## 2023-09-01 NOTE — Plan of Care (Signed)
  Problem: Acute Rehab PT Goals(only PT should resolve) Goal: Pt Will Go Supine/Side To Sit Outcome: Progressing Flowsheets (Taken 09/01/2023 1508) Pt will go Supine/Side to Sit:  with supervision  with contact guard assist Goal: Patient Will Transfer Sit To/From Stand Outcome: Progressing Flowsheets (Taken 09/01/2023 1508) Patient will transfer sit to/from stand:  with supervision  with contact guard assist Goal: Pt Will Transfer Bed To Chair/Chair To Bed Outcome: Progressing Flowsheets (Taken 09/01/2023 1508) Pt will Transfer Bed to Chair/Chair to Bed:  with contact guard assist  with min assist Goal: Pt Will Ambulate Outcome: Progressing Flowsheets (Taken 09/01/2023 1508) Pt will Ambulate:  with minimal assist  with rolling walker Note: Platform walker   3:09 PM, 09/01/23 Walton Guppy, MPT Physical Therapist with [facility] [phone] office [phone] mobile phone

## 2023-09-01 NOTE — Progress Notes (Signed)
 Date and time results received: 09/01/23 0227  Test: Glucose  Critical Value: 533  Name of Provider Notified: O. Adefeso   Orders Received? Or Actions Taken?: MD informed. Awaiting orders.  [place] RN

## 2023-09-01 NOTE — Inpatient Diabetes Management (Signed)
 Inpatient Diabetes Program Recommendations  AACE/ADA: New Consensus Statement on Inpatient Glycemic Control (2015)  Target Ranges:  Prepandial:   less than 140 mg/dL      Peak postprandial:   less than 180 mg/dL (1-2 hours)      Critically ill patients:  140 - 180 mg/dL   Lab Results  Component Value Date   GLUCAP 344 (H) 09/01/2023   HGBA1C 8.9 (H) 08/03/2023    Review of Glycemic Control  Diabetes history: DM2 Outpatient Diabetes medications: Lantus  20 units daily, Novolog  SSI, glipizide 5 mg daily, Ozempic 0.5 mg weekly  Current orders for Inpatient glycemic control: IV insulin  per [employer] for DKA  HgbA1C - 8.9%  Inpatient Diabetes Program Recommendations:    Continue IV insulin  until criteria met for discontinuation.   Will follow closely.  Thank you. Joni Net, RD, LDN, CDCES Inpatient Diabetes Coordinator [phone]

## 2023-09-01 NOTE — TOC Transition Note (Addendum)
 Transition of Care [facility]) - Discharge Note   Patient Details  Name: Veronica Moses MRN: [id number] Date of Birth: 1955/08/03  [facility] CM/SW Contact:  Orelia Binet, RN Phone Number: 09/01/2023, 3:48 PM   Clinical Narrative:   Patient admitted with Acute respiratory failure, recently admitted and assessed. Daughter at the bedside.  Patient lives with her husband, discharged home last week with [facility] home health and bed side commode. PT is recommending a platform walker.  Referral sent to Zach with [employer], narrative completed. MD aware to order HHPT.  Daughter felt like she should have went home with oxygent last week, CM explained the sat qual for insurance.  TOC following.   Addendum : Per [employer], patient got a walker in June, insurance will not cover.    Barriers to Discharge: Barriers Resolved   Patient Goals and CMS Choice Patient states their goals for this hospitalization and ongoing recovery are:: return Home CMS Medicare.gov Compare Post Acute Care list provided to:: Patient Choice offered to / list presented to : Patient, Adult Children      Discharge Placement     Discharge Plan and Services Additional resources added to the After Visit Summary for       Social Drivers of Health (SDOH) Interventions SDOH Screenings   Food Insecurity: No Food Insecurity (08/31/2023)  Housing: Low Risk  (08/31/2023)  Transportation Needs: No Transportation Needs (08/31/2023)  Utilities: Not At Risk (08/31/2023)  Depression (PHQ2-9): Low Risk  (08/24/2023)  Social Connections: Moderately Isolated (08/31/2023)  Tobacco Use: High Risk (08/31/2023)     Readmission Risk Interventions    09/01/2023    3:47 PM 08/22/2023    1:18 PM 08/19/2023    1:05 PM  Readmission Risk Prevention Plan  Transportation Screening Complete Complete Complete  PCP or Specialist Appt within 3-5 Days Not Complete Not Complete   HRI or Home Care Consult Complete Complete Complete  Social Work Consult  for Recovery Care Planning/Counseling Complete Complete Complete  Palliative Care Screening Not Applicable Not Applicable Not Applicable  Medication Review Oceanographer) Complete Complete Complete

## 2023-09-01 NOTE — Evaluation (Signed)
 Physical Therapy Evaluation Patient Details Name: Veronica Moses MRN: [id number] DOB: April 12, 1955 Today's Date: 09/01/2023  History of Present Illness  Veronica Moses is a 68 y.o. female with medical history significant of  COPD, diabetes mellitus, seizure disorder, tobacco abuse who presents to the emergency department due to fall, shortness of breath and confusion.  Patient was unable to provide history, history was obtained from ED PA and ED medical record.  Per report, patient fell when she went to use the spare bathroom at home.  Husband heard a loud thud and quickly went to the bathroom and found patient on the floor.  She admitted to blunt head trauma and left wrist pain.  She states that she was going to the bathroom, this was unusual, since she usually uses the bathroom attached to her bedroom.   She also complains of recent unproductive cough, but she denies fever, chills, nausea or vomiting.  O2 sat at physical therapy today was noted to be in the 80s.  EMS was activated and patient was sent to the ED for further evaluation.  In the ED, patient was unable to identify daughter which was a change from baseline dementia.     She was recently admitted from 6/30 through 6/7 due to right hip fracture (displaced femoral neck fracture) s/p right hip hemiarthroplasty.   Clinical Impression  Patient presents with splint to left forearm, wrist, has difficulty scooting to EOB due to limited use of LUE, able to take a few steps in room with hand hand held assist and leaning on RW with right hand. Patient left seated on commode with nurse tech assisting after therapy, patient spouse in room. Patient will benefit from continued skilled physical therapy in hospital and recommended venue below to increase strength, balance, endurance for safe ADLs and gait.         If plan is discharge home, recommend the following: A little help with bathing/dressing/bathroom;Help with stairs or ramp for entrance;Assistance with  cooking/housework;A lot of help with walking and/or transfers   Can travel by private vehicle   Yes    Equipment Recommendations Other (comment) (Platform rolling walker (2 wheels))  Recommendations for Other Services       Functional Status Assessment Patient has had a recent decline in their functional status and demonstrates the ability to make significant improvements in function in a reasonable and predictable amount of time.     Precautions / Restrictions Precautions Precautions: Fall Recall of Precautions/Restrictions: Impaired Precaution/Restrictions Comments: anterior hip Restrictions Weight Bearing Restrictions Per Provider Order: Yes RLE Weight Bearing Per Provider Order: Weight bearing as tolerated      Mobility  Bed Mobility Overal bed mobility: Needs Assistance Bed Mobility: Supine to Sit     Supine to sit: Mod assist     General bed mobility comments: increased time, limited use of LUE due to pain and in splint    Transfers Overall transfer level: Needs assistance Equipment used: Rolling walker (2 wheels), 1 person hand held assist Transfers: Sit to/from Stand, Bed to chair/wheelchair/BSC Sit to Stand: Min assist   Step pivot transfers: Min assist       General transfer comment: leaning on RW with right hand, supported with hand held assist for LUE    Ambulation/Gait Ambulation/Gait assistance: Min assist, Mod assist Gait Distance (Feet): 10 Feet Assistive device: 1 person hand held assist Gait Pattern/deviations: Decreased step length - right, Decreased step length - left, Decreased stride length, Decreased stance time - right, Antalgic Gait velocity:  decreased     General Gait Details: limited to a few steps in room with LUE supported with hand held assist or leaning on RW with right hand, hand held assist for left  Stairs            Wheelchair Mobility     Tilt Bed    Modified Rankin (Stroke Patients Only)       Balance  Overall balance assessment: Needs assistance Sitting-balance support: No upper extremity supported, Feet supported Sitting balance-Leahy Scale: Fair Sitting balance - Comments: fair/good seated at EOB   Standing balance support: During functional activity, Single extremity supported Standing balance-Leahy Scale: Poor Standing balance comment: fair/poor with hand held assist                             Pertinent Vitals/Pain Pain Assessment Pain Assessment: Faces Faces Pain Scale: Hurts little more Pain Location: left wrist Pain Descriptors / Indicators: Discomfort, Sore, Grimacing Pain Intervention(s): Limited activity within patient's tolerance, Monitored during session, Repositioned    Home Living Family/patient expects to be discharged to:: Private residence Living Arrangements: Spouse/significant other Available Help at Discharge: Family Type of Home: House Home Access: Stairs to enter Entrance Stairs-Rails: Lawyer of Steps: 5   Home Layout: One level Home Equipment: Agricultural consultant (2 wheels);Cane - single point      Prior Function Prior Level of Function : Independent/Modified Independent             Mobility Comments: Tourist information centre manager without AD; does not drive ADLs Comments: Assisted by family     Extremity/Trunk Assessment   Upper Extremity Assessment Upper Extremity Assessment: Generalized weakness;LUE deficits/detail LUE Deficits / Details: grossly -4/5 except wrist in splint due to fracture LUE: Unable to fully assess due to pain;Unable to fully assess due to immobilization LUE Sensation: WNL LUE Coordination: WNL    Lower Extremity Assessment Lower Extremity Assessment: Generalized weakness       Communication   Communication Communication: No apparent difficulties    Cognition Arousal: Alert Behavior During Therapy: WFL for tasks assessed/performed   PT - Cognitive impairments: No apparent  impairments                         Following commands: Impaired Following commands impaired: Follows one step commands with increased time     Cueing Cueing Techniques: Verbal cues, Tactile cues     General Comments      Exercises     Assessment/Plan    PT Assessment Patient needs continued PT services  PT Problem List Decreased strength;Decreased activity tolerance;Decreased balance;Decreased mobility;Pain       PT Treatment Interventions DME instruction;Gait training;Stair training;Functional mobility training;Therapeutic activities;Therapeutic exercise;Balance training;Patient/family education    PT Goals (Current goals can be found in the Care Plan section)  Acute Rehab PT Goals Patient Stated Goal: return home with family PT Goal Formulation: With patient/family Time For Goal Achievement: 09/22/23 Potential to Achieve Goals: Good    Frequency Min 3X/week     Co-evaluation               AM-PAC PT 6 Clicks Mobility  Outcome Measure Help needed turning from your back to your side while in a flat bed without using bedrails?: A Lot Help needed moving from lying on your back to sitting on the side of a flat bed without using bedrails?: A Lot Help needed moving to and from  a bed to a chair (including a wheelchair)?: A Little Help needed standing up from a chair using your arms (e.g., wheelchair or bedside chair)?: A Little Help needed to walk in hospital room?: A Lot Help needed climbing 3-5 steps with a railing? : A Lot 6 Click Score: 14    End of Session   Activity Tolerance: Patient tolerated treatment well;Patient limited by fatigue Patient left: with call bell/phone within reach;with family/visitor present;Other (comment) (left seated on commode) Nurse Communication: Mobility status PT Visit Diagnosis: Unsteadiness on feet (R26.81);Other abnormalities of gait and mobility (R26.89);Muscle weakness (generalized) (M62.81);Pain Pain - Right/Left:  Left Pain - part of body: Arm    Time: 1610-9604 PT Time Calculation (min) (ACUTE ONLY): 32 min   Charges:   PT Evaluation $PT Eval Moderate Complexity: 1 Mod PT Treatments $Therapeutic Activity: 23-37 mins PT General Charges $$ ACUTE PT VISIT: 1 Visit         3:06 PM, 09/01/23 Walton Guppy, MPT Physical Therapist with [facility] [phone] office [phone] mobile phone

## 2023-09-01 NOTE — Plan of Care (Signed)
  Problem: Education: Goal: Knowledge of General Education information will improve Description: Including pain rating scale, medication(s)/side effects and non-pharmacologic comfort measures Outcome: Not Progressing   Problem: Health Behavior/Discharge Planning: Goal: Ability to manage health-related needs will improve Outcome: Not Progressing   Problem: Clinical Measurements: Goal: Ability to maintain clinical measurements within normal limits will improve Outcome: Not Progressing Goal: Will remain free from infection Outcome: Not Progressing Goal: Diagnostic test results will improve Outcome: Not Progressing Goal: Respiratory complications will improve Outcome: Not Progressing Goal: Cardiovascular complication will be avoided Outcome: Not Progressing   Problem: Activity: Goal: Risk for activity intolerance will decrease Outcome: Not Progressing   Problem: Nutrition: Goal: Adequate nutrition will be maintained Outcome: Not Progressing   Problem: Coping: Goal: Level of anxiety will decrease Outcome: Not Progressing   Problem: Elimination: Goal: Will not experience complications related to bowel motility Outcome: Not Progressing Goal: Will not experience complications related to urinary retention Outcome: Not Progressing   Problem: Pain Managment: Goal: General experience of comfort will improve and/or be controlled Outcome: Not Progressing   Problem: Safety: Goal: Ability to remain free from injury will improve Outcome: Not Progressing   Problem: Education: Goal: Ability to describe self-care measures that may prevent or decrease complications (Diabetes Survival Skills Education) will improve Outcome: Not Progressing Goal: Individualized Educational Video(s) Outcome: Not Progressing   Problem: Coping: Goal: Ability to adjust to condition or change in health will improve Outcome: Not Progressing   Problem: Fluid Volume: Goal: Ability to maintain a balanced  intake and output will improve Outcome: Not Progressing   Problem: Health Behavior/Discharge Planning: Goal: Ability to identify and utilize available resources and services will improve Outcome: Not Progressing Goal: Ability to manage health-related needs will improve Outcome: Not Progressing   Problem: Metabolic: Goal: Ability to maintain appropriate glucose levels will improve Outcome: Not Progressing   Problem: Nutritional: Goal: Maintenance of adequate nutrition will improve Outcome: Not Progressing Goal: Progress toward achieving an optimal weight will improve Outcome: Not Progressing   Problem: Skin Integrity: Goal: Risk for impaired skin integrity will decrease Outcome: Not Progressing   Problem: Tissue Perfusion: Goal: Adequacy of tissue perfusion will improve Outcome: Not Progressing   Problem: Education: Goal: Ability to describe self-care measures that may prevent or decrease complications (Diabetes Survival Skills Education) will improve Outcome: Not Progressing Goal: Individualized Educational Video(s) Outcome: Not Progressing   Problem: Cardiac: Goal: Ability to maintain an adequate cardiac output will improve Outcome: Not Progressing   Problem: Health Behavior/Discharge Planning: Goal: Ability to identify and utilize available resources and services will improve Outcome: Not Progressing Goal: Ability to manage health-related needs will improve Outcome: Not Progressing   Problem: Fluid Volume: Goal: Ability to achieve a balanced intake and output will improve Outcome: Not Progressing   Problem: Metabolic: Goal: Ability to maintain appropriate glucose levels will improve Outcome: Not Progressing   Problem: Nutritional: Goal: Maintenance of adequate nutrition will improve Outcome: Not Progressing Goal: Maintenance of adequate weight for body size and type will improve Outcome: Not Progressing   Problem: Respiratory: Goal: Will regain and/or  maintain adequate ventilation Outcome: Not Progressing   Problem: Urinary Elimination: Goal: Ability to achieve and maintain adequate renal perfusion and functioning will improve Outcome: Not Progressing

## 2023-09-01 NOTE — Progress Notes (Signed)
 Orthopaedic Postop Note  Assessment: Veronica Moses is a 68 y.o. female s/p right hip hemiarthroplasty, anterior approach  DOS: 08/20/23  Sustained a fall at home, small minimally displaced left ulnar styloid fracture  Plan: Veronica Moses was admitted with a COPD exacerbation.  Recent fall with new left wrist pain.  She had been doing very well regarding her right hip.    Follow-up: 1-2 weeks in clinc  XR at next visit: AP pelvis and right hip XR  Subjective:  Chief Complaint  Patient presents with   Shortness of Breath    History of Present Illness: Veronica Moses is a 68 y.o. female who is being evaluated for left wrist pain following the above stated procedure.  She had been doing well at home until recently when she sustained a fall.  According to family she was moving well and progressing.  She recently fell and has new left wrist pain.  She was also complaining of SOB and contacted the clinic prior to routine postop.  She was advised to come to the ED.  Review of Systems: No fevers or chills No numbness or tingling No Chest Pain + shortness of breath   Objective: BP (!) 174/59   Pulse (!) 104   Temp 99.1 F (37.3 C) (Oral)   Resp 15   Ht 5' 1 (1.549 m)   Wt 77.1 kg   SpO2 97%   BMI 32.12 kg/m   Physical Exam:  Sleepy Responds to questions  Left hand in an ulnar gutter splint.  Clean, dry and intact Fingers are warm and well perfused  Right hip incision is healing well Staples in place No erythema or drainage Toes are WWP Active motion intact EHL, TA  IMAGING: I personally and reviewed the following images:   Left wrist with small, minimally displaced ulnar styloid fracture  AP pelvis demonstrates well positioned right hip hemiarthroplasty   Tonita Frater, MD 09/01/2023 1:39 PM

## 2023-09-01 NOTE — TOC Progression Note (Signed)
 DME note    Patient Details  Name: Veronica Moses MRN: [id number] Date of Birth: Jun 10, 1955  Transition of Care [facility]) CM/SW Contact  Orelia Binet, RN Phone Number: 09/01/2023, 3:58 PM   Patient has a mobility limitation that significant of COPD, diabetes mellitus, seizure disorders which impairs her ability to  participate in one or more mobility related activities of daily living. Patient has Left wrist fracture, she could safely use the walker with Left Arm platform attachment.

## 2023-09-01 NOTE — Plan of Care (Signed)
   Problem: Education: Goal: Knowledge of General Education information will improve Description Including pain rating scale, medication(s)/side effects and non-pharmacologic comfort measures Outcome: Progressing   Problem: Health Behavior/Discharge Planning: Goal: Ability to manage health-related needs will improve Outcome: Progressing

## 2023-09-02 DIAGNOSIS — S52612A Displaced fracture of left ulna styloid process, initial encounter for closed fracture: Secondary | ICD-10-CM | POA: Diagnosis not present

## 2023-09-02 DIAGNOSIS — I10 Essential (primary) hypertension: Secondary | ICD-10-CM | POA: Diagnosis not present

## 2023-09-02 DIAGNOSIS — J9601 Acute respiratory failure with hypoxia: Secondary | ICD-10-CM | POA: Diagnosis not present

## 2023-09-02 DIAGNOSIS — J441 Chronic obstructive pulmonary disease with (acute) exacerbation: Secondary | ICD-10-CM | POA: Diagnosis not present

## 2023-09-02 LAB — BASIC METABOLIC PANEL WITH GFR
Anion gap: 11 (ref 5–15)
BUN: 18 mg/dL (ref 8–23)
CO2: 28 mmol/L (ref 22–32)
Calcium: 8.9 mg/dL (ref 8.9–10.3)
Chloride: 102 mmol/L (ref 98–111)
Creatinine, Ser: 0.84 mg/dL (ref 0.44–1.00)
GFR, Estimated: >60 mL/min (ref >60)
Glucose, Bld: 280 mg/dL — ABNORMAL HIGH (ref 70–99)
Potassium: 3.1 mmol/L — ABNORMAL LOW (ref 3.5–5.1)
Sodium: 141 mmol/L (ref 135–145)

## 2023-09-02 LAB — CBC
HCT: 31.5 % — ABNORMAL LOW (ref 36.0–46.0)
Hemoglobin: 10 g/dL — ABNORMAL LOW (ref 12.0–15.0)
MCH: 30.3 pg (ref 26.0–34.0)
MCHC: 31.7 g/dL (ref 30.0–36.0)
MCV: 95.5 fL (ref 80.0–100.0)
Platelets: 318 10*3/uL (ref 150–400)
RBC: 3.3 MIL/uL — ABNORMAL LOW (ref 3.87–5.11)
RDW: 17 % — ABNORMAL HIGH (ref 11.5–15.5)
WBC: 12.3 10*3/uL — ABNORMAL HIGH (ref 4.0–10.5)
nRBC: 0 % (ref 0.0–0.2)

## 2023-09-02 LAB — GLUCOSE, CAPILLARY
Glucose-Capillary: 169 mg/dL — ABNORMAL HIGH (ref 70–99)
Glucose-Capillary: 229 mg/dL — ABNORMAL HIGH (ref 70–99)
Glucose-Capillary: 270 mg/dL — ABNORMAL HIGH (ref 70–99)
Glucose-Capillary: 290 mg/dL — ABNORMAL HIGH (ref 70–99)
Glucose-Capillary: 294 mg/dL — ABNORMAL HIGH (ref 70–99)

## 2023-09-02 LAB — MAGNESIUM: Magnesium: 1.4 mg/dL — ABNORMAL LOW (ref 1.7–2.4)

## 2023-09-02 MED ORDER — PREDNISONE 20 MG PO TABS: 20.0000 mg | ORAL_TABLET | Freq: Every day | ORAL | 0 refills | Status: AC

## 2023-09-02 MED ORDER — INSULIN ASPART 100 UNIT/ML IJ SOLN
3.0000 [IU] | Freq: Once | INTRAMUSCULAR | Status: AC
  Administered 2023-09-02: 3 [IU] via SUBCUTANEOUS

## 2023-09-02 MED ORDER — SENNA 8.6 MG PO TABS: 1.0000 | ORAL_TABLET | Freq: Every day | ORAL | Status: DC | PRN

## 2023-09-02 MED ORDER — POLYETHYLENE GLYCOL 3350 17 G PO PACK: 17.0000 g | PACK | Freq: Every day | ORAL | Status: DC | PRN

## 2023-09-02 MED ORDER — IPRATROPIUM-ALBUTEROL 0.5-2.5 (3) MG/3ML IN SOLN
3.0000 mL | RESPIRATORY_TRACT | 1 refills | Status: AC | PRN
Start: 2023-09-02 — End: ?

## 2023-09-02 MED ORDER — CEFDINIR 300 MG PO CAPS: 300.0000 mg | ORAL_CAPSULE | Freq: Two times a day (BID) | ORAL | 0 refills | Status: AC

## 2023-09-02 MED ORDER — AZITHROMYCIN 250 MG PO TABS: 250.0000 mg | ORAL_TABLET | Freq: Every day | ORAL | 0 refills | Status: AC

## 2023-09-02 MED ORDER — DEXTROMETHORPHAN POLISTIREX ER 30 MG/5ML PO SUER: 30.0000 mg | Freq: Two times a day (BID) | ORAL | 0 refills | Status: DC | PRN

## 2023-09-02 MED ORDER — POTASSIUM CHLORIDE CRYS ER 20 MEQ PO TBCR
40.0000 meq | EXTENDED_RELEASE_TABLET | Freq: Once | ORAL | Status: AC
  Administered 2023-09-02: 40 meq via ORAL
  Filled 2023-09-02: qty 2

## 2023-09-02 MED ORDER — ALBUTEROL SULFATE HFA 108 (90 BASE) MCG/ACT IN AERS: 2.0000 | INHALATION_SPRAY | Freq: Four times a day (QID) | RESPIRATORY_TRACT | 2 refills | Status: AC | PRN

## 2023-09-02 MED ORDER — MAGNESIUM SULFATE 4 GM/100ML IV SOLN
4.0000 g | Freq: Once | INTRAVENOUS | Status: AC
  Administered 2023-09-02: 4 g via INTRAVENOUS
  Filled 2023-09-02: qty 100

## 2023-09-02 MED ORDER — INSULIN ASPART 100 UNIT/ML IJ SOLN
10.0000 [IU] | Freq: Three times a day (TID) | INTRAMUSCULAR | Status: DC
  Administered 2023-09-02: 10 [IU] via SUBCUTANEOUS

## 2023-09-02 NOTE — Progress Notes (Signed)
 Physical Therapy Treatment Patient Details Name: Veronica Moses MRN: [id number] DOB: [birth year] Today's Date: 09/02/2023   History of Present Illness Veronica Moses is a 68 y.o. female with medical history significant of  COPD, diabetes mellitus, seizure disorder, tobacco abuse who presents to the emergency department due to fall, shortness of breath and confusion.  Patient was unable to provide history, history was obtained from ED PA and ED medical record.  Per report, patient fell when she went to use the spare bathroom at home.  Husband heard a loud thud and quickly went to the bathroom and found patient on the floor.  She admitted to blunt head trauma and left wrist pain.  She states that she was going to the bathroom, this was unusual, since she usually uses the bathroom attached to her bedroom.   She also complains of recent unproductive cough, but she denies fever, chills, nausea or vomiting.  O2 sat at physical therapy today was noted to be in the 80s.  EMS was activated and patient was sent to the ED for further evaluation.  In the ED, patient was unable to identify daughter which was a change from baseline dementia.     She was recently admitted from 6/30 through 6/7 due to right hip fracture (displaced femoral neck fracture) s/p right hip hemiarthroplasty.    PT Comments  Patient demonstrates increased endurance/distance for gait training using left platform on RW  without loss of balance and limited for ambulation mostly due to c/o fatigue. Patient tolerated sitting up on commode in bathroom with her son assisting after therapy. Patient will benefit from continued skilled physical therapy in hospital and recommended venue below to increase strength, balance, endurance for safe ADLs and gait.     If plan is discharge home, recommend the following: A little help with bathing/dressing/bathroom;Help with stairs or ramp for entrance;Assistance with cooking/housework;A lot of help with walking and/or  transfers   Can travel by private vehicle     Yes  Equipment Recommendations  Other (comment) (Left platform for RW)    Recommendations for Other Services       Precautions / Restrictions Precautions Precautions: Fall Recall of Precautions/Restrictions: Impaired Precaution/Restrictions Comments: anterior hip Restrictions Weight Bearing Restrictions Per Provider Order: Yes RLE Weight Bearing Per Provider Order: Weight bearing as tolerated Other Position/Activity Restrictions: NWB left wrist/hand     Mobility  Bed Mobility Overal bed mobility: Needs Assistance Bed Mobility: Supine to Sit     Supine to sit: Contact guard     General bed mobility comments: labored movement    Transfers Overall transfer level: Needs assistance Equipment used: Left platform walker Transfers: Sit to/from Stand, Bed to chair/wheelchair/BSC Sit to Stand: Contact guard assist   Step pivot transfers: Contact guard assist       General transfer comment: incrased balance and safety using left platform walker    Ambulation/Gait Ambulation/Gait assistance: Contact guard assist, Min assist Gait Distance (Feet): 65 Feet Assistive device: 1 person hand held assist, Left platform walker Gait Pattern/deviations: Decreased step length - right, Decreased step length - left, Decreased stride length, Decreased stance time - right, Antalgic Gait velocity: decreased     General Gait Details: increased distance/endurance for gait training with fair/good return for ambulating in room/hallway using left platfrom on RW without loss of balance, limited mostly due to fatigue   Stairs             Wheelchair Mobility     Tilt Bed  Modified Rankin (Stroke Patients Only)       Balance Overall balance assessment: Needs assistance Sitting-balance support: No upper extremity supported, Feet supported Sitting balance-Leahy Scale: Fair Sitting balance - Comments: fair/good seated at EOB    Standing balance support: During functional activity, Single extremity supported Standing balance-Leahy Scale: Fair Standing balance comment: using L platform walker                            Communication Communication Communication: No apparent difficulties  Cognition Arousal: Alert Behavior During Therapy: WFL for tasks assessed/performed   PT - Cognitive impairments: History of cognitive impairments                         Following commands: Intact      Cueing Cueing Techniques: Verbal cues, Tactile cues  Exercises      General Comments        Pertinent Vitals/Pain Pain Assessment Pain Assessment: Faces Pain Location: L wrist; R arm, R hip Pain Descriptors / Indicators: Discomfort, Sore, Grimacing Pain Intervention(s): Limited activity within patient's tolerance, Monitored during session, Repositioned    Home Living Family/patient expects to be discharged to:: Private residence Living Arrangements: Spouse/significant other Available Help at Discharge: Family Type of Home: House Home Access: Stairs to enter Entrance Stairs-Rails: Lawyer of Steps: 5   Home Layout: One level Home Equipment: Agricultural consultant (2 wheels);Cane - single point      Prior Function            PT Goals (current goals can now be found in the care plan section) Acute Rehab PT Goals Patient Stated Goal: return home with family PT Goal Formulation: With patient/family Time For Goal Achievement: 09/22/23 Potential to Achieve Goals: Good Progress towards PT goals: Progressing toward goals    Frequency    Min 3X/week      PT Plan      Co-evaluation PT/OT/SLP Co-Evaluation/Treatment: Yes Reason for Co-Treatment: To address functional/ADL transfers PT goals addressed during session: Mobility/safety with mobility;Balance;Proper use of DME OT goals addressed during session: ADL's and self-care      AM-PAC PT 6 Clicks Mobility    Outcome Measure  Help needed turning from your back to your side while in a flat bed without using bedrails?: A Little Help needed moving from lying on your back to sitting on the side of a flat bed without using bedrails?: A Little Help needed moving to and from a bed to a chair (including a wheelchair)?: A Little Help needed standing up from a chair using your arms (e.g., wheelchair or bedside chair)?: A Little Help needed to walk in hospital room?: A Little Help needed climbing 3-5 steps with a railing? : A Lot 6 Click Score: 17    End of Session   Activity Tolerance: Patient tolerated treatment well;Patient limited by fatigue Patient left: with call bell/phone within reach;with family/visitor present;Other (comment) (Patient left seated on commode in bathroom with son assisting)   PT Visit Diagnosis: Unsteadiness on feet (R26.81);Other abnormalities of gait and mobility (R26.89);Muscle weakness (generalized) (M62.81);Pain Pain - Right/Left: Left Pain - part of body: Arm     Time: 0630-1601 PT Time Calculation (min) (ACUTE ONLY): 24 min  Charges:    $Gait Training: 8-22 mins $Therapeutic Activity: 8-22 mins PT General Charges $$ ACUTE PT VISIT: 1 Visit  11:46 AM, 09/02/23 Walton Guppy, MPT Physical Therapist with [facility] 336 [phone] office [phone] mobile phone

## 2023-09-02 NOTE — TOC Transition Note (Signed)
 Transition of Care [facility]) - Discharge Note   Patient Details  Name: Veronica Moses MRN: [id number] Date of Birth: Oct 30, 1955  Transition [facility] Care [facility]) CM/SW Contact:  Grandville Lax, LCSWA Phone Number: 09/02/2023, 12:04 PM  Clinical Narrative:    CSW updated that pt is medically stable for D/C home today. HH PT arranged with [facility] Karst, MD placed orders. Cory with [facility] updated on plan for D/C. Neb machine and platform attachment for walker ordered through [employer], Zach with [employer] confirmed delivery to room has been completed. TOC signing off.   Final next level of care: Home w Home Health Services Barriers to Discharge: Barriers Resolved   Patient Goals and CMS Choice Patient states their goals for this hospitalization and ongoing recovery are:: return home CMS Medicare.gov Compare Post Acute Care list provided to:: Patient Choice offered to / list presented to : Patient, Adult Children      Discharge Placement                       Discharge Plan and Services Additional resources added to the After Visit Summary for                  DME Arranged: Nebulizer machine, Other see comment (platform attachment for walker) DME Agency: [employer] Date DME Agency Contacted: 09/02/23   Representative spoke with at DME Agency: Gladys Lamp HH Arranged: PT HH Agency: [facility] Date [facility] Agency Contacted: 09/02/23   Representative spoke with at [facility] Agency: Randel Buss  Social Drivers of Health (SDOH) Interventions SDOH Screenings   Food Insecurity: No Food Insecurity (08/31/2023)  Housing: Low Risk  (08/31/2023)  Transportation Needs: No Transportation Needs (08/31/2023)  Utilities: Not At Risk (08/31/2023)  Depression (PHQ2-9): Low Risk  (08/24/2023)  Social Connections: Moderately Isolated (08/31/2023)  Tobacco Use: High Risk (08/31/2023)     Readmission Risk Interventions    09/01/2023    3:47 PM 08/22/2023    1:18 PM 08/19/2023    1:05 PM  Readmission Risk Prevention Plan   Transportation Screening Complete Complete Complete  PCP or Specialist Appt within 3-5 Days Not Complete Not Complete   HRI or Home Care Consult Complete Complete Complete  Social Work Consult for Recovery Care Planning/Counseling Complete Complete Complete  Palliative Care Screening Not Applicable Not Applicable Not Applicable  Medication Review Oceanographer) Complete Complete Complete

## 2023-09-02 NOTE — Plan of Care (Signed)
  Problem: Acute Rehab OT Goals (only OT should resolve) Goal: Pt. Will Perform Grooming Flowsheets (Taken 09/02/2023 0936) Pt Will Perform Grooming: with modified independence Goal: Pt. Will Perform Lower Body Bathing Flowsheets (Taken 09/02/2023 0936) Pt Will Perform Lower Body Bathing: with modified independence Goal: Pt. Will Perform Lower Body Dressing Flowsheets (Taken 09/02/2023 0936) Pt Will Perform Lower Body Dressing: with modified independence Goal: Pt. Will Perform Toileting-Clothing Manipulation Flowsheets (Taken 09/02/2023 0936) Pt Will Perform Toileting - Clothing Manipulation and hygiene: with modified independence Goal: Pt/Caregiver Will Perform Home Exercise Program Flowsheets (Taken 09/02/2023 [phone]) Pt/caregiver will Perform Home Exercise Program:  Increased ROM  Increased strength  Both right and left upper extremity  Independently  Odean Fester OT, MOT

## 2023-09-02 NOTE — Progress Notes (Signed)
   09/02/23 0016  Vitals  Temp 98.9 F (37.2 C)  Temp Source Oral  BP (!) 188/56  MAP (mmHg) 95  BP Location Right Arm  BP Method Automatic  Patient Position (if appropriate) Lying  Pulse Rate 89  Pulse Rate Source Monitor  Resp 20  MEWS COLOR  MEWS Score Color Green  Oxygen  Therapy  SpO2 92 %  O2 Device Nasal Cannula  MEWS Score  MEWS Temp 0  MEWS Systolic 0  MEWS Pulse 0  MEWS RR 0  MEWS LOC 0  MEWS Score 0   Prn hydralazine  given see MAR, Dr. Adefeso made aware of BP still being elevated even after dose of hydralazine , no new orders

## 2023-09-02 NOTE — Care Management Important Message (Signed)
 Important Message  Patient Details  Name: Veronica Moses MRN: [id number] Date of Birth: [birth year]   Important Message Given:  N/A - LOS <3 / Initial given by admissions     Cristie Mckinney L Lizbett Garciagarcia 09/02/2023, 9:52 AM

## 2023-09-02 NOTE — Hospital Course (Signed)
 68 y.o. female with medical history significant of  COPD, diabetes mellitus, seizure disorder, tobacco abuse who presents to the emergency department due to fall, shortness of breath and confusion.  Patient was admitted for acute hypoxemic respiratory failure secondary to COPD exacerbation and is also noted to have fracture of the left ulnar styloid and orthopedics recommending nonoperative management with wrist brace.  She was noted to have significant hyperglycemia developed overnight likely in the setting of steroid use and is now on insulin  drip which will be transitioned to subcutaneous long-acting and short acting insulin  with initiation of diet.

## 2023-09-02 NOTE — Discharge Summary (Signed)
 Physician Discharge Summary  Evolet Salminen ZOX:[id number] DOB: 08-19-1955 DOA: 08/31/2023  PCP: Wendi Ham, NP Orthopedics: Dr. Ernesta Heading  Admit date: 08/31/2023 Discharge date: 09/02/2023  Admitted From:  Home  Disposition:  Home   Recommendations for Outpatient Follow-up:  Follow up with PCP in 2 weeks Follow up with Dr. Ernesta Heading orthopedics in 1-2 weeks  Discharge Condition: STABLE   CODE STATUS: FULL DIET: resume home carb modified    Brief Hospitalization Summary: Please see all hospital notes, images, labs for full details of the hospitalization. Admission provider HPI:   68 y.o. female with medical history significant of  COPD, diabetes mellitus, seizure disorder, tobacco abuse who presents to the emergency department due to fall, shortness of breath and confusion.  Patient was admitted for acute hypoxemic respiratory failure secondary to COPD exacerbation and is also noted to have fracture of the left ulnar styloid and orthopedics recommending nonoperative management with wrist brace.  She was noted to have significant hyperglycemia developed overnight likely in the setting of steroid use and is now on insulin  drip which will be transitioned to subcutaneous long-acting and short acting insulin  with initiation of diet.   Hospital Course by listed problems addressed  Acute respiratory failure with hypoxemia secondary to COPD exacerbation - RESOLVED Acute COPD exacerbation  Left lower lobe pneumonia  Pt is on room air with pulse ox readings at 99%  Acute metabolic encephalopathy -- resolved now -- pt back to baseline per husband Pt was treated with duo nebs, Mucinex , and ceftriaxone /azithromycin, Solu-Medrol  to prednisone  Encouraged incentive spirometry and flutter valve CT imaging with some left lower lobe pneumonia, treated with azithromycin and ceftriaxone  DC home on oral cefdinir and azithromycin to complete 5 day course of therapy Pt is clinically much improved and eager to go  home.  DC home today.   Home nebulizer and meds ordered.    Fracture of left ulna styloid Left wrist x-ray showed small  minimally displaced fracture at the tip of the ulnar styloid. Orthopedic surgeon (Dr. Ernesta Heading) was consulted and recommended wrist brace and no operative management, outpatient follow up with Dr. Ernesta Heading in 1-2 weeks, wrist splint ordered prior to DC    Fall at home Continue fall precaution [facility] PT/OT   Uncontrolled Type 2 diabetes mellitus with steroid-induced hyperglycemia Hemoglobin A1c on 08/03/2023 close 8.9 Pt was briefly on IV insulin  drip but now back to subQ insulin  Resume home insulin  treatment plan with frequent CBG monitoring   Essential hypertension-uncontrolled Continue IV hydralazine  10 mg every 6 hours for SBP > 170 Continue losartan    Hypoalbuminemia possibly secondary to mild protein calorie malnutrition Albumin 3.1, protein supplement will be provided   Epilepsy Continue Keppra    Insomnia Continue home Xanax    Mixed hyperlipidemia Continue Crestor    Obesity, class I BMI 32.12   Discharge Diagnoses:  Principal Problem:   Acute respiratory failure with hypoxia (HCC) Active Problems:   Acute exacerbation of chronic obstructive pulmonary disease (COPD) (HCC)   Fall at home, initial encounter   Seizure (HCC)   Hypoalbuminemia due to protein-calorie malnutrition (HCC)   Essential hypertension   Type 2 diabetes mellitus with hyperglycemia (HCC)   Insomnia   Mixed hyperlipidemia   Displaced fracture of left ulna styloid process, initial encounter for closed fracture   Discharge Instructions: Discharge Instructions     For home use only DME Nebulizer machine   Complete by: As directed    Patient needs a nebulizer to treat with the following condition: COPD with acute exacerbation (  HCC)   Length of Need: Lifetime   Additional equipment included:  Administration kit Filter        Allergies as of 09/02/2023       Reactions    Demerol Other (See Comments)   hallucinations   Depakote  [divalproex  Sodium] Other (See Comments)   Sore/blisters   Dilaudid  [hydromorphone  Hcl] Nausea And Vomiting   Doxycycline  Other (See Comments)   Blistered   Valproic Acid  Rash        Medication List     TAKE these medications    acetaminophen  325 MG tablet Commonly known as: TYLENOL  Take 325 mg by mouth every 6 (six) hours as needed for mild pain or moderate pain.   albuterol  108 (90 Base) MCG/ACT inhaler Commonly known as: VENTOLIN  HFA Inhale 2 puffs into the lungs every 6 (six) hours as needed for wheezing or shortness of breath.   alendronate 70 MG tablet Commonly known as: FOSAMAX Take 70 mg by mouth every Saturday.   ALPRAZolam  0.5 MG tablet Commonly known as: XANAX  Take 0.5 mg by mouth See admin instructions. Take 1 tablet if needed daily and 2 tablets every evening   aspirin  81 MG chewable tablet Chew 1 tablet (81 mg total) by mouth 2 (two) times daily AND 1 tablet (81 mg total) daily.   azithromycin 250 MG tablet Commonly known as: ZITHROMAX Take 1 tablet (250 mg total) by mouth daily for 3 days. Start taking on: September 03, 2023   cefdinir 300 MG capsule Commonly known as: OMNICEF Take 1 capsule (300 mg total) by mouth 2 (two) times daily for 4 days.   dextromethorphan  30 MG/5ML liquid Commonly known as: Delsym  Take 5 mLs (30 mg total) by mouth 2 (two) times daily as needed for cough.   donepezil  10 MG tablet Commonly known as: ARICEPT  Take 1 tablet (10 mg total) by mouth at bedtime.   escitalopram  20 MG tablet Commonly known as: LEXAPRO  Take 20 mg by mouth daily.   ferrous sulfate  325 (65 FE) MG tablet Take 1 tablet (325 mg total) by mouth daily with breakfast.   gabapentin  300 MG capsule Commonly known as: NEURONTIN  Take 1 capsule by mouth daily.   glipiZIDE 5 MG 24 hr tablet Commonly known as: GLUCOTROL XL Take 5 mg by mouth daily with breakfast.   insulin  glargine 100 UNIT/ML  Solostar Pen Commonly known as: LANTUS  Inject 20 Units into the skin daily.   ipratropium-albuterol  0.5-2.5 (3) MG/3ML Soln Commonly known as: DUONEB Take 3 mLs by nebulization every 4 (four) hours as needed (wheezing, shortness of breath).   levETIRAcetam  1000 MG tablet Commonly known as: KEPPRA  Take 1 tablet (1,000 mg total) by mouth 2 (two) times daily.   losartan  50 MG tablet Commonly known as: COZAAR  Take 1 tablet (50 mg total) by mouth daily.   mirtazapine 7.5 MG tablet Commonly known as: REMERON Take 7.5 mg by mouth at bedtime.   NovoLOG  FlexPen 100 UNIT/ML FlexPen Generic drug: insulin  aspart Please use the hospital sliding scale provided for you What changed:  how much to take how to take this when to take this reasons to take this additional instructions   oxyCODONE  5 MG immediate release tablet Commonly known as: Oxy IR/ROXICODONE  Take 1 tablet (5 mg total) by mouth every 6 (six) hours as needed for severe pain (pain score 7-10).   polyethylene glycol 17 g packet Commonly known as: MiraLax  Take 17 g by mouth daily as needed for mild constipation or moderate constipation.  potassium chloride  10 MEQ tablet Commonly known as: KLOR-CON  Take 10 mEq by mouth daily.   predniSONE  20 MG tablet Commonly known as: DELTASONE  Take 1 tablet (20 mg total) by mouth daily with breakfast for 4 days. Start taking on: September 03, 2023   rosuvastatin  20 MG tablet Commonly known as: CRESTOR  Take 20 mg by mouth daily.   senna 8.6 MG Tabs tablet Commonly known as: SENOKOT Take 1 tablet (8.6 mg total) by mouth daily as needed for mild constipation or moderate constipation.   varenicline  1 MG tablet Commonly known as: CHANTIX  Take 0.5 tablets (0.5 mg total) by mouth daily for 3 days, THEN 0.5 tablets (0.5 mg total) 2 (two) times daily for 4 days, THEN 1 tablet (1 mg total) 2 (two) times daily. Start taking on: August 22, 2023               Durable Medical Equipment   (From admission, onward)           Start     Ordered   09/02/23 0000  For home use only DME Nebulizer machine       Question Answer Comment  Patient needs a nebulizer to treat with the following condition COPD with acute exacerbation (HCC)   Length of Need Lifetime   Additional equipment included Administration kit   Additional equipment included Filter      09/02/23 1055   09/01/23 1556  For home use only DME Walker rolling  Once       Comments: Add Left Platform attachment  Question Answer Comment  Walker: With 5 Inch Wheels   Patient needs a walker to treat with the following condition Fracture of left wrist      09/01/23 1558            Follow-up Information     Wendi Ham, NP. Schedule an appointment as soon as possible for a visit in 1 week(s).   Specialty: Family Medicine Why: Hospital Follow Up Contact information: [address] [place] [address] [phone]                Allergies  Allergen Reactions   Demerol Other (See Comments)    hallucinations   Depakote  [Divalproex  Sodium] Other (See Comments)    Sore/blisters   Dilaudid  [Hydromorphone  Hcl] Nausea And Vomiting   Doxycycline  Other (See Comments)    Blistered   Valproic Acid  Rash   Allergies as of 09/02/2023       Reactions   Demerol Other (See Comments)   hallucinations   Depakote  [divalproex  Sodium] Other (See Comments)   Sore/blisters   Dilaudid  [hydromorphone  Hcl] Nausea And Vomiting   Doxycycline  Other (See Comments)   Blistered   Valproic Acid  Rash        Medication List     TAKE these medications    acetaminophen  325 MG tablet Commonly known as: TYLENOL  Take 325 mg by mouth every 6 (six) hours as needed for mild pain or moderate pain.   albuterol  108 (90 Base) MCG/ACT inhaler Commonly known as: VENTOLIN  HFA Inhale 2 puffs into the lungs every 6 (six) hours as needed for wheezing or shortness of breath.   alendronate 70 MG tablet Commonly  known as: FOSAMAX Take 70 mg by mouth every Saturday.   ALPRAZolam  0.5 MG tablet Commonly known as: XANAX  Take 0.5 mg by mouth See admin instructions. Take 1 tablet if needed daily and 2 tablets every evening   aspirin  81 MG chewable tablet  Chew 1 tablet (81 mg total) by mouth 2 (two) times daily AND 1 tablet (81 mg total) daily.   azithromycin 250 MG tablet Commonly known as: ZITHROMAX Take 1 tablet (250 mg total) by mouth daily for 3 days. Start taking on: September 03, 2023   cefdinir 300 MG capsule Commonly known as: OMNICEF Take 1 capsule (300 mg total) by mouth 2 (two) times daily for 4 days.   dextromethorphan  30 MG/5ML liquid Commonly known as: Delsym  Take 5 mLs (30 mg total) by mouth 2 (two) times daily as needed for cough.   donepezil  10 MG tablet Commonly known as: ARICEPT  Take 1 tablet (10 mg total) by mouth at bedtime.   escitalopram  20 MG tablet Commonly known as: LEXAPRO  Take 20 mg by mouth daily.   ferrous sulfate  325 (65 FE) MG tablet Take 1 tablet (325 mg total) by mouth daily with breakfast.   gabapentin  300 MG capsule Commonly known as: NEURONTIN  Take 1 capsule by mouth daily.   glipiZIDE 5 MG 24 hr tablet Commonly known as: GLUCOTROL XL Take 5 mg by mouth daily with breakfast.   insulin  glargine 100 UNIT/ML Solostar Pen Commonly known as: LANTUS  Inject 20 Units into the skin daily.   ipratropium-albuterol  0.5-2.5 (3) MG/3ML Soln Commonly known as: DUONEB Take 3 mLs by nebulization every 4 (four) hours as needed (wheezing, shortness of breath).   levETIRAcetam  1000 MG tablet Commonly known as: KEPPRA  Take 1 tablet (1,000 mg total) by mouth 2 (two) times daily.   losartan  50 MG tablet Commonly known as: COZAAR  Take 1 tablet (50 mg total) by mouth daily.   mirtazapine 7.5 MG tablet Commonly known as: REMERON Take 7.5 mg by mouth at bedtime.   NovoLOG  FlexPen 100 UNIT/ML FlexPen Generic drug: insulin  aspart Please use the hospital sliding  scale provided for you What changed:  how much to take how to take this when to take this reasons to take this additional instructions   oxyCODONE  5 MG immediate release tablet Commonly known as: Oxy IR/ROXICODONE  Take 1 tablet (5 mg total) by mouth every 6 (six) hours as needed for severe pain (pain score 7-10).   polyethylene glycol 17 g packet Commonly known as: MiraLax  Take 17 g by mouth daily as needed for mild constipation or moderate constipation.   potassium chloride  10 MEQ tablet Commonly known as: KLOR-CON  Take 10 mEq by mouth daily.   predniSONE  20 MG tablet Commonly known as: DELTASONE  Take 1 tablet (20 mg total) by mouth daily with breakfast for 4 days. Start taking on: September 03, 2023   rosuvastatin  20 MG tablet Commonly known as: CRESTOR  Take 20 mg by mouth daily.   senna 8.6 MG Tabs tablet Commonly known as: SENOKOT Take 1 tablet (8.6 mg total) by mouth daily as needed for mild constipation or moderate constipation.   varenicline  1 MG tablet Commonly known as: CHANTIX  Take 0.5 tablets (0.5 mg total) by mouth daily for 3 days, THEN 0.5 tablets (0.5 mg total) 2 (two) times daily for 4 days, THEN 1 tablet (1 mg total) 2 (two) times daily. Start taking on: August 22, 2023               Durable Medical Equipment  (From admission, onward)           Start     Ordered   09/02/23 0000  For home use only DME Nebulizer machine       Question Answer Comment  Patient needs a nebulizer to  treat with the following condition COPD with acute exacerbation (HCC)   Length of Need Lifetime   Additional equipment included Administration kit   Additional equipment included Filter      09/02/23 1055   09/01/23 1556  For home use only DME Walker rolling  Once       Comments: Add Left Platform attachment  Question Answer Comment  Walker: With 5 Inch Wheels   Patient needs a walker to treat with the following condition Fracture of left wrist      09/01/23 1558             Procedures/Studies: CT Angio Chest PE W and/or Wo Contrast Result Date: 08/31/2023 CLINICAL DATA:  Marvell Slider in bathtub short of breath EXAM: CT ANGIOGRAPHY CHEST WITH CONTRAST TECHNIQUE: Multidetector CT imaging of the chest was performed using the standard protocol during bolus administration of intravenous contrast. Multiplanar CT image reconstructions and MIPs were obtained to evaluate the vascular anatomy. RADIATION DOSE REDUCTION: This exam was performed according to the departmental dose-optimization program which includes automated exposure control, adjustment of the mA and/or kV according to patient size and/or use of iterative reconstruction technique. CONTRAST:  75mL OMNIPAQUE  IOHEXOL  350 MG/ML SOLN COMPARISON:  Chest x-ray 08/31/2023, chest CT 08/19/2023 FINDINGS: Cardiovascular: Satisfactory opacification of the pulmonary arteries to the segmental level. No evidence of pulmonary embolism. Mild cardiomegaly. No pericardial effusion. Mild aortic atherosclerosis. No aneurysm or dissection. Coronary vascular calcification. Mediastinum/Nodes: No enlarged mediastinal, hilar, or axillary lymph nodes. Thyroid  gland, trachea, and esophagus demonstrate no significant findings. Lungs/Pleura: Atelectasis versus minimal infiltrate left lung base. Patchy mucous plugging in left lower lobe. Upper Abdomen: No acute finding Musculoskeletal: No acute osseous abnormality. Review of the MIP images confirms the above findings. IMPRESSION: 1. Negative for acute pulmonary embolus or aortic dissection. 2. Mild cardiomegaly. 3. Atelectasis versus minimal infiltrate in the left lung base. Patchy mucous plugging in the left lower lobe. 4. Aortic atherosclerosis. Aortic Atherosclerosis (ICD10-I70.0). Electronically Signed   By: Esmeralda Hedge M.D.   On: 08/31/2023 18:33   MR BRAIN WO CONTRAST Result Date: 08/31/2023 CLINICAL DATA:  Delirium. EXAM: MRI HEAD WITHOUT CONTRAST TECHNIQUE: Multiplanar, multiecho pulse  sequences of the brain and surrounding structures were obtained without intravenous contrast. COMPARISON:  CT of the head dated August 31, 2023 and MRI of the brain dated March 16, 2023. FINDINGS: Brain: There is moderate cerebral volume loss present. There are confluent zones of increased T2 signal present within the periventricular white matter, particularly within the frontal lobes bilaterally. There is also moderate subcortical white matter disease. There is no evidence of restricted diffusion to indicate acute or recent infarction. There is no evidence of hemorrhage, mass or hydrocephalus. Vascular: Normal vascular flow voids. Skull and upper cervical spine: Intact and normal in signal intensity. No lesions are evident. Sinuses/Orbits: Unremarkable. Other: None. IMPRESSION: 1. Age-related atrophy and moderately advanced cerebral white matter disease, particularly involving the periventricular white matter. No apparent acute process. Electronically Signed   By: Maribeth Shivers M.D.   On: 08/31/2023 16:32   DG Pelvis Portable Result Date: 08/31/2023 CLINICAL DATA:  Marvell Slider last night, recent hip replacement EXAM: PORTABLE PELVIS 1-2 VIEWS COMPARISON:  08/20/2023 FINDINGS: Frontal view of the pelvis includes both hips. Right hip hemiarthroplasty is again identified in the expected position without evidence of complication. The distal margin of the femoral component is excluded by collimation. Stable mild left hip osteoarthritis. No acute displaced fractures. The sacroiliac joints are normal. Skin staples overlie the right  hip. IMPRESSION: 1. No acute displaced fracture. 2. Stable right hip hemiarthroplasty, without evidence of acute complication. Distal margin of the femoral component is excluded by collimation. Electronically Signed   By: Bobbye Burrow M.D.   On: 08/31/2023 15:25   DG Wrist Complete Left Result Date: 08/31/2023 CLINICAL DATA:  Marvell Slider, left wrist pain EXAM: LEFT WRIST - COMPLETE 3+ VIEW  COMPARISON:  None Available. FINDINGS: Frontal, oblique, lateral, and ulnar deviated views of the left wrist are obtained. Bones are diffusely osteopenic. There is a minimally displaced fracture at the tip of the ulnar styloid, with near anatomic alignment. No other acute bony abnormalities. Mild osteoarthritis at the radiocarpal joint and within the radial aspect of the carpus. Dorsal soft tissue swelling. IMPRESSION: 1. Small minimally displaced fracture at the tip of the ulnar styloid. 2. Dorsal soft tissue swelling. 3. Diffuse osteopenia. 4. Mild osteoarthritis. Electronically Signed   By: Bobbye Burrow M.D.   On: 08/31/2023 15:24   DG Chest 1 View Result Date: 08/31/2023 CLINICAL DATA:  Marvell Slider last night, hypoxia EXAM: CHEST  1 VIEW COMPARISON:  08/18/2023 FINDINGS: Single frontal view of the chest demonstrates a stable cardiac silhouette. No airspace disease, effusion, or pneumothorax. No acute displaced fractures. IMPRESSION: 1. No acute intrathoracic process. Electronically Signed   By: Bobbye Burrow M.D.   On: 08/31/2023 15:23   CT Head Wo Contrast Result Date: 08/31/2023 CLINICAL DATA:  Marvell Slider last night, short of breath, hypoxia EXAM: CT HEAD WITHOUT CONTRAST TECHNIQUE: Contiguous axial images were obtained from the base of the skull through the vertex without intravenous contrast. RADIATION DOSE REDUCTION: This exam was performed according to the departmental dose-optimization program which includes automated exposure control, adjustment of the mA and/or kV according to patient size and/or use of iterative reconstruction technique. COMPARISON:  03/10/2020 FINDINGS: Brain: Confluent hypodensities throughout the periventricular white matter, as well as focal hypodensities within the right side of the pons and bilateral basal ganglia, compatible with chronic small vessel ischemic changes. No signs of acute infarct or hemorrhage. Lateral ventricles and remaining midline structures are unremarkable. No  acute extra-axial fluid collections. No mass effect. Vascular: No hyperdense vessel or unexpected calcification. Skull: Normal. Negative for fracture or focal lesion. Sinuses/Orbits: No acute finding. Other: None. IMPRESSION: 1. No acute intracranial process. Electronically Signed   By: Bobbye Burrow M.D.   On: 08/31/2023 15:22   DG HIP UNILAT WITH PELVIS 2-3 VIEWS RIGHT Result Date: 08/20/2023 CLINICAL DATA:  Status post right hip surgery. EXAM: DG HIP (WITH OR WITHOUT PELVIS) 2-3V RIGHT COMPARISON:  Fluoroscopic images of same day. FINDINGS: Right hip prosthesis is noted in grossly good position. Expected postoperative changes seen in the surrounding soft tissues. IMPRESSION: Status post right hip arthroplasty. Electronically Signed   By: Rosalene Colon M.D.   On: 08/20/2023 15:54   DG HIP UNILAT WITH PELVIS 2-3 VIEWS RIGHT Result Date: 08/20/2023 CLINICAL DATA:  Hemiarthroplasty of right hip. EXAM: DG HIP (WITH OR WITHOUT PELVIS) 2-3V RIGHT COMPARISON:  None Available. FINDINGS: Six fluoroscopic spot views of the pelvis and right hip obtained in the operating room. Sequential images during hip arthroplasty. Fluoroscopy time 19 seconds. Dose 2.506 mGy. IMPRESSION: Intraoperative fluoroscopy during right hip arthroplasty. Electronically Signed   By: Chadwick Colonel M.D.   On: 08/20/2023 15:05   DG C-Arm 1-60 Min-No Report Result Date: 08/20/2023 Fluoroscopy was utilized by the requesting physician.  No radiographic interpretation.   DG C-Arm 1-60 Min-No Report Result Date: 08/20/2023 Fluoroscopy was utilized  by the requesting physician.  No radiographic interpretation.   CT Angio Chest PE W and/or Wo Contrast Result Date: 08/19/2023 CLINICAL DATA:  Fall, right femoral neck fracture, positive D-dimer EXAM: CT ANGIOGRAPHY CHEST WITH CONTRAST TECHNIQUE: Multidetector CT imaging of the chest was performed using the standard protocol during bolus administration of intravenous contrast. Multiplanar CT image  reconstructions and MIPs were obtained to evaluate the vascular anatomy. RADIATION DOSE REDUCTION: This exam was performed according to the departmental dose-optimization program which includes automated exposure control, adjustment of the mA and/or kV according to patient size and/or use of iterative reconstruction technique. CONTRAST:  75mL OMNIPAQUE  IOHEXOL  350 MG/ML SOLN COMPARISON:  Chest radiograph dated 08/18/2023 FINDINGS: Cardiovascular: Satisfactory opacification the bilateral pulmonary arteries to the segmental level. No evidence of pulmonary embolism. Although not tailored for evaluation of the thoracic aorta, there is no evidence of thoracic aortic aneurysm or dissection. Mild atherosclerotic calcifications of the aortic arch. Mild cardiomegaly.  No pericardial effusion. Moderate three-vessel coronary atherosclerosis. Mediastinum/Nodes: No suspicious mediastinal lymphadenopathy. Visualized thyroid  is unremarkable. Lungs/Pleura: Evaluation of the lung parenchyma is constrained by respiratory motion. Within that constraint, there are no suspicious pulmonary nodules. Mild patchy opacities in the dependent upper and lower lobes, likely atelectasis. Additional platelike atelectasis inferiorly in the right upper lobe. No focal consolidation. No pleural effusion or pneumothorax. Upper Abdomen: Visualized upper abdomen is notable for prior cholecystectomy and vascular calcifications. 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 Musculoskeletal: Visualized osseous structures are within normal limits. Review of the MIP images confirms the above findings. IMPRESSION: No evidence of pulmonary embolism. Mild cardiomegaly.  Scattered atelectasis. Aortic Atherosclerosis (ICD10-I70.0). Electronically Signed   By: Zadie Herter M.D.   On: 08/19/2023 02:18   CT Hip Right Wo Contrast Result Date: 08/18/2023 CLINICAL DATA:  Trauma. EXAM: CT OF THE RIGHT HIP WITHOUT CONTRAST TECHNIQUE: Multidetector CT  imaging of the right hip was performed according to the standard protocol. Multiplanar CT image reconstructions were also generated. RADIATION DOSE REDUCTION: This exam was performed according to the departmental dose-optimization program which includes automated exposure control, adjustment of the mA and/or kV according to patient size and/or use of iterative reconstruction technique. COMPARISON:  X-ray same day FINDINGS: Bones/Joint/Cartilage The bones are osteopenic. There is a mildly impacted transverse fracture of the right femoral neck. There is no dislocation. Joint effusion is present. Ligaments Suboptimally assessed by CT. Muscles and Tendons No focal hematoma. Soft tissues No focal hematoma or fluid collection. IMPRESSION: Mildly impacted transverse fracture of the right femoral neck. Electronically Signed   By: Tyron Gallon M.D.   On: 08/18/2023 21:33   CT Lumbar Spine Wo Contrast Result Date: 08/18/2023 CLINICAL DATA:  Low back pain EXAM: CT LUMBAR SPINE WITHOUT CONTRAST TECHNIQUE: Multidetector CT imaging of the lumbar spine was performed without intravenous contrast administration. Multiplanar CT image reconstructions were also generated. RADIATION DOSE REDUCTION: This exam was performed according to the departmental dose-optimization program which includes automated exposure control, adjustment of the mA and/or kV according to patient size and/or use of iterative reconstruction technique. COMPARISON:  None Available. FINDINGS: Segmentation: 5 lumbar type vertebrae. Alignment: Normal. Vertebrae: No acute fracture or focal pathologic process. Paraspinal and other soft tissues: Negative. Surgical changes of left nephrectomy noted. Disc levels: Intervertebral disc heights are preserved. No significant neuroforaminal narrowing. Mild to moderate facet arthrosis, most severe at L2-3 and L4-5. Mild multifactorial central canal stenosis at L2-L5,. The osseous structures are diffusely osteopenic IMPRESSION:  1. No acute osseous abnormality of  the lumbar spine. 2. Mild to moderate facet arthrosis, most severe at L2-3 and L4-5. 3. Mild multifactorial central canal stenosis at L2-L5. Electronically Signed   By: Worthy Heads M.D.   On: 08/18/2023 20:29   DG Chest Portable 1 View Result Date: 08/18/2023 CLINICAL DATA:  Status post fall, right hip fracture. EXAM: PORTABLE CHEST 1 VIEW COMPARISON:  08/03/2023 FINDINGS: Stable cardiomegaly. Unchanged mediastinal contours. No acute airspace disease, pulmonary edema, large pleural effusion or pneumothorax. On limited assessment, no acute osseous findings. IMPRESSION: Stable cardiomegaly. No acute findings. Electronically Signed   By: Chadwick Colonel M.D.   On: 08/18/2023 19:35   DG Hip Unilat W or Wo Pelvis 2-3 Views Right Result Date: 08/18/2023 CLINICAL DATA:  Right hip pain after fall.  Unable to bear weight. EXAM: DG HIP (WITH OR WITHOUT PELVIS) 2-3V RIGHT COMPARISON:  None Available. FINDINGS: Impacted right femoral neck fracture. No significant displacement. The femoral head remains seated. The bones are diffusely under mineralized. Pubic rami are intact. Pubic symphysis and sacroiliac joints are congruent. IMPRESSION: Impacted right femoral neck fracture. Electronically Signed   By: Chadwick Colonel M.D.   On: 08/18/2023 19:34   DG Chest 2 View Result Date: 08/04/2023 CLINICAL DATA:  eval for epigastric pain EXAM: CHEST - 2 VIEW COMPARISON:  Chest x-ray 05/01/2022 FINDINGS: The heart and mediastinal contours are unchanged. Atherosclerotic plaque. No focal consolidation. No pulmonary edema. No pleural effusion. No pneumothorax. No acute osseous abnormality. IMPRESSION: 1. No active cardiopulmonary disease. 2.  Aortic Atherosclerosis (ICD10-I70.0). Electronically Signed   By: Morgane  Naveau M.D.   On: 08/04/2023 00:00   CT Renal Stone Study Result Date: 08/03/2023 CLINICAL DATA:  Epigastric pain EXAM: CT ABDOMEN AND PELVIS WITHOUT CONTRAST TECHNIQUE:  Multidetector CT imaging of the abdomen and pelvis was performed following the standard protocol without IV contrast. RADIATION DOSE REDUCTION: This exam was performed according to the departmental dose-optimization program which includes automated exposure control, adjustment of the mA and/or kV according to patient size and/or use of iterative reconstruction technique. COMPARISON:  CT 11/28/2021 FINDINGS: Lower chest: Lung bases demonstrate no acute airspace disease. Coronary vascular calcification. Hepatobiliary: Cholecystectomy. Similar mild enlargement of common bile duct. Pancreas: Unremarkable. No pancreatic ductal dilatation or surrounding inflammatory changes. Spleen: Normal in size without focal abnormality. Adrenals/Urinary Tract: Adrenal glands are normal. Status post left nephrectomy. There is mild right hydronephrosis and hydroureter, but no obstructing stone. Distended urinary bladder. Stomach/Bowel: Stomach nonenlarged. No dilated small bowel. No acute bowel wall thickening. Vascular/Lymphatic: Aortic atherosclerosis. No enlarged abdominal or pelvic lymph nodes. Reproductive: Status post hysterectomy. No adnexal masses. Other: Negative for pelvic effusion or free air. Clips in the left pelvis and retroperitoneum Musculoskeletal: No acute or suspicious osseous abnormality IMPRESSION: 1. Mild right hydronephrosis and hydroureter, but no obstructing stone. There is moderate urinary bladder distention. 2. Status post left nephrectomy. 3. Aortic atherosclerosis. Aortic Atherosclerosis (ICD10-I70.0). Electronically Signed   By: Esmeralda Hedge M.D.   On: 08/03/2023 23:47     Subjective: Pt reports that she is breathing much better, she is eager to go home today  Discharge Exam: Vitals:   09/02/23 0545 09/02/23 0720  BP: (!) 162/52   Pulse: 79   Resp:    Temp:    SpO2:  94%   Vitals:   09/02/23 0327 09/02/23 0511 09/02/23 0545 09/02/23 0720  BP: (!) 181/50 (!) 178/62 (!) 162/52   Pulse: 96  78 79   Resp: 19     Temp: 98.4 F (  36.9 C)     TempSrc: Oral     SpO2: 93%   94%  Weight:      Height:        General: Pt is alert, awake, not in acute distress Cardiovascular: RRR, S1/S2 +, no rubs, no gallops Respiratory: good air movement bilaterally, rare wheezing, no rhonchi Abdominal: Soft, NT, ND, bowel sounds + Extremities: no edema, no cyanosis   The results of significant diagnostics from this hospitalization (including imaging, microbiology, ancillary and laboratory) are listed below for reference.     Microbiology: No results found for this or any previous visit (from the past 240 hours).   Labs: BNP (last 3 results) No results for input(s): BNP in the last 8760 hours. Basic Metabolic Panel: Recent Labs  Lab 08/31/23 1341 09/01/23 0139 09/01/23 0632 09/02/23 0427  NA 138 135 136 141  K 3.5 3.9 3.6 3.1*  CL 96* 95* 98 102  CO2 31 26 28 28   GLUCOSE 278* 533* 432* 280*  BUN 11 16 18 18   CREATININE 0.93 0.96 0.90 0.84  CALCIUM  9.8 9.1 9.0 8.9  MG  --  1.3*  --  1.4*  PHOS  --  4.7*  --   --    Liver Function Tests: Recent Labs  Lab 08/31/23 1341 09/01/23 0139  AST 37 32  ALT 28 25  ALKPHOS 39 35*  BILITOT 0.3 0.6  PROT 6.7 6.1*  ALBUMIN 3.1* 2.8*   Recent Labs  Lab 08/31/23 1341  LIPASE 21   No results for input(s): AMMONIA in the last 168 hours. CBC: Recent Labs  Lab 08/31/23 1341 09/01/23 0139 09/02/23 0427  WBC 10.4 6.5 12.3*  HGB 12.1 10.4* 10.0*  HCT 38.5 31.7* 31.5*  MCV 96.0 94.3 95.5  PLT 303 278 318   Cardiac Enzymes: No results for input(s): CKTOTAL, CKMB, CKMBINDEX, TROPONINI in the last 168 hours. BNP: Invalid input(s): POCBNP CBG: Recent Labs  Lab 09/01/23 1942 09/02/23 0014 09/02/23 0328 09/02/23 0520 09/02/23 0742  GLUCAP 168* 169* 270* 290* 294*   D-Dimer No results for input(s): DDIMER in the last 72 hours. Hgb A1c No results for input(s): HGBA1C in the last 72 hours. Lipid  Profile No results for input(s): CHOL, HDL, LDLCALC, TRIG, CHOLHDL, LDLDIRECT in the last 72 hours. Thyroid  function studies No results for input(s): TSH, T4TOTAL, T3FREE, THYROIDAB in the last 72 hours.  Invalid input(s): FREET3 Anemia work up No results for input(s): VITAMINB12, FOLATE, FERRITIN, TIBC, IRON , RETICCTPCT in the last 72 hours. Urinalysis    Component Value Date/Time   COLORURINE YELLOW 08/31/2023 1554   APPEARANCEUR CLEAR 08/31/2023 1554   LABSPEC 1.017 08/31/2023 1554   PHURINE 6.0 08/31/2023 1554   GLUCOSEU NEGATIVE 08/31/2023 1554   HGBUR NEGATIVE 08/31/2023 1554   BILIRUBINUR NEGATIVE 08/31/2023 1554   KETONESUR NEGATIVE 08/31/2023 1554   PROTEINUR NEGATIVE 08/31/2023 1554   UROBILINOGEN 0.2 07/13/2014 1507   NITRITE POSITIVE (A) 08/31/2023 1554   LEUKOCYTESUR NEGATIVE 08/31/2023 1554   Sepsis Labs Recent Labs  Lab 08/31/23 1341 09/01/23 0139 09/02/23 0427  WBC 10.4 6.5 12.3*   Microbiology No results found for this or any previous visit (from the past 240 hours).  Time coordinating discharge: 36 mins   SIGNED:  Faustino Hook, MD  [facility] 09/02/2023, 11:02 AM How to contact the [facility] Attending or Consulting provider 7A - 7P or covering provider during after hours 7P -7A, for this patient?  Check the care team in [facility] and look for  a) attending/consulting [facility] provider listed and b) the [facility] team listed Log into [email or web address] and use [place]'s universal password to access. If you do not have the password, please contact the hospital operator. Locate the TR[facility] provider you are looking for under [facility] and page to a number that you can be directly reached. If you still have difficulty reaching the provider, please page the [facility] (Director on Call) for the Hospitalists listed on amion for assistance.

## 2023-09-02 NOTE — Progress Notes (Signed)
 Dr. Adefeso made aware of patients BG of 270, no new orders obtained

## 2023-09-02 NOTE — Evaluation (Signed)
 Occupational Therapy Evaluation Patient Details Name: Veronica Moses MRN: [id number] DOB: [birth year] Today's Date: 09/02/2023   History of Present Illness   Veronica Moses is a 68 y.o. female with medical history significant of  COPD, diabetes mellitus, seizure disorder, tobacco abuse who presents to the emergency department due to fall, shortness of breath and confusion.  Patient was unable to provide history, history was obtained from ED PA and ED medical record.  Per report, patient fell when she went to use the spare bathroom at home.  Husband heard a loud thud and quickly went to the bathroom and found patient on the floor.  She admitted to blunt head trauma and left wrist pain.  She states that she was going to the bathroom, this was unusual, since she usually uses the bathroom attached to her bedroom.   She also complains of recent unproductive cough, but she denies fever, chills, nausea or vomiting.  O2 sat at physical therapy today was noted to be in the 80s.  EMS was activated and patient was sent to the ED for further evaluation.  In the ED, patient was unable to identify daughter which was a change from baseline dementia.     She was recently admitted from 6/30 through 6/7 due to right hip fracture (displaced femoral neck fracture) s/p right hip hemiarthroplasty.     Clinical Impressions Pt agreeable to OT and PT co-evaluation/treatment. CGA for bed mobility and transfers with platform walker and holding IV pole. R UE generally weak. Good use of L UE on platform walker. L UE splinted at this time. Set up to min A for most ADL's per clinical judgement. Pt left on the toilet with husband present to assist. Pt will benefit from continued OT in the hospital and recommended venue below to increase strength, balance, and endurance for safe ADL's.        If plan is discharge home, recommend the following:   A little help with walking and/or transfers;A little help with  bathing/dressing/bathroom;Assistance with cooking/housework;Assist for transportation;Help with stairs or ramp for entrance     Functional Status Assessment   Patient has had a recent decline in their functional status and demonstrates the ability to make significant improvements in function in a reasonable and predictable amount of time.     Equipment Recommendations   None recommended by OT             Precautions/Restrictions   Precautions Precautions: Fall Recall of Precautions/Restrictions: Impaired Restrictions Weight Bearing Restrictions Per Provider Order: Yes RLE Weight Bearing Per Provider Order: Weight bearing as tolerated     Mobility Bed Mobility Overal bed mobility: Needs Assistance Bed Mobility: Supine to Sit     Supine to sit: Contact guard     General bed mobility comments: labored movement    Transfers Overall transfer level: Needs assistance Equipment used: Left platform walker Transfers: Sit to/from Stand, Bed to chair/wheelchair/BSC Sit to Stand: Contact guard assist     Step pivot transfers: Contact guard assist     General transfer comment: good use of L platform walker; no phyisical assist needed      Balance Overall balance assessment: Needs assistance Sitting-balance support: No upper extremity supported, Feet supported Sitting balance-Leahy Scale: Good Sitting balance - Comments: fair/good seated at EOB   Standing balance support: During functional activity, Single extremity supported Standing balance-Leahy Scale: Fair Standing balance comment: using L platform walker  ADL either performed or assessed with clinical judgement   ADL Overall ADL's : Needs assistance/impaired     Grooming: Set up;Minimal assistance;Sitting   Upper Body Bathing: Set up;Minimal assistance;Sitting   Lower Body Bathing: Set up;Sitting/lateral leans   Upper Body Dressing : Set up;Minimal  assistance;Sitting   Lower Body Dressing: Set up;Sitting/lateral leans Lower Body Dressing Details (indicate cue type and reason): Doff and don sock without assist in the recliner. Toilet Transfer: Contact guard assist;Stand-pivot;Ambulation (platform walker) Toilet Transfer Details (indicate cue type and reason): simulated via EOB to chair and ambulation in the hall with platform walker; pt also able to ambualte to the toilet holding the IV pole with CGA. Toileting- Clothing Manipulation and Hygiene: Minimal assistance;Sitting/lateral lean       Functional mobility during ADLs: Contact guard assist (platform walker)       Vision Baseline Vision/History: 1 Wears glasses Ability to See in Adequate Light: 1 Impaired Patient Visual Report: No change from baseline Vision Assessment?: Wears glasses for reading     Perception Perception: Not tested       Praxis Praxis: Not tested       Pertinent Vitals/Pain Pain Assessment Pain Assessment: Faces Faces Pain Scale: Hurts little more Pain Location: L wrist; R arm, R hip Pain Descriptors / Indicators: Discomfort, Sore, Grimacing Pain Intervention(s): Monitored during session, Repositioned     Extremity/Trunk Assessment Upper Extremity Assessment Upper Extremity Assessment: RUE deficits/detail;LUE deficits/detail RUE Deficits / Details: Generally weak LUE Deficits / Details: splinted at wrist and forearm; good functional use of platform walker   Lower Extremity Assessment Lower Extremity Assessment: Defer to PT evaluation   Cervical / Trunk Assessment Cervical / Trunk Assessment: Normal   Communication Communication Communication: No apparent difficulties   Cognition Arousal: Alert Behavior During Therapy: WFL for tasks assessed/performed Cognition: No apparent impairments                               Following commands: Intact Following commands impaired: Only follows one step commands consistently      Cueing  General Comments   Cueing Techniques: Verbal cues;Tactile cues                 Home Living Family/patient expects to be discharged to:: Private residence Living Arrangements: Spouse/significant other Available Help at Discharge: Family Type of Home: House Home Access: Stairs to enter Secretary/administrator of Steps: 5 Entrance Stairs-Rails: Left;Right Home Layout: One level     Bathroom Shower/Tub: Producer, television/film/video: Handicapped height Bathroom Accessibility: Yes How Accessible: Accessible via walker Home Equipment: Agricultural consultant (2 wheels);Cane - single point          Prior Functioning/Environment Prior Level of Function : Needs assist       Physical Assist : ADLs (physical)     Mobility Comments: Community ambulator without AD; does not drive ADLs Comments: Assisted by husband    OT Problem List: Decreased strength;Decreased range of motion;Decreased activity tolerance;Impaired balance (sitting and/or standing);Decreased knowledge of use of DME or AE;Decreased knowledge of precautions;Pain   OT Treatment/Interventions: Self-care/ADL training;Therapeutic exercise;DME and/or AE instruction;Therapeutic activities;Patient/family education;Balance training      OT Goals(Current goals can be found in the care plan section)   Acute Rehab OT Goals Patient Stated Goal: return home OT Goal Formulation: With patient/family Time For Goal Achievement: 09/16/23 Potential to Achieve Goals: Good   OT Frequency:  Min 1X/week    Co-evaluation  PT/OT/SLP Co-Evaluation/Treatment: Yes Reason for Co-Treatment: To address functional/ADL transfers   OT goals addressed during session: ADL's and self-care                       End of Session Equipment Utilized During Treatment: Rolling walker (2 wheels);Oxygen   Activity Tolerance: Patient tolerated treatment well Patient left: Other (comment) (toilet with husband present to assist once  the pt was done)  OT Visit Diagnosis: Unsteadiness on feet (R26.81);Other abnormalities of gait and mobility (R26.89);Muscle weakness (generalized) (M62.81);History of falling (Z91.81)                Time: 6578-4696 OT Time Calculation (min): 14 min Charges:  OT General Charges $OT Visit: 1 Visit OT Evaluation $OT Eval Low Complexity: 1 Low  Jermal Dismuke OT, MOT  Thurnell Floss 09/02/2023, 9:34 AM

## 2023-09-02 NOTE — Inpatient Diabetes Management (Signed)
 Inpatient Diabetes Program Recommendations  AACE/ADA: New Consensus Statement on Inpatient Glycemic Control   Target Ranges:  Prepandial:   less than 140 mg/dL      Peak postprandial:   less than 180 mg/dL (1-2 hours)      Critically ill patients:  140 - 180 mg/dL    Latest Reference Range & Units 09/02/23 00:14 09/02/23 03:28 09/02/23 05:20  Glucose-Capillary 70 - 99 mg/dL 213 (H) 086 (H) 578 (H)    Latest Reference Range & Units 09/01/23 10:57 09/01/23 12:00 09/01/23 13:10 09/01/23 14:03 09/01/23 15:14 09/01/23 16:36 09/01/23 19:42  Glucose-Capillary 70 - 99 mg/dL 469 (H) 629 (H) 528 (H) 203 (H) 198 (H) 229 (H) 168 (H)   Review of Glycemic Control  Diabetes history: DM2 Outpatient Diabetes medications: Lantus  20 units daily, Novolog  correction scale, Glipizide XL 5 mg QAM Current orders for Inpatient glycemic control: Semglee  20 units daily, Novolog  0-20 units TID with meals, Novolog  0-5 units at bedtime; Prednisone  40 mg QAM  Inpatient Diabetes Program Recommendations:    Insulin :  If steroids are continued, please consider increasing Semglee  to 23 units daily and ordering Novolog  3 units TID with meals for meal coverage if patient eats at least 50% of meals.  NOTE: This is patient's 3rd admission in past 1 month.   Patient admitted on 08/31/23 with acute respiratory failure, COPDE, acute metabolic encephalopathy, and fall at home with ulna fx. Initial lab glucose 278 mg/dl on 06/28/22 at 40:10. Patient received Solumedrol 40 mg at 22:40 on 08/31/23 and glucose up to 518 mg/dl at 27:25 on 3/66/44. IV insulin  was started on 09/01/23 at 5:49 am. Patient was transitioned off IV insulin  to SQ insulin  regimen on 09/01/23, patient received Semglee  20 units at 13:12 and IV insulin  was stopped at 15:26 on 09/01/23.   Thanks, Beacher Limerick, RN, MSN, CDCES Diabetes Coordinator Inpatient Diabetes Program [phone] (Team Pager from 8am to 5pm)

## 2023-09-02 NOTE — Discharge Instructions (Signed)
 IMPORTANT INFORMATION: PAY CLOSE ATTENTION   PHYSICIAN DISCHARGE INSTRUCTIONS  Follow with Primary care provider  Lupita Raider, NP  and other consultants as instructed by your Hospitalist Physician  SEEK MEDICAL CARE OR RETURN TO EMERGENCY ROOM IF SYMPTOMS COME BACK, WORSEN OR NEW PROBLEM DEVELOPS   Please note: You were cared for by a hospitalist during your hospital stay. Every effort will be made to forward records to your primary care provider.  You can request that your primary care provider send for your hospital records if they have not received them.  Once you are discharged, your primary care physician will handle any further medical issues. Please note that NO REFILLS for any discharge medications will be authorized once you are discharged, as it is imperative that you return to your primary care physician (or establish a relationship with a primary care physician if you do not have one) for your post hospital discharge needs so that they can reassess your need for medications and monitor your lab values.  Please get a complete blood count and chemistry panel checked by your Primary MD at your next visit, and again as instructed by your Primary MD.  Get Medicines reviewed and adjusted: Please take all your medications with you for your next visit with your Primary MD  Laboratory/radiological data: Please request your Primary MD to go over all hospital tests and procedure/radiological results at the follow up, please ask your primary care provider to get all Hospital records sent to his/her office.  In some cases, they will be blood work, cultures and biopsy results pending at the time of your discharge. Please request that your primary care provider follow up on these results.  If you are diabetic, please bring your blood sugar readings with you to your follow up appointment with primary care.    Please call and make your follow up appointments as soon as possible.    Also Note  the following: If you experience worsening of your admission symptoms, develop shortness of breath, life threatening emergency, suicidal or homicidal thoughts you must seek medical attention immediately by calling 911 or calling your MD immediately  if symptoms less severe.  You must read complete instructions/literature along with all the possible adverse reactions/side effects for all the Medicines you take and that have been prescribed to you. Take any new Medicines after you have completely understood and accpet all the possible adverse reactions/side effects.   Do not drive when taking Pain medications or sleeping medications (Benzodiazepines)  Do not take more than prescribed Pain, Sleep and Anxiety Medications. It is not advisable to combine anxiety,sleep and pain medications without talking with your primary care practitioner  Special Instructions: If you have smoked or chewed Tobacco  in the last 2 yrs please stop smoking, stop any regular Alcohol  and or any Recreational drug use.  Wear Seat belts while driving.  Do not drive if taking any narcotic, mind altering or controlled substances or recreational drugs or alcohol.

## 2023-09-03 ENCOUNTER — Telehealth: Payer: Self-pay

## 2023-09-03 DIAGNOSIS — S52692A Other fracture of lower end of left ulna, initial encounter for closed fracture: Secondary | ICD-10-CM | POA: Diagnosis not present

## 2023-09-03 NOTE — Transitions of Care (Post Inpatient/ED Visit) (Signed)
   09/03/2023  Name: Veronica Moses MRN: [id number] DOB: 06-Jan-1956  Today's TOC FU Call Status: Today's TOC FU Call Status:: Unsuccessful Call (1st Attempt) Unsuccessful Call (1st Attempt) Date: 09/03/23  Attempted to reach the patient regarding the most recent Inpatient/ED visit.  Follow Up Plan: Additional outreach attempts will be made to reach the patient to complete the Transitions of Care (Post Inpatient/ED visit) call.   Verba Girt RN, BSN, CCM [employer], Population Health Case Manager Phone: [phone]

## 2023-09-04 ENCOUNTER — Telehealth: Payer: Self-pay | Admitting: Orthopedic Surgery

## 2023-09-04 ENCOUNTER — Telehealth: Payer: Self-pay

## 2023-09-04 DIAGNOSIS — Z96641 Presence of right artificial hip joint: Secondary | ICD-10-CM | POA: Diagnosis not present

## 2023-09-04 DIAGNOSIS — J9601 Acute respiratory failure with hypoxia: Secondary | ICD-10-CM | POA: Diagnosis not present

## 2023-09-04 DIAGNOSIS — E1165 Type 2 diabetes mellitus with hyperglycemia: Secondary | ICD-10-CM | POA: Diagnosis not present

## 2023-09-04 DIAGNOSIS — G40909 Epilepsy, unspecified, not intractable, without status epilepticus: Secondary | ICD-10-CM | POA: Diagnosis not present

## 2023-09-04 DIAGNOSIS — I119 Hypertensive heart disease without heart failure: Secondary | ICD-10-CM | POA: Diagnosis not present

## 2023-09-04 DIAGNOSIS — J441 Chronic obstructive pulmonary disease with (acute) exacerbation: Secondary | ICD-10-CM | POA: Diagnosis not present

## 2023-09-04 DIAGNOSIS — M48061 Spinal stenosis, lumbar region without neurogenic claudication: Secondary | ICD-10-CM | POA: Diagnosis not present

## 2023-09-04 DIAGNOSIS — M47816 Spondylosis without myelopathy or radiculopathy, lumbar region: Secondary | ICD-10-CM | POA: Diagnosis not present

## 2023-09-04 DIAGNOSIS — S72091D Other fracture of head and neck of right femur, subsequent encounter for closed fracture with routine healing: Secondary | ICD-10-CM | POA: Diagnosis not present

## 2023-09-04 NOTE — Transitions of Care (Post Inpatient/ED Visit) (Signed)
   09/04/2023  Name: Tarena Gockley MRN: [id number] DOB: Dec 31, 1955  Today's TOC FU Call Status: Today's TOC FU Call Status:: Unsuccessful Call (2nd Attempt) Unsuccessful Call (2nd Attempt) Date: 09/04/23  Attempted to reach the patient regarding the most recent Inpatient/ED visit.  Follow Up Plan: Additional outreach attempts will be made to reach the patient to complete the Transitions of Care (Post Inpatient/ED visit) call.    Cadell Gabrielson J. Dyquan Minks RN, MSN [facility], [facility] RN Care Manager Direct Dial: [phone]  Fax: [phone] Website: [email or web address]

## 2023-09-04 NOTE — Telephone Encounter (Signed)
 DR. Debbora Fair with [facility] he is the physical therapist for this patient.  She has fallen since being discharged and has questions please call him back at [phone]

## 2023-09-07 ENCOUNTER — Telehealth: Payer: Self-pay

## 2023-09-07 NOTE — Patient Instructions (Signed)
 Visit Information  Thank you for taking time to visit with me today. Please don't hesitate to contact me if I can be of assistance to you before our next scheduled telephone appointment.  Our next appointment is by telephone on 09/15/23 at 930 am  Following is a copy of your care plan:   Goals Addressed             This Visit's Progress    VBCI Transitions of Care (TOC) Care Plan       Problems:  Recent Hospitalization for treatment of Falls Knowledge Deficit Related to falls prevention  Goal:  Over the next 30 days, the patient will not experience hospital readmission  Interventions:  Transitions of Care: Doctor Visits  - discussed the importance of doctor visits Post-op wound/incision care reviewed with patient/caregiver Reviewed Signs and symptoms of infection  Patient to see PCP 09/08/23. Patient has some history of dementia. Patient and spouse complete assessment. Spouse Clyde primary care giver.  Patient has recent right hip fracture repair.  No signs of infection to site.  Left wrist fracture from most recent fall.  Left wrist with brace.  Orthopedic follow up scheduled next week per patient  Falls Interventions: Reviewed medications and discussed potential side effects of medications such as dizziness and frequent urination Provided verbal education on falls and falls prevention Keeping moving and stay active Keep your bones strong Go for regular eye checkups Always stand up slowly Wear proper non-slip footwear Light up your living space Use assistive devices.   Patient Self Care Activities:  Attend all scheduled provider appointments Call provider office for new concerns or questions  Notify RN Care Manager of [facility] call rescheduling needs Participate in Transition of Care Program/Attend TOC scheduled calls Take medications as prescribed   Use Assistive devices when ambulating  Plan:  Telephone follow up appointment with care management team member scheduled  for:  09/15/23 @ 930        Patient verbalizes understanding of instructions and care plan provided today and agrees to view in MyChart. Active MyChart status and patient understanding of how to access instructions and care plan via MyChart confirmed with patient.     The patient has been provided with contact information for the care management team and has been advised to call with any health related questions or concerns.   Please call the care guide team at [phone] if you need to cancel or reschedule your appointment.   Please call the Suicide and Crisis Lifeline: 988 if you are experiencing a Mental Health or Behavioral Health Crisis or need someone to talk to.  Autry Droege J. Anika Shore RN, MSN [facility], [facility] Health RN Care Manager Direct Dial: [phone]  Fax: [phone] Website: [email or web address]

## 2023-09-07 NOTE — Transitions of Care (Post Inpatient/ED Visit) (Signed)
 09/07/2023  Name: Veronica Moses MRN: [id number] DOB: 1955-06-27  Today's TOC FU Call Status: Today's TOC FU Call Status:: Successful TOC FU Call Completed TOC FU Call Complete Date: 09/07/23 Patient's Name and Date of Birth confirmed.  Transition Care Management Follow-up Telephone Call Date of Discharge: 09/02/23 [facility] Type of Discharge: Inpatient Admission Primary Inpatient Discharge Diagnosis:: Acute Respiratory Failure with hypoxia How have you been since you were released from the hospital?: Better Any questions or concerns?: No  Items Reviewed: Did you receive and understand the discharge instructions provided?: Yes Medications obtained,verified, and reconciled?: Yes (Medications Reviewed) Any new allergies since your discharge?: No Dietary orders reviewed?: Yes Type of Diet Ordered:: Low sodium heart healthy card modified Do you have support at home?: Yes People in Home [RPT]: spouse Name of Support/Comfort Primary Source: Velma Bonds  Medications Reviewed Today: Medications Reviewed Today     Reviewed by Benjamen Koelling, RN (Case Manager) on 09/07/23 at [phone]  Med List Status: <None>   Medication Order Taking? Sig Documenting Provider Last Dose Status Informant  acetaminophen  (TYLENOL ) 325 MG tablet [id number] Yes Take 325 mg by mouth every 6 (six) hours as needed for mild pain or moderate pain. [provider]  Active Spouse/Significant Other  albuterol  (VENTOLIN  HFA) 108 (90 Base) MCG/ACT inhaler [id number] Yes Inhale 2 puffs into the lungs every 6 (six) hours as needed for wheezing or shortness of breath. Vicci Afton CROME, MD  Active   alendronate (FOSAMAX) 70 MG tablet [id number] Yes Take 70 mg by mouth every Saturday. [provider]  Active Spouse/Significant Other  ALPRAZolam  (XANAX ) 0.5 MG tablet [id number] Yes Take 0.5 mg by mouth See admin instructions. Take 1 tablet if needed daily and 2 tablets every evening [provider]  Active Spouse/Significant Other  aspirin  81 MG chewable tablet [id number] Yes Chew 1 tablet (81 mg total) by mouth 2 (two) times daily AND 1 tablet (81 mg total) daily. Franchot Novel, MD  Active Spouse/Significant Other  dextromethorphan  (DELSYM ) 30 MG/5ML liquid [id number] Yes Take 5 mLs (30 mg total) by mouth 2 (two) times daily as needed for cough. Johnson, Clanford L, MD  Active   donepezil  (ARICEPT ) 10 MG tablet [id number] Yes Take 1 tablet (10 mg total) by mouth at bedtime. Gayland Lauraine PARAS, NP  Active Spouse/Significant Other  escitalopram  (LEXAPRO ) 20 MG tablet [id number] Yes Take 20 mg by mouth daily. [provider]  Active Spouse/Significant Other  ferrous sulfate  325 (65 FE) MG tablet [id number] Yes Take 1 tablet (325 mg total) by mouth daily with breakfast. Franchot Novel, MD  Active Spouse/Significant Other  gabapentin  (NEURONTIN ) 300 MG capsule [id number] Yes Take 1 capsule by mouth daily. [provider]  Active Spouse/Significant Other  glipiZIDE (GLUCOTROL XL) 5 MG 24 hr tablet [id number] Yes Take 5 mg by mouth daily with breakfast. [provider]  Active Spouse/Significant Other  insulin  glargine (LANTUS ) 100 UNIT/ML Solostar Pen [id number] Yes Inject 20 Units into the skin daily. Franchot Novel, MD  Active Spouse/Significant Other  ipratropium-albuterol  (DUONEB) 0.5-2.5 (3) MG/3ML SOLN [id number] Yes Take 3 mLs by nebulization every 4 (four) hours as needed (wheezing, shortness of breath). Johnson, Clanford L, MD  Active   levETIRAcetam  (KEPPRA ) 1000 MG tablet [id number] Yes Take 1 tablet (1,000 mg total) by mouth 2 (two) times daily. Gayland Lauraine PARAS, NP  Active Spouse/Significant Other  losartan  (COZAAR ) 50 MG tablet [id number] Yes Take 1 tablet (50 mg total) by mouth daily. Briana Elgin LABOR,  MD  Active Spouse/Significant Other  mirtazapine (REMERON) 7.5 MG tablet [id number] Yes Take 7.5 mg by mouth at bedtime. [provider]  Active  Spouse/Significant Other  NOVOLOG  FLEXPEN 100 UNIT/ML FlexPen [id number] Yes Please use the hospital sliding scale provided for you Vonzell Loving, MD  Active Spouse/Significant Other           Med Note BLASE, DANA K   Mon Aug 31, 2023  8:21 PM)    oxyCODONE  (OXY IR/ROXICODONE ) 5 MG immediate release tablet [id number] Yes Take 1 tablet (5 mg total) by mouth every 6 (six) hours as needed for severe pain (pain score 7-10). Franchot Novel, MD  Active Spouse/Significant Other  polyethylene glycol (MIRALAX ) 17 g packet [id number] Yes Take 17 g by mouth daily as needed for mild constipation or moderate constipation. Johnson, Clanford L, MD  Active   potassium chloride  (KLOR-CON ) 10 MEQ tablet [id number] Yes Take 10 mEq by mouth daily. [provider]  Active Spouse/Significant Other  predniSONE  (DELTASONE ) 20 MG tablet [id number] Yes Take 1 tablet (20 mg total) by mouth daily with breakfast for 4 days. Vicci Afton CROME, MD  Active   rosuvastatin  (CRESTOR ) 20 MG tablet [id number] Yes Take 20 mg by mouth daily. [provider]  Active Spouse/Significant Other  senna (SENOKOT) 8.6 MG TABS tablet [id number] Yes Take 1 tablet (8.6 mg total) by mouth daily as needed for mild constipation or moderate constipation. Vicci Afton CROME, MD  Active   varenicline  (CHANTIX ) 1 MG tablet [id number] Yes Take 0.5 tablets (0.5 mg total) by mouth daily for 3 days, THEN 0.5 tablets (0.5 mg total) 2 (two) times daily for 4 days, THEN 1 tablet (1 mg total) 2 (two) times daily. Franchot Novel, MD  Active Spouse/Significant Other            Home Care and Equipment/Supplies: Were Home Health Services Ordered?: Yes Name of Home Health Agency:: [facility] Any new equipment or medical supplies ordered?: Yes Name of Medical supply agency?: [facility] Were you able to get the equipment/medical supplies?: No Do you have any questions related to the use of the equipment/supplies?: No  Functional  Questionnaire: Do you need assistance with bathing/showering or dressing?: Yes (spouse assists) Do you need assistance with meal preparation?: Yes (spouse assists) Do you need assistance with eating?: No Do you have difficulty maintaining continence: No Do you need assistance with getting out of bed/getting out of a chair/moving?: Yes (spouse assists) Do you have difficulty managing or taking your medications?: Yes (spouse does medications)  Follow up appointments reviewed: PCP Follow-up appointment confirmed?: Yes Date of PCP follow-up appointment?: 09/08/23 Follow-up Provider: Carliss Batty Specialist [facility] Follow-up appointment confirmed?: Yes Date of Specialist follow-up appointment?:  (patient states next weel) Follow-Up Specialty Provider:: Dr. Onesimo Do you need transportation to your follow-up appointment?: No Do you understand care options if your condition(s) worsen?: Yes-patient verbalized understanding  SDOH Interventions Today    Flowsheet Row Most Recent Value  SDOH Interventions   Food Insecurity Interventions Intervention Not Indicated  Housing Interventions Intervention Not Indicated  Transportation Interventions Intervention Not Indicated  Utilities Interventions Intervention Not Indicated    Goals Addressed             This Visit's Progress    VBCI Transitions of Care (TOC) Care Plan       Problems:  Recent Hospitalization for treatment of Falls Knowledge Deficit Related to falls prevention  Goal:  Over the next 30 days, the patient will not  experience hospital readmission  Interventions:  Transitions of Care: Doctor Visits  - discussed the importance of doctor visits Post-op wound/incision care reviewed with patient/caregiver Reviewed Signs and symptoms of infection  Patient to see PCP 09/08/23. Patient has some history of dementia. Patient and spouse complete assessment. Spouse Clyde primary care giver.  Patient has recent right hip fracture  repair.  No signs of infection to site.  Left wrist fracture from most recent fall.  Left wrist with brace.  Orthopedic follow up scheduled next week per patient  Falls Interventions: Reviewed medications and discussed potential side effects of medications such as dizziness and frequent urination Provided verbal education on falls and falls prevention Keeping moving and stay active Keep your bones strong Go for regular eye checkups Always stand up slowly Wear proper non-slip footwear Light up your living space Use assistive devices.   Patient Self Care Activities:  Attend all scheduled provider appointments Call provider office for new concerns or questions  Notify RN Care Manager of [facility] call rescheduling needs Participate in Transition of Care Program/Attend [facility] scheduled calls Take medications as prescribed   Use Assistive devices when ambulating  Plan:  Telephone follow up appointment with care management team member scheduled for:  09/15/23 @ 930        Kenlee Maler DOROTHA Seeds RN, MSN [facility], [facility] Health RN Care Manager Direct Dial: [phone]  Fax: [phone] Website: [email or web address]

## 2023-09-08 DIAGNOSIS — J449 Chronic obstructive pulmonary disease, unspecified: Secondary | ICD-10-CM | POA: Diagnosis not present

## 2023-09-08 DIAGNOSIS — S62102D Fracture of unspecified carpal bone, left wrist, subsequent encounter for fracture with routine healing: Secondary | ICD-10-CM | POA: Diagnosis not present

## 2023-09-08 DIAGNOSIS — S72001D Fracture of unspecified part of neck of right femur, subsequent encounter for closed fracture with routine healing: Secondary | ICD-10-CM | POA: Diagnosis not present

## 2023-09-08 DIAGNOSIS — R350 Frequency of micturition: Secondary | ICD-10-CM | POA: Diagnosis not present

## 2023-09-09 DIAGNOSIS — G40909 Epilepsy, unspecified, not intractable, without status epilepticus: Secondary | ICD-10-CM | POA: Diagnosis not present

## 2023-09-09 DIAGNOSIS — M47816 Spondylosis without myelopathy or radiculopathy, lumbar region: Secondary | ICD-10-CM | POA: Diagnosis not present

## 2023-09-09 DIAGNOSIS — M48061 Spinal stenosis, lumbar region without neurogenic claudication: Secondary | ICD-10-CM | POA: Diagnosis not present

## 2023-09-09 DIAGNOSIS — I119 Hypertensive heart disease without heart failure: Secondary | ICD-10-CM | POA: Diagnosis not present

## 2023-09-09 DIAGNOSIS — Z96641 Presence of right artificial hip joint: Secondary | ICD-10-CM | POA: Diagnosis not present

## 2023-09-09 DIAGNOSIS — S72091D Other fracture of head and neck of right femur, subsequent encounter for closed fracture with routine healing: Secondary | ICD-10-CM | POA: Diagnosis not present

## 2023-09-09 DIAGNOSIS — E1165 Type 2 diabetes mellitus with hyperglycemia: Secondary | ICD-10-CM | POA: Diagnosis not present

## 2023-09-09 DIAGNOSIS — J9601 Acute respiratory failure with hypoxia: Secondary | ICD-10-CM | POA: Diagnosis not present

## 2023-09-09 DIAGNOSIS — J441 Chronic obstructive pulmonary disease with (acute) exacerbation: Secondary | ICD-10-CM | POA: Diagnosis not present

## 2023-09-09 DIAGNOSIS — N390 Urinary tract infection, site not specified: Secondary | ICD-10-CM | POA: Diagnosis not present

## 2023-09-11 DIAGNOSIS — M48061 Spinal stenosis, lumbar region without neurogenic claudication: Secondary | ICD-10-CM | POA: Diagnosis not present

## 2023-09-11 DIAGNOSIS — E1165 Type 2 diabetes mellitus with hyperglycemia: Secondary | ICD-10-CM | POA: Diagnosis not present

## 2023-09-11 DIAGNOSIS — J441 Chronic obstructive pulmonary disease with (acute) exacerbation: Secondary | ICD-10-CM | POA: Diagnosis not present

## 2023-09-11 DIAGNOSIS — M47816 Spondylosis without myelopathy or radiculopathy, lumbar region: Secondary | ICD-10-CM | POA: Diagnosis not present

## 2023-09-11 DIAGNOSIS — G40909 Epilepsy, unspecified, not intractable, without status epilepticus: Secondary | ICD-10-CM | POA: Diagnosis not present

## 2023-09-11 DIAGNOSIS — Z96641 Presence of right artificial hip joint: Secondary | ICD-10-CM | POA: Diagnosis not present

## 2023-09-11 DIAGNOSIS — S72091D Other fracture of head and neck of right femur, subsequent encounter for closed fracture with routine healing: Secondary | ICD-10-CM | POA: Diagnosis not present

## 2023-09-11 DIAGNOSIS — J9601 Acute respiratory failure with hypoxia: Secondary | ICD-10-CM | POA: Diagnosis not present

## 2023-09-11 DIAGNOSIS — I119 Hypertensive heart disease without heart failure: Secondary | ICD-10-CM | POA: Diagnosis not present

## 2023-09-14 DIAGNOSIS — J441 Chronic obstructive pulmonary disease with (acute) exacerbation: Secondary | ICD-10-CM | POA: Diagnosis not present

## 2023-09-14 DIAGNOSIS — I119 Hypertensive heart disease without heart failure: Secondary | ICD-10-CM | POA: Diagnosis not present

## 2023-09-14 DIAGNOSIS — E1165 Type 2 diabetes mellitus with hyperglycemia: Secondary | ICD-10-CM | POA: Diagnosis not present

## 2023-09-14 DIAGNOSIS — S72091D Other fracture of head and neck of right femur, subsequent encounter for closed fracture with routine healing: Secondary | ICD-10-CM | POA: Diagnosis not present

## 2023-09-14 DIAGNOSIS — J9601 Acute respiratory failure with hypoxia: Secondary | ICD-10-CM | POA: Diagnosis not present

## 2023-09-14 DIAGNOSIS — M47816 Spondylosis without myelopathy or radiculopathy, lumbar region: Secondary | ICD-10-CM | POA: Diagnosis not present

## 2023-09-14 DIAGNOSIS — M48061 Spinal stenosis, lumbar region without neurogenic claudication: Secondary | ICD-10-CM | POA: Diagnosis not present

## 2023-09-14 DIAGNOSIS — G40909 Epilepsy, unspecified, not intractable, without status epilepticus: Secondary | ICD-10-CM | POA: Diagnosis not present

## 2023-09-14 DIAGNOSIS — Z96641 Presence of right artificial hip joint: Secondary | ICD-10-CM | POA: Diagnosis not present

## 2023-09-15 ENCOUNTER — Other Ambulatory Visit: Payer: Self-pay

## 2023-09-15 NOTE — Patient Instructions (Signed)
 Visit Information  Thank you for taking time to visit with me today. Please don't hesitate to contact me if I can be of assistance to you before our next scheduled telephone appointment.  Our next appointment is by telephone on 09/22/23 at 0930 am  Following is a copy of your care plan:   Goals Addressed             This Visit's Progress    VBCI Transitions of Care (TOC) Care Plan       Problems:  Recent Hospitalization for treatment of Falls Knowledge Deficit Related to falls prevention  Goal:  Over the next 30 days, the patient will not experience hospital readmission  Interventions:  Transitions of Care: Doctor Visits  - discussed the importance of doctor visits Post-op wound/incision care reviewed with patient/caregiver Reviewed Signs and symptoms of infection  Patient has some history of dementia. Spouse Clyde completes call. He states patient is doing well this week but she was found to have a UTI. Now on Bactrim.  Reviewed signs of worsening UTI. Patient has recent right hip fracture repair.  Right hip continues with no signs of infection.  Left wrist fracture from most recent fall.  Left wrist with brace.  Orthopedic follow up 09/16/23.  Falls Interventions: Reviewed medications and discussed potential side effects of medications such as dizziness and frequent urination Reiterated education on falls and falls prevention Keeping moving and stay active Keep your bones strong Go for regular eye checkups Always stand up slowly Wear proper non-slip footwear Light up your living space Use assistive devices.   Patient Self Care Activities:  Attend all scheduled provider appointments Call provider office for new concerns or questions  Notify RN Care Manager of TOC call rescheduling needs Participate in Transition of Care Program/Attend TOC scheduled calls Take medications as prescribed   Use Assistive devices when ambulating  Plan:  Telephone follow up appointment with  care management team member scheduled for:  09/22/23 @ 930 am        Patient verbalizes understanding of instructions and care plan provided today and agrees to view in MyChart. Active MyChart status and patient understanding of how to access instructions and care plan via MyChart confirmed with patient.     The patient has been provided with contact information for the care management team and has been advised to call with any health related questions or concerns.   Please call the care guide team at [phone] if you need to cancel or reschedule your appointment.   Please call the Suicide and Crisis Lifeline: 988 if you are experiencing a Mental Health or Behavioral Health Crisis or need someone to talk to.  Keron Neenan J. Marylyn Appenzeller RN, MSN [facility], [facility] Health RN Care Manager Direct Dial: [phone]  Fax: [phone] Website: [email or web address]

## 2023-09-15 NOTE — Transitions of Care (Post Inpatient/ED Visit) (Signed)
 Transition of Care week 2  Visit Note  09/15/2023  Name: Veronica Moses MRN: [id number]          DOB: Jun 25, 1955  Situation: Patient enrolled in [facility] 30-day program. Visit completed with spouse Lott by telephone.   Background:   Initial Transition Care Management Follow-up Telephone Call    Past Medical History:  Diagnosis Date   Anxiety    Closed fracture of right distal radius    COPD (chronic obstructive pulmonary disease) (HCC)    Depression    Diabetes mellitus    Insulin  Dependent. Adult onset   Dyspnea    with exertion   Heart murmur    Hypertension    Memory changes    since being on vent June 2017- coming back some.   Seizures (HCC) 08/2015   last seizure 07-2019   Stroke [facility]) 2016   remote, no deficits   UTI (lower urinary tract infection)     Assessment: Patient Reported Symptoms: Cognitive Cognitive Status: Alert and oriented to person, place, and time      Neurological Neurological Review of Symptoms: No symptoms reported    HEENT HEENT Symptoms Reported: No symptoms reported      Cardiovascular Cardiovascular Symptoms Reported: No symptoms reported    Respiratory      Endocrine Endocrine Symptoms Reported: No symptoms reported Is patient diabetic?: Yes Is patient checking blood sugars at home?: Yes List most recent blood sugar readings, include date and time of day: Last sugar 124 Endocrine Self-Management Outcome: 4 (good)  Gastrointestinal Gastrointestinal Symptoms Reported: No symptoms reported      Genitourinary Genitourinary Symptoms Reported: No symptoms reported    Integumentary Integumentary Symptoms Reported: Incision Additional Integumentary Details: right hip incision with no signs of infection Skin Self-Management Outcome: 4 (good)  Musculoskeletal Musculoskelatal Symptoms Reviewed: Difficulty walking, Unsteady gait Additional Musculoskeletal Details: Uses walker        Psychosocial Psychosocial Symptoms Reported: No symptoms  reported         There were no vitals filed for this visit.  Medications Reviewed Today     Reviewed by Rendell Thivierge, RN (Case Manager) on 09/15/23 at 1347  Med List Status: <None>   Medication Order Taking? Sig Documenting Provider Last Dose Status Informant  acetaminophen  (TYLENOL ) 325 MG tablet [id number] Yes Take 325 mg by mouth every 6 (six) hours as needed for mild pain or moderate pain. [provider]  Active Spouse/Significant Other  albuterol  (VENTOLIN  HFA) 108 (90 Base) MCG/ACT inhaler [id number] Yes Inhale 2 puffs into the lungs every 6 (six) hours as needed for wheezing or shortness of breath. Vicci Afton CROME, MD  Active   alendronate (FOSAMAX) 70 MG tablet [id number] Yes Take 70 mg by mouth every Saturday. [provider]  Active Spouse/Significant Other  ALPRAZolam  (XANAX ) 0.5 MG tablet [id number] Yes Take 0.5 mg by mouth See admin instructions. Take 1 tablet if needed daily and 2 tablets every evening [provider]  Active Spouse/Significant Other  aspirin  81 MG chewable tablet [id number] Yes Chew 1 tablet (81 mg total) by mouth 2 (two) times daily AND 1 tablet (81 mg total) daily. Franchot Novel, MD  Active Spouse/Significant Other  dextromethorphan  (DELSYM ) 30 MG/5ML liquid [id number] Yes Take 5 mLs (30 mg total) by mouth 2 (two) times daily as needed for cough. Johnson, Clanford L, MD  Active   donepezil  (ARICEPT ) 10 MG tablet [id number] Yes Take 1 tablet (10 mg total) by mouth at bedtime. Gayland Lauraine PARAS, NP  Active  Spouse/Significant Other  escitalopram  (LEXAPRO ) 20 MG tablet [id number] Yes Take 20 mg by mouth daily. [provider]  Active Spouse/Significant Other  ferrous sulfate  325 (65 FE) MG tablet [id number] Yes Take 1 tablet (325 mg total) by mouth daily with breakfast. Franchot Novel, MD  Active Spouse/Significant Other  gabapentin  (NEURONTIN ) 300 MG capsule [id number] Yes Take 1 capsule by mouth daily. [provider]   Active Spouse/Significant Other  glipiZIDE (GLUCOTROL XL) 5 MG 24 hr tablet [id number] Yes Take 5 mg by mouth daily with breakfast. [provider]  Active Spouse/Significant Other  insulin  glargine (LANTUS ) 100 UNIT/ML Solostar Pen [id number] Yes Inject 20 Units into the skin daily. Franchot Novel, MD  Active Spouse/Significant Other  ipratropium-albuterol  (DUONEB) 0.5-2.5 (3) MG/3ML SOLN [id number] Yes Take 3 mLs by nebulization every 4 (four) hours as needed (wheezing, shortness of breath). Vicci Afton CROME, MD  Active   levETIRAcetam  (KEPPRA ) 1000 MG tablet [id number] Yes Take 1 tablet (1,000 mg total) by mouth 2 (two) times daily. Gayland Lauraine PARAS, NP  Active Spouse/Significant Other  losartan  (COZAAR ) 50 MG tablet [id number] Yes Take 1 tablet (50 mg total) by mouth daily. Briana Elgin LABOR, MD  Active Spouse/Significant Other  mirtazapine (REMERON) 7.5 MG tablet [id number] Yes Take 7.5 mg by mouth at bedtime. [provider]  Active Spouse/Significant Other  NOVOLOG  FLEXPEN 100 UNIT/ML FlexPen [id number] Yes Please use the hospital sliding scale provided for you Vonzell Loving, MD  Active Spouse/Significant Other           Med Note BLASE, DANA K   Mon Aug 31, 2023  8:21 PM)    oxyCODONE  (OXY IR/ROXICODONE ) 5 MG immediate release tablet [id number] Yes Take 1 tablet (5 mg total) by mouth every 6 (six) hours as needed for severe pain (pain score 7-10). Franchot Novel, MD  Active Spouse/Significant Other  polyethylene glycol (MIRALAX ) 17 g packet [id number] Yes Take 17 g by mouth daily as needed for mild constipation or moderate constipation. Vicci Afton CROME, MD  Active   potassium chloride  (KLOR-CON ) 10 MEQ tablet [id number] Yes Take 10 mEq by mouth daily. [provider]  Active Spouse/Significant Other  rosuvastatin  (CRESTOR ) 20 MG tablet [id number] Yes Take 20 mg by mouth daily. [provider]  Active Spouse/Significant Other  senna (SENOKOT) 8.6 MG TABS tablet  [id number] Yes Take 1 tablet (8.6 mg total) by mouth daily as needed for mild constipation or moderate constipation. Vicci Afton CROME, MD  Active   sulfamethoxazole-trimethoprim (BACTRIM DS) 800-160 MG tablet [id number] Yes Take 1 tablet by mouth 2 (two) times daily. [provider]  Active   varenicline  (CHANTIX ) 1 MG tablet [id number] Yes Take 0.5 tablets (0.5 mg total) by mouth daily for 3 days, THEN 0.5 tablets (0.5 mg total) 2 (two) times daily for 4 days, THEN 1 tablet (1 mg total) 2 (two) times daily. Franchot Novel, MD  Active Spouse/Significant Other            Goals Addressed             This Visit's Progress    VBCI Transitions of Care (TOC) Care Plan       Problems:  Recent Hospitalization for treatment of Falls Knowledge Deficit Related to falls prevention  Goal:  Over the next 30 days, the patient will not experience hospital readmission  Interventions:  Transitions of Care: Doctor Visits  - discussed the importance of doctor visits Post-op wound/incision care reviewed with patient/caregiver Reviewed Signs and symptoms of infection  Patient has some history of dementia. Spouse Clyde completes call. He states patient is doing well this week but she was found to have a UTI. Now on Bactrim.  Reviewed signs of worsening UTI. Patient has recent right hip fracture repair.  Right hip continues with no signs of infection.  Left wrist fracture from most recent fall.  Left wrist with brace.  Orthopedic follow up 09/16/23.  Falls Interventions: Reviewed medications and discussed potential side effects of medications such as dizziness and frequent urination Reiterated education on falls and falls prevention Keeping moving and stay active Keep your bones strong Go for regular eye checkups Always stand up slowly Wear proper non-slip footwear Light up your living space Use assistive devices.   Patient Self Care Activities:  Attend all scheduled provider  appointments Call provider office for new concerns or questions  Notify RN Care Manager of TOC call rescheduling needs Participate in Transition of Care Program/Attend TOC scheduled calls Take medications as prescribed   Use Assistive devices when ambulating  Plan:  Telephone follow up appointment with care management team member scheduled for:  09/22/23 @ 930 am        Recommendation:   Continue Current Plan of Care  Follow Up Plan:   Telephone follow-up in 1 week   Azrael Maddix DOROTHA Seeds RN, MSN [facility], [facility] Health RN Care Manager Direct Dial: [phone]  Fax: [phone] Website: [email or web address]

## 2023-09-16 ENCOUNTER — Other Ambulatory Visit (INDEPENDENT_AMBULATORY_CARE_PROVIDER_SITE_OTHER): Payer: Self-pay

## 2023-09-16 ENCOUNTER — Other Ambulatory Visit: Payer: Self-pay

## 2023-09-16 ENCOUNTER — Encounter: Payer: Self-pay | Admitting: Orthopedic Surgery

## 2023-09-16 ENCOUNTER — Ambulatory Visit (INDEPENDENT_AMBULATORY_CARE_PROVIDER_SITE_OTHER): Admitting: Orthopedic Surgery

## 2023-09-16 DIAGNOSIS — Z96641 Presence of right artificial hip joint: Secondary | ICD-10-CM

## 2023-09-16 DIAGNOSIS — S52615D Nondisplaced fracture of left ulna styloid process, subsequent encounter for closed fracture with routine healing: Secondary | ICD-10-CM

## 2023-09-16 NOTE — Progress Notes (Signed)
 Orthopaedic Postop Note  Assessment: Veronica Moses is a 68 y.o. female s/p right hip hemiarthroplasty, anterior approach; left ulnar styloid fracture  DOS: 08/20/2023  Plan: Kurtis were previously removed.  Incision is healing well.  She is ambulating well with an assistive device.  Radiographs of the right hip are stable.  She has a small, minimally displaced fracture of the ulnar styloid on the left, which was sustained in a separate fall.  Recommend she continue to use a brace on the left hand.  I would like to see her back in 1 month, to include new x-rays of the right hip, as well as the left wrist.  Follow-up: Return in about 4 weeks (around 10/14/2023). XR at next visit: Right hip and left wrist  Subjective:  Chief Complaint  Patient presents with   Post-op Follow-up    Right hip improving walking well with walker     History of Present Illness: Veronica Moses is a 68 y.o. female who presents following the above stated procedure.  Surgery was approximately a month ago.  A couple weeks after surgery, she fell.  She was subsequently admitted to the hospital for further treatment.  She was noted to have a left ulnar styloid fracture.  She was placed in a brace.  She has returned home.  She has done well.  She does continue to have some pain.  She is using a walker to assist with ambulation, with a platform for her left arm.  No other complaints at this time.  She had her staples removed in the hospital.  Review of Systems: No fevers or chills No numbness or tingling No Chest Pain No shortness of breath   Objective: There were no vitals taken for this visit.  Physical Exam:  Alert and oriented.  No acute distress.  Ambulating with the assistance of a walker.  She has a platform for her left arm.  Left arm is in a brace.  Fingers are warm and well-perfused.  Some tenderness to palpation over the ulnar styloid.  Anterior-based right hip incision is healing well.  No surrounding  erythema or drainage.  No tenderness to palpation.  She tolerates gentle range of motion of the right hip.  She can maintain straight leg raise.  Sensation is intact distally.  Active motion intact in EHL/TA.  IMAGING: I personally ordered and reviewed the following images:   AP pelvis and right hip x-rays were obtained in clinic today.  Well-positioned right hip hemiarthroplasty.  No change in overall alignment.  No lucency around the stem.  No new fractures.  Hip is reduced.  No subsidence.  Impression: Right hip hemiarthroplasty without subsidence  Oneil DELENA Horde, MD 09/16/2023 11:09 AM

## 2023-09-16 NOTE — Patient Instructions (Signed)
 After your xray today you need to call for a replacement blood sugar monitor device, look on the box and see if there is a number, I can not tell which company makes yours, but on the package there will be a customer service number, if you call them they will give you a replacement device. Just let them know you had an xray

## 2023-09-17 DIAGNOSIS — S72091D Other fracture of head and neck of right femur, subsequent encounter for closed fracture with routine healing: Secondary | ICD-10-CM | POA: Diagnosis not present

## 2023-09-17 DIAGNOSIS — M48061 Spinal stenosis, lumbar region without neurogenic claudication: Secondary | ICD-10-CM | POA: Diagnosis not present

## 2023-09-17 DIAGNOSIS — J9601 Acute respiratory failure with hypoxia: Secondary | ICD-10-CM | POA: Diagnosis not present

## 2023-09-17 DIAGNOSIS — I119 Hypertensive heart disease without heart failure: Secondary | ICD-10-CM | POA: Diagnosis not present

## 2023-09-17 DIAGNOSIS — E1165 Type 2 diabetes mellitus with hyperglycemia: Secondary | ICD-10-CM | POA: Diagnosis not present

## 2023-09-17 DIAGNOSIS — M47816 Spondylosis without myelopathy or radiculopathy, lumbar region: Secondary | ICD-10-CM | POA: Diagnosis not present

## 2023-09-17 DIAGNOSIS — J441 Chronic obstructive pulmonary disease with (acute) exacerbation: Secondary | ICD-10-CM | POA: Diagnosis not present

## 2023-09-17 DIAGNOSIS — Z96641 Presence of right artificial hip joint: Secondary | ICD-10-CM | POA: Diagnosis not present

## 2023-09-17 DIAGNOSIS — G40909 Epilepsy, unspecified, not intractable, without status epilepticus: Secondary | ICD-10-CM | POA: Diagnosis not present

## 2023-09-22 ENCOUNTER — Other Ambulatory Visit: Payer: Self-pay

## 2023-09-22 DIAGNOSIS — J441 Chronic obstructive pulmonary disease with (acute) exacerbation: Secondary | ICD-10-CM | POA: Diagnosis not present

## 2023-09-22 DIAGNOSIS — E1165 Type 2 diabetes mellitus with hyperglycemia: Secondary | ICD-10-CM | POA: Diagnosis not present

## 2023-09-22 DIAGNOSIS — G40909 Epilepsy, unspecified, not intractable, without status epilepticus: Secondary | ICD-10-CM | POA: Diagnosis not present

## 2023-09-22 DIAGNOSIS — S72091D Other fracture of head and neck of right femur, subsequent encounter for closed fracture with routine healing: Secondary | ICD-10-CM | POA: Diagnosis not present

## 2023-09-22 DIAGNOSIS — M48061 Spinal stenosis, lumbar region without neurogenic claudication: Secondary | ICD-10-CM | POA: Diagnosis not present

## 2023-09-22 DIAGNOSIS — Z96641 Presence of right artificial hip joint: Secondary | ICD-10-CM | POA: Diagnosis not present

## 2023-09-22 DIAGNOSIS — I119 Hypertensive heart disease without heart failure: Secondary | ICD-10-CM | POA: Diagnosis not present

## 2023-09-22 DIAGNOSIS — J9601 Acute respiratory failure with hypoxia: Secondary | ICD-10-CM | POA: Diagnosis not present

## 2023-09-22 DIAGNOSIS — M47816 Spondylosis without myelopathy or radiculopathy, lumbar region: Secondary | ICD-10-CM | POA: Diagnosis not present

## 2023-09-22 NOTE — Patient Instructions (Signed)
 Visit Information  Thank you for taking time to visit with me today. Please don't hesitate to contact me if I can be of assistance to you before our next scheduled telephone appointment.  Our next appointment is by telephone on 09/29/23 at 0930 am  Following is a copy of your care plan:   Goals Addressed             This Visit's Progress    [facility] Transitions of Care (TOC) Care Plan       Problems:  Recent Hospitalization for treatment of Falls Knowledge Deficit Related to falls prevention  Goal:  Over the next 30 days, the patient will not experience hospital readmission  Interventions:  Transitions of Care: Doctor Visits  - discussed the importance of doctor visits Post-op wound/incision care reviewed with patient/caregiver Reviewed Signs and symptoms of infection  Patient has some history of dementia. Spoke with patient, she states she is doing well. Therapy has one more visit patient.   UTI clear.  Patient has recent right hip fracture repair.  Right hip continues to heal.  Left wrist fracture from most recent fall.  Left wrist with brace.  Orthopedic follow up visit went well per patient and follow up  10/14/23.  Falls Interventions: Reviewed medications and discussed potential side effects of medications such as dizziness and frequent urination Reiterated education on falls and falls prevention Keeping moving and stay active Keep your bones strong Go for regular eye checkups Always stand up slowly Wear proper non-slip footwear Light up your living space Use assistive devices.   Patient Self Care Activities:  Attend all scheduled provider appointments Call provider office for new concerns or questions  Notify RN Care Manager of TOC call rescheduling needs Participate in Transition of Care Program/Attend TOC scheduled calls Take medications as prescribed   Use Assistive devices when ambulating  Plan:  Telephone follow up appointment with care management team member  scheduled for:  09/29/23 @ 930 am        Patient verbalizes understanding of instructions and care plan provided today and agrees to view in MyChart. Active MyChart status and patient understanding of how to access instructions and care plan via MyChart confirmed with patient.     The patient has been provided with contact information for the care management team and has been advised to call with any health related questions or concerns.   Please call the care guide team at [phone] if you need to cancel or reschedule your appointment.   Please call the Suicide and Crisis Lifeline: 988 if you are experiencing a Mental Health or Behavioral Health Crisis or need someone to talk to.  Jartavious Mckimmy J. Glenis Musolf RN, MSN [facility], [facility] Health RN Care Manager Direct Dial: [phone]  Fax: [phone] Website: [email or web address]

## 2023-09-22 NOTE — Transitions of Care (Post Inpatient/ED Visit) (Signed)
 Transition of Care week 3  Visit Note  09/22/2023  Name: Veronica Moses MRN: [id number]          DOB: [birth year]  Situation: Patient enrolled in [facility] 30-day program. Visit completed with patient by telephone.   Background:   Initial Transition Care Management Follow-up Telephone Call    Past Medical History:  Diagnosis Date   Anxiety    Closed fracture of right distal radius    COPD (chronic obstructive pulmonary disease) (HCC)    Depression    Diabetes mellitus    Insulin  Dependent. Adult onset   Dyspnea    with exertion   Heart murmur    Hypertension    Memory changes    since being on vent June 2017- coming back some.   Seizures (HCC) 08/2015   last seizure 07-2019   Stroke [facility]) 2016   remote, no deficits   UTI (lower urinary tract infection)     Assessment: Patient Reported Symptoms: Cognitive Cognitive Status: Alert and oriented to person, place, and time      Neurological Neurological Review of Symptoms: No symptoms reported    HEENT HEENT Symptoms Reported: No symptoms reported      Cardiovascular Cardiovascular Symptoms Reported: No symptoms reported    Respiratory Respiratory Symptoms Reported: No symptoms reported    Endocrine Endocrine Symptoms Reported: No symptoms reported Is patient diabetic?: Yes Is patient checking blood sugars at home?: Yes List most recent blood sugar readings, include date and time of day: Last sugar 212 this am- reviewed diabetes management Endocrine Self-Management Outcome: 4 (good)  Gastrointestinal Gastrointestinal Symptoms Reported: No symptoms reported      Genitourinary Genitourinary Symptoms Reported: No symptoms reported    Integumentary Integumentary Symptoms Reported: No symptoms reported    Musculoskeletal Musculoskelatal Symptoms Reviewed: Difficulty walking, Unsteady gait Additional Musculoskeletal Details: Uses walker        Psychosocial Psychosocial Symptoms Reported: No symptoms reported          There were no vitals filed for this visit.  Medications Reviewed Today     Reviewed by Juliauna Stueve, RN (Case Manager) on 09/22/23 at [phone]  Med List Status: <None>   Medication Order Taking? Sig Documenting Provider Last Dose Status Informant  acetaminophen  (TYLENOL ) 325 MG tablet [id number] Yes Take 325 mg by mouth every 6 (six) hours as needed for mild pain or moderate pain. [provider]  Active Spouse/Significant Other  albuterol  (VENTOLIN  HFA) 108 (90 Base) MCG/ACT inhaler [id number] Yes Inhale 2 puffs into the lungs every 6 (six) hours as needed for wheezing or shortness of breath. Vicci Afton CROME, MD  Active   alendronate (FOSAMAX) 70 MG tablet [id number] Yes Take 70 mg by mouth every Saturday. [provider]  Active Spouse/Significant Other  ALPRAZolam  (XANAX ) 0.5 MG tablet [id number] Yes Take 0.5 mg by mouth See admin instructions. Take 1 tablet if needed daily and 2 tablets every evening [provider]  Active Spouse/Significant Other  dextromethorphan  (DELSYM ) 30 MG/5ML liquid [id number] Yes Take 5 mLs (30 mg total) by mouth 2 (two) times daily as needed for cough. Johnson, Clanford L, MD  Active   donepezil  (ARICEPT ) 10 MG tablet [id number] Yes Take 1 tablet (10 mg total) by mouth at bedtime. Gayland Lauraine PARAS, NP  Active Spouse/Significant Other  escitalopram  (LEXAPRO ) 20 MG tablet [id number] Yes Take 20 mg by mouth daily. [provider]  Active Spouse/Significant Other  ferrous sulfate  325 (65 FE) MG tablet [id number] Yes Take 1 tablet (325 mg  total) by mouth daily with breakfast. Franchot Novel, MD  Active Spouse/Significant Other  gabapentin  (NEURONTIN ) 300 MG capsule [id number] Yes Take 1 capsule by mouth daily. [provider]  Active Spouse/Significant Other  glipiZIDE (GLUCOTROL XL) 5 MG 24 hr tablet [id number] Yes Take 5 mg by mouth daily with breakfast. [provider]  Active Spouse/Significant Other  insulin  glargine  (LANTUS ) 100 UNIT/ML Solostar Pen [id number] Yes Inject 20 Units into the skin daily. Franchot Novel, MD  Active Spouse/Significant Other  ipratropium-albuterol  (DUONEB) 0.5-2.5 (3) MG/3ML SOLN [id number] Yes Take 3 mLs by nebulization every 4 (four) hours as needed (wheezing, shortness of breath). Vicci Afton CROME, MD  Active   levETIRAcetam  (KEPPRA ) 1000 MG tablet [id number] Yes Take 1 tablet (1,000 mg total) by mouth 2 (two) times daily. Gayland Lauraine PARAS, NP  Active Spouse/Significant Other  losartan  (COZAAR ) 50 MG tablet [id number] Yes Take 1 tablet (50 mg total) by mouth daily. Briana Elgin LABOR, MD  Active Spouse/Significant Other  mirtazapine (REMERON) 7.5 MG tablet [id number] Yes Take 7.5 mg by mouth at bedtime. [provider]  Active Spouse/Significant Other  NOVOLOG  FLEXPEN 100 UNIT/ML FlexPen [id number] Yes Please use the hospital sliding scale provided for you Vonzell Loving, MD  Active Spouse/Significant Other           Med Note BLASE, DANA K   Mon Aug 31, 2023  8:21 PM)    oxyCODONE  (OXY IR/ROXICODONE ) 5 MG immediate release tablet [id number] Yes Take 1 tablet (5 mg total) by mouth every 6 (six) hours as needed for severe pain (pain score 7-10). Franchot Novel, MD  Active Spouse/Significant Other  polyethylene glycol (MIRALAX ) 17 g packet [id number] Yes Take 17 g by mouth daily as needed for mild constipation or moderate constipation. Vicci Afton CROME, MD  Active   potassium chloride  (KLOR-CON ) 10 MEQ tablet [id number] Yes Take 10 mEq by mouth daily. [provider]  Active Spouse/Significant Other  rosuvastatin  (CRESTOR ) 20 MG tablet [id number] Yes Take 20 mg by mouth daily. [provider]  Active Spouse/Significant Other  senna (SENOKOT) 8.6 MG TABS tablet [id number] Yes Take 1 tablet (8.6 mg total) by mouth daily as needed for mild constipation or moderate constipation. Vicci Afton CROME, MD  Active   sulfamethoxazole-trimethoprim (BACTRIM DS) 800-160 MG  tablet [id number] Yes Take 1 tablet by mouth 2 (two) times daily. [provider]  Active   varenicline  (CHANTIX ) 1 MG tablet [id number] Yes Take 0.5 tablets (0.5 mg total) by mouth daily for 3 days, THEN 0.5 tablets (0.5 mg total) 2 (two) times daily for 4 days, THEN 1 tablet (1 mg total) 2 (two) times daily. Franchot Novel, MD  Active Spouse/Significant Other            Recommendation:   Continue Current Plan of Care  Follow Up Plan:   Telephone follow-up in 1 week  Railee Bonillas J. Adya Wirz RN, MSN [facility], [facility] Health RN Care Manager Direct Dial: [phone]  Fax: [phone] Website: [email or web address]

## 2023-09-29 ENCOUNTER — Telehealth: Payer: Self-pay

## 2023-09-30 ENCOUNTER — Telehealth: Payer: Self-pay

## 2023-09-30 DIAGNOSIS — J441 Chronic obstructive pulmonary disease with (acute) exacerbation: Secondary | ICD-10-CM | POA: Diagnosis not present

## 2023-09-30 DIAGNOSIS — J181 Lobar pneumonia, unspecified organism: Secondary | ICD-10-CM | POA: Diagnosis not present

## 2023-09-30 DIAGNOSIS — E119 Type 2 diabetes mellitus without complications: Secondary | ICD-10-CM | POA: Diagnosis not present

## 2023-09-30 DIAGNOSIS — S72091D Other fracture of head and neck of right femur, subsequent encounter for closed fracture with routine healing: Secondary | ICD-10-CM | POA: Diagnosis not present

## 2023-09-30 DIAGNOSIS — I119 Hypertensive heart disease without heart failure: Secondary | ICD-10-CM | POA: Diagnosis not present

## 2023-09-30 DIAGNOSIS — Z96641 Presence of right artificial hip joint: Secondary | ICD-10-CM | POA: Diagnosis not present

## 2023-09-30 DIAGNOSIS — J440 Chronic obstructive pulmonary disease with acute lower respiratory infection: Secondary | ICD-10-CM | POA: Diagnosis not present

## 2023-09-30 DIAGNOSIS — J9611 Chronic respiratory failure with hypoxia: Secondary | ICD-10-CM | POA: Diagnosis not present

## 2023-09-30 DIAGNOSIS — M47816 Spondylosis without myelopathy or radiculopathy, lumbar region: Secondary | ICD-10-CM | POA: Diagnosis not present

## 2023-09-30 NOTE — Patient Instructions (Signed)
 Visit Information  Thank you for taking time to visit with me today. Please don't hesitate to contact me if I can be of assistance to you before our next scheduled telephone appointment.  Our next appointment is by telephone on 10/05/23 at 0930  Following is a copy of your care plan:   Goals Addressed             This Visit's Progress    VBCI Transitions of Care (TOC) Care Plan       Problems:  Recent Hospitalization for treatment of Falls Knowledge Deficit Related to falls prevention  Goal:  Over the next 30 days, the patient will not experience hospital readmission  Interventions:  Transitions of Care: Doctor Visits  - discussed the importance of doctor visits Reviewed Signs and symptoms of infection  Patient has some history of dementia. Spoke with patient, she states she is doing well. Therapy last visit today.  Patient has recent right hip fracture repair.  Right hip healed  Left wrist fracture from most recent fall.  Left wrist with brace.  Orthopedic follow up visit went well per patient and follow up  10/14/23.  Falls Interventions: Reviewed medications and discussed potential side effects of medications such as dizziness and frequent urination Reiterated education on falls and falls prevention Keeping moving and stay active Keep your bones strong Go for regular eye checkups Always stand up slowly Wear proper non-slip footwear Light up your living space Use assistive devices.   Patient Self Care Activities:  Attend all scheduled provider appointments Call provider office for new concerns or questions  Notify RN Care Manager of TOC call rescheduling needs Participate in Transition of Care Program/Attend TOC scheduled calls Take medications as prescribed   Use Assistive devices when ambulating  Plan:  Telephone follow up appointment with care management team member scheduled for:  10/05/23 @ 930 am        Patient verbalizes understanding of instructions and  care plan provided today and agrees to view in MyChart. Active MyChart status and patient understanding of how to access instructions and care plan via MyChart confirmed with patient.     The patient has been provided with contact information for the care management team and has been advised to call with any health related questions or concerns.   Please call the care guide team at [phone] if you need to cancel or reschedule your appointment.   Please call the Suicide and Crisis Lifeline: 988 if you are experiencing a Mental Health or Behavioral Health Crisis or need someone to talk to.  Soraiya Ahner J. Donovin Kraemer RN, MSN [facility], [facility] Health RN Care Manager Direct Dial: [phone]  Fax: [phone] Website: [email or web address]

## 2023-10-05 ENCOUNTER — Telehealth: Payer: Self-pay

## 2023-10-14 ENCOUNTER — Encounter: Admitting: Orthopedic Surgery

## 2023-10-20 ENCOUNTER — Encounter: Admitting: Orthopedic Surgery

## 2023-10-23 ENCOUNTER — Other Ambulatory Visit (INDEPENDENT_AMBULATORY_CARE_PROVIDER_SITE_OTHER): Payer: Self-pay

## 2023-10-23 ENCOUNTER — Ambulatory Visit (INDEPENDENT_AMBULATORY_CARE_PROVIDER_SITE_OTHER): Admitting: Orthopedic Surgery

## 2023-10-23 VITALS — BP 143/75 | HR 73

## 2023-10-23 DIAGNOSIS — M25532 Pain in left wrist: Secondary | ICD-10-CM

## 2023-10-23 DIAGNOSIS — M25551 Pain in right hip: Secondary | ICD-10-CM

## 2023-10-23 DIAGNOSIS — S52615D Nondisplaced fracture of left ulna styloid process, subsequent encounter for closed fracture with routine healing: Secondary | ICD-10-CM

## 2023-10-23 DIAGNOSIS — S72001A Fracture of unspecified part of neck of right femur, initial encounter for closed fracture: Secondary | ICD-10-CM | POA: Diagnosis not present

## 2023-10-23 NOTE — Progress Notes (Signed)
 Orthopaedic Postop Note  Assessment: Veronica Moses is a 68 y.o. female s/p right hip hemiarthroplasty, anterior approach; left ulnar styloid fracture  DOS: 08/20/2023  Plan: Veronica Moses is doing very well.  Radiographs of both the right hip, as well as the left wrist remain stable.  She is stopped using a brace on her left wrist.  She is no longer using an assistive device for ambulation.  She has a steady gait overall.  She notes that she has occasional pain in the right hip.  Her surgical incision looks great.  I am very happy with her improvements.  Anticipate gradual improvement in her symptoms overall.  If she has any further issues, I have asked her to return to clinic.  Otherwise follow-up as needed.   Follow-up: Return if symptoms worsen or fail to improve. XR at next visit: Right hip and left wrist  Subjective:  Chief Complaint  Patient presents with   Follow-up    4 week follow up- states is feeling better.    History of Present Illness: Veronica Moses is a 68 y.o. female who presents following the above stated procedure.  Surgery was approximately 2 months ago.  She has returned home.  Her health has remained stable.  She is no longer using an assistive device.  Occasionally, she also pains in the right hip.  She has stopped using a brace on her left wrist.  No numbness or tingling.  She is doing well overall.   Review of Systems: No fevers or chills No numbness or tingling No Chest Pain No shortness of breath   Objective: BP (!) 143/75   Pulse 73   Physical Exam:  Alert and oriented.  No acute distress.  Stable gait without an assistive device.  Left wrist without deformity.  Minimal tenderness to palpation over the ulnar styloid.  No pain with ulnar deviation.  Fingers warm and well-perfused.  Sensation intact throughout the left hand.  Anterior based surgical incision over the right hip is healed.  No surrounding erythema or drainage.  Sensation is intact over the  anterior lateral thigh.  She is able to maintain a straight leg raise.  She tolerates gentle range of motion.  She has good quadricep strength.  Active motion intact in EHL/TA.  IMAGING: I personally ordered and reviewed the following images:   AP pelvis and right hip x-rays were obtained in clinic today.  These are compared to prior x-rays.  Well-positioned right hip hemiarthroplasty remains in stable alignment.  No acute fractures.  No subsidence.  The hip is reduced.  No bony lesions.  Impression: Stable right hip hemiarthroplasty without subsidence   X-rays of the left wrist were obtained in clinic today.  No acute injuries noted.  Small, minimally displaced fracture of the ulnar styloid.  This is not change in overall alignment.  There is no bony callus formation.  No interval displacement.  No additional injuries.  No bony lesions.  Impression: Stable left ulnar styloid fracture, minimally displaced  Oneil DELENA Horde, MD 10/23/2023 10:19 AM

## 2023-10-24 ENCOUNTER — Encounter: Payer: Self-pay | Admitting: Orthopedic Surgery

## 2023-11-19 DIAGNOSIS — E1165 Type 2 diabetes mellitus with hyperglycemia: Secondary | ICD-10-CM | POA: Diagnosis not present

## 2023-12-16 ENCOUNTER — Other Ambulatory Visit: Payer: Self-pay | Admitting: Neurology

## 2023-12-17 ENCOUNTER — Encounter: Payer: Self-pay | Admitting: Neurology

## 2023-12-17 ENCOUNTER — Ambulatory Visit (INDEPENDENT_AMBULATORY_CARE_PROVIDER_SITE_OTHER): Admitting: Neurology

## 2023-12-17 VITALS — BP 153/76 | Ht 61.0 in | Wt 144.0 lb

## 2023-12-17 DIAGNOSIS — G301 Alzheimer's disease with late onset: Secondary | ICD-10-CM

## 2023-12-17 DIAGNOSIS — Z5181 Encounter for therapeutic drug level monitoring: Secondary | ICD-10-CM | POA: Diagnosis not present

## 2023-12-17 DIAGNOSIS — G40909 Epilepsy, unspecified, not intractable, without status epilepticus: Secondary | ICD-10-CM | POA: Diagnosis not present

## 2023-12-17 DIAGNOSIS — F02A0 Dementia in other diseases classified elsewhere, mild, without behavioral disturbance, psychotic disturbance, mood disturbance, and anxiety: Secondary | ICD-10-CM

## 2023-12-17 MED ORDER — DONEPEZIL HCL 10 MG PO TABS: 10.0000 mg | ORAL_TABLET | Freq: Every day | ORAL | 3 refills | Status: AC

## 2023-12-17 MED ORDER — MIRTAZAPINE 15 MG PO TABS: 15.0000 mg | ORAL_TABLET | Freq: Every day | ORAL | 0 refills | Status: DC

## 2023-12-17 NOTE — Progress Notes (Signed)
 Patient: Veronica Moses Date of Birth: March 22, 1955  Reason for Visit: Follow up History from: Patient, daughter  Primary Neurologist: Usher Hedberg   ASSESSMENT AND PLAN 68 y.o. year old female   1.  Seizure disorder 2.  Mild Alzheimer Dementia   -Continue Aricept  10 mg nightly  -Consider Namenda in the future -Continue Keppra  1000 mg twice daily for seizure prevention  -Continue Aspirin  81 mg daily, strict management of vascular risk factors with a goal BP less than 130/90, A1c less than 7.0, LDL less than 70 for secondary stroke prevention. Recommend smoking cessation. -May consider starting Aricept  for memory  -Follow up in a year or sooner if worse    INTERVAL HISTORY 12/17/2023 Patient presents today for follow-up, she is accompanied by her husband.  Last visit was a in March, at that time we obtained ATN profile which was positive for Alzheimer disease biomarker.  She was started on Aricept , currently taking 10 mg nightly.  Since her last visit, she had multiple falls resulting in broken hip and broken wrist.  Currently her main complaint is right hip pain.   In terms of her seizures, she denies any seizure or seizure activity.  Tells me that her last seizure was in 2022.  Her memory is good not getting worse, she is compliant with her Aricept  no other complaints or concerns.    Addendum 06/10/23 SS: Labs from office visit 06/04/2023 showed B12 491, TSH 1.940, Keppra  level 53.5, ATN profile A+T+N-.  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  Will start Aricept  5 mg daily for 1 month, then 10 mg daily.  Reviewed side effects, seizure history is stable. I spoke with her daughter. Can add Namenda in a few months, call me and I will order.   MRI Brain Dec 2024 IMPRESSION: 1. No evidence of an acute intracranial abnormality. 2. Tiny chronic cortical infarct within the right middle frontal gyrus, not definitively present on the prior motion degraded brain MRI  of 10/30/2016. 3. Moderate for age multifocal T2 FLAIR hyperintense signal abnormality within the cerebral white matter, nonspecific but most often secondary to chronic small vessel ischemia. 4. Chronic lacunar infarcts within the central pons, new from prior MRI. 5. Mild-to-moderate generalized cerebral atrophy.   HISTORY OF PRESENT ILLNESS: At last visit Keppra  level was 74. Remains on Keppra  1000 mg twice daily. Last seizure was in 2022. Would like to discuss MRI that PCP ordered. Was done for memory loss. Present since 2017. Memory loss worsening, trouble with short term. Depends on her husband, needs help with bathing. Cannot cook, doesn't drive, or do any household chores. Spends most of the time watching TV, watches dirty dancing over and over. Was started on Aricept  5 mg, after MRI was stopped, unclear why. She smokes 1 pack a day. Stays home, doesn't attend family functions. No falls, sometimes feels dizzy. Can be irritable. Husband manages the medications. MOCA 15/30 today.   HISTORY  06/04/22 Dr. Gregg: This is a 68 year old woman with past medical history of hypertension, hyperlipidemia, diabetes mellitus, seizure disorder in the setting of [place] spotted fever, anxiety who is presenting to establish care.  Patient is accompanied by her husband who provided most of the history.  She was doing fine until 2017 when she was admitted at [facility] spotted fever, at that time she was also noted to have seizures.  Husband reported after discharge from the hospital 30 days later she did well she was  initially on Tegretol and then Depakote  but had hair loss while on Depakote  therefore switched to levetiracetam .  She was doing well until 2022 when she had a breakthrough seizure in the setting of medication nonadherence.  She was taking the medication wrong.  Since then patient has been doing well, she is on Keppra  1000 mg twice daily, denies any side effect from the medication and no  seizures.  Overall she is stable.  She needs a new neurologist as her previous neurologist has moved away. Currently no complaints or concerns.     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  See HPI  ALLERGIES: Allergies  Allergen Reactions   Demerol Other (See Comments)    hallucinations   Depakote  [Divalproex  Sodium] Other (See Comments)    Sore/blisters   Dilaudid  [Hydromorphone  Hcl] Nausea And Vomiting   Doxycycline  Other (See Comments)    Blistered   Valproic  Acid Rash    HOME MEDICATIONS: Outpatient Medications Prior to Visit  Medication Sig Dispense Refill   acetaminophen  (TYLENOL ) 325 MG tablet Take 325 mg by mouth every 6 (six) hours as needed for mild pain or moderate pain.     albuterol  (VENTOLIN  HFA) 108 (90 Base) MCG/ACT inhaler Inhale 2 puffs into the lungs every 6 (six) hours as needed for wheezing or shortness of breath. 8 g 2   alendronate (FOSAMAX) 70 MG tablet Take 70 mg by mouth every Saturday.     ALPRAZolam  (XANAX ) 0.5 MG tablet Take 0.5 mg by mouth See admin instructions. Take 1 tablet if needed daily and 2 tablets every evening     glipiZIDE (GLUCOTROL XL) 5 MG 24 hr tablet Take 5 mg by mouth daily with breakfast.     insulin  glargine (LANTUS ) 100 UNIT/ML Solostar Pen Inject 20 Units into the skin daily.      ipratropium-albuterol  (DUONEB) 0.5-2.5 (3) MG/3ML SOLN Take 3 mLs by nebulization every 4 (four) hours as needed (wheezing, shortness of breath). 360 mL 1   levETIRAcetam  (KEPPRA ) 1000 MG tablet Take 1 tablet (1,000 mg total) by mouth 2 (two) times daily. 180 tablet 3   NOVOLOG  FLEXPEN 100 UNIT/ML FlexPen Please use the hospital sliding scale provided for you 15 mL 12   potassium chloride  (KLOR-CON ) 10 MEQ tablet Take 10 mEq by mouth daily.     rosuvastatin  (CRESTOR ) 20 MG tablet Take 20 mg by mouth daily.     donepezil  (ARICEPT ) 10 MG tablet Take 1 tablet (10 mg total) by mouth at bedtime. 30 tablet 5   mirtazapine (REMERON) 7.5 MG tablet Take 7.5 mg by mouth at bedtime.     dextromethorphan  (DELSYM ) 30 MG/5ML liquid Take 5 mLs (30 mg total) by mouth 2 (two) times daily as needed for cough. (Patient not taking: Reported on 12/17/2023) 89 mL 0   escitalopram  (  LEXAPRO ) 20 MG tablet Take 20 mg by mouth daily. (Patient not taking: Reported on 12/17/2023)     ferrous sulfate  325 (65 FE) MG tablet Take 1 tablet (325 mg total) by mouth daily with breakfast. (Patient not taking: Reported on 12/17/2023)     gabapentin  (NEURONTIN ) 300 MG capsule Take 1 capsule by mouth daily. (Patient not taking: Reported on 12/17/2023)     losartan  (COZAAR ) 50 MG tablet Take 1 tablet (50 mg total) by mouth daily. (Patient not taking: Reported on 12/17/2023)     oxyCODONE  (OXY IR/ROXICODONE ) 5 MG immediate release tablet Take 1 tablet (5 mg total) by mouth every 6 (six) hours as needed for severe pain (pain score 7-10). (Patient not taking: Reported on 12/17/2023) 30 tablet 0   polyethylene glycol (MIRALAX ) 17 g packet Take 17 g by mouth daily as needed for mild constipation or moderate constipation. (Patient not taking: Reported on 12/17/2023)     senna (SENOKOT) 8.6 MG TABS tablet Take 1 tablet (8.6 mg total) by mouth daily as needed for mild constipation or moderate constipation. (Patient not taking: Reported on 12/17/2023)      sulfamethoxazole-trimethoprim (BACTRIM DS) 800-160 MG tablet Take 1 tablet by mouth 2 (two) times daily. (Patient not taking: Reported on 12/17/2023)     No facility-administered medications prior to visit.    PAST MEDICAL HISTORY: Past Medical History:  Diagnosis Date   Anxiety    Closed fracture of right distal radius    COPD (chronic obstructive pulmonary disease) (HCC)    Depression    Diabetes mellitus    Insulin  Dependent. Adult onset   Dyspnea    with exertion   Heart murmur    Hypertension    Memory changes    since being on vent June 2017- coming back some.   Seizures (HCC) 08/2015   last seizure 07-2019   Stroke [facility]) 2016   remote, no deficits   UTI (lower urinary tract infection)     PAST SURGICAL HISTORY: Past Surgical History:  Procedure Laterality Date   ABDOMINAL HYSTERECTOMY     BREAST SURGERY Left    cyst-   CHOLECYSTECTOMY     COLONOSCOPY WITH ESOPHAGOGASTRODUODENOSCOPY (EGD) N/A 03/04/2013   Procedure: COLONOSCOPY WITH ESOPHAGOGASTRODUODENOSCOPY (EGD);  Surgeon: Claudis RAYMOND Rivet, MD;  Location: [facility];  Service: Endoscopy;  Laterality: N/A;  1200-moved to 1535 Ann notified pt   HEMIARTHROPLASTY (BIPOLAR), HIP, DIRECT ANTERIOR APPROACH, FOR FRACTURE Right 08/20/2023   Procedure: HEMIARTHROPLASTY (BIPOLAR), HIP, DIRECT ANTERIOR APPROACH, FOR FRACTURE;  Surgeon: Onesimo Oneil LABOR, MD;  Location: [facility] ORS;  Service: Orthopedics;  Laterality: Right;   MULTIPLE EXTRACTIONS WITH ALVEOLOPLASTY N/A 05/30/2016   Procedure: MULTIPLE EXTRACTION WITH ALVEOLOPLASTY;  Surgeon: Glendia Primrose, DDS;  Location: [facility] OR;  Service: Oral Surgery;  Laterality: N/A;   NEPHRECTOMY Left    Donatated to brother   OPEN REDUCTION INTERNAL FIXATION (ORIF) DISTAL RADIAL FRACTURE Right 03/22/2020   Procedure: OPEN REDUCTION INTERNAL FIXATION (ORIF) DISTAL RADIAL FRACTURE;  Surgeon: Murrell Kuba, MD;  Location: [facility];  Service: Orthopedics;  Laterality: Right;  AXILLARY  BLOCK    FAMILY HISTORY: Family History  Problem Relation Age of Onset   Diabetes Father    CAD Other        family history   Diabetes Brother    Colon cancer Neg Hx     SOCIAL HISTORY: Social History   Socioeconomic History   Marital status: Married    Spouse name: Not on file  Number of children: Not on file   Years of education: Not on file   Highest education level: Not on file  Occupational History   Not on file  Tobacco Use   Smoking status: Former    Current packs/day: 0.00    Average packs/day: 1 pack/day for 26.0 years (26.0 ttl pk-yrs)    Types: Cigarettes    Start date: 08/26/1989    Quit date: 08/27/2015    Years since quitting: 8.3   Smokeless tobacco: Never   Tobacco comments:    smoked this am    Vaping Use   Vaping status: Never Used  Substance and Sexual Activity   Alcohol use: No   Drug use: No   Sexual activity: Not Currently    Birth control/protection: Surgical  Other Topics Concern   Not on file  Social History Narrative   Not on file   Social Drivers of Health   Financial Resource Strain: Not on file  Food Insecurity: No Food Insecurity (09/07/2023)   Hunger Vital Sign    Worried About Running Out of Food in the Last Year: Never true    Ran Out of Food in the Last Year: Never true  Transportation Needs: No Transportation Needs (09/07/2023)   PRAPARE - Administrator, Civil Service (Medical): No    Lack of Transportation (Non-Medical): No  Physical Activity: Not on file  Stress: Not on file  Social Connections: Moderately Isolated (08/31/2023)   Social Connection and Isolation Panel    Frequency of Communication with Friends and Family: More than three times a week    Frequency of Social Gatherings with Friends and Family: More than three times a week    Attends Religious Services: Never    Database administrator or Organizations: No    Attends Banker Meetings: Never    Marital Status: Married  Careers information officer Violence: Not At Risk (09/07/2023)   Humiliation, Afraid, Rape, and Kick questionnaire    Fear of Current or Ex-Partner: No    Emotionally Abused: No    Physically Abused: No    Sexually Abused: No    PHYSICAL EXAM  Vitals:   12/17/23 1143  BP: (!) 153/76  Weight: 144 lb (65.3 kg)  Height: 5' 1 (1.549 m)   Body mass index is 27.21 kg/m.    12/17/2023   11:44 AM 06/04/2023   11:23 AM  Montreal Cognitive Assessment   Visuospatial/ Executive (0/5) 3 3  Naming (0/3) 3 3  Attention: Read list of digits (0/2) 2 2  Attention: Read list of letters (0/1) 0 1  Attention: Serial 7 subtraction starting at 100 (0/3) 0 0  Language: Repeat phrase (0/2) 1 1  Language : Fluency (0/1) 0 0  Abstraction (0/2) 1 1  Delayed Recall (0/5) 2 0  Orientation (0/6) 3 3  Total 15 14  Adjusted Score (based on education)  15   Generalized: Well developed, in no acute distress  Neurological examination  Mentation: Alert oriented, perseverate on her hip pain follows exam commands Cranial nerve II-XII: Pupils were equal round reactive to light. Extraocular movements were full, visual field were full on confrontational test. Facial sensation and strength were normal. Head turning and shoulder shrug  were normal and symmetric. Motor: The motor testing reveals 5 over 5 strength of all 4 extremities. Good symmetric motor tone is noted throughout.  Sensory: Sensory testing is intact to soft touch on all 4 extremities. No evidence of extinction  is noted.  Coordination: Cerebellar testing reveals good finger-nose-finger Gait and station: Antalgic gait    DIAGNOSTIC DATA (LABS, IMAGING, TESTING) - I reviewed patient records, labs, notes, testing and imaging myself where available.  Lab Results  Component Value Date   WBC 12.3 (H) 09/02/2023   HGB 10.0 (L) 09/02/2023   HCT 31.5 (L) 09/02/2023   MCV 95.5 09/02/2023   PLT 318 09/02/2023      Component Value Date/Time   NA 141 09/02/2023 0427   K  3.1 (L) 09/02/2023 0427   CL 102 09/02/2023 0427   CO2 28 09/02/2023 0427   GLUCOSE 280 (H) 09/02/2023 0427   BUN 18 09/02/2023 0427   CREATININE 0.84 09/02/2023 0427   CALCIUM  8.9 09/02/2023 0427   PROT 6.1 (L) 09/01/2023 0139   ALBUMIN 2.8 (L) 09/01/2023 0139   AST 32 09/01/2023 0139   ALT 25 09/01/2023 0139   ALKPHOS 35 (L) 09/01/2023 0139   BILITOT 0.6 09/01/2023 0139   GFRNONAA >60 09/02/2023 0427   GFRAA >60 04/13/2019 1636   Lab Results  Component Value Date   CHOL 131 11/21/2016   HDL 51 11/21/2016   LDLCALC 74 11/21/2016   TRIG 29 11/21/2016   CHOLHDL 2.6 11/21/2016   Lab Results  Component Value Date   HGBA1C 8.9 (H) 08/03/2023   Lab Results  Component Value Date   VITAMINB12 491 06/04/2023   Lab Results  Component Value Date   TSH 1.940 06/04/2023     Pastor Falling, MD  12/17/2023, 2:07 PM [facility] [address] [place], [place] [address] [phone]

## 2023-12-19 ENCOUNTER — Ambulatory Visit: Payer: Self-pay | Admitting: Neurology

## 2023-12-19 LAB — LEVETIRACETAM LEVEL: Levetiracetam Lvl: 56.9 ug/mL — ABNORMAL HIGH (ref 10.0–40.0)

## 2023-12-19 LAB — VITAMIN D 25 HYDROXY (VIT D DEFICIENCY, FRACTURES): Vit D, 25-Hydroxy: 30.7 ng/mL (ref 30.0–100.0)

## 2023-12-22 ENCOUNTER — Ambulatory Visit (HOSPITAL_COMMUNITY)
Admission: RE | Admit: 2023-12-22 | Discharge: 2023-12-22 | Disposition: A | Discharge status: Home / Self Care | Source: Ambulatory Visit

## 2023-12-22 ENCOUNTER — Other Ambulatory Visit (HOSPITAL_COMMUNITY): Payer: Self-pay

## 2023-12-22 DIAGNOSIS — M25551 Pain in right hip: Secondary | ICD-10-CM

## 2023-12-22 DIAGNOSIS — Z471 Aftercare following joint replacement surgery: Secondary | ICD-10-CM | POA: Diagnosis not present

## 2024-01-04 DIAGNOSIS — I10 Essential (primary) hypertension: Secondary | ICD-10-CM | POA: Diagnosis not present

## 2024-01-20 ENCOUNTER — Other Ambulatory Visit: Payer: Self-pay | Admitting: Neurology

## 2024-01-21 NOTE — Telephone Encounter (Signed)
 Last seen on 12/17/23 Follow up scheduled on 12/15/24  I didn't see medication mentioned to continue in last office note.

## 2024-01-25 DIAGNOSIS — E1165 Type 2 diabetes mellitus with hyperglycemia: Secondary | ICD-10-CM | POA: Diagnosis not present

## 2024-01-25 DIAGNOSIS — I10 Essential (primary) hypertension: Secondary | ICD-10-CM | POA: Diagnosis not present

## 2024-02-01 DIAGNOSIS — E1142 Type 2 diabetes mellitus with diabetic polyneuropathy: Secondary | ICD-10-CM | POA: Diagnosis not present

## 2024-02-01 DIAGNOSIS — Z0000 Encounter for general adult medical examination without abnormal findings: Secondary | ICD-10-CM | POA: Diagnosis not present

## 2024-02-01 DIAGNOSIS — Z23 Encounter for immunization: Secondary | ICD-10-CM | POA: Diagnosis not present

## 2024-02-01 DIAGNOSIS — I10 Essential (primary) hypertension: Secondary | ICD-10-CM | POA: Diagnosis not present

## 2024-02-01 DIAGNOSIS — G894 Chronic pain syndrome: Secondary | ICD-10-CM | POA: Diagnosis not present

## 2024-02-01 DIAGNOSIS — E1165 Type 2 diabetes mellitus with hyperglycemia: Secondary | ICD-10-CM | POA: Diagnosis not present

## 2024-02-01 DIAGNOSIS — Z0001 Encounter for general adult medical examination with abnormal findings: Secondary | ICD-10-CM | POA: Diagnosis not present

## 2024-02-01 DIAGNOSIS — G4700 Insomnia, unspecified: Secondary | ICD-10-CM | POA: Diagnosis not present

## 2024-02-01 DIAGNOSIS — G40909 Epilepsy, unspecified, not intractable, without status epilepticus: Secondary | ICD-10-CM | POA: Diagnosis not present

## 2024-02-15 ENCOUNTER — Other Ambulatory Visit: Payer: Self-pay | Admitting: Neurology

## 2024-02-15 DIAGNOSIS — F331 Major depressive disorder, recurrent, moderate: Secondary | ICD-10-CM

## 2024-02-17 DIAGNOSIS — E1165 Type 2 diabetes mellitus with hyperglycemia: Secondary | ICD-10-CM | POA: Diagnosis not present

## 2024-03-29 ENCOUNTER — Other Ambulatory Visit: Payer: Self-pay | Admitting: Neurology

## 2024-03-29 DIAGNOSIS — F331 Major depressive disorder, recurrent, moderate: Secondary | ICD-10-CM

## 2024-04-19 ENCOUNTER — Other Ambulatory Visit: Payer: Self-pay | Admitting: Neurology

## 2024-04-19 DIAGNOSIS — F331 Major depressive disorder, recurrent, moderate: Secondary | ICD-10-CM

## 2024-12-15 ENCOUNTER — Ambulatory Visit: Admitting: Neurology
